# Patient Record
Sex: Male | Born: 1940
Health system: Southern US, Community
[De-identification: ages and names within clinical notes are randomized; demographics above are authoritative.]

## PROBLEM LIST (undated history)

## (undated) DIAGNOSIS — C801 Malignant (primary) neoplasm, unspecified: Secondary | ICD-10-CM

## (undated) DIAGNOSIS — R7303 Prediabetes: Secondary | ICD-10-CM

## (undated) DIAGNOSIS — F419 Anxiety disorder, unspecified: Secondary | ICD-10-CM

## (undated) DIAGNOSIS — I10 Essential (primary) hypertension: Secondary | ICD-10-CM

## (undated) DIAGNOSIS — G4730 Sleep apnea, unspecified: Secondary | ICD-10-CM

## (undated) DIAGNOSIS — F329 Major depressive disorder, single episode, unspecified: Secondary | ICD-10-CM

## (undated) DIAGNOSIS — F32A Depression, unspecified: Secondary | ICD-10-CM

## (undated) DIAGNOSIS — K279 Peptic ulcer, site unspecified, unspecified as acute or chronic, without hemorrhage or perforation: Secondary | ICD-10-CM

## (undated) DIAGNOSIS — J449 Chronic obstructive pulmonary disease, unspecified: Secondary | ICD-10-CM

## (undated) DIAGNOSIS — J309 Allergic rhinitis, unspecified: Secondary | ICD-10-CM

## (undated) DIAGNOSIS — K219 Gastro-esophageal reflux disease without esophagitis: Secondary | ICD-10-CM

## (undated) DIAGNOSIS — R0600 Dyspnea, unspecified: Secondary | ICD-10-CM

## (undated) HISTORY — PX: NASAL SEPTUM SURGERY: SHX37

## (undated) HISTORY — DX: Essential (primary) hypertension: I10

## (undated) HISTORY — DX: Allergic rhinitis, unspecified: J30.9

## (undated) HISTORY — PX: TONSILLECTOMY: SUR1361

## (undated) HISTORY — PX: CATARACT EXTRACTION, BILATERAL: SHX1313

## (undated) HISTORY — DX: Sleep apnea, unspecified: G47.30

## (undated) HISTORY — DX: Peptic ulcer, site unspecified, unspecified as acute or chronic, without hemorrhage or perforation: K27.9

## (undated) HISTORY — PX: COLONOSCOPY: SHX174

---
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
## 1999-04-12 ENCOUNTER — Ambulatory Visit (HOSPITAL_COMMUNITY): Admission: RE | Admit: 1999-04-12 | Discharge: 1999-04-12 | Payer: Self-pay | Admitting: Gastroenterology

## 1999-04-12 ENCOUNTER — Encounter (INDEPENDENT_AMBULATORY_CARE_PROVIDER_SITE_OTHER): Payer: Self-pay | Admitting: *Deleted

## 2000-08-13 ENCOUNTER — Encounter: Payer: Self-pay | Admitting: Allergy and Immunology

## 2000-08-13 ENCOUNTER — Encounter: Admission: RE | Admit: 2000-08-13 | Discharge: 2000-08-13 | Payer: Self-pay | Admitting: Allergy and Immunology

## 2001-02-06 ENCOUNTER — Encounter (INDEPENDENT_AMBULATORY_CARE_PROVIDER_SITE_OTHER): Payer: Self-pay | Admitting: *Deleted

## 2001-02-06 ENCOUNTER — Ambulatory Visit (HOSPITAL_COMMUNITY): Admission: RE | Admit: 2001-02-06 | Discharge: 2001-02-06 | Payer: Self-pay | Admitting: Gastroenterology

## 2001-03-19 ENCOUNTER — Encounter: Payer: Self-pay | Admitting: Allergy and Immunology

## 2001-03-19 ENCOUNTER — Encounter: Admission: RE | Admit: 2001-03-19 | Discharge: 2001-03-19 | Payer: Self-pay | Admitting: Allergy and Immunology

## 2003-11-30 ENCOUNTER — Emergency Department (HOSPITAL_COMMUNITY): Admission: EM | Admit: 2003-11-30 | Discharge: 2003-12-01 | Payer: Self-pay | Admitting: Emergency Medicine

## 2004-04-27 ENCOUNTER — Encounter (INDEPENDENT_AMBULATORY_CARE_PROVIDER_SITE_OTHER): Payer: Self-pay | Admitting: *Deleted

## 2004-04-27 ENCOUNTER — Ambulatory Visit (HOSPITAL_COMMUNITY): Admission: RE | Admit: 2004-04-27 | Discharge: 2004-04-27 | Payer: Self-pay | Admitting: Gastroenterology

## 2008-07-07 ENCOUNTER — Encounter: Admission: RE | Admit: 2008-07-07 | Discharge: 2008-07-07 | Payer: Self-pay | Admitting: Emergency Medicine

## 2008-07-07 IMAGING — CR DG CHEST 2V
3 series · 3 of 3 positions shown · non-contrast
Comparison: None

CLINICAL DATA: Cough, congestion, shortness of breath

CHEST - 2 VIEW

[view not recorded (1 of 3)]
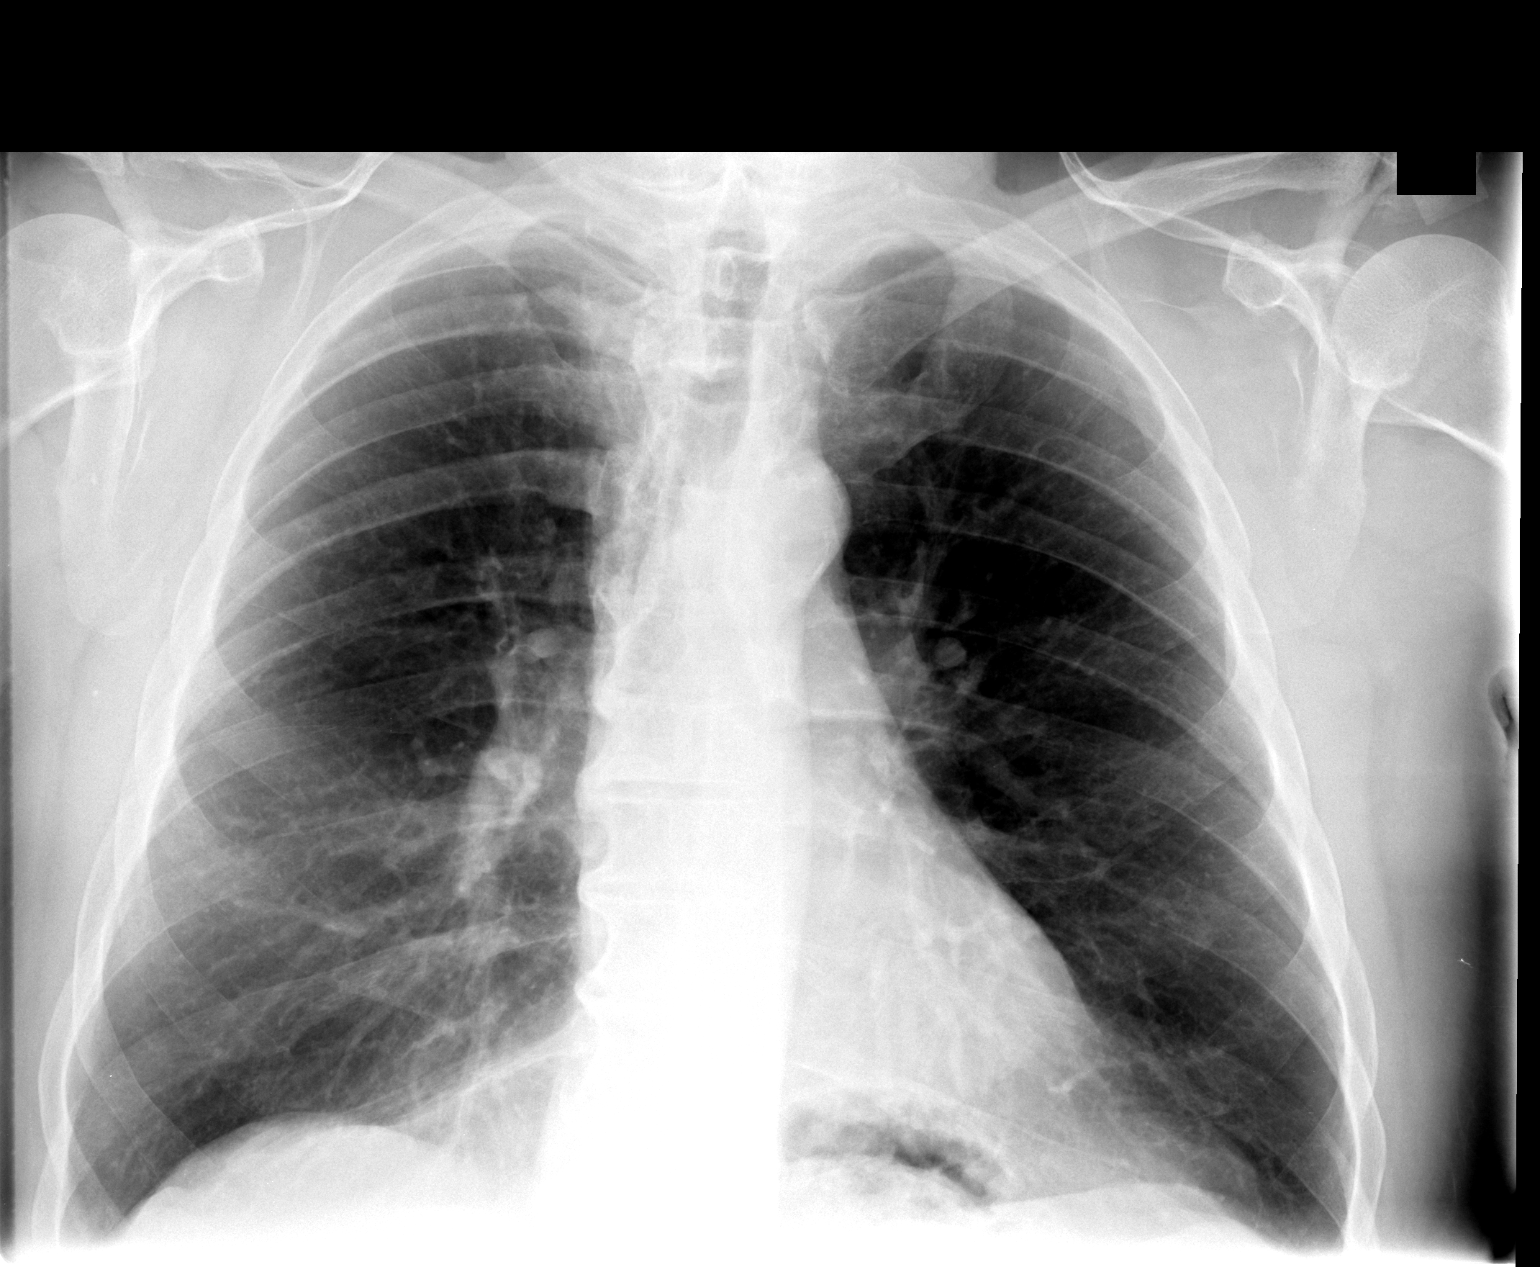

[view not recorded (2 of 3)]
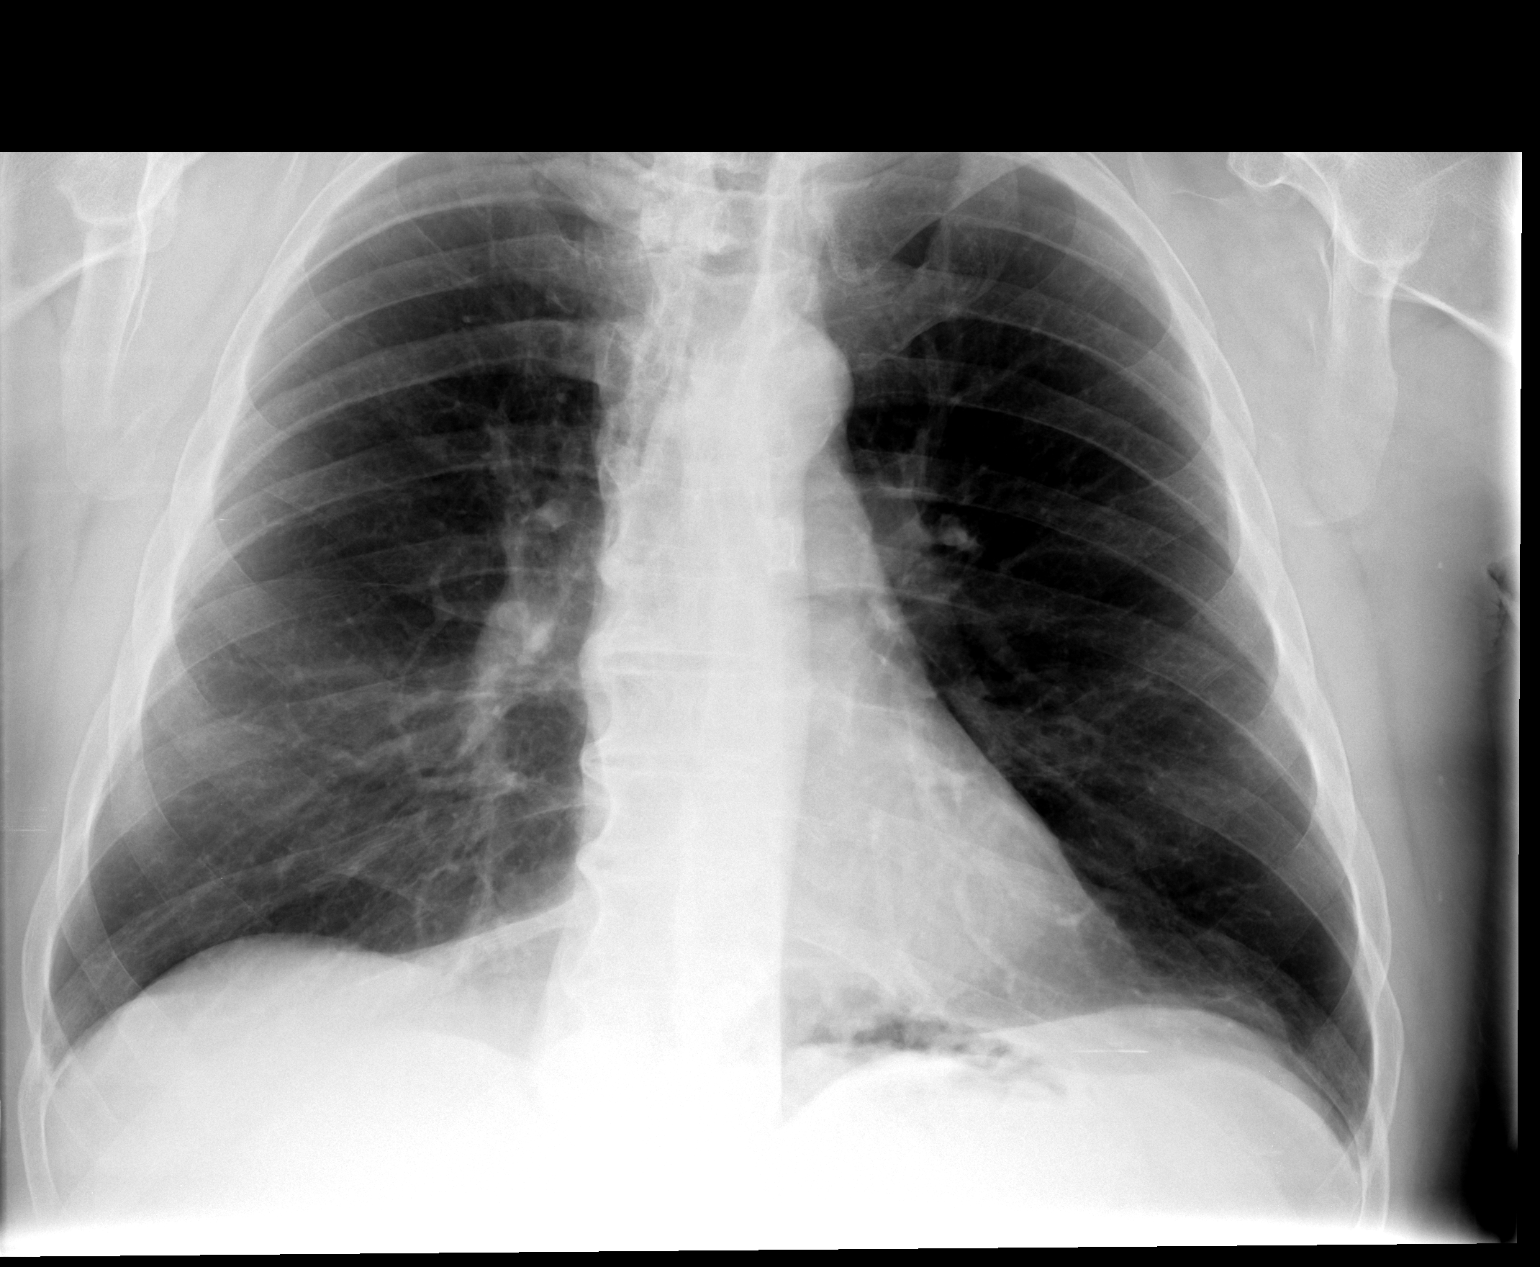

[view not recorded (3 of 3)]
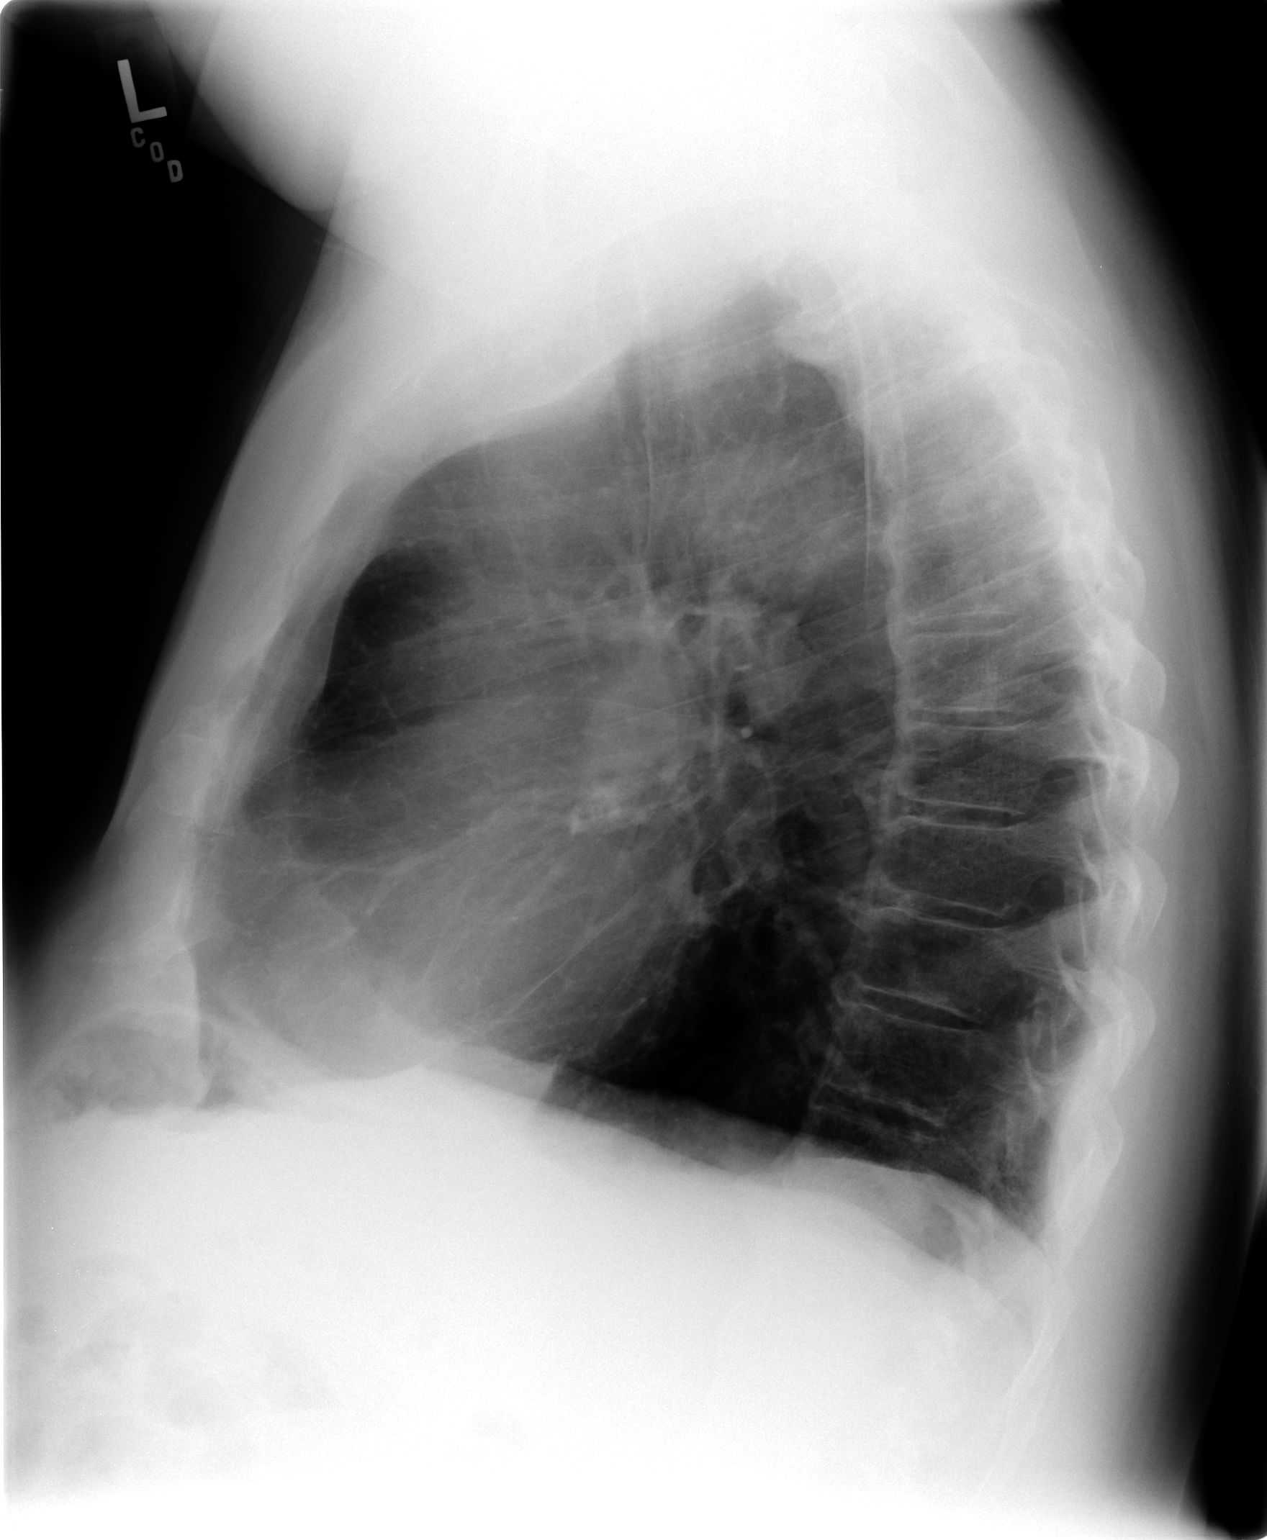

[3 of 3 positions shown; findings below may reference images not displayed]

FINDINGS: The lungs are clear but hyperaerated. The heart is within
normal limits in size. There are degenerative changes throughout
the thoracic spine.
IMPRESSION: No active lung disease. Hyperaeration.

## 2009-06-08 ENCOUNTER — Ambulatory Visit: Payer: Self-pay | Admitting: Internal Medicine

## 2009-06-08 DIAGNOSIS — J449 Chronic obstructive pulmonary disease, unspecified: Secondary | ICD-10-CM

## 2009-06-08 DIAGNOSIS — J309 Allergic rhinitis, unspecified: Secondary | ICD-10-CM | POA: Insufficient documentation

## 2009-06-08 DIAGNOSIS — I10 Essential (primary) hypertension: Secondary | ICD-10-CM | POA: Insufficient documentation

## 2009-06-08 IMAGING — CR DG CHEST 2V
2 series · 2 of 2 positions shown · non-contrast
Comparison: [DATE]

CLINICAL DATA: Asthma.  Cough, shortness of breath.

CHEST - 2 VIEW

[view not recorded (1 of 2)]
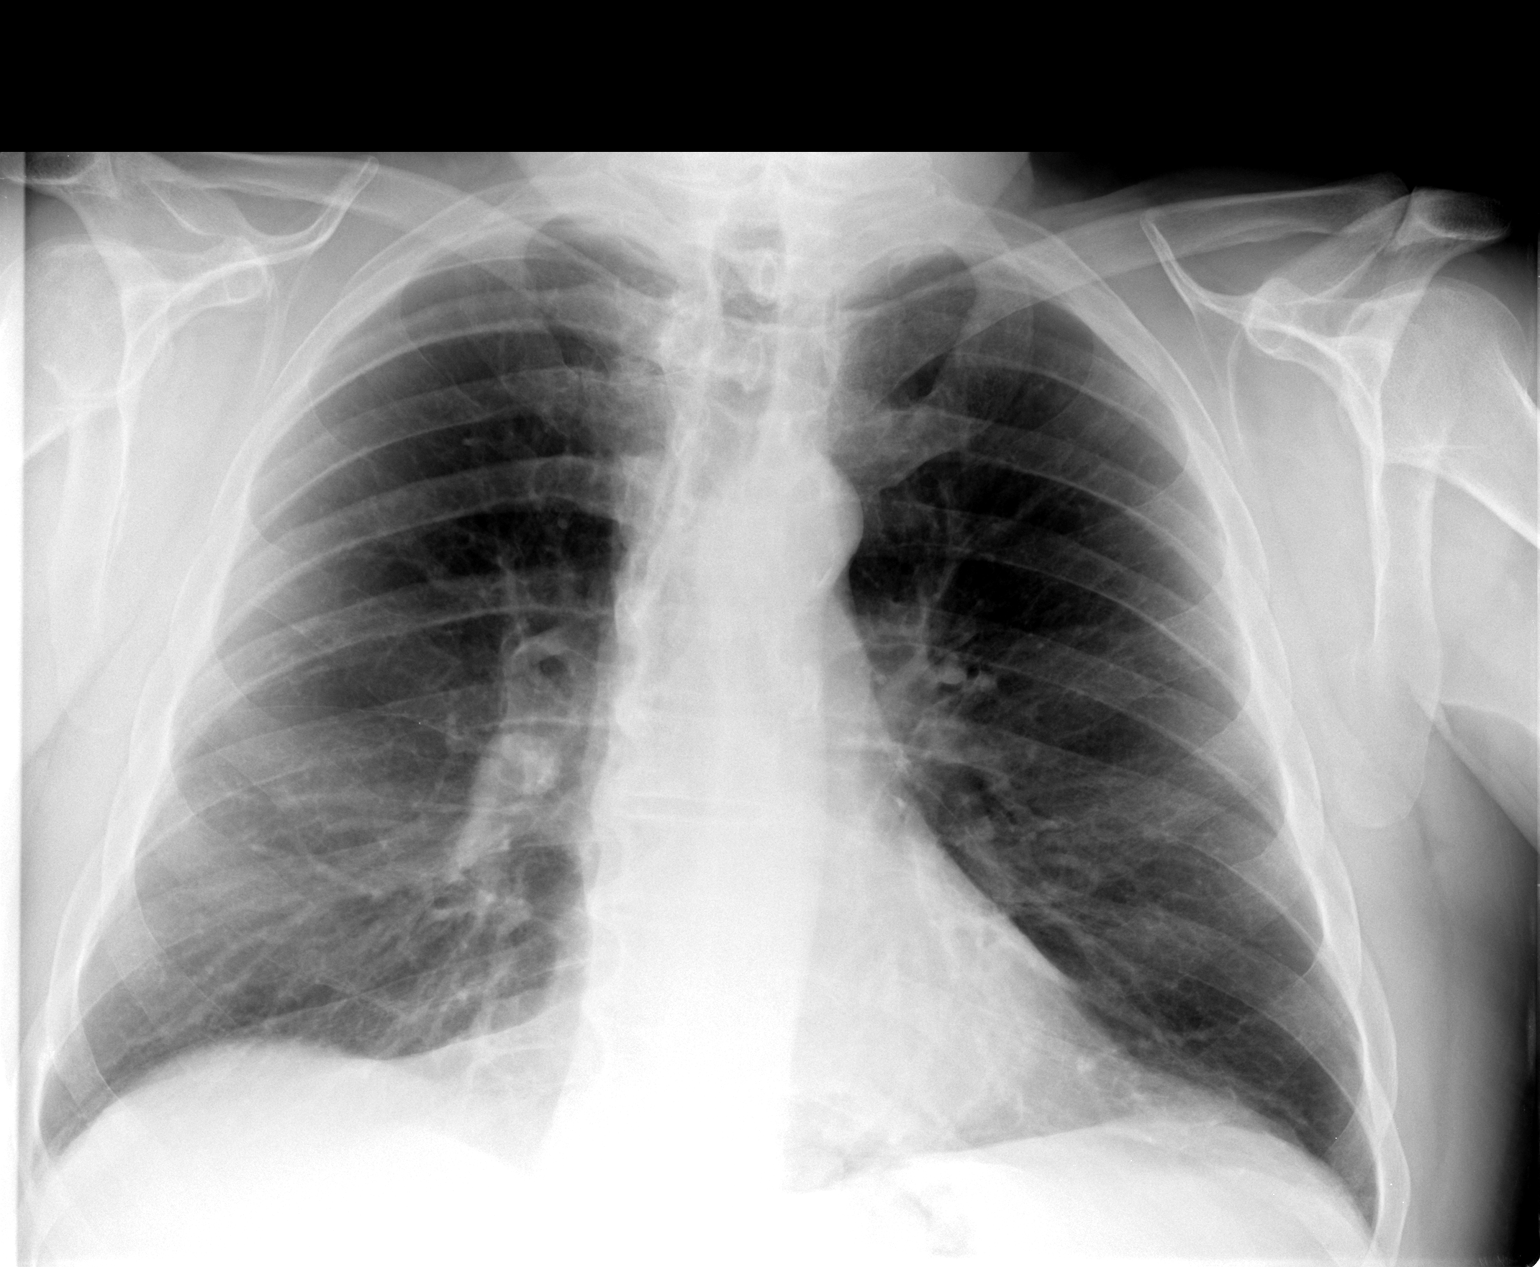

[view not recorded (2 of 2)]
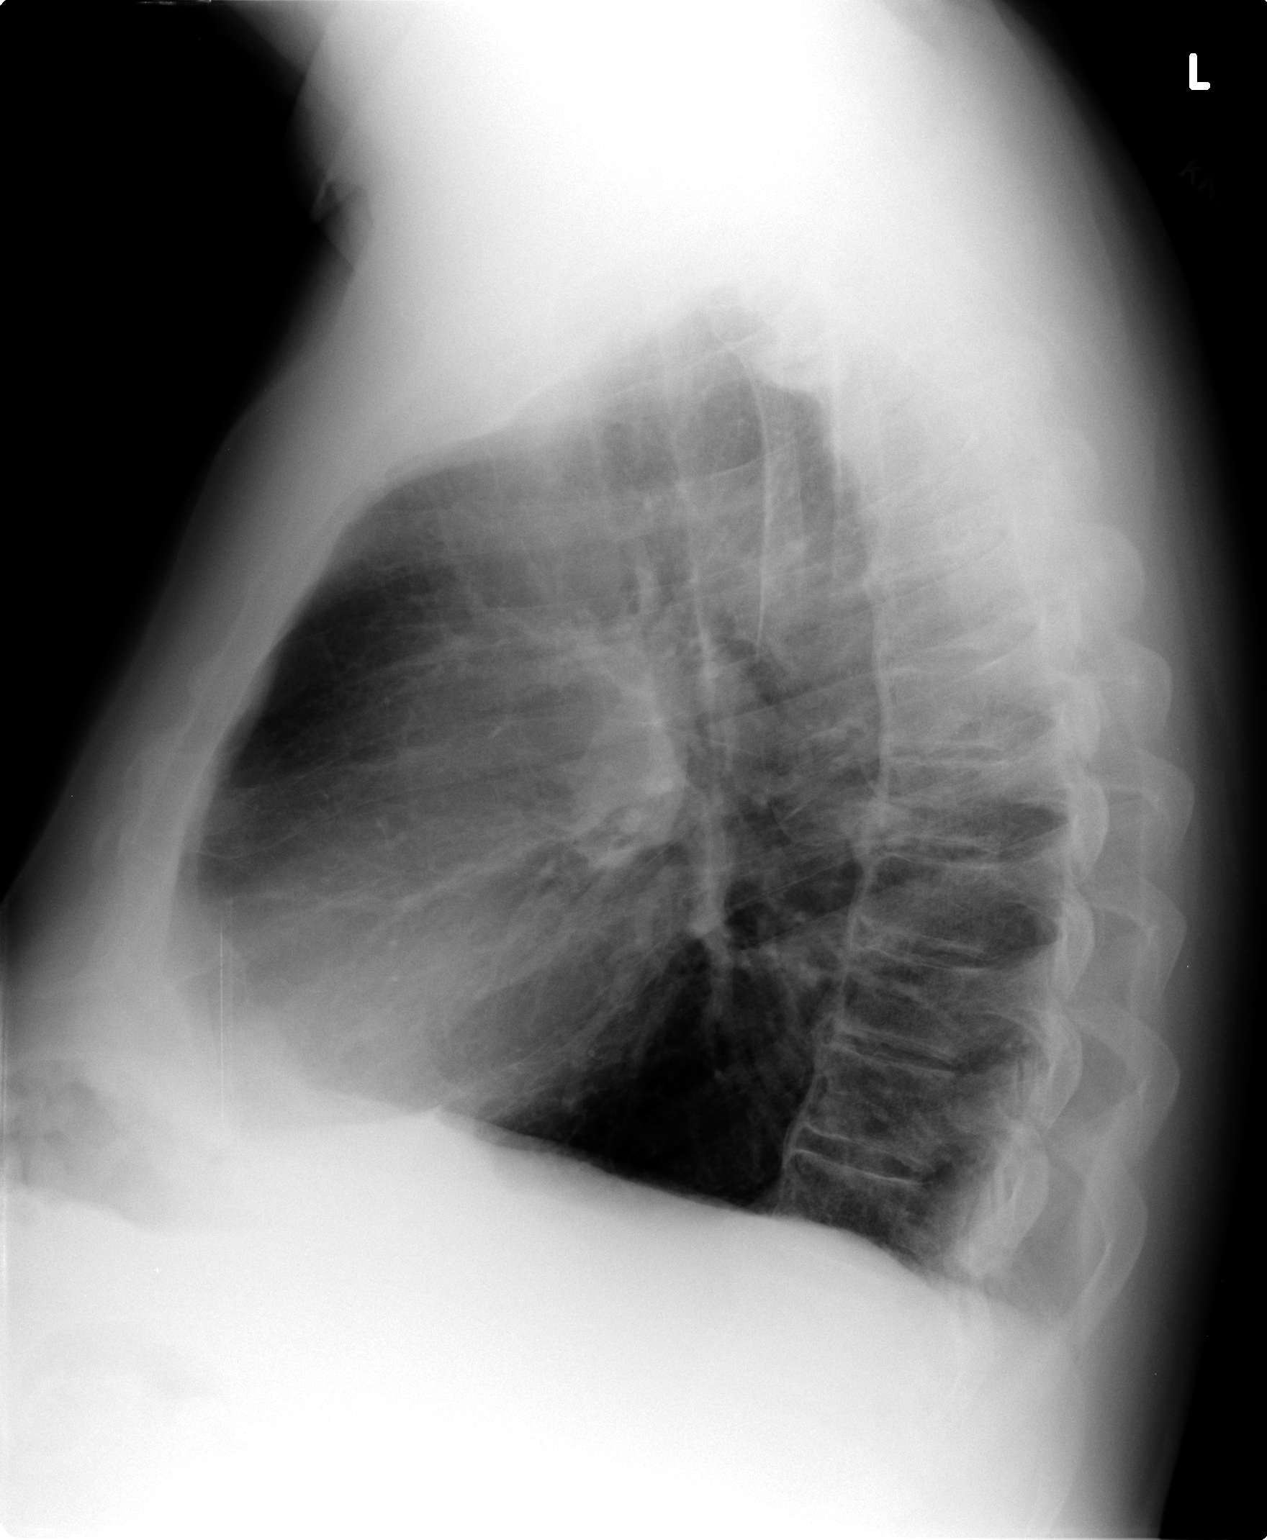

[2 of 2 positions shown; findings below may reference images not displayed]

FINDINGS: Heart and mediastinal contours are within normal limits.
No focal opacities or effusions.  No acute bony abnormality. Mild
hyperinflation, similar to prior study.
IMPRESSION: There is mild hyperinflation.  No acute findings.  No change.

## 2009-06-15 DIAGNOSIS — K279 Peptic ulcer, site unspecified, unspecified as acute or chronic, without hemorrhage or perforation: Secondary | ICD-10-CM | POA: Insufficient documentation

## 2009-07-13 ENCOUNTER — Ambulatory Visit: Payer: Self-pay | Admitting: Internal Medicine

## 2009-07-13 DIAGNOSIS — R0602 Shortness of breath: Secondary | ICD-10-CM | POA: Insufficient documentation

## 2009-07-20 DIAGNOSIS — J449 Chronic obstructive pulmonary disease, unspecified: Secondary | ICD-10-CM

## 2009-07-20 DIAGNOSIS — J4489 Other specified chronic obstructive pulmonary disease: Secondary | ICD-10-CM | POA: Insufficient documentation

## 2010-01-01 ENCOUNTER — Ambulatory Visit: Payer: Self-pay | Admitting: Internal Medicine

## 2010-01-01 DIAGNOSIS — F17201 Nicotine dependence, unspecified, in remission: Secondary | ICD-10-CM | POA: Insufficient documentation

## 2010-01-01 DIAGNOSIS — L509 Urticaria, unspecified: Secondary | ICD-10-CM | POA: Insufficient documentation

## 2010-03-15 ENCOUNTER — Encounter: Admission: RE | Admit: 2010-03-15 | Discharge: 2010-03-15 | Payer: Self-pay | Admitting: Family Medicine

## 2010-03-15 IMAGING — CR DG CHEST 2V
2 series · 2 of 2 positions shown · non-contrast
Comparison: Chest x-ray of [DATE]

CLINICAL DATA: Cough for several months, smoking history

CHEST - 2 VIEW

[view not recorded (1 of 2)]
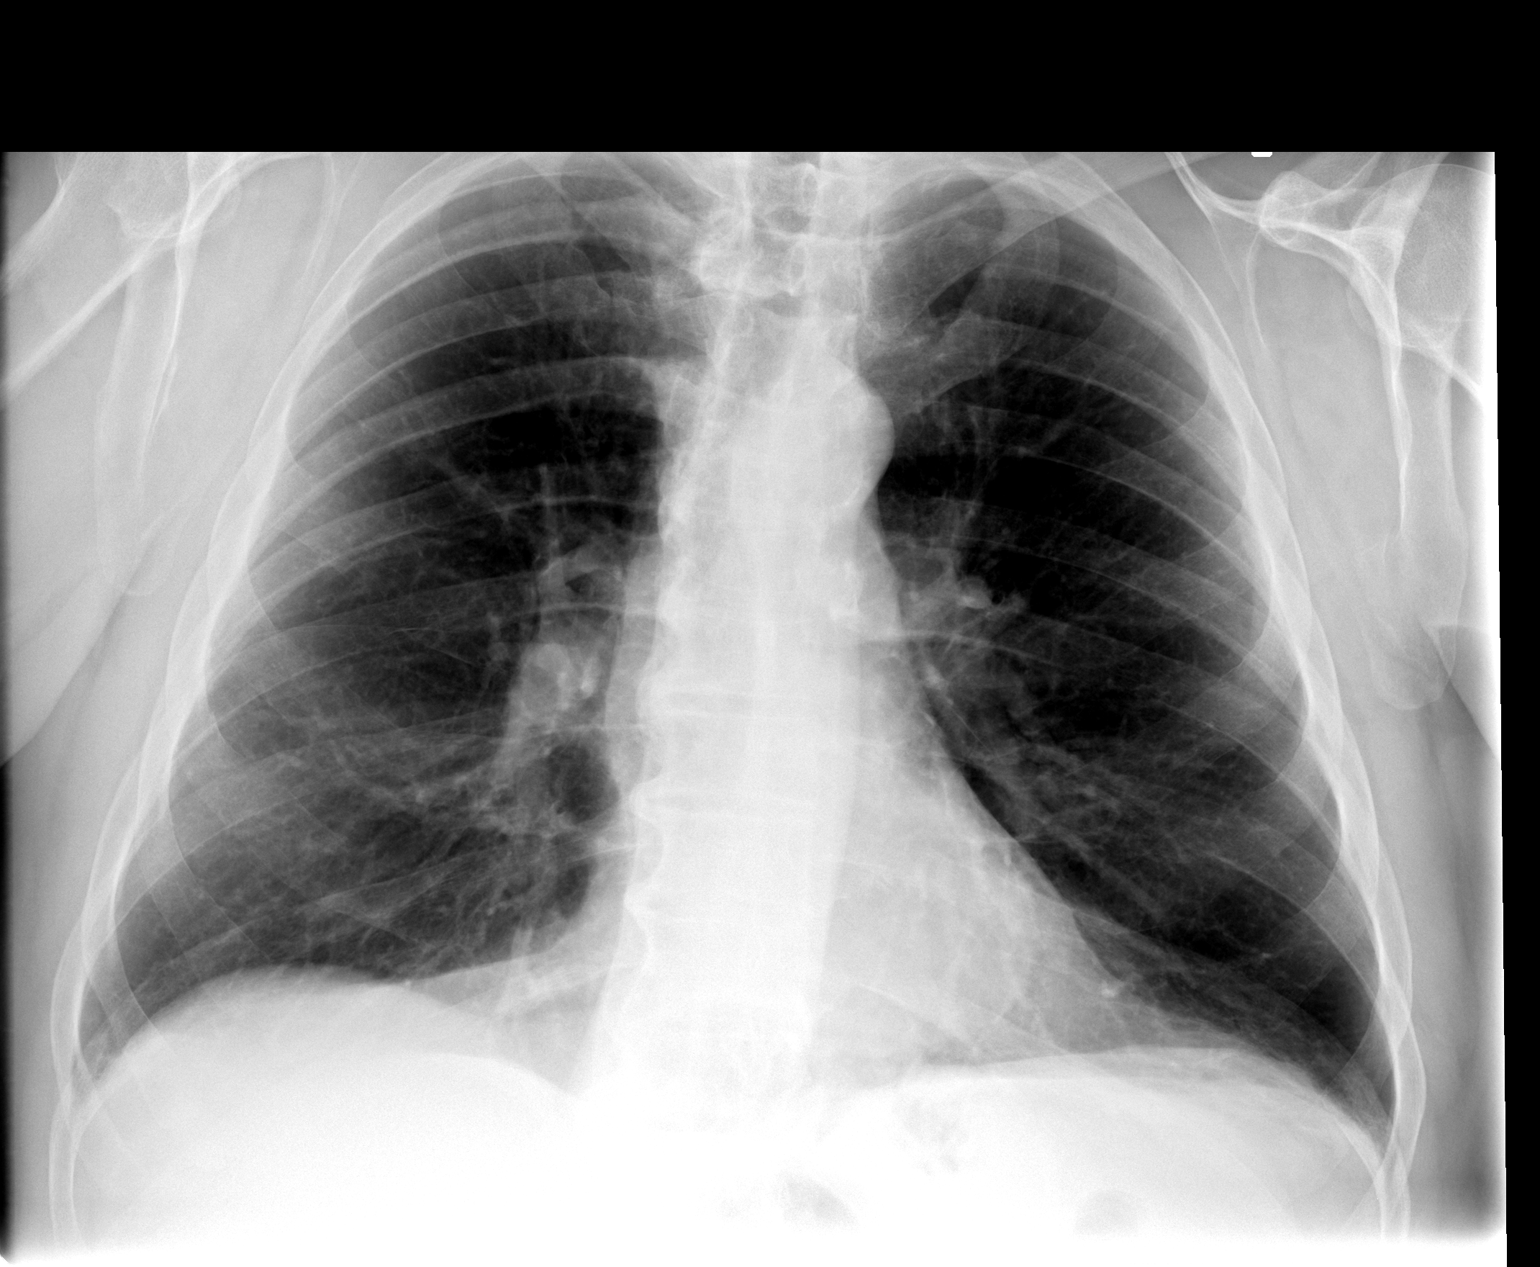

[view not recorded (2 of 2)]
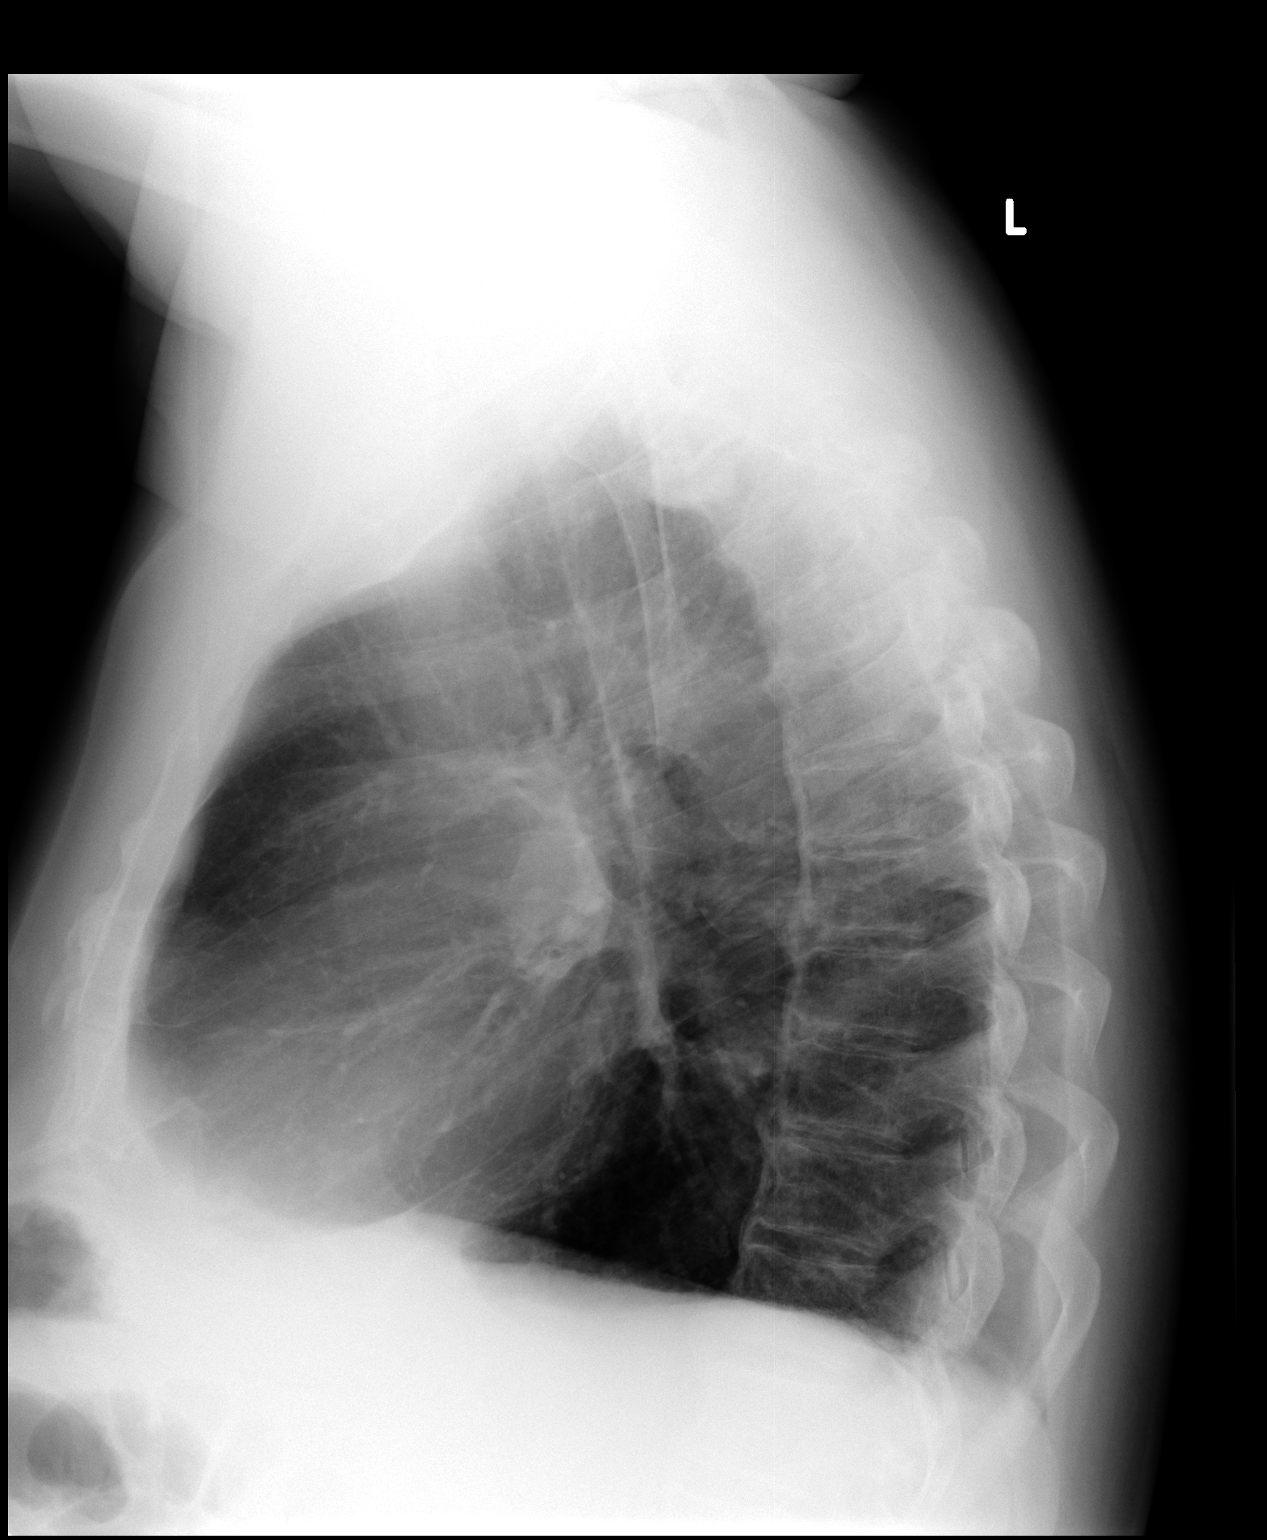

[2 of 2 positions shown; findings below may reference images not displayed]

FINDINGS: The lungs are clear but hyperaerated suggestive of COPD.
The heart is within upper limits of normal.  There are degenerative
changes diffusely throughout the thoracic spine.
IMPRESSION: Hyperaeration suggesting COPD.  No active lung disease.

## 2010-05-22 ENCOUNTER — Ambulatory Visit (HOSPITAL_COMMUNITY)
Admission: RE | Admit: 2010-05-22 | Discharge: 2010-05-22 | Payer: Self-pay | Source: Home / Self Care | Attending: General Surgery | Admitting: General Surgery

## 2010-05-28 LAB — CBC
HCT: 41.3 % (ref 39.0–52.0)
Hemoglobin: 14 g/dL (ref 13.0–17.0)
MCH: 33.1 pg (ref 26.0–34.0)
MCHC: 33.9 g/dL (ref 30.0–36.0)
MCV: 97.6 fL (ref 78.0–100.0)
Platelets: 213 10*3/uL (ref 150–400)
RBC: 4.23 MIL/uL (ref 4.22–5.81)
RDW: 12.6 % (ref 11.5–15.5)
WBC: 4.9 10*3/uL (ref 4.0–10.5)

## 2010-05-28 LAB — DIFFERENTIAL
Basophils Absolute: 0 10*3/uL (ref 0.0–0.1)
Basophils Relative: 1 % (ref 0–1)
Eosinophils Absolute: 0.2 10*3/uL (ref 0.0–0.7)
Eosinophils Relative: 4 % (ref 0–5)
Lymphocytes Relative: 27 % (ref 12–46)
Lymphs Abs: 1.3 10*3/uL (ref 0.7–4.0)
Monocytes Absolute: 0.4 10*3/uL (ref 0.1–1.0)
Monocytes Relative: 8 % (ref 3–12)
Neutro Abs: 3 10*3/uL (ref 1.7–7.7)
Neutrophils Relative %: 60 % (ref 43–77)

## 2010-05-28 LAB — URINALYSIS, ROUTINE W REFLEX MICROSCOPIC
Bilirubin Urine: NEGATIVE
Hgb urine dipstick: NEGATIVE
Ketones, ur: NEGATIVE mg/dL
Nitrite: NEGATIVE
Protein, ur: NEGATIVE mg/dL
Urobilinogen, UA: 0.2 mg/dL (ref 0.0–1.0)
pH: 6 (ref 5.0–8.0)

## 2010-05-28 LAB — SURGICAL PCR SCREEN
MRSA, PCR: NEGATIVE
Staphylococcus aureus: NEGATIVE

## 2010-05-28 LAB — COMPREHENSIVE METABOLIC PANEL
ALT: 21 U/L (ref 0–53)
AST: 24 U/L (ref 0–37)
Albumin: 4.1 g/dL (ref 3.5–5.2)
Alkaline Phosphatase: 62 U/L (ref 39–117)
BUN: 19 mg/dL (ref 6–23)
CO2: 28 mEq/L (ref 19–32)
Calcium: 9.3 mg/dL (ref 8.4–10.5)
Chloride: 103 mEq/L (ref 96–112)
GFR calc Af Amer: >60 mL/min (ref >60)
GFR calc non Af Amer: >60 mL/min (ref >60)
Glucose, Bld: 121 mg/dL — ABNORMAL HIGH (ref 70–99)
Potassium: 4.2 mEq/L (ref 3.5–5.1)
Sodium: 139 mEq/L (ref 135–145)
Total Protein: 7.1 g/dL (ref 6.0–8.3)

## 2010-06-11 ENCOUNTER — Encounter: Payer: Self-pay | Admitting: Internal Medicine

## 2010-06-14 NOTE — Miscellaneous (Signed)
Summary: Orders Update pft charges  Clinical Lists Changes  Orders: Added new Service order of Carbon Monoxide diffusing w/capacity (94720) - Signed Added new Service order of Lung Volumes (94240) - Signed Added new Service order of Spirometry (Pre & Post) (94060) - Signed 

## 2010-06-14 NOTE — Assessment & Plan Note (Signed)
Summary: SIX MIN WALK- PULM STRESS TEST  Nurse Visit   Vital Signs:  Patient profile:   70 year old male Pulse rate:   74 / minute BP sitting:   130 / 76  Medications Prior to Update: 1)  Diovan 160 Mg Tabs (Valsartan) .[name].. 1 Daily 2)  Omeprazole 20 Mg Cpdr (Omeprazole) .... Take 1 Tablet By Mouth Once A Day 3)  Citalopram Hydrobromide 40 Mg Tabs (Citalopram Hydrobromide) .... Take 1 Tablet By Mouth Once A Day 4)  Advair Diskus 500-50 Mcg/dose Aepb (Fluticasone-Salmeterol) .... One Puff Twice Daily 5)  Spiriva Handihaler 18 Mcg Caps (Tiotropium Bromide Monohydrate) .... Once Daily 6)  Proventil Hfa 108 (90 Base) Mcg/act Aers (Albuterol Sulfate) .... As Needed 7)  Fluticasone Propionate 50 Mcg/act Susp (Fluticasone Propionate) .... 2 Sprays in Each Nostril Daily 8)  Zyrtec Allergy 10 Mg Tabs (Cetirizine Hcl) .... Take 1 Tablet By Mouth Once A Day 9)  Zantac 150 Mg Tabs (Ranitidine Hcl) .... Take 1 Tablet By Mouth Once A Day 10)  Tylenol Arthritis Pain 650 Mg Cr-Tabs (Acetaminophen) .... 4 Tabs Daily 11)  Vitamin D .... 800 Units Daily  Allergies: 1)  ! Asa  Orders Added: 1)  Pulmonary Stress (6 min walk) [94620]   Six Minute Walk Test Medications taken before test(dose and time): NONE TAKEN TODAY PER PT. Supplemental oxygen during the test: No  Lap counter(place a tick mark inside a square for each lap completed) lap 1 complete  lap 2 complete   lap 3 complete   lap 4 complete  lap 5 complete  lap 6 complete  lap 7 complete   lap 8 complete    Baseline  BP sitting: 130/ 76 Heart rate: 74 Dyspnea ( Borg scale) 3 Fatigue (Borg scale) 0 SPO2 95  End Of Test  BP sitting: 140/ 80 Heart rate: 95 Dyspnea ( Borg scale) 3 Fatigue (Borg scale) 0 SPO2 96  2 Minutes post  BP sitting: 140/ 78 Heart rate: 79 SPO2 97  Stopped or paused before six minutes? No  Interpretation: Number of laps  8 X 48 meters =   384 meters+ final partial lap: 30 meters =    414 meters     Total distance walked in six minutes: 414 meters  Tech ID: Tivis Ringer, CNA (July 13, 2009 9:59 AM) Tech Comments PT COMPLETED TEST W/ 0 REST BREAKS AND 0 COMPLAINTS.

## 2010-06-14 NOTE — Assessment & Plan Note (Signed)
Summary: rov after smw/ pft ///kp   Copy to:  self referral Primary Provider/Referring Provider:  Dr. Lajean Manes, [facility]  CC:  Follow up visit after PFT and .[name]  History of Present Illness: 2009-06-29- 68 yoM self referred because of cough and dyspnea. I had seen twice at old office for allergy problems many years ago. He describes perennial nasal and chest congestion going back 20 years. An inhaler and Advair helped for a while. He had been working withDr Georgina Pillion who added Spiriva a year ago. Coughing clear white with no blood. He never feels clear for long and exertional dyspnea is bothersome. Has had pneumonia and pneumovax. Was skin tested for allergies, but no vaccine. Has had recurrent urticaria over years. Hx of bleeding ulcer but denies anemia. Smokes 1 pack per month.  July 13, 2009- asthma/ rhinitis. dyspnea No cigarettes in a week!- congratulated and encouraged. Feels well sith no acute changes. He is using Spiriva as needed to save cost, but still using the Advair 500. Change from Lisinopril to Diovan "was 95% of my coughing". Comes for PFT- severe obstruction with response to BD, FEV11.56, FEV1/FVC 0.49, airtrapping, Nl DLCO - 95. 96, 97%, 414 meters  Current Medications (verified): 1)  Diovan 160 Mg Tabs (Valsartan) .[name].. 1 Daily 2)  Omeprazole 20 Mg Cpdr (Omeprazole) .... Take 1 Tablet By Mouth Once A Day 3)  Citalopram Hydrobromide 40 Mg Tabs (Citalopram Hydrobromide) .... Take 1 Tablet By Mouth Once A Day 4)  Advair Diskus 500-50 Mcg/dose Aepb (Fluticasone-Salmeterol) .... One Puff Twice Daily 5)  Spiriva Handihaler 18 Mcg Caps (Tiotropium Bromide Monohydrate) .... Once Daily 6)  Proventil Hfa 108 (90 Base) Mcg/act Aers (Albuterol Sulfate) .... As Needed 7)  Fluticasone Propionate 50 Mcg/act Susp (Fluticasone Propionate) .... 2 Sprays in Each Nostril Daily 8)  Zyrtec Allergy 10 Mg Tabs (Cetirizine Hcl) .... Take 1 Tablet By Mouth Once A Day 9)   Zantac 150 Mg Tabs (Ranitidine Hcl) .... Take 1 Tablet By Mouth Once A Day 10)  Tylenol Arthritis Pain 650 Mg Cr-Tabs (Acetaminophen) .... 4 Tabs Daily 11)  Vitamin D .... 800 Units Daily  Allergies (verified): 1)  ! 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  The patient denies anorexia, fever, weight loss, weight gain, vision loss, decreased hearing, hoarseness, chest pain, syncope, peripheral edema, prolonged cough, headaches, hemoptysis, abdominal pain, and severe indigestion/heartburn.    Vital Signs:  Patient profile:   70 year old male Height:      66 inches Weight:      208 pounds BMI:     33.69 O2 Sat:      96 % on Room air Pulse rate:   66 / minute BP sitting:   160 / 76  (left arm) Cuff size:   regular  Vitals Entered By: Reynaldo Minium CMA (July 13, 2009 11:17 AM)  O2 Flow:  Room air  Physical Exam  Additional Exam:  General: A/Ox3; pleasant and cooperative, NAD, overweight SKIN: no rash, lesions NODES: no lymphadenopathy HEENT: /AT, EOM- WNL, Conjuctivae- clear, PERRLA, TM-WNL, Nose- mucus bridging, Throat- clear and wnl, Mellampatti  III NECK: Supple w/ fair ROM, JVD- none, normal carotid impulses w/o bruits Thyroid-  CHEST: Clear to P&A, diminished, mild expiratory wheeze right base HEART: RRR, no m/g/r heard ABDOMEN: Soft and nl;  ZOX:WRUE, nl pulses, no edema  NEURO: Grossly intact to  observation      Impression & Recommendations:  Problem # 1:  ASTHMA (ICD-493.90) Asthma/ COPD with fixed and reversible components. We would like to reduce Advair strength to 250 I will give diovan script for now since lisinopril caused cough, but I want his primary physician to manage BP. He is strongly encouraged to stay off the cigarettes. Hopefully this and some weight loss will substantially improve dyspnea with time.  Medications Added to Medication List This Visit: 1)  Advair Diskus 250-50 Mcg/dose Aepb (Fluticasone-salmeterol) .[name].. 1 puff and rinse mouth twice daily  Other Orders: Est. Patient Level III (45409)  Patient Instructions: 1)  Please schedule a follow-up appointment in 6 months. 2)  You are doing great with the smoking- good for you! 3)  Sample and script for Advair 250/50- 1 puff and rinse mouth twice daily 4)  There should be a script for  diovan at your drug store.  5)  You should go ahead with an appointment to establish with your new primary doc. You can stay at the same practice if you want since your records are there. We will want them to manage your BP long term. Prescriptions: ADVAIR DISKUS 250-50 MCG/DOSE AEPB (FLUTICASONE-SALMETEROL) 1 puff and rinse mouth twice daily  #1 x prn   Entered by:   Reynaldo Minium CMA   Authorized by:   Waymon Budge MD   Signed by:   Reynaldo Minium CMA on 07/13/2009   Method used:   Electronically to        [facility] [address]* (retail)       [address]       [place], [place]  [address]       Ph: [phone] or [phone]       Fax: [phone]   RxID:   [id number] ADVAIR DISKUS 250-50 MCG/DOSE AEPB (FLUTICASONE-SALMETEROL) 1 puff and rinse mouth twice daily  #1 x prn   Entered and Authorized by:   Waymon Budge MD   Signed by:   Waymon Budge MD on 07/13/2009   Method used:   Print then Give to Patient   RxID:   [id number]  Pt requested Rx be sent to pharmacy instead  of taking Rx.Reynaldo Minium CMA  July 13, 2009 12:10 PM

## 2010-06-14 NOTE — Assessment & Plan Note (Signed)
Summary: chest congestion/ cough/ mbw   Copy to:  self referral Primary Provider/Referring Provider:  Dr. Lajean Manes, [facility]  CC:  Pulmonary consult fpr persistent cough and nasal congestion. Cough is productive with phlegm that changes from clear to creamy in color x years. .  History of Present Illness: 2009/06/09- 68 yoM self referred because of cough and dyspnea. I had seen twice at old office for allergy problems many years ago. He describes perennial nasal and chest congestion going back 20 years. An inhaler and Advair helped for a while. He had been working withDr Georgina Pillion who added Spiriva a year ago. Coughing clear white with no blood. He never feels clear for long and exertional dyspnea is bothersome. Has had pneumonia and pneumovax. Was skin tested for allergies, but no vaccine. Has had recurrent urticaria over years. Hx of bleeding ulcer but denies anemia. Smokes 1 pack per month.  Current Medications (verified): 1)  Diovan 160 Mg Tabs (Valsartan) .[name].. 1 Daily 2)  Omeprazole 20 Mg Cpdr (Omeprazole) .... Take 1 Tablet By Mouth Once A Day 3)  Citalopram Hydrobromide 40 Mg Tabs (Citalopram Hydrobromide) .... Take 1 Tablet By Mouth Once A Day 4)  Advair Diskus 500-50 Mcg/dose Aepb (Fluticasone-Salmeterol) .... One Puff Twice Daily 5)  Spiriva Handihaler 18 Mcg Caps (Tiotropium Bromide Monohydrate) .... Once Daily 6)  Proventil Hfa 108 (90 Base) Mcg/act Aers (Albuterol Sulfate) .... As Needed 7)  Fluticasone Propionate 50 Mcg/act Susp (Fluticasone Propionate) .... 2 Sprays in Each Nostril Daily 8)  Zyrtec Allergy 10 Mg Tabs (Cetirizine Hcl) .... Take 1 Tablet By Mouth Once A Day 9)  Zantac 150 Mg Tabs (Ranitidine Hcl) .... Take 1 Tablet By Mouth Once A Day 10)  Tylenol Arthritis Pain 650 Mg Cr-Tabs (Acetaminophen) .... 4 Tabs Daily 11)  Vitamin D .... 800 Units Daily  Allergies (verified): 1)  ! 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 ETOH-2 drinks a day  Past Medical History: Allergic Rhinitis Asthma Hypertension P U D- avoids aspirin  Past Surgical History: Deviated Septum 1960's  Family History: Father- died suicide/ ETOH,heart disease, had bypass Mother- living age 71, hx breast ca several maternal uncles-prostate cancer  Social History: Married, 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         anxiety/ depression denies edema painful cramps relieved with vinegar  Vital Signs:  Patient profile:   70 year old male Height:      66 inches Weight:      205.50 pounds BMI:     33.29 O2 Sat:      92 % on Room air Pulse rate:   75 / minute BP sitting:   120 / 82  (right arm) Cuff size:   regular  Vitals Entered By: Carron Curie CMA (06/09/09 9:43 AM)  O2 Flow:  Room air CC: Pulmonary consult fpr persistent cough, nasal congestion. Cough is productive with phlegm that changes from clear to creamy in color x years.  Comments Medications reviewed with patient Carron Curie CMA  June 08, 2009 9:44 AM Daytime phone number verified with patient.    Physical Exam  Additional Exam:  General: A/Ox3; pleasant and cooperative, NAD, overweight SKIN: no rash, lesions NODES: no lymphadenopathy HEENT: [place]/AT, EOM- WNL, Conjuctivae- clear, PERRLA, TM-WNL, Nose- mucus bridging, Throat- clear and wnl, Mellampatti  III NECK: Supple w/ fair  ROM, JVD- none, normal carotid impulses w/o bruits Thyroid- normal to palpation CHEST: Clear to P&A, diminished HEART: RRR, no m/g/r heard ABDOMEN: Soft and nl; nml bowel sounds; no organomegaly or masses noted NWG:NFAO, nl pulses, no edema  NEURO: Grossly intact to observation      Impression & Recommendations:  Problem # 1:  ASTHMA (ICD-493.90) I suspect COPD. 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 I emphasized smoking cessation. We will get PFT and CXR. He asks permission to try off spiriva in hopes of reducing meds. I am concerned about Lisinopeil with hx of cough and hives. We will replace with Diovan samples.  Problem # 2:  ALLERGIC RHINITIS (ICD-477.9)  Perennial rhinitis. There has been an allergic component but how important that is now is unclear.   Orders: New Patient Level III (13086)  His updated medication list for this problem includes:    Fluticasone Propionate 50 Mcg/act Susp (Fluticasone propionate) .[name]... 2 sprays in each nostril daily    Zyrtec Allergy 10 Mg Tabs (Cetirizine hcl) .[name]... Take 1 tablet by mouth once a day  Medications Added to Medication List This Visit: 1)  Diovan 160 Mg Tabs (Valsartan) .[name].. 1 daily 2)  Omeprazole 20 Mg Cpdr (Omeprazole) .... Take 1 tablet by mouth once a day 3)  Citalopram Hydrobromide 40 Mg Tabs (Citalopram hydrobromide) .... Take 1 tablet by mouth once a day 4)  Advair Diskus 500-50 Mcg/dose Aepb (Fluticasone-salmeterol) .... One puff twice daily 5)  Spiriva Handihaler 18 Mcg Caps (Tiotropium bromide monohydrate) .... Once daily 6)  Proventil Hfa 108 (90 Base) Mcg/act Aers (Albuterol sulfate) .... As needed 7)  Fluticasone Propionate 50 Mcg/act Susp (Fluticasone propionate) .... 2 sprays in each nostril daily 8)  Zyrtec Allergy 10 Mg Tabs (Cetirizine hcl) .... Take 1 tablet by mouth once a day 9)  Zantac 150 Mg Tabs (Ranitidine hcl) .... Take 1 tablet by  mouth once a day 10)  Tylenol Arthritis Pain 650 Mg Cr-tabs (Acetaminophen) .... 4 tabs daily 11)  Vitamin D  .... 800 units daily  Other Orders: T-2 View CXR (71020TC)  Patient Instructions: 1)  Please schedule a follow-up appointment in 1 month. 2)  Replace Lisinopril with samples/ script for Diovan 160 mg, once daily. 3)  Ok to try off Spiriva. You can go back on it from time to time as needed, even if you don't find you need it all the time. 4)  Weight loss and smoking cessation will really help the shortness of breath. 5)  Schedule PFT with 6 [facility] 6)  A chest x-ray has been recommended.  Your imaging study may require preauthorization.  Prescriptions: DIOVAN 160 MG TABS (VALSARTAN) 1 daily  #30 x prn   Entered and Authorized by:   Waymon Budge MD   Signed by:   Waymon Budge MD on 06/08/2009   Method used:   Print then Give to Patient   RxID:   [id number]

## 2010-06-14 NOTE — Assessment & Plan Note (Signed)
Summary: 6 months/apc   Copy to:  self referral Primary Conard Alvira/Referring Carolyne Whitsel:  Dr. Lajean Manes, [facility]  CC:  6 month follow up visit-asthma-no attacks; COPD.[name]  History of Present Illness: June 08, 2009- 68 yoM self referred because of cough and dyspnea. I had seen twice at old office for allergy problems many years ago. He describes perennial nasal and chest congestion going back 20 years. An inhaler and Advair helped for a while. He had been working withDr Georgina Pillion who added Spiriva a year ago. Coughing clear white with no blood. He never feels clear for long and exertional dyspnea is bothersome. Has had pneumonia and pneumovax. Was skin tested for allergies, but no vaccine. Has had recurrent urticaria over years. Hx of bleeding ulcer but denies anemia. Smokes 1 pack per month.  July 13, 2009- asthma/ rhinitis. dyspnea No cigarettes in a week!- congratulated and encouraged. Feels well sith no acute changes. He is using Spiriva as needed to save cost, but still using the Advair 500. Change from Lisinopril to Diovan "was 95% of my coughing". Comes for PFT- severe obstruction with response to BD, FEV11.56, FEV1/FVC 0.49, airtrapping, Nl DLCO - 95. 96, 97%, 414 meters  January 01, 2010- Asthma rhinitis, dyspnea, Urticaria Had to workj harder to breathe during hottest days this summer. 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 No specific attacks and hasn't needed to use rescue inhaler in months. Not using fluticasone. Nasal saline every night does well. Uses Spiriva in spells as needed. Daily Advair.  Had urticaria over a year ago. 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 Tries to stay off cigs. Smokes a few ciggarettes in binges every few weeks.   Asthma History    Initial Asthma Severity Rating:    Age range: 12+ years    Symptoms: 0-2 days/week    Nighttime Awakenings:  0-2/month    Interferes w/ normal activity: minor limitations    SABA use (not for EIB): 0-2 days/week    Asthma Severity Assessment: Mild Persistent   Preventive Screening-Counseling & Management  Alcohol-Tobacco     Smoking Status: current-attempting to quit     Smoke Cessation Stage: ready     Packs/Day: <0.25     Tobacco Counseling: to quit use of tobacco products  Current Medications (verified): 1)  Diovan 160 Mg Tabs (Valsartan) .... Take 1 By Mouth Once Daily 2)  Omeprazole 20 Mg Cpdr (Omeprazole) .... Take 1 Tablet By Mouth Once A Day 3)  Citalopram Hydrobromide 40 Mg Tabs (Citalopram Hydrobromide) .... Take 1 Tablet By Mouth Once A Day 4)  Advair Diskus 250-50 Mcg/dose Aepb (Fluticasone-Salmeterol) .[name].. 1 Puff and Rinse Mouth Twice Daily 5)  Spiriva Handihaler 18 Mcg Caps (Tiotropium Bromide Monohydrate) .... Once Daily 6)  Proventil Hfa 108 (90 Base) Mcg/act Aers (Albuterol Sulfate) .... As Needed 7)  Fluticasone Propionate 50 Mcg/act Susp (Fluticasone Propionate) .... 2 Sprays in Each Nostril Daily 8)  Zyrtec Allergy 10 Mg Tabs (Cetirizine Hcl) .... Take 1 Tablet By Mouth Once A Day 9)  Zantac 150 Mg Tabs (Ranitidine Hcl) .... Take 1 Tablet By Mouth Once A Day 10)  Tylenol Arthritis Pain 650 Mg Cr-Tabs (Acetaminophen) .... 4 Tabs Daily 11)  Vitamin D .... 800 Units Daily  Allergies (verified): 1)  ! Asa  Social History: Smoking Status:  current-attempting to quit Packs/Day:  <0.25  Review of Systems  See HPI  The patient denies shortness of breath with activity, shortness of breath at rest, productive cough, non-productive cough, coughing up blood, chest pain, irregular heartbeats, acid heartburn, indigestion, loss of appetite, weight change, abdominal pain, difficulty swallowing, sore throat, tooth/dental problems, headaches, nasal congestion/difficulty breathing through nose, sneezing, itching, and ear ache.    Vital Signs:  Patient profile:   70 year old  male Height:      66 inches Weight:      206.13 pounds BMI:     33.39 O2 Sat:      94 % on Room air Pulse rate:   72 / minute BP sitting:   120 / 60  (left arm) Cuff size:   regular  Vitals Entered By: Reynaldo Minium CMA (January 01, 2010 2:04 PM)  O2 Flow:  Room air CC: 6 month follow up visit-asthma-no attacks; COPD.   Physical Exam  Additional Exam:  General: A/Ox3; pleasant and cooperative, NAD, overweight SKIN: no rash, lesions NODES: no lymphadenopathy HEENT: [place]/AT, EOM- WNL, Conjuctivae- clear, PERRLA, TM-WNL, Nose- mucus bridging, Throat- clear and wnl, Mallampati  III NECK: Supple w/ fair ROM, JVD- none, normal carotid impulses w/o bruits Thyroid-  CHEST: Clear to P&A, diminished, no wheeze, unlabored HEART: RRR, no m/g/r heard ABDOMEN: Soft and nl;  ZOX:WRUE, nl pulses, no edema  NEURO: Grossly intact to observation      Impression & Recommendations:  Problem # 1:  COPD (ICD-496) Good control. We are refilling rescue inhaler and discussed techinique, rescue vs maintenance contollers, exacerbating factors.  Problem # 2:  URTICARIA (ICD-708.9) Old problem, symptomatically controlled with chronic maintenance antihistamine  Problem # 3:  TOBACCO ABUSE (ICD-305.1)  I again worked with him on motivation and support measures.  Medications Added to Medication List This Visit: 1)  Diovan 160 Mg Tabs (Valsartan) .... Take 1 by mouth once daily  Other Orders: Est. Patient Level IV (45409)  Patient Instructions: 1)  Please schedule a follow-up appointment in 1 year. 2)  Refill script for rescue inhaler sent to [facility] 3)  Flyer for smoking cessation support program at [employer] Prescriptions: PROVENTIL HFA 108 (90 BASE) MCG/ACT AERS (ALBUTEROL SULFATE) as needed  #1 x prn   Entered and Authorized by:   Waymon Budge MD   Signed by:   Waymon Budge MD on 01/01/2010   Method used:   Electronically to        [facility] [address]* (retail)       [address]       [place], [place]  [address]       Ph: [phone] or [phone]       Fax: [phone]   RxID:   [id number]

## 2010-06-28 NOTE — Letter (Signed)
Summary: Claud Kelp MD  Claud Kelp MD   Imported By: Lester Polkton 06/21/2010 07:23:49  _____________________________________________________________________  External Attachment:    Type:   Image     Comment:   External Document

## 2010-09-01 ENCOUNTER — Other Ambulatory Visit: Payer: Self-pay | Admitting: Internal Medicine

## 2010-09-28 NOTE — Op Note (Signed)
Glen [name], Glen [name]             ACCOUNT NO.:  [id number]   MEDICAL RECORD NO.:  [id number]          PATIENT TYPE:  AMB   LOCATION:  ENDO                         FACILITY:  [facility]   PHYSICIAN:  James L. Malon Kindle., M.D.DATE OF BIRTH:  08/31/1940   DATE OF PROCEDURE:  04/27/2004  DATE OF DISCHARGE:  04/27/2004                                 OPERATIVE REPORT   PROCEDURE:  Colonoscopy and polypectomy.   MEDICATIONS:  1.  Fentanyl 85 mcg.  2.  Versed 6 mg IV.   SCOPE:  Olympus pediatric adjustable colonoscope.   DESCRIPTION OF PROCEDURE:  The procedure had been explained to the patient,  and consent was obtained.  The patient was placed in the left lateral  decubitus position.  The Olympus scope was inserted and advanced. The prep  was excellent.  We were able to reach the cecum without difficulty where the  cecal valve and appendiceal orifice were seen.  The scope was withdrawn, and  the cecum, ascending colon, transverse colon, descending colon were seen  well, and the mid descending colon approximately 70-80 cm of anal verge, a  0.5 cm polyp was removed with a snare.  Unfortunately, the cautery was not  hooked up, and it was transected without cautery.  I did recover it and  sucked it through the scope.  I did touch the base of the polyp with a  cautery, and it cauterized it.  There did not seem to be any bleeding.  The  scope was withdrawn.  Diverticulosis of the sigmoid colon but no other gross  abnormalities are seen.  The scope was withdrawn.  The remainder of the  sigmoid colon and hepatic flexure were seen well and were normal.  The  patient tolerated the procedure well.   ASSESSMENT:  1.  Descending colon polyp removed, 211.3.  2.  Diverticulosis.   PLAN:  The patient received post polypectomy instructions.  Recommended  repeating the procedure in three years.       JLE/MEDQ  D:  04/27/2004  T:  04/29/2004  Job:  [id number]   cc:   Oley Balm. Georgina Pillion, M.D.  [address]  [place]  [place] [address]  Fax: [phone]

## 2010-09-28 NOTE — Procedures (Signed)
[place]. [facility]  Patient:    LORAN, FLEET Visit Number: [id number] MRN: [id number]          Service Type: Attending:  Llana Aliment. Randa Evens, M.D. Dictated by:   Llana Aliment. Randa Evens, M.D. Proc. Date: 02/06/01   CC:         Oley Balm. Georgina Pillion, M.D.                           Procedure Report  PROCEDURE PERFORMED:  Colonoscopy and polypectomy.  ENDOSCOPIST:  Llana Aliment. Randa Evens, M.D.  MEDICATIONS USED:  The patient received a total of 90 mcg of fentanyl and 9 mg of Versed for both procedures.  INSTRUMENT:  INDICATIONS:  Heme positive stool in a gentleman with a previous history of colon polyps.  DESCRIPTION OF PROCEDURE:  The procedure had been explained to the patient and consent obtained.  The pediatric video colonoscope was used.  The patient was turned around following endoscopy.  Digital exam was performed.  The scope was inserted following digital exam and advanced under direct visualization.  The cecum was easily reached .  The ileocecal valve and appendiceal orifice were seen.  The scope was withdrawn.  The cecum, ascending colon, hepatic flexure, transverse colon, splenic flexure, descending and sigmoid colon were seen well upon withdrawal.  In the sigmoid colon was marked diverticular disease.  At 30 cm was a 0.75 cm pedunculated polyp that was removed and recovered.  No active bleeding.  The scope was withdrawn.  The rectum was free of polyps.  The patient tolerated the procedure well.  Maintained on low flow oxygen and pulse oximeter throughout the procedure with no obvious problem.  ASSESSMENT: 1. Sigmoid colon polyp removed. 2. Diverticulosis.  PLAN: 1. Will recommend repeating in three years.  Routine post polypectomy    instructions. 2. Will see back in our office in two to three months. Dictated by:   Llana Aliment. Randa Evens, M.D. Attending:  Llana Aliment. Randa Evens, M.D. DD:  02/06/01 TD:  02/06/01 Job: [id number]

## 2010-09-28 NOTE — Procedures (Signed)
[place]. [facility]  Patient:    Glen [name]                     MRN: [id number] Proc. Date: 04/12/99 Adm. Date:  [id number] Attending:  Orland Mustard CC:         Jethro Bastos, M.D.                           Procedure Report  PROCEDURE:  Colonoscopy with polypectomy.  PREMEDICATION:  Fentanyl 80 mcg and Versed 6 mg IV.  INSTRUMENTS:  Olympus video colonoscope.  INDICATION:  Mr. Formica is a gentleman who has had colon polyps removed back in 1997.  This is done as a three-year follow-up.  DESCRIPTION OF PROCEDURE:  The procedure has been explained to the patient and consent obtained.  The patient in the left lateral decubitus position.  The Olympus adult video colonoscope was inserted and advanced under direct visualization.  The prep was  excellent.  He had diverticulosis to the left colon.  We were able to pass this and advance rapidly to the cecum.  The ileocecal valve was identified as was the appendiceal orifice.  The scope was withdrawn.  The cecum, ascending colon, hepatic flexure, transverse colon, splenic flexure, descending, and sigmoid colon were seen well. Diverticular disease in the sigmoid colon and descending colon.  No other lesions seen.  The scope was withdrawn in the rectum.  On the retroflex view, there was a 1.5 m polyp found and was removed with a snare and sucked through the scope.  No other polyps were seen.  The patient did have internal hemorrhoids.  The scope was withdrawn.  The patient tolerated the procedure well.  He was maintained on low flow oxygen and pulse oximeter throughout the procedure with no obvious problems.  ASSESSMENT:  Rectal polyp removed.  PLAN:  We will recommend repeating the procedure in three years.  Routine postpolypectomy instructions. DD:  04/12/99 TD:  04/12/99 Job: [id number]

## 2010-09-28 NOTE — Procedures (Signed)
[place]. [facility]  Patient:    Glen [name], Glen [name] Visit Number: [id number] MRN: [id number]          Service Type: Attending:  Llana Aliment. Randa Evens, M.D. Dictated by:   Llana Aliment. Randa Evens, M.D. Proc. Date: 02/06/01   CC:         Oley Balm. Georgina Pillion, M.D.                           Procedure Report  PROCEDURE PERFORMED:  Esophagogastroduodenscopy.  ENDOSCOPIST:  Llana Aliment. Randa Evens, M.D.  MEDICATIONS:  Cetacaine spray, fentanyl 50 mcg, Versed 5 mg IV.  INDICATIONS:  Heme positive stool in a gentleman with a history of colon polyps.  DESCRIPTION OF PROCEDURE:  The procedure had been explained to the patient and consent obtained.  With the patient in the left lateral decubitus position, the Olympus video endoscope was inserted blindly in the esophagus and advanced under direct visualization.  The distal esophagus was reached and was found to have a widely patent gastroesophageal junction with ulcerative esophagitis with multiple linear ulcers extending up into the lower esophagus.  There is no active bleeding.  Probably a slight hiatal hernia.  The scope was entered. The pylorus identified and passed.  The duodenal bulb was normal other than some inflammation consistent with duodenitis.  The pyloric channel was normal. The antrum and body of the stomach were seen well and were normal.  Fundus and cardia were seen well on the retroflex view and were normal.  The scope was withdrawn and the ulcerative esophagitis with linear ulcerations was confirmed.  The patient tolerated the procedure well, was maintained on low-flow oxygen, pulse oximeter throughout the procedure.  ASSESSMENT:  Ulcerative esophagitis almost certainly contributing to his heme positive stool.  PLAN:  Will proceed with colonoscopy at this time as planned.  Will go ahead and start him empirically on Aciphex. Dictated by:   Llana Aliment. Randa Evens, M.D. Attending:  Llana Aliment. Randa Evens, M.D. DD:  02/06/01 TD:   02/06/01 Job: [id number]

## 2010-11-16 ENCOUNTER — Encounter: Payer: Self-pay | Admitting: Internal Medicine

## 2010-11-16 ENCOUNTER — Ambulatory Visit (INDEPENDENT_AMBULATORY_CARE_PROVIDER_SITE_OTHER): Payer: Medicare Other | Admitting: Internal Medicine

## 2010-11-16 VITALS — BP 142/80 | HR 83 | Ht 66.0 in | Wt 212.2 lb

## 2010-11-16 DIAGNOSIS — J309 Allergic rhinitis, unspecified: Secondary | ICD-10-CM

## 2010-11-16 DIAGNOSIS — F17200 Nicotine dependence, unspecified, uncomplicated: Secondary | ICD-10-CM

## 2010-11-16 DIAGNOSIS — J449 Chronic obstructive pulmonary disease, unspecified: Secondary | ICD-10-CM

## 2010-11-16 MED ORDER — MOMETASONE FUROATE 50 MCG/ACT NA SUSP: 2.0000 | Freq: Every day | NASAL | Status: DC

## 2010-11-16 MED ORDER — METHYLPREDNISOLONE ACETATE 80 MG/ML IJ SUSP
80.0000 mg | Freq: Once | INTRAMUSCULAR | Status: AC
  Administered 2010-11-16: 80 mg via INTRAMUSCULAR

## 2010-11-16 MED ORDER — ALBUTEROL SULFATE (2.5 MG/3ML) 0.083% IN NEBU
2.5000 mg | INHALATION_SOLUTION | Freq: Once | RESPIRATORY_TRACT | Status: AC
  Administered 2010-11-16: 2.5 mg via RESPIRATORY_TRACT

## 2010-11-16 NOTE — Progress Notes (Signed)
  Subjective:    Patient ID: Glen [name], male    DOB: 10/01/1940, 70 y.o.   MRN: [id number]  HPI 11/15/10- 70 yo M smoker followed for asthma/ COPD, rhinitis, hx urticaria, complicated by HBP,  Last here January 01, 2010 - note reviewed Since here had colonoscopy, rectal polyp and cataract surgeries- with no respiratory problems.  Acute visit. He put out a lot of dusty pine needles 1 week ago. Was coughing a little at routine PhYsEx but has gotten steadily tighter. Cough productive light yellow. Denies chest pain, blood, fever, purulent.  Admits bothersome nasal pressure and postnasal drip.  Admits only rare cigarette now.   Review of Systems Constitutional:   No weight loss, night sweats,  Fevers, chills, fatigue, lassitude. HEENT:   No headaches,  Difficulty swallowing,  Tooth/dental problems,  Sore throat,                No sneezing, itching, ear ache,   CV:  No chest pain,  Orthopnea, PND, swelling in lower extremities, anasarca, dizziness, palpitations  GI  No heartburn, indigestion, abdominal pain, nausea, vomiting, diarrhea, change in bowel habits, loss of appetite  Resp:   No excess mucus,   No coughing up of blood.  No change in color of mucus.  No wheezing.   Skin: no rash or lesions.  GU: no dysuria, change in color of urine, no urgency or frequency.  No flank pain.  MS:  No joint pain or swelling.  No decreased range of motion.  No back pain.  Psych:  No change in mood or affect. No depression or anxiety.  No memory loss.        Objective:   Physical Exam General- Alert, Oriented, Affect-appropriate, Distress- none acute  Skin- rash-none, lesions- none, excoriation- none  Lymphadenopathy- none  Head- atraumatic  Eyes- Gross vision intact, PERRLA, conjunctivae clear secretions  Ears- Hearing, canals, Tm - normal  Nose- Clear, No- Septal dev, mucus, polyps, erosion, perforation   Throat- Mallampati II , mucosa clear , drainage- none, tonsils-  atrophic  Neck- flexible , trachea midline, no stridor , thyroid nl, carotid no bruit  Chest - symmetrical excursion , unlabored     Heart/CV- RRR , no murmur , no gallop  , no rub, nl s1 s2                     - JVD- none , edema- none, stasis changes- none, varices- none     Lung- clear to P&A, but distant, wheeze- none, cough- none , dullness-none, rub- none   unlabored     Chest wall-   Abd- tender-no, distended-no, bowel sounds-present, HSM- no  Br/ Gen/ Rectal- Not done, not indicated  Extrem- cyanosis- none, clubbing, none, atrophy- none, strength- nl  Neuro- grossly intact to observation         Assessment & Plan:

## 2010-11-16 NOTE — Assessment & Plan Note (Signed)
Diminishing now to level of rare social cigarette. Recognize risk of relapse and encouraged to stop.

## 2010-11-16 NOTE — Patient Instructions (Signed)
Neb albuterol  Depo 80  Sample/ script Nasonex nasal spray    -   2 sprays each nostril once every day at bedtime  Neti pot

## 2010-11-16 NOTE — Assessment & Plan Note (Signed)
There are no polyps. He had septoplasty in 1977. We will try Nasonex.

## 2010-11-16 NOTE — Assessment & Plan Note (Signed)
Acute bronchitis exacerbation caused by pine needle dust and air quality. Doubt infection now.

## 2011-01-16 ENCOUNTER — Other Ambulatory Visit: Payer: Self-pay | Admitting: Internal Medicine

## 2011-05-23 ENCOUNTER — Other Ambulatory Visit: Payer: Self-pay | Admitting: Family Medicine

## 2011-05-23 DIAGNOSIS — Z136 Encounter for screening for cardiovascular disorders: Secondary | ICD-10-CM

## 2011-05-29 ENCOUNTER — Ambulatory Visit
Admission: RE | Admit: 2011-05-29 | Discharge: 2011-05-29 | Disposition: A | Discharge status: Home / Self Care | Payer: Medicare Other | Source: Ambulatory Visit | Attending: Family Medicine | Admitting: Family Medicine

## 2011-05-29 DIAGNOSIS — Z136 Encounter for screening for cardiovascular disorders: Secondary | ICD-10-CM

## 2011-05-29 IMAGING — US US AORTA SCREENING (MEDICARE)
1 series · 9 of 9 positions shown · non-contrast
Comparison: None relevant.

CLINICAL DATA: Abdominal aortic aneurysm screening.  Ex-smoker.

ABDOMINAL AORTA SCREENING ULTRASOUND
TECHNIQUE: Ultrasound examination of the abdominal aorta was
performed as a screening evaluation for abdominal aortic aneurysm.

[Series 1: us aorta screening (medicare) · 0.36mm/px · 9 of 9 slices shown]
[im 1/9]
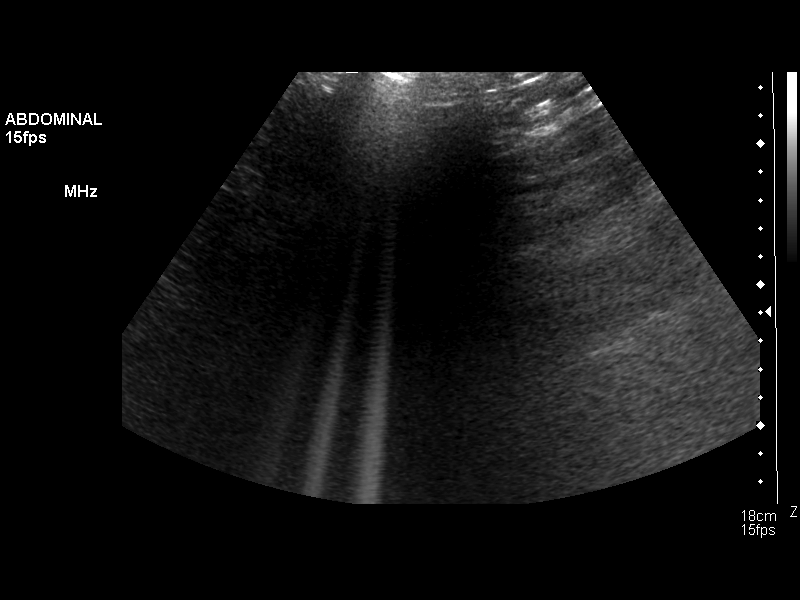
[im 2/9]
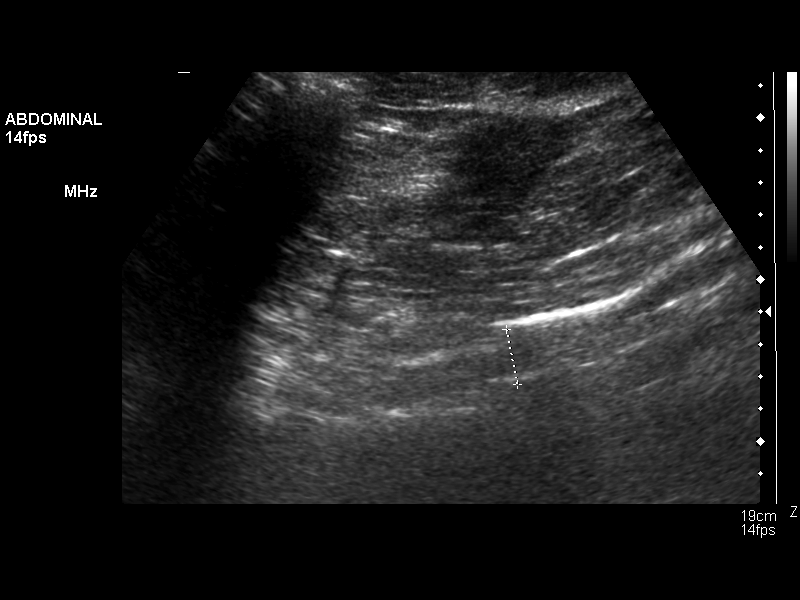
[im 3/9]
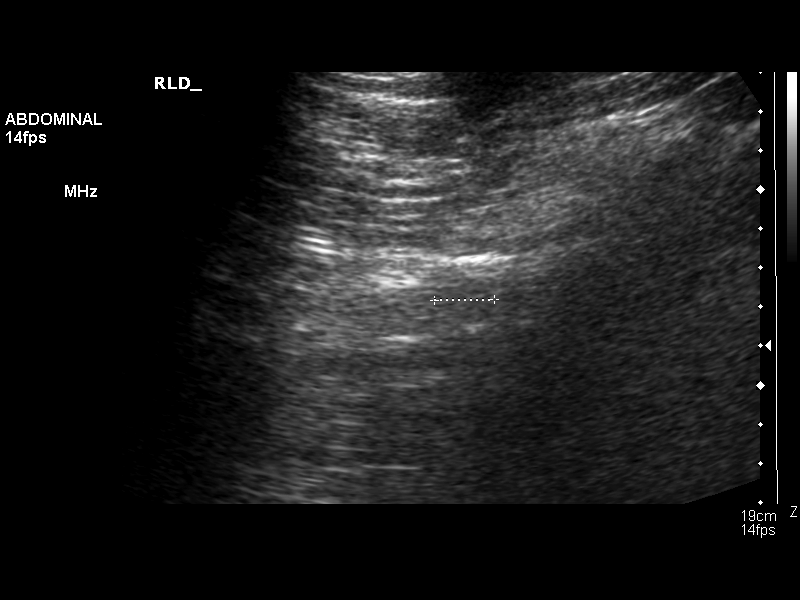
[im 4/9]
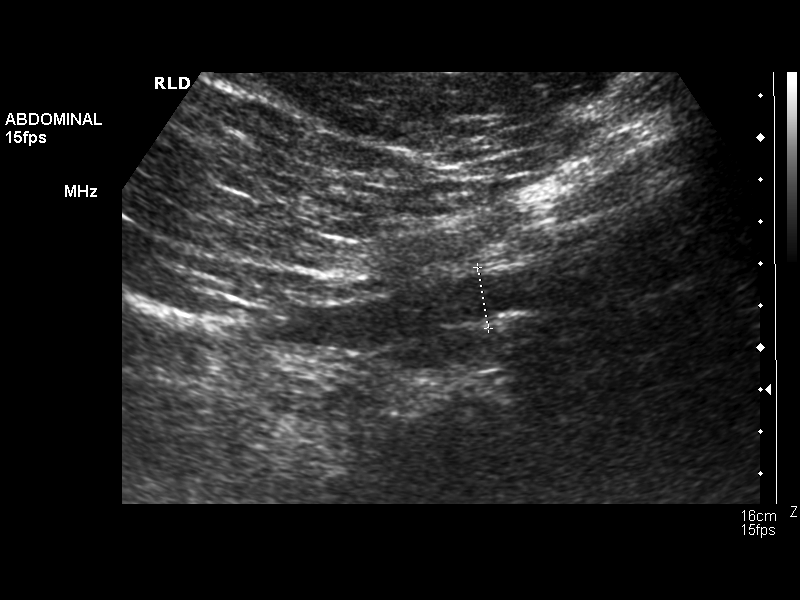
[im 5/9]
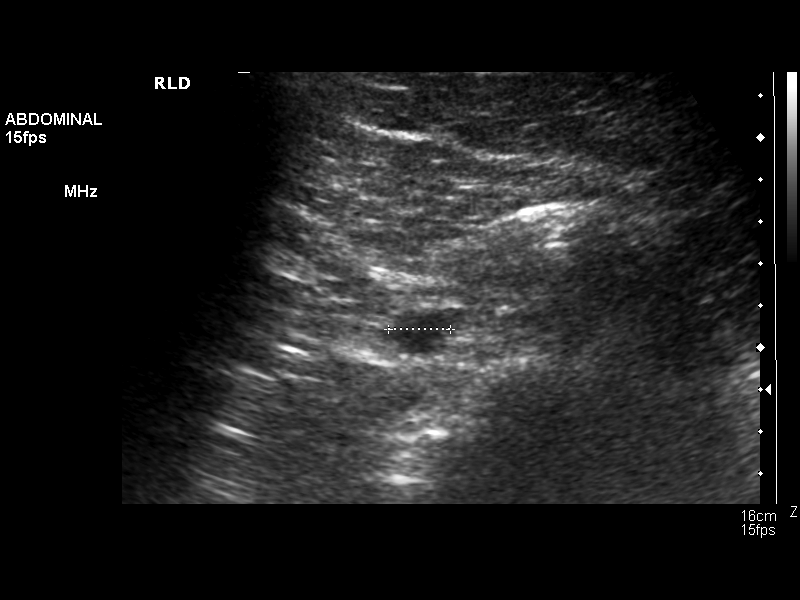
[im 6/9]
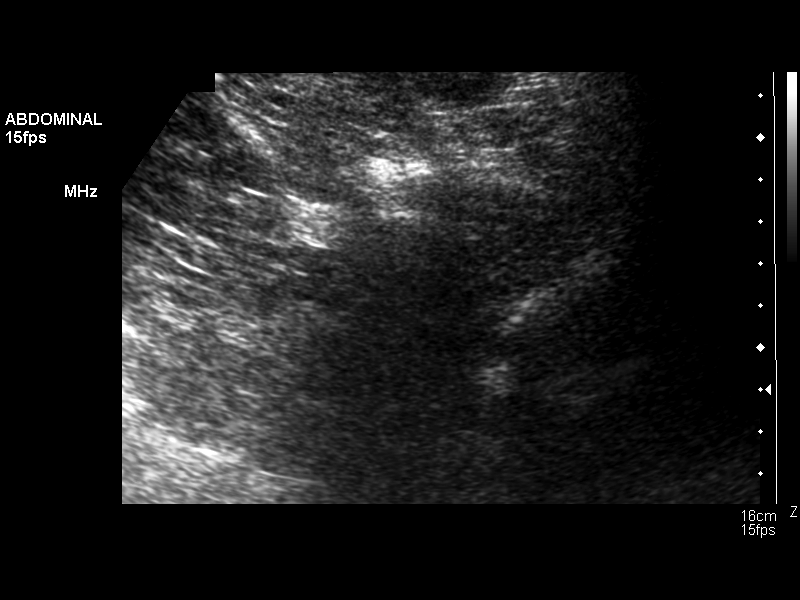
[im 7/9]
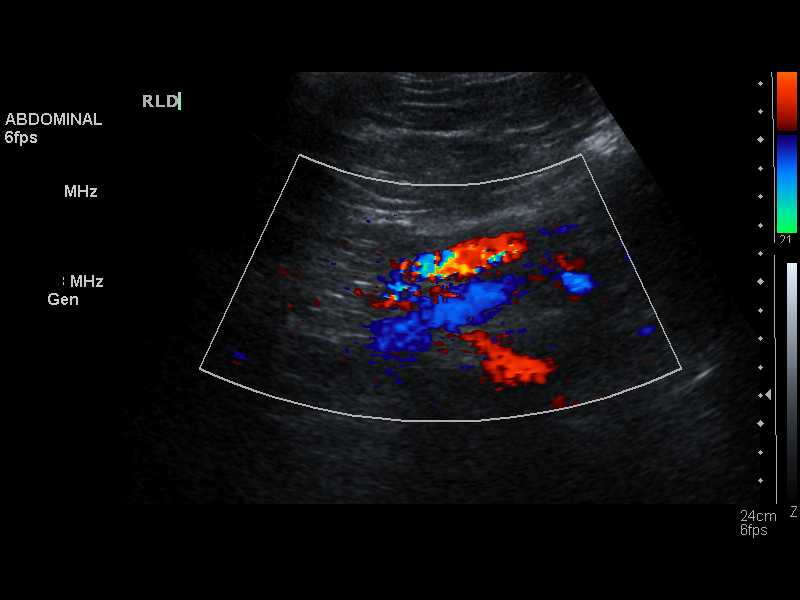
[im 8/9]
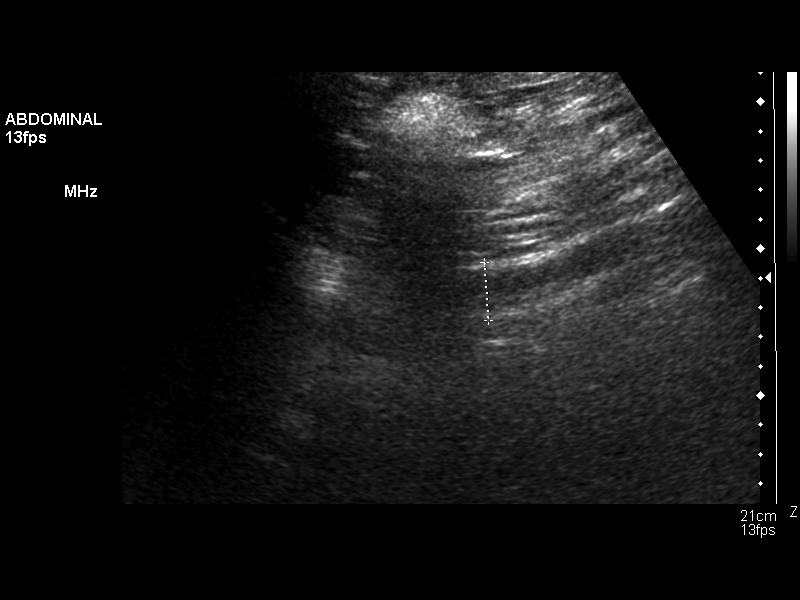
[im 9/9]
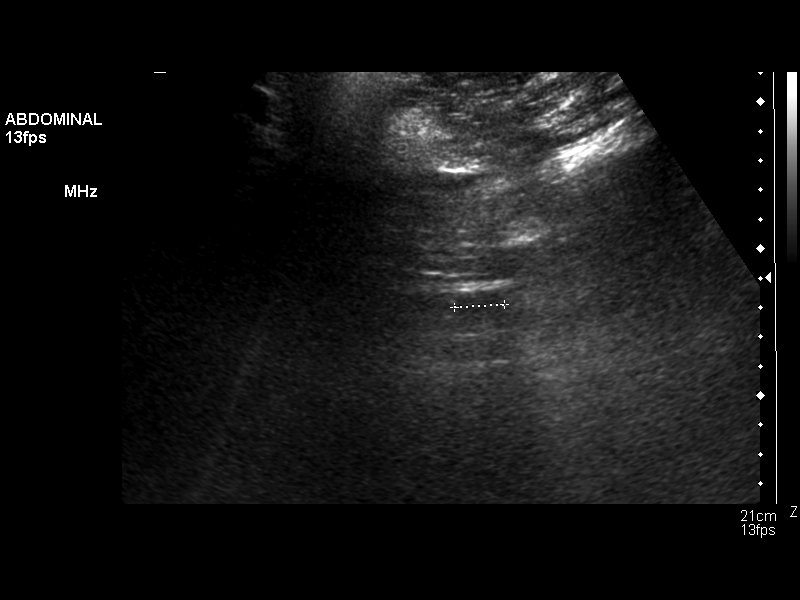

[9 of 9 positions shown; findings below may reference images not displayed]

Abdominal Aorta: No aneurysm identified.

      Maximum AP diameter:  2.0 cm.
      Maximum TRV diameter:  1.7 cm.

The common iliac arteries are not visualized due to bowel gas.
IMPRESSION: No abdominal aortic aneurysm identified.  Study is mildly limited
by bowel gas.

## 2011-07-27 ENCOUNTER — Other Ambulatory Visit: Payer: Self-pay | Admitting: Internal Medicine

## 2011-12-10 ENCOUNTER — Ambulatory Visit (INDEPENDENT_AMBULATORY_CARE_PROVIDER_SITE_OTHER): Payer: Medicare Other | Admitting: Internal Medicine

## 2011-12-10 ENCOUNTER — Ambulatory Visit (INDEPENDENT_AMBULATORY_CARE_PROVIDER_SITE_OTHER)
Admission: RE | Admit: 2011-12-10 | Discharge: 2011-12-10 | Disposition: A | Discharge status: Home / Self Care | Payer: Medicare Other | Source: Ambulatory Visit | Attending: Internal Medicine | Admitting: Internal Medicine

## 2011-12-10 ENCOUNTER — Encounter: Payer: Self-pay | Admitting: Internal Medicine

## 2011-12-10 VITALS — BP 140/70 | HR 82 | Ht 66.0 in | Wt 200.6 lb

## 2011-12-10 DIAGNOSIS — J4489 Other specified chronic obstructive pulmonary disease: Secondary | ICD-10-CM

## 2011-12-10 DIAGNOSIS — F17200 Nicotine dependence, unspecified, uncomplicated: Secondary | ICD-10-CM

## 2011-12-10 DIAGNOSIS — J449 Chronic obstructive pulmonary disease, unspecified: Secondary | ICD-10-CM

## 2011-12-10 IMAGING — CR DG CHEST 2V
2 series · 2 of 2 positions shown · non-contrast
Comparison: [DATE].

CLINICAL DATA: Physical.

CHEST - 2 VIEW

[view not recorded (1 of 2)]
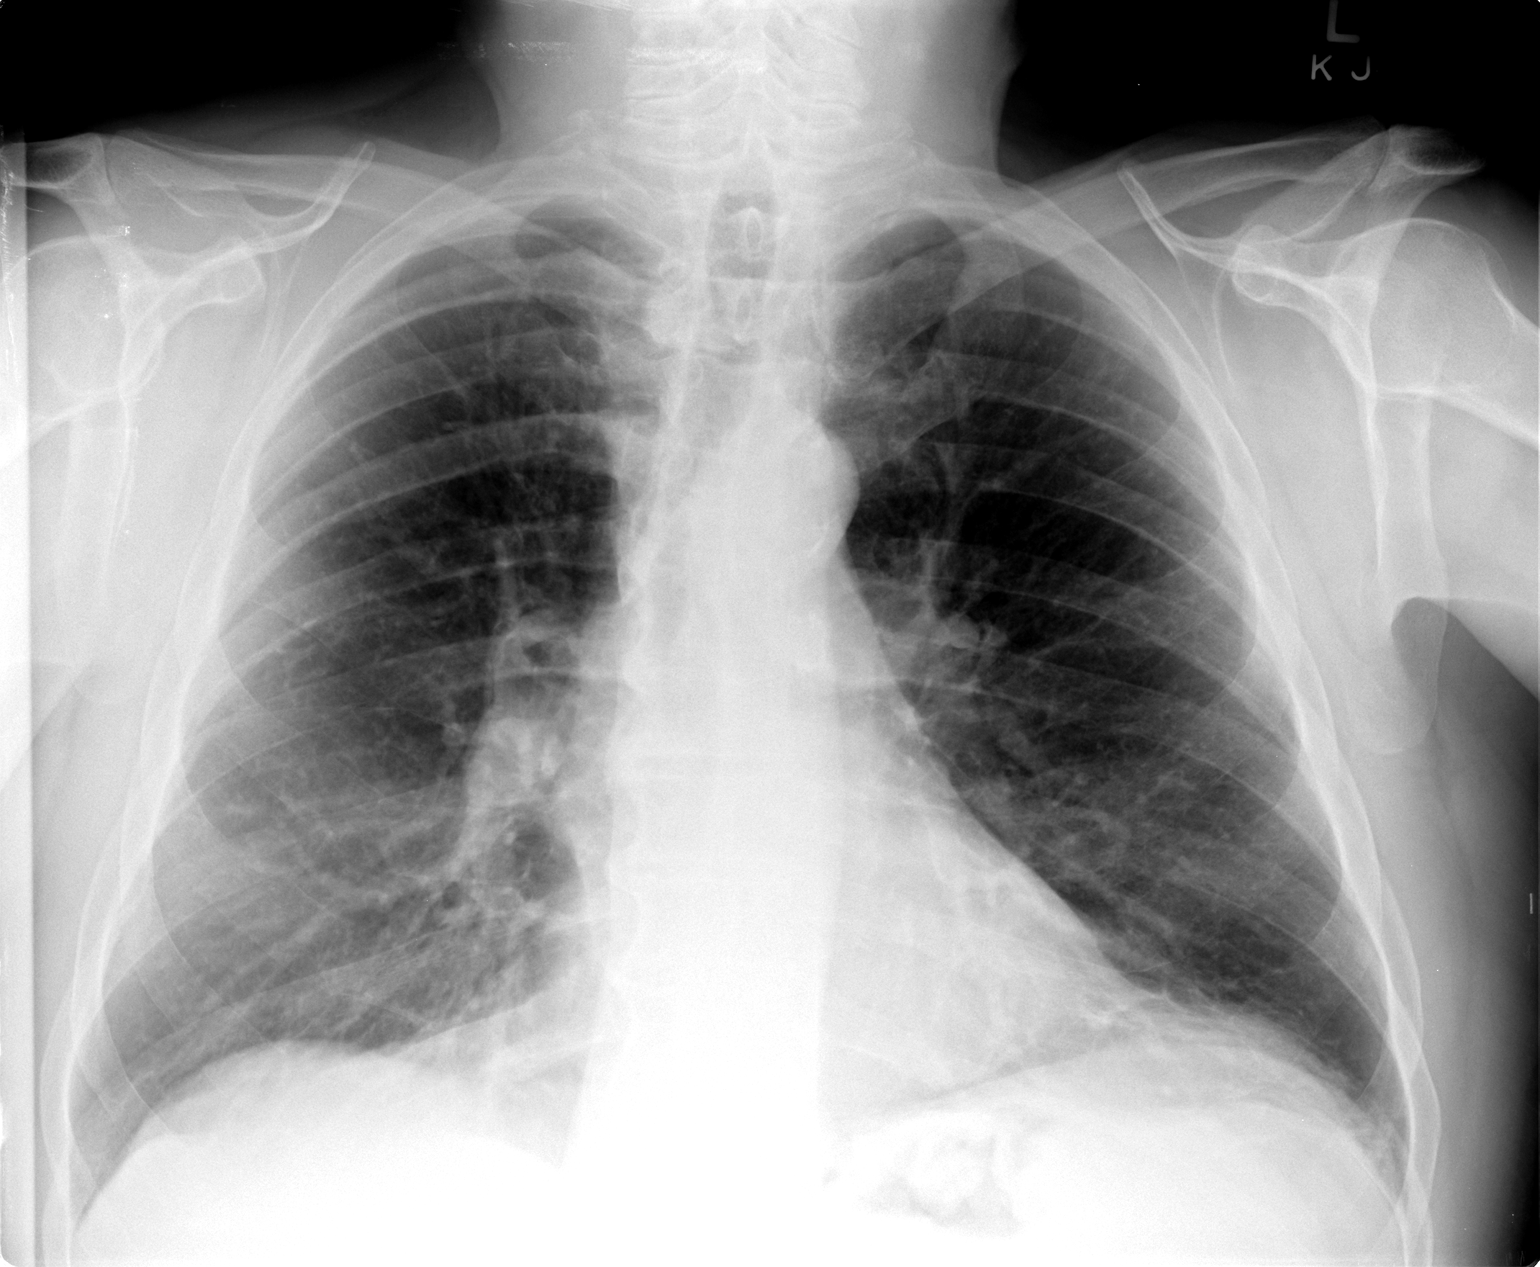

[view not recorded (2 of 2)]
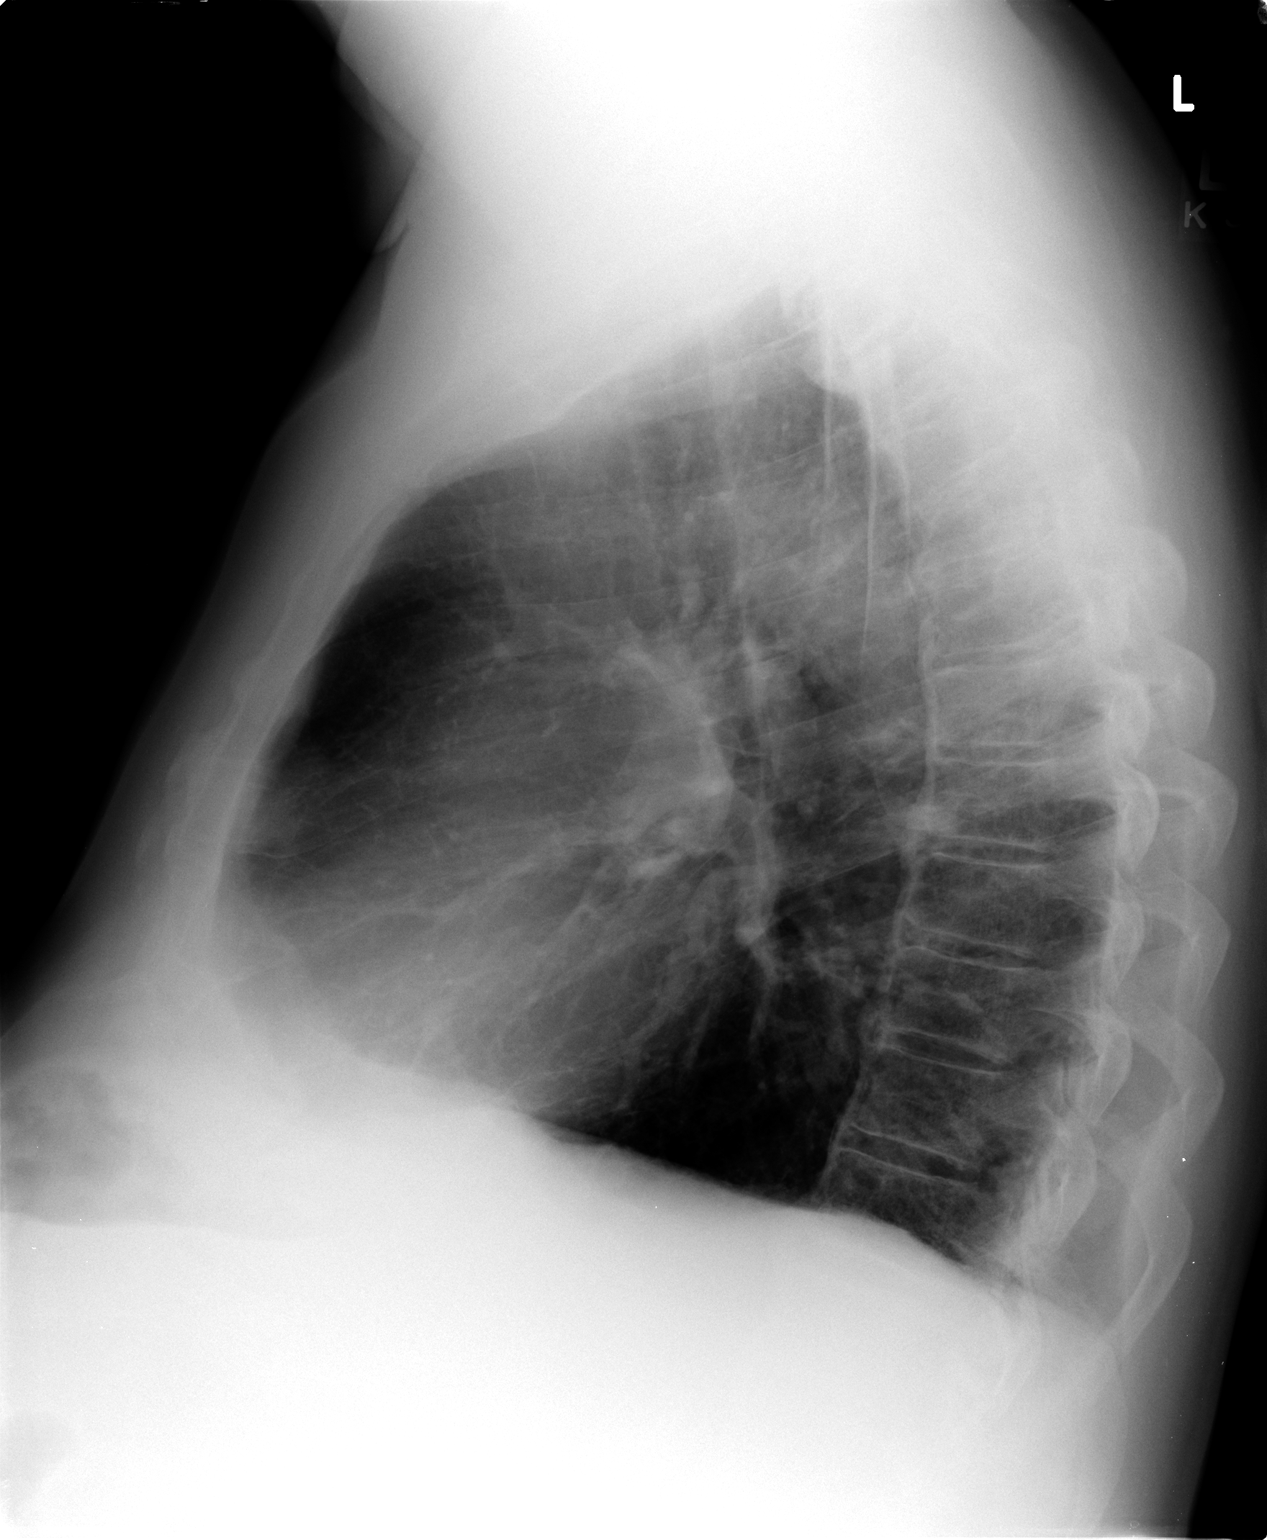

[2 of 2 positions shown; findings below may reference images not displayed]

FINDINGS: Trachea is midline.  Heart size stable.  Hilar regions
appear mildly prominent, unchanged.  Bibasilar scarring and/or
atelectasis.  No pleural fluid.  Flowing anterior osteophytosis in
the thoracic spine.
IMPRESSION: Minimal bibasilar atelectasis and/or scarring.

## 2011-12-10 MED ORDER — FLUTICASONE-SALMETEROL 250-50 MCG/DOSE IN AEPB: INHALATION_SPRAY | RESPIRATORY_TRACT | Status: DC

## 2011-12-10 MED ORDER — ALBUTEROL SULFATE HFA 108 (90 BASE) MCG/ACT IN AERS: 2.0000 | INHALATION_SPRAY | RESPIRATORY_TRACT | Status: DC | PRN

## 2011-12-10 MED ORDER — TIOTROPIUM BROMIDE MONOHYDRATE 18 MCG IN CAPS: 18.0000 ug | ORAL_CAPSULE | Freq: Every day | RESPIRATORY_TRACT | Status: DC

## 2011-12-10 NOTE — Progress Notes (Signed)
Subjective:    Patient ID: Glen [name], male    DOB: 10-Oct-1940, 71 y.o.   MRN: [id number]  HPI 11/15/10- 52 yo M smoker followed for asthma/ COPD, rhinitis, hx urticaria, complicated by HBP,  Last here January 01, 2010 - note reviewed Since here had colonoscopy, rectal polyp and cataract surgeries- with no respiratory problems.  Acute visit. He put out a lot of dusty pine needles 1 week ago. Was coughing a little at routine PhYsEx but has gotten steadily tighter. Cough productive light yellow. Denies chest pain, blood, fever, purulent.  Admits bothersome nasal pressure and postnasal drip.  Admits only rare cigarette now.   12/10/11- 70 yo M smoker followed for asthma/ COPD, rhinitis, hx urticaria, complicated by HBP, 1 year follow-up. Patient states better since last visit. c/o sob occassionally, little wheezing and coughing.  Denies chest pain and chest tightness.  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 Had pneumonia vaccine at his primary physician office. COPD assessment test (CAT) score 17/40  ROS-see HPI Constitutional:   No-   weight loss, night sweats, fevers, chills, fatigue, lassitude. HEENT:   No-  headaches, difficulty swallowing, tooth/dental problems, sore throat,       No-  sneezing, itching, ear ache, nasal congestion, post nasal drip,  CV:  No-   chest pain, orthopnea, PND, swelling in lower extremities, anasarca, dizziness, palpitations Resp: No-   shortness of breath with exertion or at rest.              No-   productive cough,  No non-productive cough,  No- coughing up of blood.              No-   change in color of mucus.  No- wheezing.   Skin: No-   rash or lesions. GI:  No-   heartburn, indigestion, abdominal pain, nausea, vomiting,  GU: . MS:  No-   joint pain or swelling.  . Neuro-     nothing  unusual Psych:  No- change in mood or affect. No depression or anxiety.  No memory loss.  OBJ- Physical Exam General- Alert, Oriented, Affect-appropriate, Distress- none acute, overweight Skin- rash-none, lesions- none, excoriation- none Lymphadenopathy- none Head- atraumatic            Eyes- Gross vision intact, PERRLA, conjunctivae and secretions clear            Ears- Hearing, canals-normal            Nose- Clear, no-Septal dev, mucus, polyps, erosion, perforation             Throat- Mallampati II , mucosa clear , drainage- none, tonsils- atrophic Neck- flexible , trachea midline, no stridor , thyroid nl, carotid no bruit Chest - symmetrical excursion , unlabored           Heart/CV- RRR , no murmur , no gallop  , no rub, nl s1 s2                           - JVD- none , edema- none, stasis changes- none, varices- none           Lung- + light rattle, wheeze- none, cough- none , dullness-none, rub- none           Chest wall-  Abd-  Br/ Gen/ Rectal- Not done, not indicated Extrem- cyanosis- none, clubbing, none, atrophy- none, strength- nl Neuro- grossly intact to observation Assessment & Plan:

## 2011-12-10 NOTE — Patient Instructions (Addendum)
Order- CXR   Dx COPD  Script refills for Spiriva, Advair, albuterol HFA   You can ask your pharmacist to use the albuterol inhaler your insurance prefers.

## 2011-12-11 NOTE — Progress Notes (Signed)
Quick Note:  Pt aware of results. ______ 

## 2011-12-16 NOTE — Assessment & Plan Note (Signed)
We discussed his occasional social cigarette, emphasizing that it is too easy to backslide unless he gets up completely

## 2011-12-16 NOTE — Assessment & Plan Note (Signed)
Good maintenance control with rare need for rescue inhaler. We discussed medications and refills. Plan-chest x-ray

## 2012-12-16 ENCOUNTER — Encounter: Payer: Self-pay | Admitting: Internal Medicine

## 2012-12-16 ENCOUNTER — Ambulatory Visit (INDEPENDENT_AMBULATORY_CARE_PROVIDER_SITE_OTHER): Payer: Medicare Other | Admitting: Internal Medicine

## 2012-12-16 VITALS — BP 118/60 | HR 88 | Ht 66.0 in | Wt 201.8 lb

## 2012-12-16 DIAGNOSIS — J4489 Other specified chronic obstructive pulmonary disease: Secondary | ICD-10-CM

## 2012-12-16 DIAGNOSIS — J449 Chronic obstructive pulmonary disease, unspecified: Secondary | ICD-10-CM

## 2012-12-16 NOTE — Patient Instructions (Addendum)
We can continue present meds  Order- schedule PFT and 6 MWT       Dx COPD  Walk as much as you can for stamina and weight loss. This will help your breathing as much as any medicine.  Please call as needed

## 2012-12-16 NOTE — Progress Notes (Signed)
Subjective:    Patient ID: Glen [name], male    DOB: February 17, 1941, 72 y.o.   MRN: [id number]  HPI 11/15/10- 89 yo M smoker followed for asthma/ COPD, rhinitis, hx urticaria, complicated by HBP,  Last here January 01, 2010 - note reviewed Since here had colonoscopy, rectal polyp and cataract surgeries- with no respiratory problems.  Acute visit. He put out a lot of dusty pine needles 1 week ago. Was coughing a little at routine PhYsEx but has gotten steadily tighter. Cough productive light yellow. Denies chest pain, blood, fever, purulent.  Admits bothersome nasal pressure and postnasal drip.  Admits only rare cigarette now.   12/10/11- 64 yo M smoker followed for asthma/ COPD, rhinitis, hx urticaria, complicated by HBP, 1 year follow-up. Patient states better since last visit. c/o sob occassionally, little wheezing and coughing.  Denies chest pain and chest tightness.  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 Had pneumonia vaccine at his primary physician office. COPD assessment test (CAT) score 17/40  12/16/12- 9 yo M  Former smoker followed for asthma/ COPD, rhinitis, hx urticaria, complicated by HBP FOLLOWS FOR: SOB at times still-worse with activity.  "not bad". No change since last visit. Short of breath especially with lifting or using his arms, mowing the lawn etc. More cough in the last few weeks with "cloudy" sputum but no fever, blood, chest pain. CXR 12/10/12 IMPRESSION:  Minimal bibasilar atelectasis and/or scarring.  Original Report Authenticated By: Reyes Ivan, M.D.  ROS-see HPI Constitutional:   No-   weight loss, night sweats, fevers, chills, +fatigue, lassitude. HEENT:   No-  headaches, difficulty swallowing, tooth/dental problems, sore throat,       No-  sneezing, itching, ear ache, nasal congestion, post  nasal drip,  CV:  No-   chest pain, orthopnea, PND, swelling in lower extremities, anasarca, dizziness, palpitations Resp: +  shortness of breath with exertion or at rest.           + productive cough,  No non-productive cough,  No- coughing up of blood.              No-   change in color of mucus.  No- wheezing.   Skin: No-   rash or lesions. GI:  No-   heartburn, indigestion, abdominal pain, nausea, vomiting,  GU: . MS:  No-   joint pain or swelling.  . Neuro-     nothing unusual Psych:  No- change in mood or affect. + depression or anxiety.  No memory loss.  OBJ- Physical Exam General- Alert, Oriented, Affect-appropriate, Distress- none acute, overweight Skin- rash-none, lesions- none, excoriation- none Lymphadenopathy- none Head- atraumatic            Eyes- Gross vision intact, PERRLA, conjunctivae and secretions clear            Ears- Hearing, canals-normal            Nose- Clear, no-Septal dev, mucus, polyps, erosion, perforation             Throat- Mallampati II , mucosa clear , drainage- none, tonsils- atrophic Neck- flexible , trachea midline, no stridor , thyroid nl, carotid no bruit Chest - symmetrical excursion , unlabored           Heart/CV- RRR , no murmur , no gallop  , no rub, nl  s1 s2                           - JVD- none , edema- none, stasis changes- none, varices- none           Lung- + crackles R>L chest, wheeze- none, cough- none , dullness-none, rub- none           Chest wall-  Abd-  Br/ Gen/ Rectal- Not done, not indicated Extrem- cyanosis- none, clubbing, none, atrophy- none, strength- nl Neuro- grossly intact to observation Assessment & Plan:

## 2012-12-28 NOTE — Assessment & Plan Note (Addendum)
Cough indicates bronchitis. Probably some recent exacerbation. Plan-schedule PFT with 6 minute walk test for update comparison with 2011

## 2012-12-29 ENCOUNTER — Other Ambulatory Visit: Payer: Self-pay | Admitting: Internal Medicine

## 2013-01-06 ENCOUNTER — Other Ambulatory Visit: Payer: Self-pay | Admitting: Internal Medicine

## 2013-02-01 ENCOUNTER — Other Ambulatory Visit: Payer: Self-pay | Admitting: Internal Medicine

## 2013-02-04 ENCOUNTER — Ambulatory Visit: Payer: Medicare Other

## 2013-02-26 ENCOUNTER — Ambulatory Visit (INDEPENDENT_AMBULATORY_CARE_PROVIDER_SITE_OTHER): Payer: Medicare Other | Admitting: Internal Medicine

## 2013-02-26 ENCOUNTER — Ambulatory Visit (INDEPENDENT_AMBULATORY_CARE_PROVIDER_SITE_OTHER): Payer: Medicare Other

## 2013-02-26 DIAGNOSIS — J449 Chronic obstructive pulmonary disease, unspecified: Secondary | ICD-10-CM

## 2013-02-26 DIAGNOSIS — Z23 Encounter for immunization: Secondary | ICD-10-CM

## 2013-02-26 DIAGNOSIS — R0602 Shortness of breath: Secondary | ICD-10-CM

## 2013-02-26 LAB — PULMONARY FUNCTION TEST

## 2013-02-26 NOTE — Progress Notes (Signed)
PFT done today. 

## 2013-03-03 NOTE — Progress Notes (Signed)
Documentation for 6 minute walk test 

## 2013-03-12 ENCOUNTER — Other Ambulatory Visit: Payer: Self-pay | Admitting: Internal Medicine

## 2013-04-01 ENCOUNTER — Other Ambulatory Visit: Payer: Self-pay | Admitting: Internal Medicine

## 2013-04-29 ENCOUNTER — Other Ambulatory Visit: Payer: Self-pay | Admitting: Internal Medicine

## 2013-05-24 ENCOUNTER — Other Ambulatory Visit: Payer: Self-pay | Admitting: Gastroenterology

## 2013-07-01 ENCOUNTER — Other Ambulatory Visit: Payer: Self-pay | Admitting: Internal Medicine

## 2013-07-16 ENCOUNTER — Other Ambulatory Visit: Payer: Self-pay | Admitting: Internal Medicine

## 2013-07-23 ENCOUNTER — Emergency Department (HOSPITAL_COMMUNITY): Payer: Medicare Other

## 2013-07-23 ENCOUNTER — Encounter (HOSPITAL_COMMUNITY): Payer: Self-pay | Admitting: Emergency Medicine

## 2013-07-23 ENCOUNTER — Emergency Department (HOSPITAL_COMMUNITY)
Admission: EM | Admit: 2013-07-23 | Discharge: 2013-07-23 | Disposition: A | Discharge status: Home / Self Care | Payer: Medicare Other | Attending: Emergency Medicine | Admitting: Emergency Medicine

## 2013-07-23 DIAGNOSIS — F411 Generalized anxiety disorder: Secondary | ICD-10-CM | POA: Insufficient documentation

## 2013-07-23 DIAGNOSIS — IMO0002 Reserved for concepts with insufficient information to code with codable children: Secondary | ICD-10-CM | POA: Insufficient documentation

## 2013-07-23 DIAGNOSIS — R072 Precordial pain: Secondary | ICD-10-CM | POA: Insufficient documentation

## 2013-07-23 DIAGNOSIS — Z79899 Other long term (current) drug therapy: Secondary | ICD-10-CM | POA: Insufficient documentation

## 2013-07-23 DIAGNOSIS — J45909 Unspecified asthma, uncomplicated: Secondary | ICD-10-CM | POA: Insufficient documentation

## 2013-07-23 DIAGNOSIS — R0602 Shortness of breath: Secondary | ICD-10-CM | POA: Insufficient documentation

## 2013-07-23 DIAGNOSIS — Z87891 Personal history of nicotine dependence: Secondary | ICD-10-CM | POA: Insufficient documentation

## 2013-07-23 DIAGNOSIS — F329 Major depressive disorder, single episode, unspecified: Secondary | ICD-10-CM | POA: Insufficient documentation

## 2013-07-23 DIAGNOSIS — I10 Essential (primary) hypertension: Secondary | ICD-10-CM | POA: Insufficient documentation

## 2013-07-23 DIAGNOSIS — R079 Chest pain, unspecified: Secondary | ICD-10-CM

## 2013-07-23 DIAGNOSIS — Z8709 Personal history of other diseases of the respiratory system: Secondary | ICD-10-CM | POA: Insufficient documentation

## 2013-07-23 DIAGNOSIS — F3289 Other specified depressive episodes: Secondary | ICD-10-CM | POA: Insufficient documentation

## 2013-07-23 DIAGNOSIS — K279 Peptic ulcer, site unspecified, unspecified as acute or chronic, without hemorrhage or perforation: Secondary | ICD-10-CM | POA: Insufficient documentation

## 2013-07-23 HISTORY — DX: Anxiety disorder, unspecified: F41.9

## 2013-07-23 HISTORY — DX: Depression, unspecified: F32.A

## 2013-07-23 HISTORY — DX: Major depressive disorder, single episode, unspecified: F32.9

## 2013-07-23 LAB — COMPREHENSIVE METABOLIC PANEL
ALT: 19 U/L (ref 0–53)
AST: 19 U/L (ref 0–37)
Albumin: 3.8 g/dL (ref 3.5–5.2)
Alkaline Phosphatase: 64 U/L (ref 39–117)
BUN: 21 mg/dL (ref 6–23)
CALCIUM: 9 mg/dL (ref 8.4–10.5)
CO2: 23 mEq/L (ref 19–32)
Chloride: 100 mEq/L (ref 96–112)
Creatinine, Ser: 0.86 mg/dL (ref 0.50–1.35)
GFR calc Af Amer: >90 mL/min (ref >90)
GFR calc non Af Amer: 84 mL/min — ABNORMAL LOW (ref >90)
Glucose, Bld: 169 mg/dL — ABNORMAL HIGH (ref 70–99)
Potassium: 4.1 mEq/L (ref 3.7–5.3)
Sodium: 139 mEq/L (ref 137–147)
Total Bilirubin: 0.2 mg/dL — ABNORMAL LOW (ref 0.3–1.2)
Total Protein: 6.9 g/dL (ref 6.0–8.3)

## 2013-07-23 LAB — CBC
HCT: 39 % (ref 39.0–52.0)
Hemoglobin: 13.7 g/dL (ref 13.0–17.0)
MCH: 33 pg (ref 26.0–34.0)
MCHC: 35.1 g/dL (ref 30.0–36.0)
MCV: 94 fL (ref 78.0–100.0)
Platelets: 212 10*3/uL (ref 150–400)
RBC: 4.15 MIL/uL — ABNORMAL LOW (ref 4.22–5.81)
RDW: 13.5 % (ref 11.5–15.5)
WBC: 5.4 10*3/uL (ref 4.0–10.5)

## 2013-07-23 LAB — I-STAT TROPONIN, ED
Troponin i, poc: 0 ng/mL (ref 0.00–0.08)
Troponin i, poc: 0.02 ng/mL (ref 0.00–0.08)

## 2013-07-23 IMAGING — CR DG CHEST 2V
2 series · 2 of 2 positions shown · non-contrast
Comparison: DG CHEST 2 VIEW dated [DATE]

CLINICAL DATA: Chest pains, possible inhalational injury, history
of hypertension and tobacco use

EXAM:
CHEST  2 VIEW

[w chest pa]
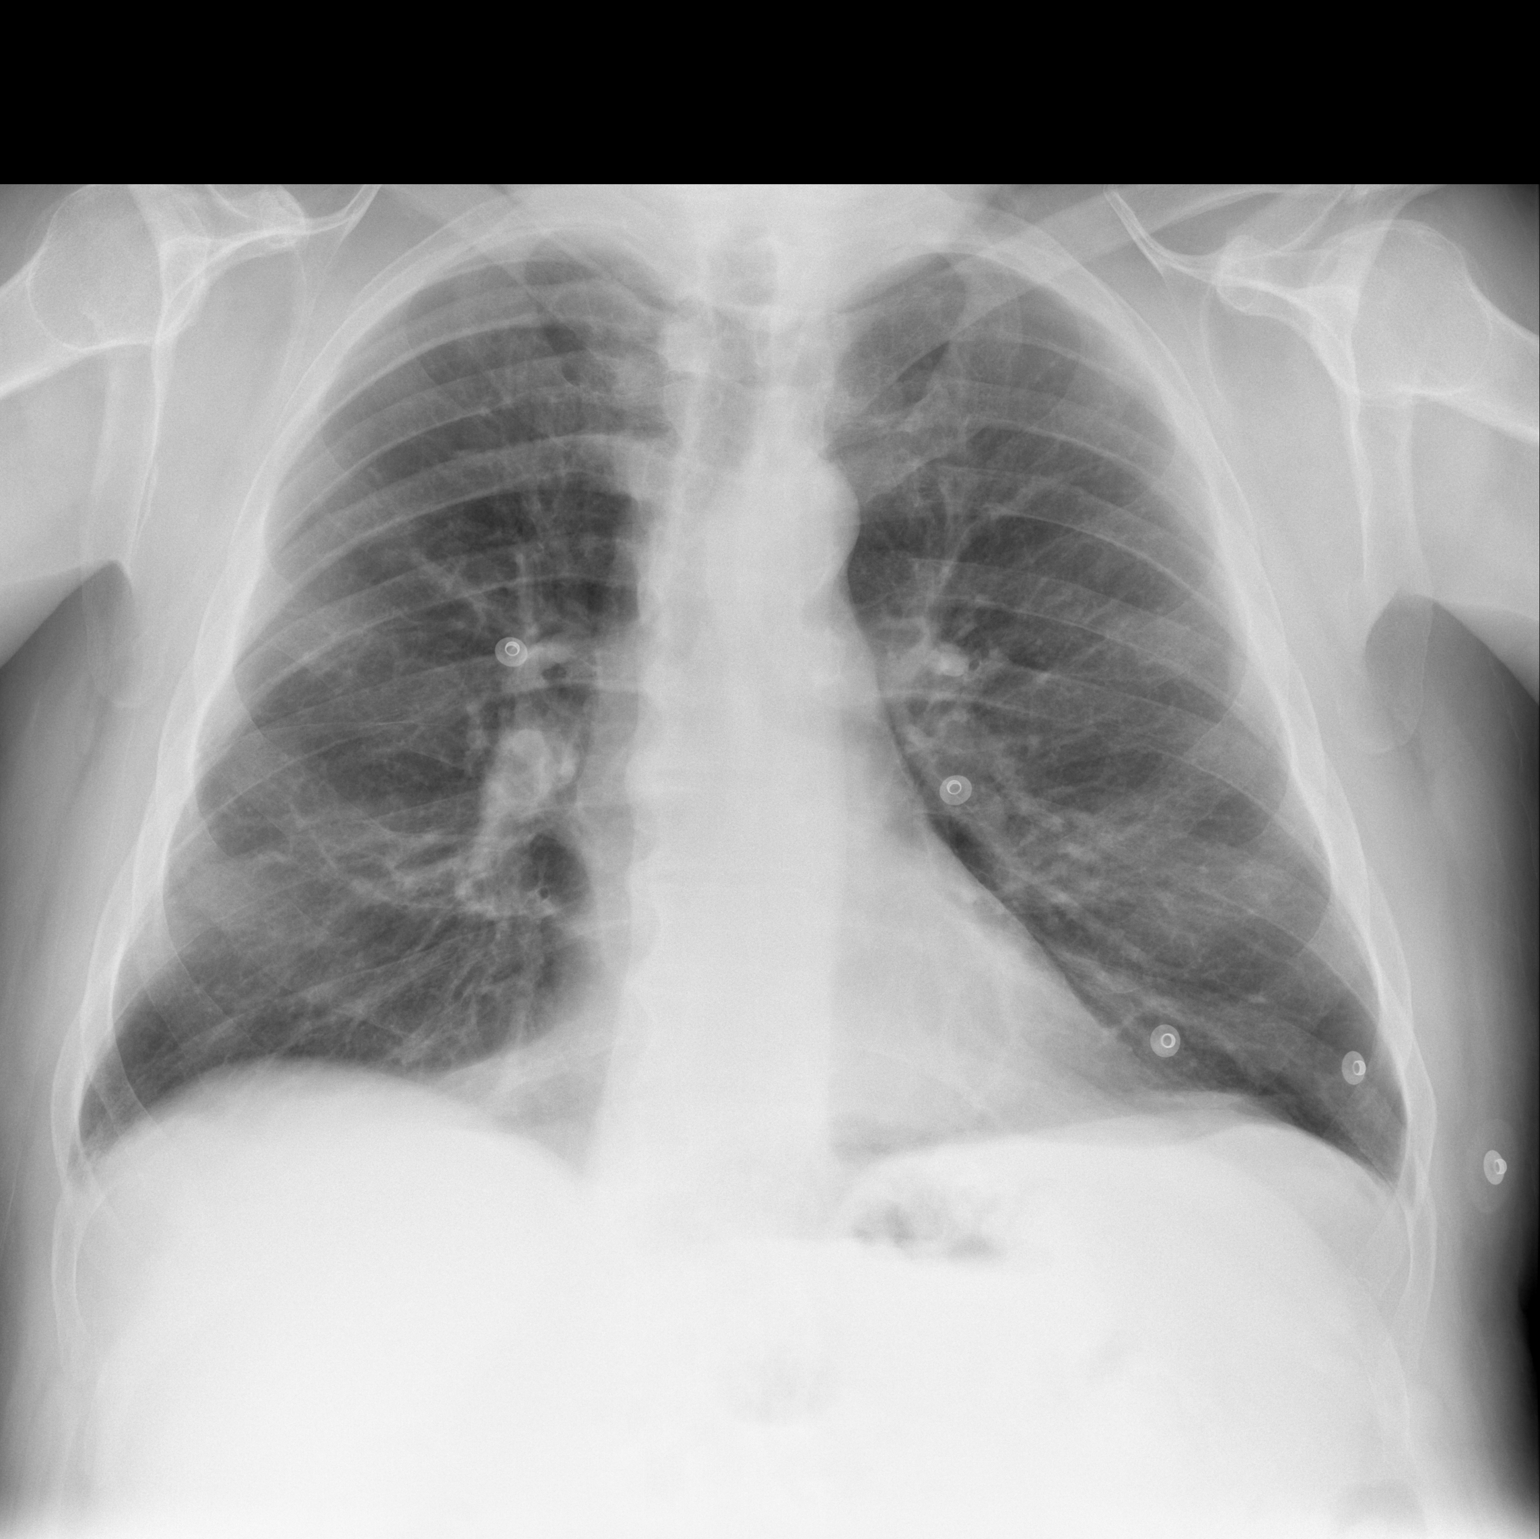

[w chest lat]
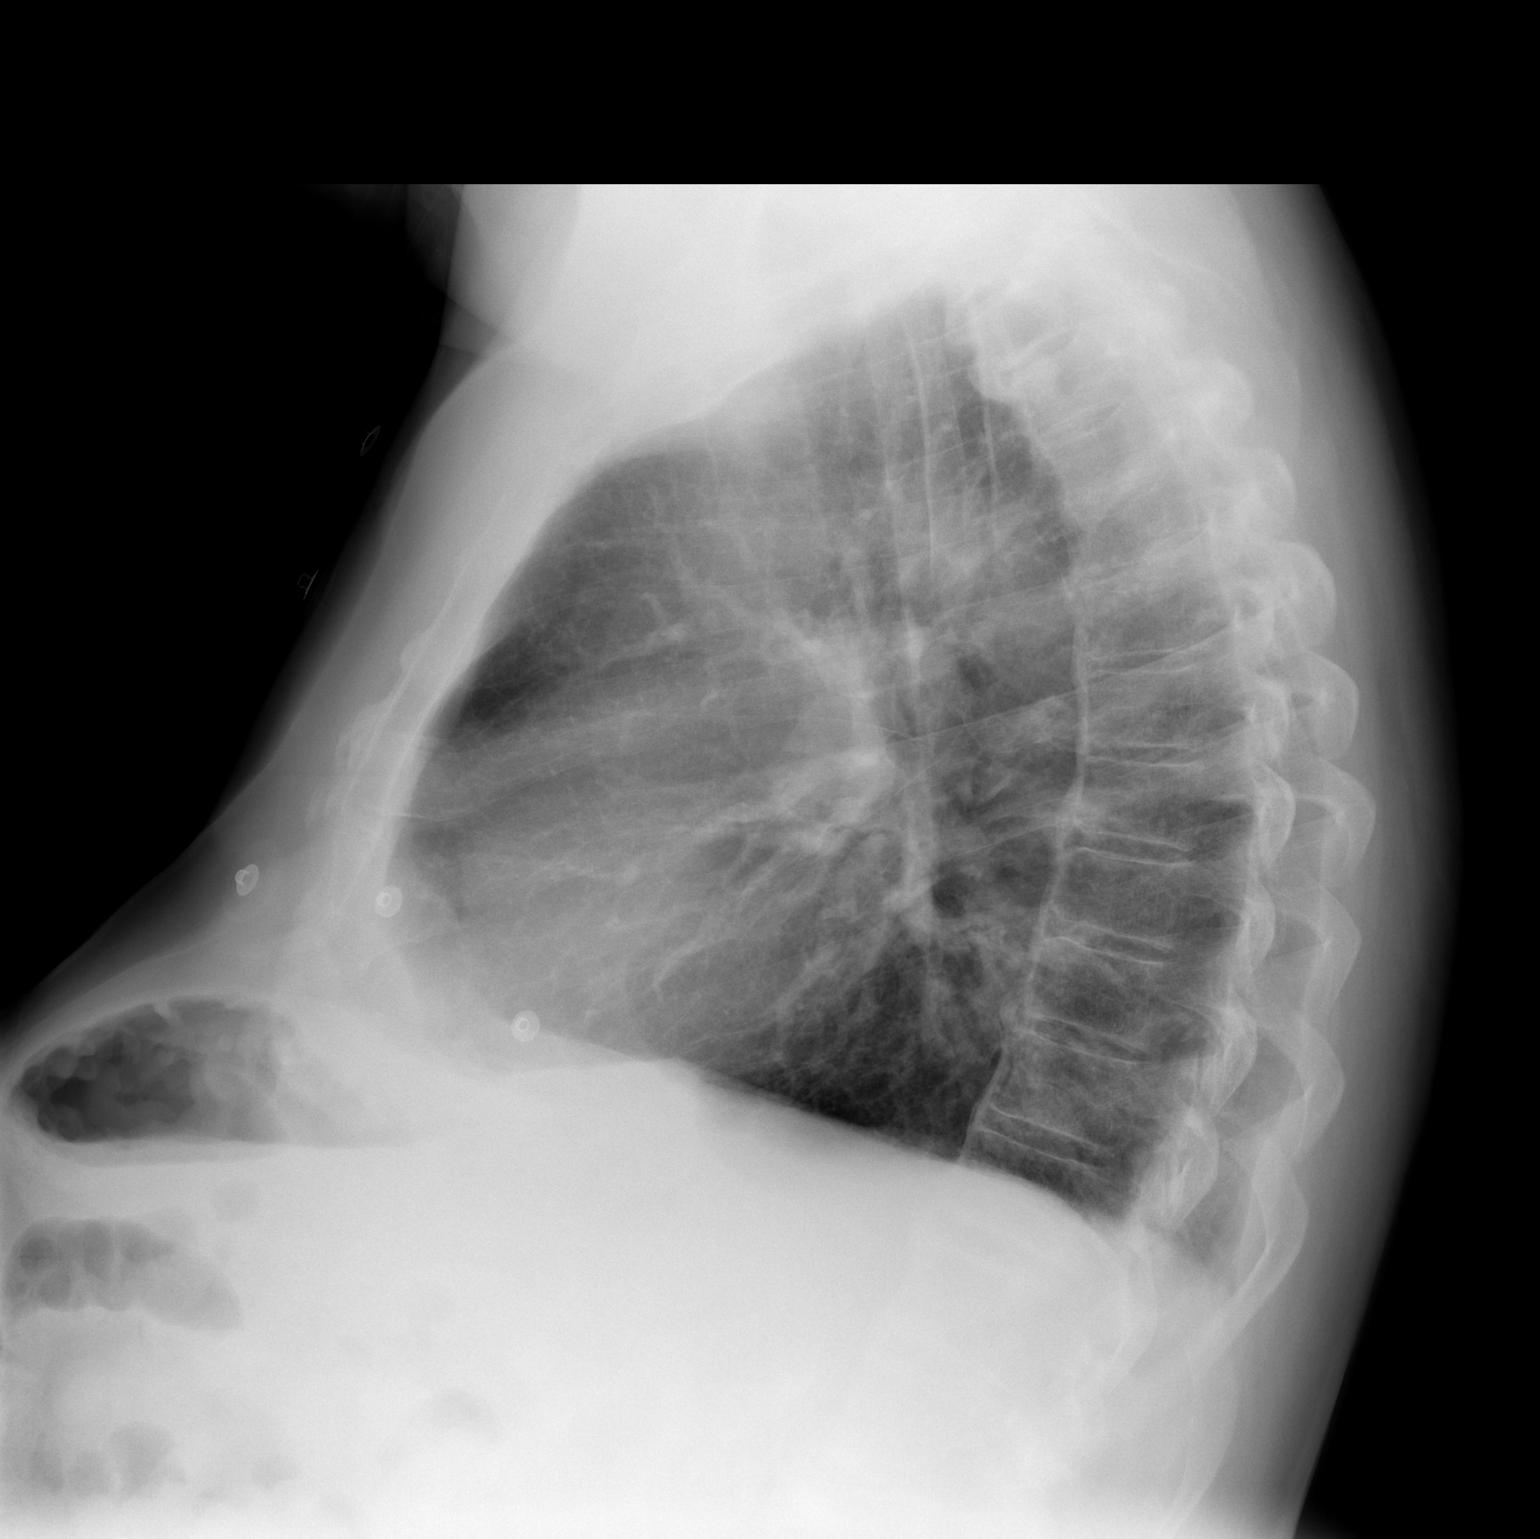

[2 of 2 positions shown; findings below may reference images not displayed]

FINDINGS: The lungs are mildly hyperinflated. There are coarse interstitial
markings bilaterally which are stable. There is no interstitial or
alveolar infiltrate. There is no pleural effusion. The cardiac
silhouette is normal in size. The pulmonary vascularity is not
engorged. The mediastinum is normal in width. There is no pleural
effusion. There is calcification of the anterior longitudinal
ligament of the thoracic spine.
IMPRESSION: There is mild hyperinflation of the lungs consistent with COPD.
Minimal fibrotic changes are present and stable. There is no
evidence of pneumonia nor other acute cardiopulmonary disease.

## 2013-07-23 NOTE — ED Notes (Signed)
Family at bedside. 

## 2013-07-23 NOTE — ED Notes (Signed)
Gave pt ginger ale.  

## 2013-07-23 NOTE — ED Provider Notes (Signed)
6:14 PM care transferred to me. Patient's second troponin is negative. He has no current chest pain. 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 At this point he has cardiology followup scheduled and feels comfortable going home. He understands return precautions.  Ephraim Hamburger, MD 07/23/13 [phone]

## 2013-07-23 NOTE — ED Provider Notes (Signed)
CSN: [id number]     Arrival date & time 07/23/13  1216 History   First MD Initiated Contact with Patient 07/23/13 1302     Chief Complaint  Patient presents with  . Chest Pain     (Consider location/radiation/quality/duration/timing/severity/associated sxs/prior Treatment) Patient is a 73 y.o. male presenting with chest pain. The history is provided by the patient. No language interpreter was used.  Chest Pain Pain location:  Substernal area Pain quality: pressure and sharp (3 seconds of sharp pain)   Pain radiates to:  Does not radiate Pain radiates to the back: no   Pain severity:  Mild Onset quality:  Sudden Duration:  30 minutes Timing:  Constant Progression:  Resolved Chronicity:  New Context comment:  While pumping gas Relieved by:  Aspirin (inhaler) Worsened by:  Nothing tried Ineffective treatments:  None tried Associated symptoms: shortness of breath   Associated symptoms: no abdominal pain, no anorexia, no back pain, no cough, no diaphoresis, no dizziness, no dysphagia, no fatigue, no fever, no headache, no nausea, no numbness, not vomiting and no weakness   Associated symptoms comment:  Numbness BL hands Shortness of breath:    Severity:  Mild   Onset quality:  Sudden   Duration: 30 mins.   Timing:  Constant   Progression:  Resolved Risk factors: hypertension, male sex and smoking   Risk factors: no coronary artery disease, no diabetes mellitus and no high cholesterol     Past Medical History  Diagnosis Date  . Allergic rhinitis, cause unspecified   . Asthma   . Hypertension   . PUD (peptic ulcer disease)     avoids aspirin  . Depression   . Anxiety    Past Surgical History  Procedure Laterality Date  . Nasal septum surgery  1960's   Family History  Problem Relation Age of Onset  . Alcohol abuse Father     commited suicide  . Heart disease Father     had bypass  . Breast cancer Mother   . Prostate cancer Maternal Uncle     several    History   Substance Use Topics  . Smoking status: Former Smoker -- 0.50 packs/day for 16 years    Types: Cigarettes    Quit date: 04/12/2011  . Smokeless tobacco: Never Used  . Alcohol Use: Yes    Review of Systems  Constitutional: Negative for fever, diaphoresis, activity change, appetite change and fatigue.  HENT: Negative for congestion, facial swelling, rhinorrhea and trouble swallowing.   Eyes: Negative for photophobia and pain.  Respiratory: Positive for shortness of breath. Negative for cough and chest tightness.   Cardiovascular: Positive for chest pain. Negative for leg swelling.  Gastrointestinal: Negative for nausea, vomiting, abdominal pain, diarrhea, constipation and anorexia.  Endocrine: Negative for polydipsia and polyuria.  Genitourinary: Negative for dysuria, urgency, decreased urine volume and difficulty urinating.  Musculoskeletal: Negative for back pain and gait problem.  Skin: Negative for color change, rash and wound.  Allergic/Immunologic: Negative for immunocompromised state.  Neurological: Negative for dizziness, facial asymmetry, speech difficulty, weakness, numbness and headaches.  Psychiatric/Behavioral: Negative for confusion, decreased concentration and agitation.      Allergies  Aspirin  Home Medications   Current Outpatient Rx  Name  Route  Sig  Dispense  Refill  . acetaminophen (TYLENOL) 325 MG tablet   Oral   Take 650 mg by mouth every 6 (six) hours as needed for mild pain.         [name] ADVAIR DISKUS  250-50 MCG/DOSE AEPB      INHALE 1 PUFF BY MOUTH TWICE DAILY. THEN RINSE MOUTH   1 each   5   . ALPRAZolam (XANAX) 0.25 MG tablet   Oral   Take 0.25 mg by mouth daily as needed for anxiety.          [name] amLODipine (NORVASC) 10 MG tablet   Oral   Take 1 tablet by mouth Daily.         . hydrochlorothiazide (MICROZIDE) 12.5 MG capsule   Oral   Take 12.5 mg by mouth daily.          [name] omeprazole (PRILOSEC) 20 MG capsule   Oral   Take 20 mg  by mouth daily.           . sertraline (ZOLOFT) 50 MG tablet   Oral   Take 50 mg by mouth daily.          [name] SPIRIVA HANDIHALER 18 MCG inhalation capsule      INHALE CONTENTS OF ONE CAPSULE BY MOUTH ONCE DAILY   30 capsule   6   . valsartan (DIOVAN) 320 MG tablet   Oral   Take 320 mg by mouth daily.         . VENTOLIN HFA 108 (90 BASE) MCG/ACT inhaler      INHALE TWO PUFFS BY MOUTH EVERY FOUR HOURS AS NEEDED FOR WHEEZING OR SHORTNESS OF BREATH   18 g   0    BP 135/71  Pulse 72  Temp(Src) 98 F (36.7 C) (Oral)  Resp 15  Ht 5\' 6"  (1.676 m)  Wt 204 lb (92.534 kg)  BMI 32.94 kg/m2  SpO2 100% Physical Exam  Constitutional: He is oriented to person, place, and time. He appears well-developed and well-nourished. No distress.  HENT:  Head: Normocephalic and atraumatic.  Mouth/Throat: No oropharyngeal exudate.  Eyes: Pupils are equal, round, and reactive to light.  Neck: Normal range of motion. Neck supple.  Cardiovascular: Normal rate, regular rhythm and normal heart sounds.  Exam reveals no gallop and no friction rub.   No murmur heard. Pulmonary/Chest: Effort normal and breath sounds normal. No respiratory distress. He has no wheezes. He has no rales.  Abdominal: Soft. Bowel sounds are normal. He exhibits no distension and no mass. There is no tenderness. There is no rebound and no guarding.  Musculoskeletal: Normal range of motion. He exhibits no edema and no tenderness.  Neurological: He is alert and oriented to person, place, and time.  Skin: Skin is warm and dry.  Psychiatric: He has a normal mood and affect.    ED Course  Procedures (including critical care time) Labs Review Labs Reviewed  CBC - Abnormal; Notable for the following:    RBC 4.15 (*)    All other components within normal limits  COMPREHENSIVE METABOLIC PANEL - Abnormal; Notable for the following:    Glucose, Bld 169 (*)    Total Bilirubin 0.2 (*)    GFR calc non Af Amer 84 (*)    All other  components within normal limits  I-STAT TROPOININ, ED  Randolm Idol, ED   Imaging Review Dg Chest 2 View  07/23/2013   CLINICAL DATA:  Chest pains, possible inhalational injury, history of hypertension and tobacco use  EXAM: CHEST  2 VIEW  COMPARISON:  DG CHEST 2 VIEW dated 12/10/2011  FINDINGS: The lungs are mildly hyperinflated. There are coarse interstitial markings bilaterally which are stable. There is no interstitial or alveolar  infiltrate. There is no pleural effusion. The cardiac silhouette is normal in size. The pulmonary vascularity is not engorged. The mediastinum is normal in width. There is no pleural effusion. There is calcification of the anterior longitudinal ligament of the thoracic spine.  IMPRESSION: There is mild hyperinflation of the lungs consistent with COPD. Minimal fibrotic changes are present and stable. There is no evidence of pneumonia nor other acute cardiopulmonary disease.   Electronically Signed   By: David  Martinique   On: 07/23/2013 13:21     EKG Interpretation   Date/Time:  Friday July 23 2013 12:20:37 EDT Ventricular Rate:  78 PR Interval:  161 QRS Duration: 94 QT Interval:  383 QTC Calculation: 436 R Axis:   58 Text Interpretation:  Age not entered, assumed to be  73 years old for  purpose of ECG interpretation Sinus rhythm Low voltage, extremity leads  Unchanged from prior Confirmed by DOCHERTY  MD, Rincon [phone]) on 07/23/2013  11:02:47 PM      MDM   Final diagnoses:  Chest pain    Pt is a 73 y.o. male with Pmhx as above who presents with about 30 mins of CP while pumping gas around 11Am this afternoon. Pain initially sharp for about 3 seconds, then was dull for about 30 mins. He also had some tingling in BL hands, mild nausea, mild SOB.  Pain resolved after wife gave his ASA dn and used his inhaler.  On PT, VSS, pt in NAD, and pt is asymptomatic.  Cardiopulm and abdominal exam benign.  EKG w/o acute ischemic changes.  CXR w/o acute findings.   First trop negative.Doubt ACS, though pt does have risk factors for CAD and I believe would benefit from outpt stress testing.   I have spoken to pt and family about admission for observation vs delta trop and close outpt cardiology follow up.  Pt does not want to stay and agrees for the later plan. Spoke with cardiology dept who will schedule him close outpt f/u for stress testing.  Care transferred to Dr. Regenia Skeeter, repeat trop to be done at 5pm.          Neta Ehlers, MD 07/24/13 [phone]

## 2013-07-23 NOTE — ED Notes (Signed)
Pt was pumping fuel and started to have chest pressure in center of chest that radiated to L arm. No N/V. Pt did have SOB at the time. Sats 96% on room air. EMS gave 324 ASA. Chest pressure relieved. EKG showed NSR HR 87. BP 125/68

## 2013-07-23 NOTE — Discharge Instructions (Signed)
Chest Pain (Nonspecific) °It is often hard to give a specific diagnosis for the cause of chest pain. There is always a chance that your pain could be related to something serious, such as a heart attack or a blood clot in the lungs. You need to follow up with your caregiver for further evaluation. °CAUSES  °· Heartburn. °· Pneumonia or bronchitis. °· Anxiety or stress. °· Inflammation around your heart (pericarditis) or lung (pleuritis or pleurisy). °· A blood clot in the lung. °· A collapsed lung (pneumothorax). It can develop suddenly on its own (spontaneous pneumothorax) or from injury (trauma) to the chest. °· Shingles infection (herpes zoster virus). °The chest wall is composed of bones, muscles, and cartilage. Any of these can be the source of the pain. °· The bones can be bruised by injury. °· The muscles or cartilage can be strained by coughing or overwork. °· The cartilage can be affected by inflammation and become sore (costochondritis). °DIAGNOSIS  °Lab tests or other studies, such as X-rays, electrocardiography, stress testing, or cardiac imaging, may be needed to find the cause of your pain.  °TREATMENT  °· Treatment depends on what may be causing your chest pain. Treatment may include: °· Acid blockers for heartburn. °· Anti-inflammatory medicine. °· Pain medicine for inflammatory conditions. °· Antibiotics if an infection is present. °· You may be advised to change lifestyle habits. This includes stopping smoking and avoiding alcohol, caffeine, and chocolate. °· You may be advised to keep your head raised (elevated) when sleeping. This reduces the chance of acid going backward from your stomach into your esophagus. °· Most of the time, nonspecific chest pain will improve within 2 to 3 days with rest and mild pain medicine. °HOME CARE INSTRUCTIONS  °· If antibiotics were prescribed, take your antibiotics as directed. Finish them even if you start to feel better. °· For the next few days, avoid physical  activities that bring on chest pain. Continue physical activities as directed. °· Do not smoke. °· Avoid drinking alcohol. °· Only take over-the-counter or prescription medicine for pain, discomfort, or fever as directed by your caregiver. °· Follow your caregiver's suggestions for further testing if your chest pain does not go away. °· Keep any follow-up appointments you made. If you do not go to an appointment, you could develop lasting (chronic) problems with pain. If there is any problem keeping an appointment, you must call to reschedule. °SEEK MEDICAL CARE IF:  °· You think you are having problems from the medicine you are taking. Read your medicine instructions carefully. °· Your chest pain does not go away, even after treatment. °· You develop a rash with blisters on your chest. °SEEK IMMEDIATE MEDICAL CARE IF:  °· You have increased chest pain or pain that spreads to your arm, neck, jaw, back, or abdomen. °· You develop shortness of breath, an increasing cough, or you are coughing up blood. °· You have severe back or abdominal pain, feel nauseous, or vomit. °· You develop severe weakness, fainting, or chills. °· You have a fever. °THIS IS AN EMERGENCY. Do not wait to see if the pain will go away. Get medical help at once. Call your local emergency services (911 in [place] Do not drive yourself to the hospital. °MAKE SURE YOU:  °· Understand these instructions. °· Will watch your condition. °· Will get help right away if you are not doing well or get worse. °Document Released: 02/06/2005 Document Revised: 07/22/2011 Document Reviewed: 12/03/2007 °[employer]® Patient Information ©2014 [employer]   [employer]. ° °

## 2013-08-16 ENCOUNTER — Ambulatory Visit: Payer: Medicare Other | Admitting: Cardiovascular Disease

## 2013-09-21 ENCOUNTER — Other Ambulatory Visit: Payer: Self-pay | Admitting: Internal Medicine

## 2013-11-29 ENCOUNTER — Other Ambulatory Visit: Payer: Self-pay | Admitting: Internal Medicine

## 2013-12-29 ENCOUNTER — Other Ambulatory Visit: Payer: Self-pay | Admitting: Internal Medicine

## 2014-01-12 ENCOUNTER — Other Ambulatory Visit: Payer: Self-pay | Admitting: Internal Medicine

## 2014-02-04 ENCOUNTER — Ambulatory Visit (INDEPENDENT_AMBULATORY_CARE_PROVIDER_SITE_OTHER): Payer: Medicare Other | Admitting: Internal Medicine

## 2014-02-04 ENCOUNTER — Encounter: Payer: Self-pay | Admitting: Internal Medicine

## 2014-02-04 ENCOUNTER — Other Ambulatory Visit: Payer: Self-pay | Admitting: Internal Medicine

## 2014-02-04 VITALS — BP 134/64 | HR 71 | Ht 66.0 in | Wt 205.8 lb

## 2014-02-04 DIAGNOSIS — Z23 Encounter for immunization: Secondary | ICD-10-CM

## 2014-02-04 DIAGNOSIS — J449 Chronic obstructive pulmonary disease, unspecified: Secondary | ICD-10-CM

## 2014-02-04 DIAGNOSIS — F17200 Nicotine dependence, unspecified, uncomplicated: Secondary | ICD-10-CM

## 2014-02-04 DIAGNOSIS — J4489 Other specified chronic obstructive pulmonary disease: Secondary | ICD-10-CM

## 2014-02-04 NOTE — Patient Instructions (Signed)
Flu vax  Sample Dulera 100    2 puffs then rinse mouth, twice daily     Try this instead of Advair for comparison. Check for comparison cost with your insurance formulary or pharmacist. When you run out of the sample, go back to Advair as before.

## 2014-02-04 NOTE — Assessment & Plan Note (Signed)
Severe obstructive airways disease with some reactive component. We want to see if Glen [name] can be cheaper and can kick in more quickly than Advair Plan-samples Dulera 100, flu vaccine

## 2014-02-04 NOTE — Progress Notes (Signed)
Subjective:    Patient ID: Glen [name], male    DOB: 1941/03/27, 73 y.o.   MRN: [id number]  HPI 11/15/10- 47 yo M smoker followed for asthma/ COPD, rhinitis, hx urticaria, complicated by HBP,  Last here January 01, 2010 - note reviewed Since here had colonoscopy, rectal polyp and cataract surgeries- with no respiratory problems.  Acute visit. He put out a lot of dusty pine needles 1 week ago. Was coughing a little at routine PhYsEx but has gotten steadily tighter. Cough productive light yellow. Denies chest pain, blood, fever, purulent.  Admits bothersome nasal pressure and postnasal drip.  Admits only rare cigarette now.   12/10/11- 69 yo M smoker followed for asthma/ COPD, rhinitis, hx urticaria, complicated by HBP, 1 year follow-up. Patient states better since last visit. c/o sob occassionally, little wheezing and coughing.  Denies chest pain and chest tightness.  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 Had pneumonia vaccine at his primary physician office. COPD assessment test (CAT) score 17/40  12/16/12- 64 yo M  Former smoker followed for asthma/ COPD, rhinitis, hx urticaria, complicated by HBP FOLLOWS FOR: SOB at times still-worse with activity.  "not bad". No change since last visit. Short of breath especially with lifting or using his arms, mowing the lawn etc. More cough in the last few weeks with "cloudy" sputum but no fever, blood, chest pain. CXR 12/10/12 IMPRESSION:  Minimal bibasilar atelectasis and/or scarring.  Original Report Authenticated By: Luretha Rued, M.D.  02/04/14- 73 yo M  Former smoker followed for asthma/ COPD, rhinitis, hx urticaria, complicated by HBP FOLLOWS FOR: recently sick with virus that was going around-was treated by PCP; otherwise only having SOB at times. Resolved an acute bronchitis  after treatment by his primary physician with prednisone, nebulizer treatment, Z-Pak. Now feels at baseline.discussed prednisone Advair and the relatively long time before effect of a dose is noticed. Had negative workup for a chest pain episode. PFT-02/26/2013-severe obstructive airways disease with response to bronchodilator, air trapping, normal diffusion. FEV1/FVC 0.52, TLC 93%, DLCO 87%. CXR 07/23/13  IMPRESSION:  There is mild hyperinflation of the lungs consistent with COPD.  Minimal fibrotic changes are present and stable. There is no  evidence of pneumonia nor other acute cardiopulmonary disease.  Electronically Signed  By: David Martinique  On: 07/23/2013 13:21   ROS-see HPI Constitutional:   No-   weight loss, night sweats, fevers, chills, +fatigue, lassitude. HEENT:   No-  headaches, difficulty swallowing, tooth/dental problems, sore throat,       No-  sneezing, itching, ear ache, nasal congestion, post nasal drip,  CV:  No-   chest pain, orthopnea, PND, swelling in lower extremities, anasarca, dizziness, palpitations Resp: +  shortness of breath with exertion or at rest.          No- productive cough,  + non-productive cough,  No- coughing up of blood.              No-   change in color of mucus.  No- wheezing.   Skin: No-   rash or lesions. GI:  No-   heartburn, indigestion, abdominal pain, nausea, vomiting,  GU: . MS:  No-   joint pain or swelling.  . Neuro-     nothing unusual Psych:  No- change in mood or affect. + depression or anxiety.  No  memory loss.  OBJ- Physical Exam General- Alert, Oriented, Affect-appropriate, Distress- none acute, overweight Skin- rash-none, lesions- none, excoriation- none Lymphadenopathy- none Head- atraumatic            Eyes- Gross vision intact, PERRLA, conjunctivae and secretions clear            Ears- Hearing, canals-normal            Nose- Clear, no-Septal dev, mucus, polyps, erosion, perforation             Throat- Mallampati II ,  mucosa clear , drainage- none, tonsils- atrophic Neck- flexible , trachea midline, no stridor , thyroid nl, carotid no bruit Chest - symmetrical excursion , unlabored           Heart/CV- RRR , no murmur , no gallop  , no rub, nl s1 s2                           - JVD- none , edema- none, stasis changes- none, varices- none           Lung- clear/distant, wheeze- none, cough- none , dullness-none, rub- none           Chest wall-  Abd-  Br/ Gen/ Rectal- Not done, not indicated Extrem- cyanosis- none, clubbing, none, atrophy- none, strength- nl Neuro- grossly intact to observation Assessment & Plan:

## 2014-02-04 NOTE — Assessment & Plan Note (Signed)
Successfully remaining off of smoking

## 2014-03-14 ENCOUNTER — Other Ambulatory Visit: Payer: Self-pay | Admitting: Internal Medicine

## 2014-04-17 ENCOUNTER — Other Ambulatory Visit: Payer: Self-pay | Admitting: Internal Medicine

## 2014-05-18 ENCOUNTER — Other Ambulatory Visit: Payer: Self-pay | Admitting: Internal Medicine

## 2014-05-30 ENCOUNTER — Other Ambulatory Visit: Payer: Self-pay | Admitting: Internal Medicine

## 2014-09-28 ENCOUNTER — Other Ambulatory Visit: Payer: Self-pay | Admitting: Internal Medicine

## 2014-12-02 ENCOUNTER — Other Ambulatory Visit: Payer: Self-pay | Admitting: Internal Medicine

## 2014-12-08 ENCOUNTER — Other Ambulatory Visit: Payer: Self-pay | Admitting: Internal Medicine

## 2014-12-09 ENCOUNTER — Telehealth: Payer: Self-pay | Admitting: Emergency Medicine

## 2014-12-09 NOTE — Telephone Encounter (Signed)
Error-Disregard message

## 2015-01-04 ENCOUNTER — Other Ambulatory Visit: Payer: Self-pay | Admitting: Family Medicine

## 2015-01-04 DIAGNOSIS — F17209 Nicotine dependence, unspecified, with unspecified nicotine-induced disorders: Secondary | ICD-10-CM

## 2015-01-08 ENCOUNTER — Other Ambulatory Visit: Payer: Self-pay | Admitting: Internal Medicine

## 2015-01-09 ENCOUNTER — Other Ambulatory Visit: Payer: Self-pay | Admitting: Internal Medicine

## 2015-01-18 ENCOUNTER — Ambulatory Visit
Admission: RE | Admit: 2015-01-18 | Discharge: 2015-01-18 | Disposition: A | Discharge status: Home / Self Care | Payer: Medicare Other | Source: Ambulatory Visit | Attending: Family Medicine | Admitting: Family Medicine

## 2015-01-18 ENCOUNTER — Other Ambulatory Visit: Payer: Self-pay | Admitting: Family Medicine

## 2015-01-18 DIAGNOSIS — R059 Cough, unspecified: Secondary | ICD-10-CM

## 2015-01-18 DIAGNOSIS — R05 Cough: Secondary | ICD-10-CM

## 2015-01-18 DIAGNOSIS — F17209 Nicotine dependence, unspecified, with unspecified nicotine-induced disorders: Secondary | ICD-10-CM

## 2015-01-18 IMAGING — CT CT CHEST W/O CM
3 of 4 series · 16 of 30 positions shown, 17 images · non-contrast
Comparison: None.

CLINICAL DATA: Cough.  COPD.

EXAM:
CT CHEST WITHOUT CONTRAST
TECHNIQUE: Multidetector CT imaging of the chest was performed following the
standard protocol without IV contrast.

[Series 3: chest w/o · axial · non-contrast · 0.78mm/px · z∈[-249,-59]mm · 4 of 64 slices shown, 5 images]
[im 13/64  mediastinal]
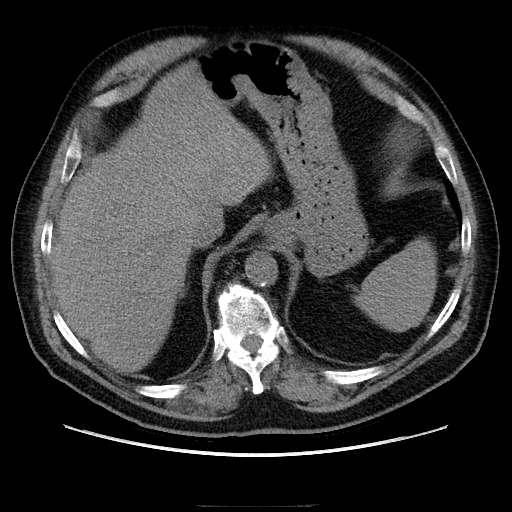
[im 13/64  lung]
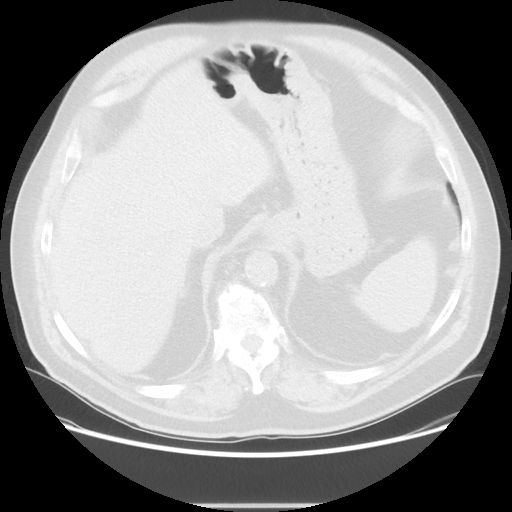
[im 26/64  lung]
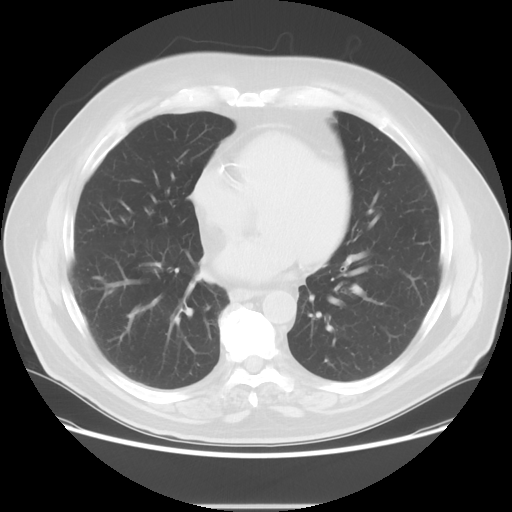
[im 38/64  lung]
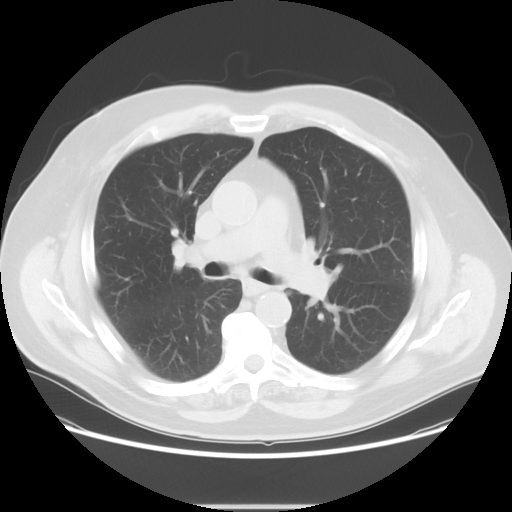
[im 51/64  lung]
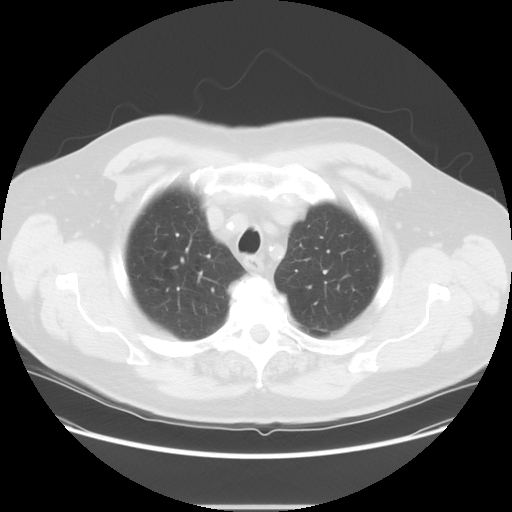

[Series 4: lung windows · axial · 0.78mm/px · z∈[-249,-59]mm · 4 of 64 slices shown]
[im 13/64  lung]
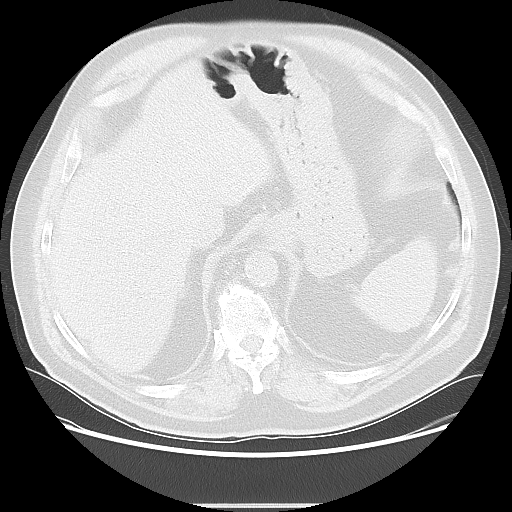
[im 26/64  lung]
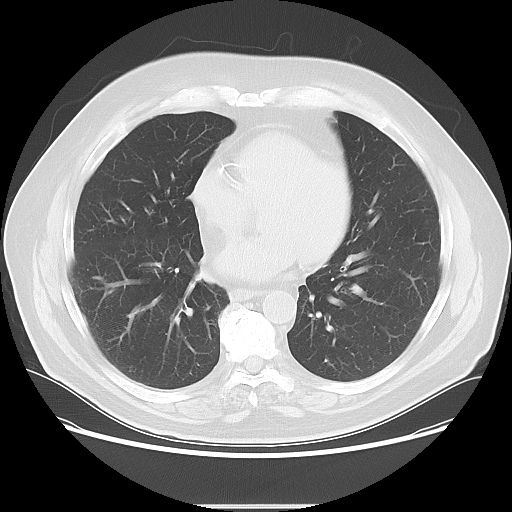
[im 38/64  lung]
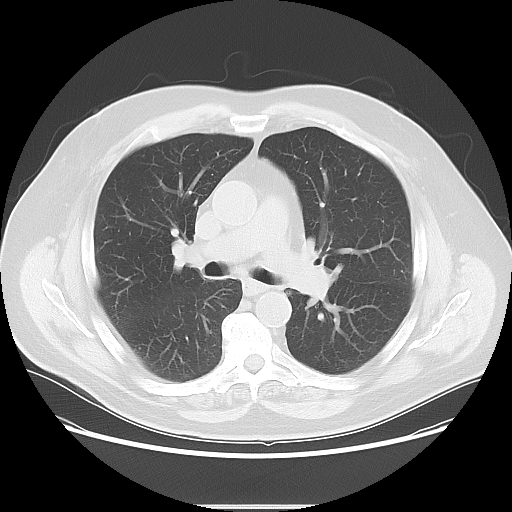
[im 51/64  lung]
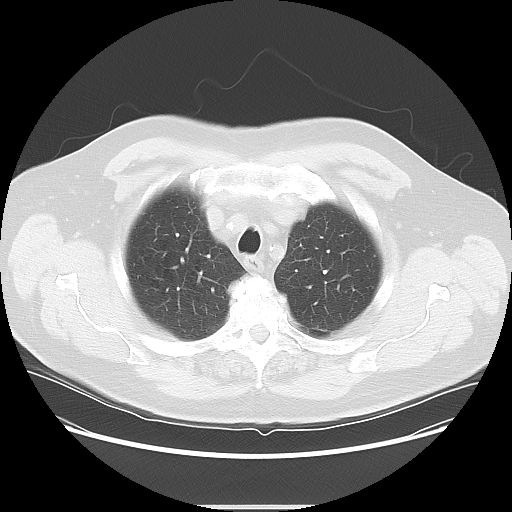

[Series 602: sagittal body · sagittal · 0.78mm/px · 8 of 161 slices shown]
[im 11/161  mediastinal]
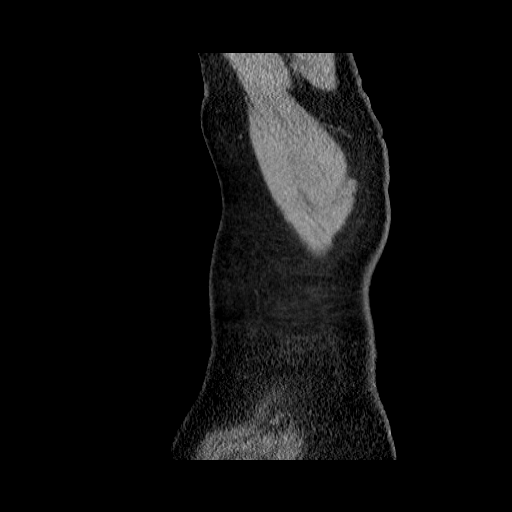
[im 33/161  mediastinal]
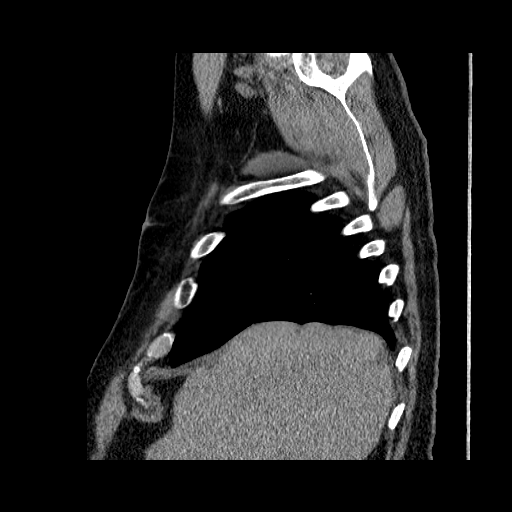
[im 54/161  mediastinal]
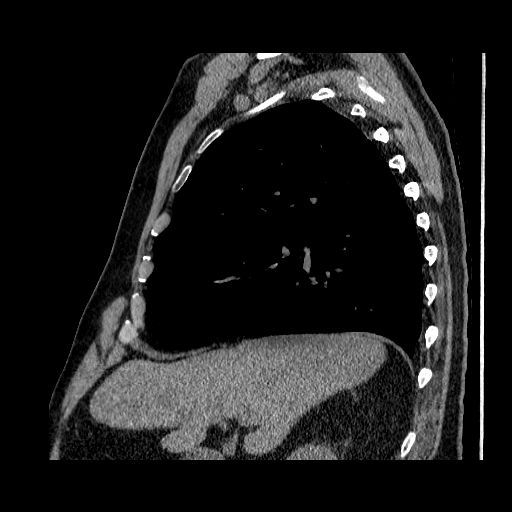
[im 75/161  mediastinal]
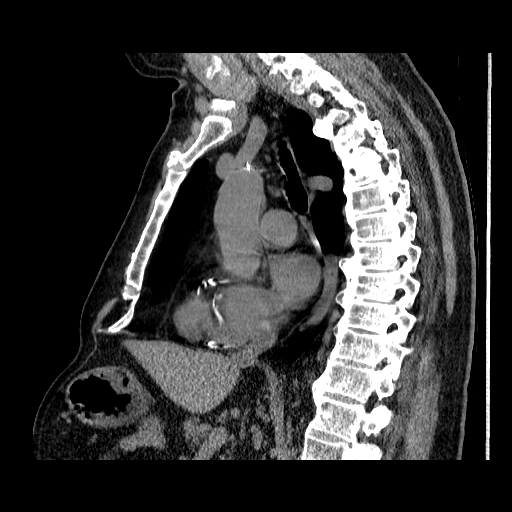
[im 86/161  mediastinal]
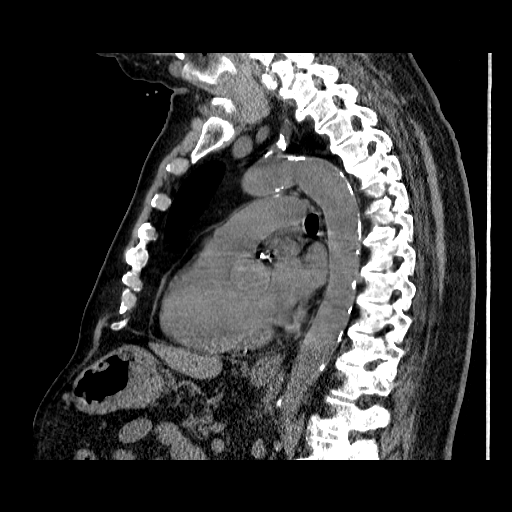
[im 107/161  mediastinal]
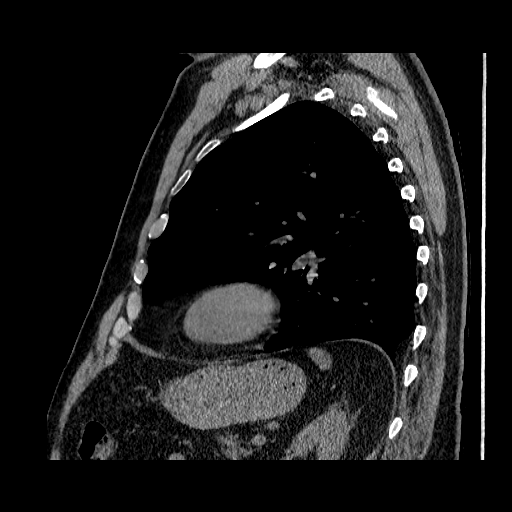
[im 129/161  mediastinal]
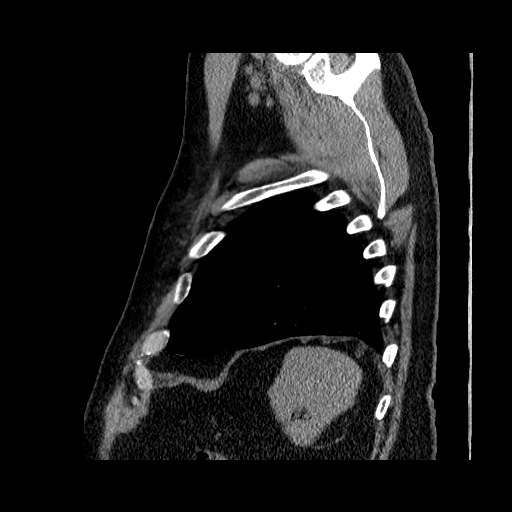
[im 150/161  mediastinal]
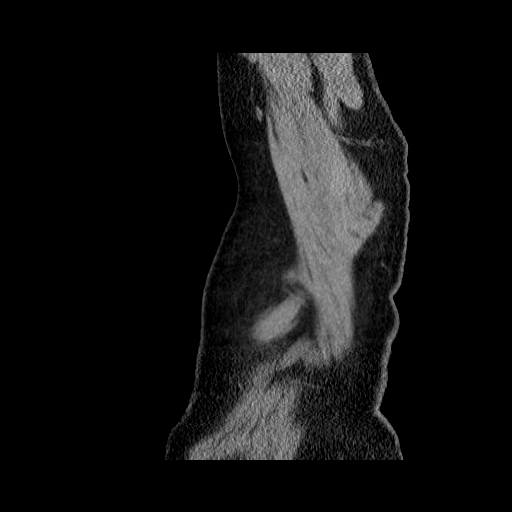

[16 of 30 positions shown; findings below may reference images not displayed]

FINDINGS: THORACIC INLET/BODY WALL:

No acute abnormality.

MEDIASTINUM:

Normal heart size. No pericardial effusion. Extensive
atherosclerosis with calcification throughout the right and left
coronary circulation. No acute vascular abnormality. No adenopathy.

LUNG WINDOWS:

There is no edema, consolidation, effusion, or pneumothorax.

Hyperinflation with mild apical centrilobular emphysema. Mild
diffuse bronchial wall thickening.

Tiny bilateral pulmonary nodules, 4 mm in the right upper lobe on
image 11 and 4 mm in left lower lobe on image 36 (better seen
coronally).

UPPER ABDOMEN:

No acute findings.

OSSEOUS:

Extensive spondylosis. No acute fracture. No suspicious lytic or
blastic lesions.
IMPRESSION: 1. Negative for pneumonia or other acute finding.
2. Mild centrilobular emphysema.
3. Tiny pulmonary nodules, up to 4 mm in the right upper lobe. Given
risk factors for bronchogenic carcinoma, follow-up chest CT at 1
year is recommended. This recommendation follows the consensus
statement: Guidelines for Management of Small Pulmonary Nodules
Detected on CT Scans: A Statement from the [HOSPITAL] as

## 2015-01-24 ENCOUNTER — Other Ambulatory Visit: Payer: Self-pay | Admitting: Internal Medicine

## 2015-02-06 ENCOUNTER — Ambulatory Visit (INDEPENDENT_AMBULATORY_CARE_PROVIDER_SITE_OTHER): Payer: Medicare Other | Admitting: Internal Medicine

## 2015-02-06 ENCOUNTER — Encounter: Payer: Self-pay | Admitting: Internal Medicine

## 2015-02-06 VITALS — BP 132/64 | HR 89 | Ht 66.0 in | Wt 207.2 lb

## 2015-02-06 DIAGNOSIS — J449 Chronic obstructive pulmonary disease, unspecified: Secondary | ICD-10-CM | POA: Diagnosis not present

## 2015-02-06 DIAGNOSIS — Z23 Encounter for immunization: Secondary | ICD-10-CM | POA: Diagnosis not present

## 2015-02-06 NOTE — Patient Instructions (Signed)
Flu vax  Try using your ventolin rescue inhaler when you get the upper airway discomfort. See if it helps.

## 2015-02-06 NOTE — Progress Notes (Signed)
Subjective:    Patient ID: Glen [name], male    DOB: 1940/08/21, 74 y.o.   MRN: [id number]  HPI 11/15/10- 27 yo M smoker followed for asthma/ COPD, rhinitis, hx urticaria, complicated by HBP,  Last here January 01, 2010 - note reviewed Since here had colonoscopy, rectal polyp and cataract surgeries- with no respiratory problems.  Acute visit. He put out a lot of dusty pine needles 1 week ago. Was coughing a little at routine PhYsEx but has gotten steadily tighter. Cough productive light yellow. Denies chest pain, blood, fever, purulent.  Admits bothersome nasal pressure and postnasal drip.  Admits only rare cigarette now.   12/10/11- 31 yo M smoker followed for asthma/ COPD, rhinitis, hx urticaria, complicated by HBP, 1 year follow-up. Patient states better since last visit. c/o sob occassionally, little wheezing and coughing.  Denies chest pain and chest tightness.  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 Had pneumonia vaccine at his primary physician office. COPD assessment test (CAT) score 17/40  12/16/12- 3 yo M  Former smoker followed for asthma/ COPD, rhinitis, hx urticaria, complicated by HBP FOLLOWS FOR: SOB at times still-worse with activity.  "not bad". No change since last visit. Short of breath especially with lifting or using his arms, mowing the lawn etc. More cough in the last few weeks with "cloudy" sputum but no fever, blood, chest pain. CXR 12/10/12 IMPRESSION:  Minimal bibasilar atelectasis and/or scarring.  Original Report Authenticated By: Luretha Rued, M.D.  02/04/14- 79 yo M  Former smoker followed for asthma/ COPD, rhinitis, hx urticaria, complicated by HBP FOLLOWS FOR: recently sick with virus that was going around-was treated by PCP; otherwise only having SOB at times. Resolved an acute bronchitis  after treatment by his primary physician with prednisone, nebulizer treatment, Z-Pak. Now feels at baseline.discussed prednisone Advair and the relatively long time before effect of a dose is noticed. Had negative workup for a chest pain episode. PFT-02/26/2013-severe obstructive airways disease with response to bronchodilator, air trapping, normal diffusion. FEV1/FVC 0.52, TLC 93%, DLCO 87%. CXR 07/23/13  IMPRESSION:  There is mild hyperinflation of the lungs consistent with COPD.  Minimal fibrotic changes are present and stable. There is no  evidence of pneumonia nor other acute cardiopulmonary disease.  Electronically Signed  By: David Martinique  On: 07/23/2013 13:21  02/06/15- 26 yo M  Former smoker followed for asthma/ COPD, rhinitis, hx urticaria, complicated by HBP Pt c/o worsening SOB, productive cough, wheezing, . Pt states having CT recently of chest at [facility] cough with clear mucus over the last month. Foreign body sensation intermittently upper trachea. We reviewed CT images done by his primary physician. Cardiac workup was negative. Admits occasional heartburn. Rarely uses rescue inhaler. Continues Advair and Spiriva.  CT chest 01/20/15- IMPRESSION: 1. Negative for pneumonia or other acute finding. 2. Mild centrilobular emphysema. 3. Tiny pulmonary nodules, up to 4 mm in the right upper lobe. Given risk factors for bronchogenic carcinoma, follow-up chest CT at 1 year is recommended. This recommendation follows the consensus statement: Guidelines for Management of Small Pulmonary Nodules Detected on CT Scans: A Statement from the [place] as published in Radiology 2005; 237:395-400. Electronically Signed  By: Monte Fantasia M.D.  On: 01/20/2015 08:26  ROS-see HPI Constitutional:   No-   weight loss, night sweats, fevers, chills, +fatigue, lassitude.  HEENT:   No-  headaches, difficulty swallowing, tooth/dental problems, sore throat,       No-  sneezing,  itching, ear ache, nasal congestion, post nasal drip,  CV:  No-   chest pain, orthopnea, PND, swelling in lower extremities, anasarca, dizziness, palpitations Resp: +  shortness of breath with exertion or at rest.          No- productive cough,  + non-productive cough,  No- coughing up of blood.              No-   change in color of mucus.  No- wheezing.   Skin: No-   rash or lesions. GI:  No-   heartburn, indigestion, abdominal pain, nausea, vomiting,  GU: . MS:  No-   joint pain or swelling.  . Neuro-     nothing unusual Psych:  No- change in mood or affect. + depression or anxiety.  No memory loss.  OBJ- Physical Exam General- Alert, Oriented, Affect-appropriate, Distress- none acute, overweight Skin- rash-none, lesions- none, excoriation- none Lymphadenopathy- none Head- atraumatic            Eyes- Gross vision intact, PERRLA, conjunctivae and secretions clear            Ears- Hearing, canals-normal            Nose- Clear, no-Septal dev, mucus, polyps, erosion, perforation             Throat- Mallampati II , mucosa clear , drainage- none, tonsils- atrophic Neck- flexible , trachea midline, no stridor , thyroid nl, carotid no bruit Chest - symmetrical excursion , unlabored           Heart/CV- RRR , no murmur , no gallop  , no rub, nl s1 s2                           - JVD- none , edema- none, stasis changes- none, varices- none           Lung- clear/distant, wheeze- none, cough- none , dullness-none, rub- none           Chest wall-  Abd-  Br/ Gen/ Rectal- Not done, not indicated Extrem- cyanosis- none, clubbing, none, atrophy- none, strength- nl Neuro- grossly intact to observation Assessment & Plan:

## 2015-02-09 ENCOUNTER — Other Ambulatory Visit: Payer: Self-pay | Admitting: Family Medicine

## 2015-02-09 DIAGNOSIS — F17211 Nicotine dependence, cigarettes, in remission: Secondary | ICD-10-CM

## 2015-02-11 NOTE — Assessment & Plan Note (Signed)
Question tracheitis or exacerbation of chronic bronchitis/COPD Plan-suggested he try using his rescue inhaler for the throat tickle cough while continuing reflux precautions. Flu vaccine.

## 2015-02-13 ENCOUNTER — Other Ambulatory Visit: Payer: Self-pay | Admitting: Internal Medicine

## 2015-06-08 ENCOUNTER — Encounter: Payer: Self-pay | Admitting: Internal Medicine

## 2015-06-08 ENCOUNTER — Ambulatory Visit (INDEPENDENT_AMBULATORY_CARE_PROVIDER_SITE_OTHER): Payer: Medicare Other | Admitting: Internal Medicine

## 2015-06-08 VITALS — BP 122/74 | HR 77 | Ht 66.0 in | Wt 212.0 lb

## 2015-06-08 DIAGNOSIS — F17201 Nicotine dependence, unspecified, in remission: Secondary | ICD-10-CM

## 2015-06-08 DIAGNOSIS — J449 Chronic obstructive pulmonary disease, unspecified: Secondary | ICD-10-CM | POA: Diagnosis not present

## 2015-06-08 DIAGNOSIS — R918 Other nonspecific abnormal finding of lung field: Secondary | ICD-10-CM | POA: Diagnosis not present

## 2015-06-08 NOTE — Progress Notes (Signed)
Subjective:    Patient ID: OLE LAFON, male    DOB: [birth year], 75 y.o.   MRN: [id number]  HPI 11/15/10- 53 yo M smoker followed for asthma/ COPD, rhinitis, hx urticaria, complicated by HBP,  Last here January 01, 2010 - note reviewed Since here had colonoscopy, rectal polyp and cataract surgeries- with no respiratory problems.  Acute visit. He put out a lot of dusty pine needles 1 week ago. Was coughing a little at routine PhYsEx but has gotten steadily tighter. Cough productive light yellow. Denies chest pain, blood, fever, purulent.  Admits bothersome nasal pressure and postnasal drip.  Admits only rare cigarette now.   12/10/11- 21 yo M smoker followed for asthma/ COPD, rhinitis, hx urticaria, complicated by HBP, 1 year follow-up. Patient states better since last visit. c/o sob occassionally, little wheezing and coughing.  Denies chest pain and chest tightness.  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 Had pneumonia vaccine at his primary physician office. COPD assessment test (CAT) score 17/40  12/16/12- 21 yo M  Former smoker followed for asthma/ COPD, rhinitis, hx urticaria, complicated by HBP FOLLOWS FOR: SOB at times still-worse with activity.  "not bad". No change since last visit. Short of breath especially with lifting or using his arms, mowing the lawn etc. More cough in the last few weeks with "cloudy" sputum but no fever, blood, chest pain. CXR 12/10/12 IMPRESSION:  Minimal bibasilar atelectasis and/or scarring.  Original Report Authenticated By: Luretha Rued, M.D.  02/04/14- 53 yo M  Former smoker followed for asthma/ COPD, rhinitis, hx urticaria, complicated by HBP FOLLOWS FOR: recently sick with virus that was going around-was treated by PCP; otherwise only having SOB at times. Resolved an acute bronchitis  after treatment by his primary physician with prednisone, nebulizer treatment, Z-Pak. Now feels at baseline.discussed prednisone Advair and the relatively long time before effect of a dose is noticed. Had negative workup for a chest pain episode. PFT-02/26/2013-severe obstructive airways disease with response to bronchodilator, air trapping, normal diffusion. FEV1/FVC 0.52, TLC 93%, DLCO 87%. CXR 07/23/13  IMPRESSION:  There is mild hyperinflation of the lungs consistent with COPD.  Minimal fibrotic changes are present and stable. There is no  evidence of pneumonia nor other acute cardiopulmonary disease.  Electronically Signed  By: David Martinique  On: 07/23/2013 13:21FOLLOWS FOR: Pt states his breathing remains at baseline-continues to havve anxiety attacks.  02/06/15- 1 yo M  Former smoker followed for asthma/ COPD, rhinitis, hx urticaria, complicated by HBP Pt c/o worsening SOB, productive cough, wheezing, . Pt states having CT recently of chest at [facility] cough with clear mucus over the last month. Foreign body sensation intermittently upper trachea. We reviewed CT images done by his primary physician. Cardiac workup was negative. Admits occasional heartburn. Rarely uses rescue inhaler. Continues Advair and Spiriva.  CT chest 01/20/15- IMPRESSION: 1. Negative for pneumonia or other acute finding. 2. Mild centrilobular emphysema. 3. Tiny pulmonary nodules, up to 4 mm in the right upper lobe. Given risk factors for bronchogenic carcinoma, follow-up chest CT at 1 year is recommended. This recommendation follows the consensus statement: Guidelines for Management of Small Pulmonary Nodules Detected on CT Scans: A Statement from the [place] as published in Radiology 2005; 237:395-400. Electronically Signed  By: Monte Fantasia M.D.  On: 01/20/2015 08:26  06/08/2015-75 year old male former smoker  followed for asthma/COPD, rhinitis, history urticaria, complicated by  HBP FOLLOWS FOR: Pt states his breathing remains at baseline-continues to havve anxiety attacks. No recent respiratory infection. Exertional dyspnea is stable. He credits an herbal product "Omega XL" for helping his joints and his breathing.  ROS-see HPI Constitutional:   No-   weight loss, night sweats, fevers, chills, +fatigue, lassitude. HEENT:   No-  headaches, difficulty swallowing, tooth/dental problems, sore throat,       No-  sneezing, itching, ear ache, nasal congestion, post nasal drip,  CV:  No-   chest pain, orthopnea, PND, swelling in lower extremities, anasarca, dizziness, palpitations Resp: +  shortness of breath with exertion or at rest.          No- productive cough,  + non-productive cough,  No- coughing up of blood.              No-   change in color of mucus.  No- wheezing.   Skin: No-   rash or lesions. GI:  No-   heartburn, indigestion, abdominal pain, nausea, vomiting,  GU: . MS:  No-   joint pain or swelling.  . Neuro-     nothing unusual Psych:  No- change in mood or affect. + depression or anxiety.  No memory loss.  OBJ- Physical Exam General- Alert, Oriented, Affect-appropriate, Distress- none acute, overweight Skin- rash-none, lesions- none, excoriation- none Lymphadenopathy- none Head- atraumatic            Eyes- Gross vision intact, PERRLA, conjunctivae and secretions clear            Ears- Hearing, canals-normal            Nose- Clear, no-Septal dev, mucus, polyps, erosion, perforation             Throat- Mallampati II , mucosa clear , drainage- none, tonsils- atrophic Neck- flexible , trachea midline, no stridor , thyroid nl, carotid no bruit Chest - symmetrical excursion , unlabored           Heart/CV- RRR , no murmur , no gallop  , no rub, nl s1 s2                           - JVD- none , edema- none, stasis changes- none, varices- none           Lung- clear/distant, wheeze- none, cough- none , dullness-none, rub- none           Chest wall-  Abd-   Br/ Gen/ Rectal- Not done, not indicated Extrem- cyanosis- none, clubbing, none, atrophy- none, strength- nl Neuro- grossly intact to observation Assessment & Plan:

## 2015-06-08 NOTE — Patient Instructions (Signed)
Ok to continue present meds  Please call us if we can help

## 2015-06-11 DIAGNOSIS — R918 Other nonspecific abnormal finding of lung field: Secondary | ICD-10-CM | POA: Insufficient documentation

## 2015-06-11 NOTE — Assessment & Plan Note (Signed)
Remains abstinent 

## 2015-06-11 NOTE — Assessment & Plan Note (Signed)
Anticipate follow-up chest CT 01/2016 for lung nodules

## 2015-06-11 NOTE — Assessment & Plan Note (Signed)
Reasonably good status without recent exacerbation.

## 2015-07-31 ENCOUNTER — Other Ambulatory Visit: Payer: Self-pay | Admitting: Internal Medicine

## 2015-08-04 ENCOUNTER — Other Ambulatory Visit: Payer: Self-pay | Admitting: Internal Medicine

## 2015-09-06 ENCOUNTER — Other Ambulatory Visit: Payer: Self-pay | Admitting: Internal Medicine

## 2015-11-16 ENCOUNTER — Telehealth: Payer: Self-pay | Admitting: Neurology

## 2015-11-16 ENCOUNTER — Ambulatory Visit (INDEPENDENT_AMBULATORY_CARE_PROVIDER_SITE_OTHER): Payer: Medicare Other | Admitting: Neurology

## 2015-11-16 ENCOUNTER — Encounter: Payer: Self-pay | Admitting: Neurology

## 2015-11-16 VITALS — BP 132/62 | HR 69 | Ht 66.5 in | Wt 202.0 lb

## 2015-11-16 DIAGNOSIS — G250 Essential tremor: Secondary | ICD-10-CM | POA: Diagnosis not present

## 2015-11-16 DIAGNOSIS — R251 Tremor, unspecified: Secondary | ICD-10-CM

## 2015-11-16 NOTE — Telephone Encounter (Signed)
-----   Message from [place], DO sent at 11/16/2015  3:11 PM EDT ----- Luvenia Starch, let pt know that we did get PCP labs and I do recommend he have TSH.  Can have done here if he would like

## 2015-11-16 NOTE — Progress Notes (Signed)
Subjective:   Glen [name] was seen in consultation in the movement disorder clinic at the request of [name], MD.  The evaluation is for tremor.  The records that were made available to me were reviewed.  Pt reports that he has had tremor since he was a child.  States that he remembers it with stress.  It has gotten a little worse with time.  Both hands are the same.  He is L hand dominant.  Mom and tremor and so did dad but "his was alcohol induced."   Affected by caffeine:  Doesn't drink enough to know Affected by alcohol:  No. (drinks wine and crown with dinner) Affected by stress:  Yes.   (states that he had had a "melt down" on memorial day weekend and started seeing psychology - been on zoloft and xanax for years - never takes more than 1 xanax a day) Affected by fatigue:  Yes.   Spills soup if on spoon:  Yes.   Spills glass of liquid if full:  No. Affects ADL's (tying shoes, brushing teeth, etc):  No.  Current/Previously tried tremor medications: xanax  Current medications that may exacerbate tremor:  Spiriva/ventolin/advair  Outside reports reviewed: lab reports and office notes.  Allergies  Allergen Reactions  . Aspirin     REACTION: pt has bleeding ulcers    Outpatient Encounter Prescriptions as of 11/16/2015  Medication Sig  . acetaminophen (TYLENOL) 325 MG tablet Take 650 mg by mouth every 6 (six) hours as needed for mild pain.  [name] ADVAIR DISKUS 250-50 MCG/DOSE AEPB INHALE 1 PUFF INTO THE LUNGS TWICE DAILY. RINSE MOUTH AFTER USE  . ALPRAZolam (XANAX) 0.25 MG tablet Take 0.25 mg by mouth daily as needed for anxiety.   [name] amLODipine (NORVASC) 10 MG tablet Take 1 tablet by mouth Daily.  [name] atorvastatin (LIPITOR) 40 MG tablet Take 40 mg by mouth daily.  . hydrochlorothiazide (MICROZIDE) 12.5 MG capsule Take 12.5 mg by mouth daily.   [name] HYDROcodone-acetaminophen (NORCO/VICODIN) 5-325 MG tablet TK 1 T PO TID PRN  . Omega-3 Fatty Acids (OMEGA 3 PO) Take by mouth. Omega  XL  . omeprazole (PRILOSEC) 20 MG capsule Take 20 mg by mouth daily.    . sertraline (ZOLOFT) 50 MG tablet Take 100 mg by mouth daily.   [name] SPIRIVA HANDIHALER 18 MCG inhalation capsule INHALE THE CONTENTS OF 1 CAPSULE VIA INHALATION DEVICE EVERY DAY  . valsartan (DIOVAN) 320 MG tablet Take 320 mg by mouth daily.  . VENTOLIN HFA 108 (90 Base) MCG/ACT inhaler INHALE 2 PUFFS BY MOUTH EVERY 4 HOURS AS NEEDED FOR WHEEZING OR SHORTNESS OF BREATH  . nitroGLYCERIN (NITROSTAT) 0.4 MG SL tablet Place 0.4 mg under the tongue every 5 (five) minutes as needed for chest pain. Reported on 11/16/2015   No facility-administered encounter medications on file as of 11/16/2015.    Past Medical History  Diagnosis Date  . Allergic rhinitis, cause unspecified   . Asthma   . Hypertension   . PUD (peptic ulcer disease)     avoids aspirin  . Depression   . Anxiety     Past Surgical History  Procedure Laterality Date  . Nasal septum surgery  1960's  . Cataract extraction, bilateral    . Colonoscopy      Social History   Social History  . Marital Status: Married    Spouse Name: N/A  . Number of Children: 0  . Years of Education: N/A   Occupational History  .  retired Psychiatric nurse    Social History Main Topics  . Smoking status: Former Smoker -- 0.50 packs/day for 16 years    Types: Cigarettes    Quit date: 04/12/2011  . Smokeless tobacco: Never Used  . Alcohol Use: 0.0 oz/week    0 Standard drinks or equivalent per week     Comment: 1 liquor drink, 1 glass wine daily  . Drug Use: No  . Sexual Activity: Not on file   Other Topics Concern  . Not on file   Social History Narrative    Family Status  Relation Status Death Age  . Father Deceased     heart disease, suicide, EtOHic  . Mother Deceased     breast cancer  . Sister Alive     brain tumor, anxiety, depression    Review of Systems Chronic SOB with COPD.  Otherwise, Complete 10 system ROS was obtained and was negative  apart from what is mentioned.   Objective:   VITALS:   Filed Vitals:   11/16/15 1403  BP: 132/62  Pulse: 69  Height: 5' 6.5" (1.689 m)  Weight: 202 lb (91.627 kg)   Gen:  Appears stated age and in NAD. HEENT:  Normocephalic, atraumatic. The mucous membranes are moist. The superficial temporal arteries are without ropiness or tenderness. Cardiovascular: Regular rate and rhythm. Lungs: Clear to auscultation bilaterally. Neck: There are no carotid bruits noted bilaterally.  NEUROLOGICAL:  Orientation:  The patient is alert and oriented x 3.  Recent and remote memory are intact.  Attention span and concentration are normal.  Able to name objects and repeat without trouble.  Fund of knowledge is appropriate Cranial nerves: There is good facial symmetry. The pupils are equal round and reactive to light bilaterally. Fundoscopic exam reveals clear disc margins bilaterally. Extraocular muscles are intact and visual fields are full to confrontational testing. Speech is fluent and clear. Soft palate rises symmetrically and there is no tongue deviation. Hearing is intact to conversational tone. Tone: Tone is good throughout. Sensation: Sensation is intact to light touch and pinprick throughout (facial, trunk, extremities). Vibration is intact at the bilateral big toe. There is no extinction with double simultaneous stimulation. There is no sensory dermatomal level identified. Coordination:  The patient has no dysdiadichokinesia or dysmetria. Motor: Strength is 5/5 in the bilateral upper and lower extremities.  Shoulder shrug is equal bilaterally.  There is no pronator drift.  There are no fasciculations noted. DTR's: Deep tendon reflexes are 2+-3/4 at the bilateral biceps, triceps, brachioradialis, patella and 1/4 at the bilateral achilles.  Plantar responses are downgoing bilaterally. Gait and Station: The patient is able to ambulate without difficulty.   MOVEMENT EXAM: Tremor:  There is tremor in  the UE, noted most significantly with action and particularly when the arm is held distal and not proximal.  There is intention tremor.  The patient is not able to draw Archimedes spirals without significant difficulty.  There is tremor at rest bilaterally but it is minor.  The patient is able to pour water from one glass to another without spilling it.  Labs:    Chemistry      Component Value Date/Time   NA 139 07/23/2013 1237   K 4.1 07/23/2013 1237   CL 100 07/23/2013 1237   CO2 23 07/23/2013 1237   BUN 21 07/23/2013 1237   CREATININE 0.86 07/23/2013 1237      Component Value Date/Time   CALCIUM 9.0 07/23/2013 1237   ALKPHOS 64  07/23/2013 1237   AST 19 07/23/2013 1237   ALT 19 07/23/2013 1237   BILITOT 0.2* 07/23/2013 1237     No results found for: TSH   I was unable to obtain lab work from his primary care physician.  His hemoglobin A1c on 02/26/2016 was 5.7.   Assessment/Plan:   1.  Essential Tremor.  -This is evidenced by the symmetrical nature and longstanding hx of gradually getting worse.  We discussed nature and pathophysiology.  We discussed that this can continue to gradually get worse with time.  We discussed that some medications can worsen this, as can caffeine use. His inhalers can worsen this and we discussed this.  In addition, he suffers from anxiety and this can worsen tremor as well.  We discussed medication therapy as well as surgical therapy.  Ultimately, the patient decided to hold off on any medication.  I did call his primary care physician during the office visit, but was not able to obtain any lab work until after he left.  It does not appear that his TSH has been checked, and I will call him back until him that I would recommend this be done.  Otherwise, I will follow-up with him on an as-needed basis.  Greater than 50% of the 45 minute visit was in counseling.

## 2015-11-17 NOTE — Telephone Encounter (Signed)
Patient made aware needs TSH level drawn. He will come to our office to check into lab before going to [address] to have drawn. Order entered.

## 2015-11-17 NOTE — Addendum Note (Signed)
Addended byMargarette Asal L on: 11/17/2015 10:20 AM   Modules accepted: Orders

## 2015-11-17 NOTE — Telephone Encounter (Signed)
LMOM for patient to call me.

## 2015-11-20 ENCOUNTER — Other Ambulatory Visit (INDEPENDENT_AMBULATORY_CARE_PROVIDER_SITE_OTHER): Payer: Medicare Other

## 2015-11-20 DIAGNOSIS — R251 Tremor, unspecified: Secondary | ICD-10-CM | POA: Diagnosis not present

## 2015-11-20 LAB — TSH: TSH: 1.48 u[IU]/mL (ref 0.35–4.50)

## 2015-11-28 ENCOUNTER — Telehealth: Payer: Self-pay | Admitting: Internal Medicine

## 2015-11-28 MED ORDER — ALBUTEROL SULFATE HFA 108 (90 BASE) MCG/ACT IN AERS: 2.0000 | INHALATION_SPRAY | RESPIRATORY_TRACT | Status: DC | PRN

## 2015-11-28 NOTE — Telephone Encounter (Signed)
Spoke with pt. States that his insurance will no longer cover Ventolin. Covered alternative is [employer]. This prescription has been sent in. Nothing further was needed.

## 2015-12-10 ENCOUNTER — Other Ambulatory Visit: Payer: Self-pay | Admitting: Internal Medicine

## 2015-12-14 NOTE — Telephone Encounter (Signed)
Patient called back about labs. Made aware TSH was normal.

## 2015-12-22 ENCOUNTER — Ambulatory Visit
Admission: RE | Admit: 2015-12-22 | Discharge: 2015-12-22 | Disposition: A | Discharge status: Home / Self Care | Payer: Medicare Other | Source: Ambulatory Visit | Attending: Family Medicine | Admitting: Family Medicine

## 2015-12-22 DIAGNOSIS — F17211 Nicotine dependence, cigarettes, in remission: Secondary | ICD-10-CM

## 2015-12-22 IMAGING — CT CT CHEST W/O CM
3 of 4 series · 16 of 30 positions shown, 18 images · non-contrast
Comparison: [DATE] chest CT.

CLINICAL DATA: 75-year-old male presents for follow-up of pulmonary
nodules. Former smoker.

EXAM:
CT CHEST WITHOUT CONTRAST
TECHNIQUE: Multidetector CT imaging of the chest was performed following the
standard protocol without IV contrast.

[Series 3: chest w/o · axial · non-contrast · 0.77mm/px · z∈[-239,-9]mm · 6 of 130 slices shown]
[im 19/130  lung]
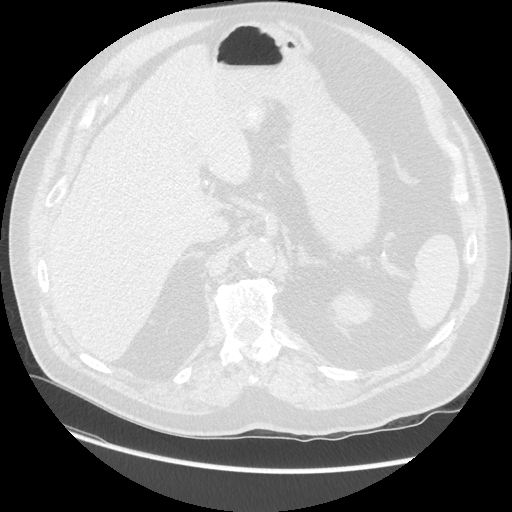
[im 37/130  lung]
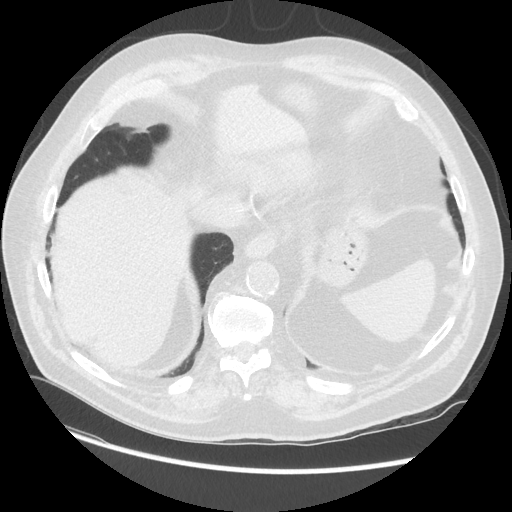
[im 56/130  lung]
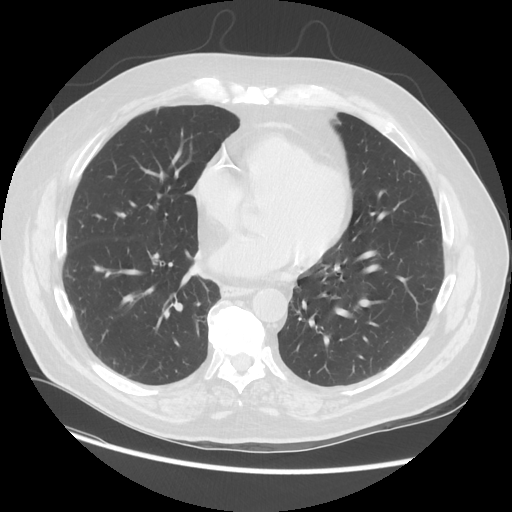
[im 74/130  lung]
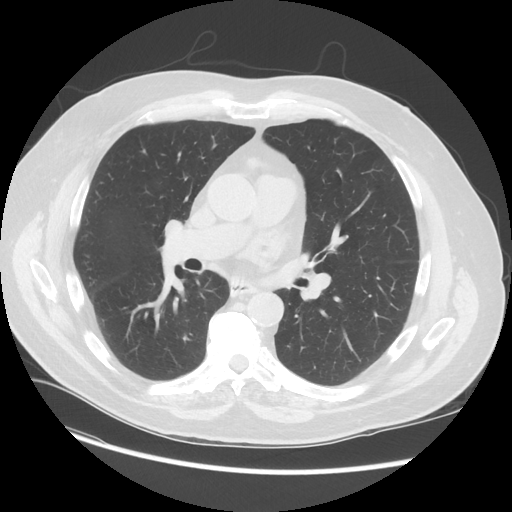
[im 93/130  lung]
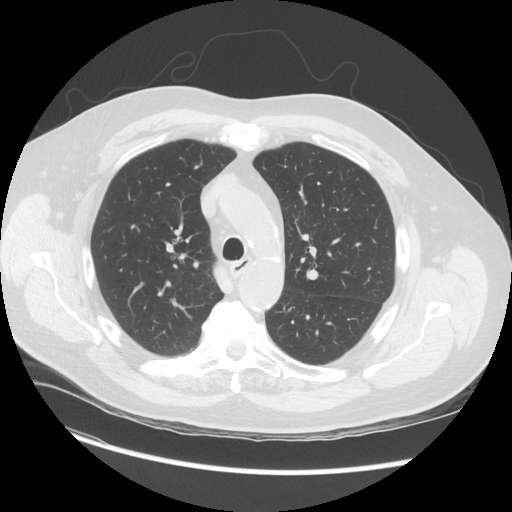
[im 111/130  lung]
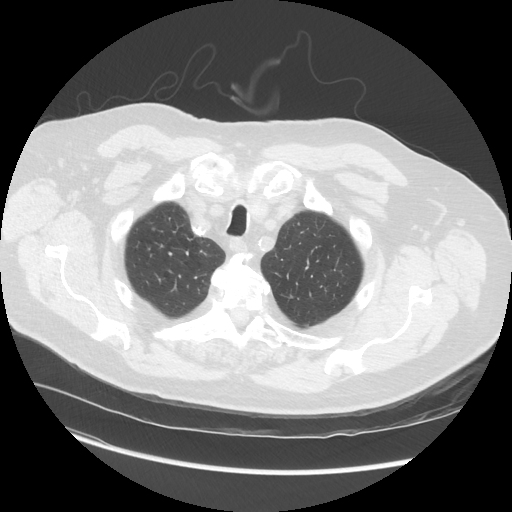

[Series 4: lung windows · axial · 0.77mm/px · z∈[-244,-4]mm · 7 of 130 slices shown, 9 images]
[im 17/130  mediastinal]
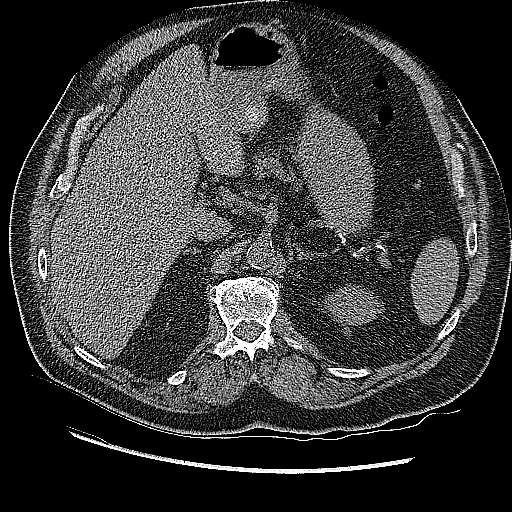
[im 17/130  lung]
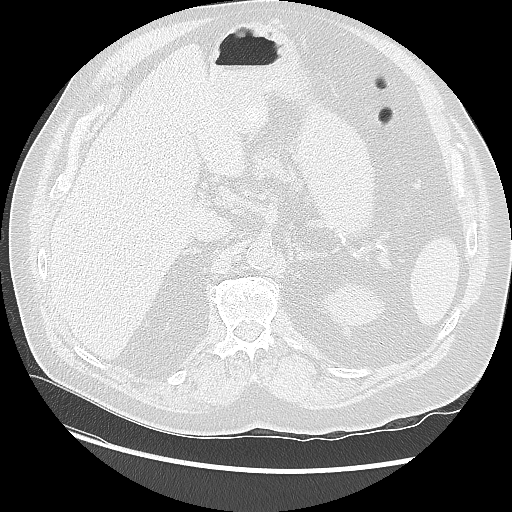
[im 33/130  lung]
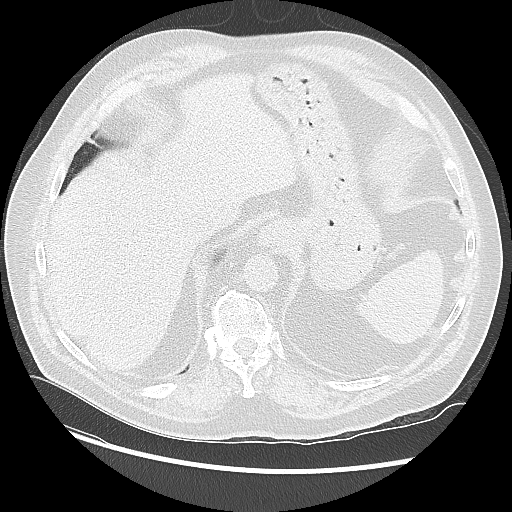
[im 49/130  lung]
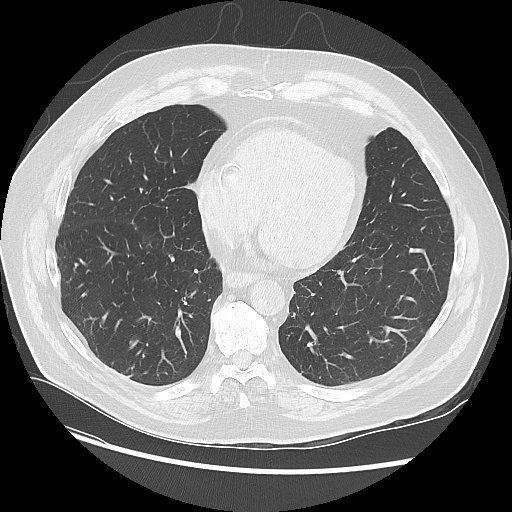
[im 65/130  lung]
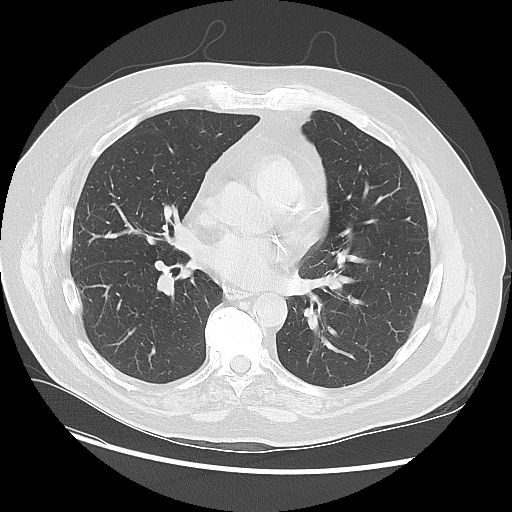
[im 81/130  mediastinal]
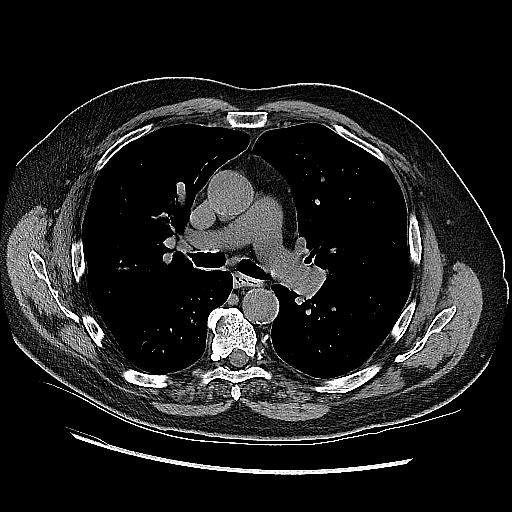
[im 81/130  lung]
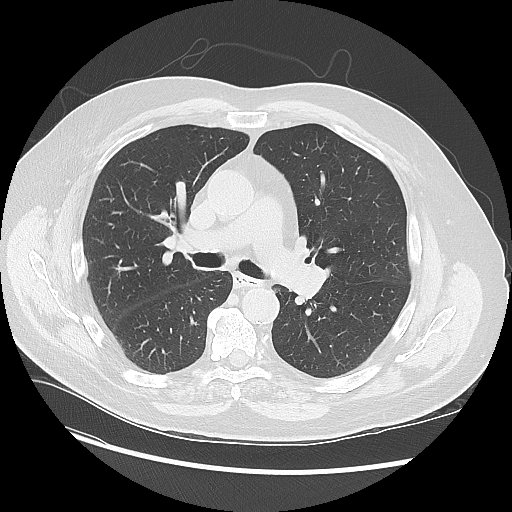
[im 97/130  lung]
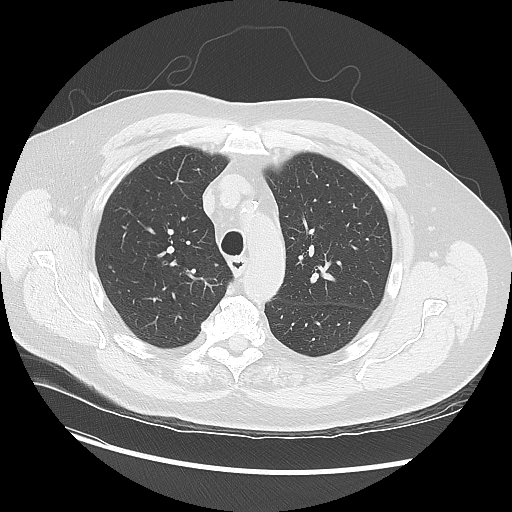
[im 113/130  lung]
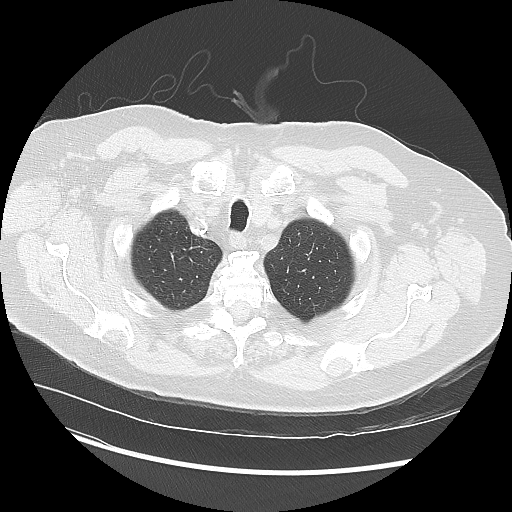

[Series 602: sagittal body · sagittal · 0.77mm/px · 3 of 159 slices shown]
[im 16/159  mediastinal]
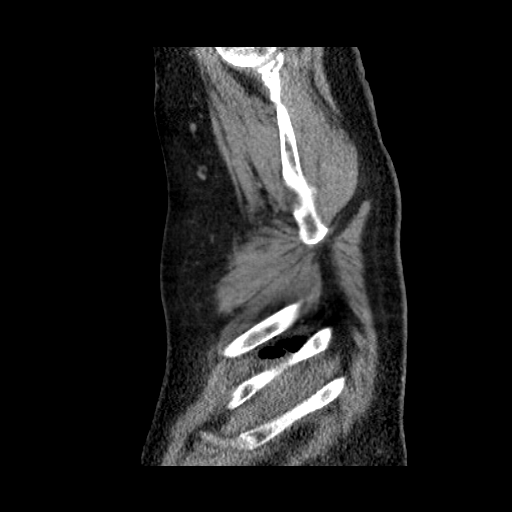
[im 32/159  mediastinal]
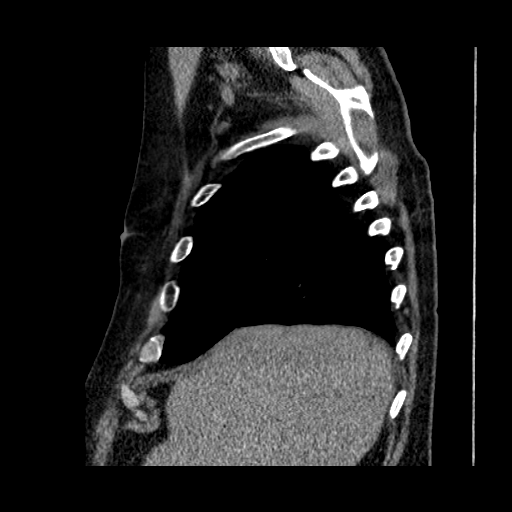
[im 48/159  mediastinal]
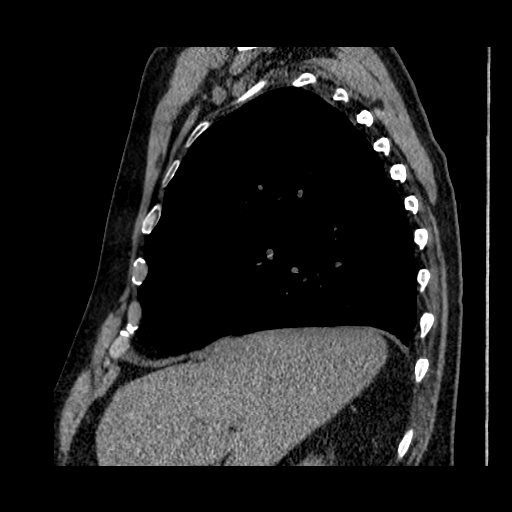

[16 of 30 positions shown; findings below may reference images not displayed]

FINDINGS: Mediastinum/Nodes: Normal heart size. No significant pericardial
fluid/thickening. Left main, left anterior descending, left
circumflex and right coronary atherosclerosis. Atherosclerotic
nonaneurysmal thoracic aorta. Normal caliber pulmonary arteries. No
discrete thyroid nodules. Unremarkable esophagus. No pathologically
enlarged axillary, mediastinal or gross hilar lymph nodes, noting
limited sensitivity for the detection of hilar adenopathy on this
noncontrast study.

Lungs/Pleura: No pneumothorax. No pleural effusion. Mild
centrilobular emphysema. There is a new 1.2 x 1.1 cm ground-glass
posterior right upper lobe pulmonary nodule (series 4/ image 43).
Additional tiny scattered pulmonary nodules in both lungs measuring
up to 4 mm in the apical right upper lobe (series 4/ image 21),
unchanged. No acute consolidative airspace disease.

Upper abdomen: Diffuse hepatic steatosis.

Musculoskeletal: No aggressive appearing focal osseous lesions.
Moderate thoracic spondylosis.
IMPRESSION: 1. New 1.2 cm right upper lobe ground-glass pulmonary nodule.
Initial follow-up with CT at 6-12 months is recommended to confirm
persistence. If persistent, repeat CT is recommended every 2 years
until 5 years of stability has been established. This recommendation
follows the consensus statement: Guidelines for Management of
Incidental Pulmonary Nodules Detected on CT Images:From the
[HOSPITAL] [[place]]; published online before print
(10.1148/radiol.[PHONE_NUMBER]).
2. Previously described tiny pulmonary nodules measuring up to 4 mm
are stable and probably benign.
3. Mild centrilobular emphysema.
4. Aortic atherosclerosis. Left main and 3 vessel coronary
atherosclerosis.
5. Diffuse hepatic steatosis.

## 2016-02-10 ENCOUNTER — Other Ambulatory Visit: Payer: Self-pay | Admitting: Internal Medicine

## 2016-02-12 ENCOUNTER — Telehealth: Payer: Self-pay | Admitting: Internal Medicine

## 2016-02-12 MED ORDER — FLUTICASONE-SALMETEROL 250-50 MCG/DOSE IN AEPB: INHALATION_SPRAY | RESPIRATORY_TRACT | 1 refills | Status: DC

## 2016-02-12 NOTE — Telephone Encounter (Signed)
Refill has been sent to the pharmacy.  

## 2016-03-18 ENCOUNTER — Other Ambulatory Visit: Payer: Self-pay | Admitting: Internal Medicine

## 2016-03-20 NOTE — Telephone Encounter (Signed)
[facility] pharmacy [phone]) request refill on Spiriva for pt.Glen [name]

## 2016-04-12 ENCOUNTER — Other Ambulatory Visit: Payer: Self-pay | Admitting: Internal Medicine

## 2016-04-15 ENCOUNTER — Other Ambulatory Visit: Payer: Self-pay | Admitting: Internal Medicine

## 2016-04-16 MED ORDER — FLUTICASONE-SALMETEROL 250-50 MCG/DOSE IN AEPB: INHALATION_SPRAY | RESPIRATORY_TRACT | 3 refills | Status: DC

## 2016-05-14 ENCOUNTER — Other Ambulatory Visit: Payer: Self-pay | Admitting: Internal Medicine

## 2016-05-21 ENCOUNTER — Other Ambulatory Visit: Payer: Self-pay | Admitting: Family Medicine

## 2016-05-21 DIAGNOSIS — R911 Solitary pulmonary nodule: Secondary | ICD-10-CM

## 2016-06-07 ENCOUNTER — Ambulatory Visit (INDEPENDENT_AMBULATORY_CARE_PROVIDER_SITE_OTHER): Payer: Medicare Other | Admitting: Internal Medicine

## 2016-06-07 ENCOUNTER — Encounter: Payer: Self-pay | Admitting: Internal Medicine

## 2016-06-07 VITALS — BP 116/74 | HR 95 | Ht 66.0 in | Wt 209.2 lb

## 2016-06-07 DIAGNOSIS — J441 Chronic obstructive pulmonary disease with (acute) exacerbation: Secondary | ICD-10-CM

## 2016-06-07 DIAGNOSIS — R918 Other nonspecific abnormal finding of lung field: Secondary | ICD-10-CM

## 2016-06-07 DIAGNOSIS — J449 Chronic obstructive pulmonary disease, unspecified: Secondary | ICD-10-CM

## 2016-06-07 MED ORDER — METHYLPREDNISOLONE ACETATE 80 MG/ML IJ SUSP
80.0000 mg | Freq: Once | INTRAMUSCULAR | Status: AC
  Administered 2016-06-07: 80 mg via INTRAMUSCULAR

## 2016-06-07 MED ORDER — FLUTICASONE-UMECLIDIN-VILANT 100-62.5-25 MCG/INH IN AEPB: 1.0000 | INHALATION_SPRAY | Freq: Every day | RESPIRATORY_TRACT | 0 refills | Status: DC

## 2016-06-07 MED ORDER — LEVALBUTEROL HCL 0.63 MG/3ML IN NEBU
0.6300 mg | INHALATION_SOLUTION | Freq: Once | RESPIRATORY_TRACT | Status: AC
Start: 2016-06-07 — End: 2016-06-07
  Administered 2016-06-07: 0.63 mg via RESPIRATORY_TRACT

## 2016-06-07 NOTE — Progress Notes (Signed)
Subjective:    Patient ID: Glen [name], male    DOB: Oct 20, 1940, 76 y.o.   MRN: [id number]  HPI male former smoker followed for COPD, rhinitis, history urticaria, complicated by HBP [id number] obstructive airways disease with response to bronchodilator, air trapping, normal diffusion. FEV1/FVC 0.52, TLC 93%, DLCO 87 Walk Test 06/07/2016-Baseline 94% desaturated only to 92% on room air CT chest 12/22/2015-1.2 cm right upper lobe groundglass pulmonary nodule ------------------------------------------------------------------  06/07/2016-76 year old male former smoker followed for COPD, lung nodule,  rhinitis, history urticaria, complicated by HBP FOLLOWS FOR: Pt states he continues to have increased chest congestion with cough-was given Zpak by PCP 2 weeks ago. Dr Dorthy Cooler is following a RUL nodule With next CT scheduled in February. Onset of acute bronchitic exacerbation 3 or 4 weeks ago. Had had flu shot. Still some persistent chest congestion. He complains of dyspnea on exertion especially with steps and stairs, noted probably over the last year. Denies chest pain or palpitations. Daily cough with scant clear phlegm or occasional slight green but no blood or fever. Patient seen in the office today and instructed on use of Trelegy.  Patient expressed understanding and demonstrated technique. [name]  Walk Test 06/07/2016-Baseline 94% desaturated only to 92% on room air CT chest 12/22/2015- f/u sched 06/25/16 IMPRESSION: 1. New 1.2 cm right upper lobe ground-glass pulmonary nodule. Initial follow-up with CT at 6-12 months is recommended to confirm persistence. If persistent, repeat CT is recommended every 2 years until 5 years of stability has been established. This recommendation follows the consensus statement: Guidelines for Management of Incidental Pulmonary Nodules Detected on CT Images:From the Fleischner Society 2017; published online before  print (10.1148/[id number]). 2. Previously described tiny pulmonary nodules measuring up to 4 mm are stable and probably benign. 3. Mild centrilobular emphysema. 4. Aortic atherosclerosis. Left main and 3 vessel coronary atherosclerosis. 5. Diffuse hepatic steatosis.  ROS-see HPI Constitutional:   No-   weight loss, night sweats, fevers, chills, +fatigue, lassitude. HEENT:   No-  headaches, difficulty swallowing, tooth/dental problems, sore throat,       No-  sneezing, itching, ear ache, nasal congestion, post nasal drip,  CV:  No-   chest pain, orthopnea, PND, swelling in lower extremities, anasarca, dizziness, palpitations Resp: +  shortness of breath with exertion or at rest.         + productive cough,  + non-productive cough,  No- coughing up of blood.              No-   change in color of mucus.  No- wheezing.   Skin: No-   rash or lesions. GI:  No-   heartburn, indigestion, abdominal pain, nausea, vomiting,  GU: . MS:  No-   joint pain or swelling.  . Neuro-     nothing unusual Psych:  No- change in mood or affect. + depression or anxiety.  No memory loss.  OBJ- Physical Exam General- Alert, Oriented, Affect-appropriate, Distress- none acute, + obese Skin- rash-none, lesions- none, excoriation- none Lymphadenopathy- none Head- atraumatic            Eyes- Gross vision intact, PERRLA, conjunctivae and secretions clear            Ears- Hearing, canals-normal            Nose- Clear, no-Septal dev, mucus, polyps, erosion, perforation             Throat- Mallampati II , mucosa clear , drainage- none, tonsils- atrophic Neck- flexible , trachea  midline, no stridor , thyroid nl, carotid no bruit Chest - symmetrical excursion , unlabored           Heart/CV- RRR , no murmur , no gallop  , no rub, nl s1 s2                           - JVD- none , edema- none, stasis changes- none, varices- none           Lung- clear/distant/unlabored at rest, wheeze- none, cough- none ,  dullness-none, rub- none           Chest wall-  Abd-  Br/ Gen/ Rectal- Not done, not indicated Extrem- cyanosis- none, clubbing, none, atrophy- none, strength- nl Neuro- grossly intact to observation Assessment & Plan:

## 2016-06-07 NOTE — Assessment & Plan Note (Signed)
Recent exacerbation consistent with slowly resolving viral bronchitis. Despite complaint of dyspnea on exertion he did not desaturate on walk test today. Cardiac component is not excluded but deconditioning and significant abdominal obesity, together with his severe COPD, would be sufficient to explain his symptoms. Plan-nebulizer treatments Xopenex today, Depo-Medrol, try sample Trelegy Ellipta inhaler for comparison. Schedule overnight oximetry on room air.

## 2016-06-07 NOTE — Assessment & Plan Note (Signed)
Previously followed nodules in the right upper lobe have been stable and are likely benign. Nonspecific new right upper lobe nodule does need to be watched. Next CT scan has already been scheduled for next month.Dr Dorthy Cooler is managing this.

## 2016-06-07 NOTE — Patient Instructions (Signed)
Neb xop 0.63   Dx exacerbation COPD  Depo medrol  Sample Trelegy Ellipta inhaler     Inhale 1 puff, one time daily       Try this instead of Advair and Spiriva. Go back to those for comparison when the sample runs out.  Walk test on room air    Dx  COPD mixed type  ONOX on room air       If you can lose even a few pounds, I believe it will help your shortness of breath.

## 2016-06-25 ENCOUNTER — Ambulatory Visit
Admission: RE | Admit: 2016-06-25 | Discharge: 2016-06-25 | Disposition: A | Discharge status: Home / Self Care | Payer: Medicare Other | Source: Ambulatory Visit | Attending: Family Medicine | Admitting: Family Medicine

## 2016-06-25 DIAGNOSIS — R911 Solitary pulmonary nodule: Secondary | ICD-10-CM

## 2016-06-25 IMAGING — CT CT CHEST W/O CM
3 of 4 series · 17 of 30 positions shown, 19 images · non-contrast
Comparison: [DATE]

CLINICAL DATA: Follow-up lung nodule

EXAM:
CT CHEST WITHOUT CONTRAST
TECHNIQUE: Multidetector CT imaging of the chest was performed following the
standard protocol without IV contrast.

[Series 3: chest w/o · axial · non-contrast · 0.87mm/px · z∈[-270,-28]mm · 7 of 131 slices shown, 9 images]
[im 17/131  mediastinal]
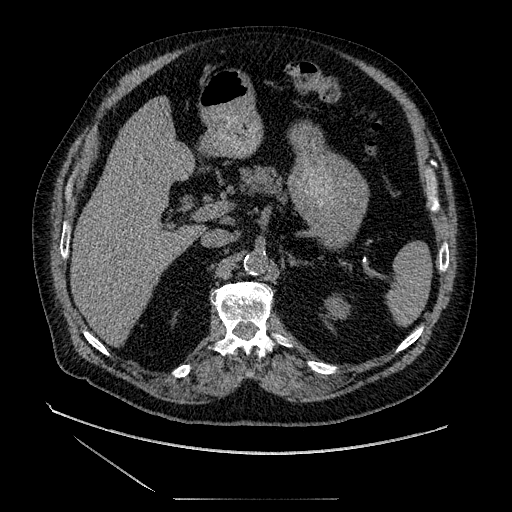
[im 17/131  lung]
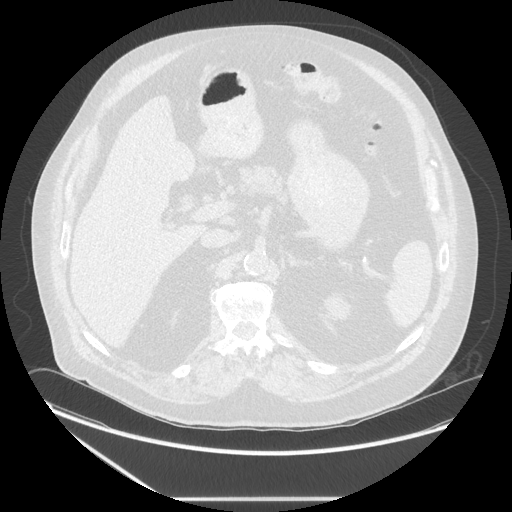
[im 33/131  lung]
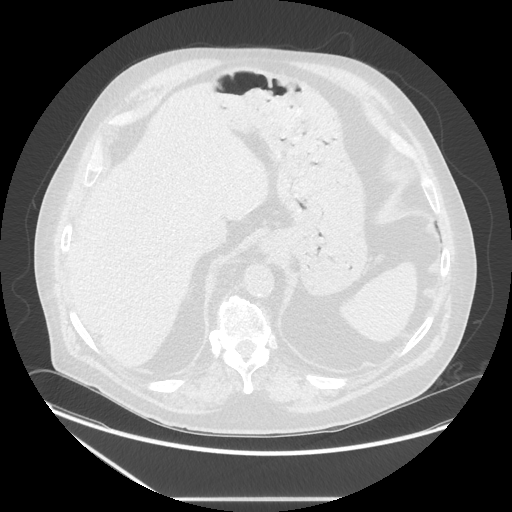
[im 49/131  lung]
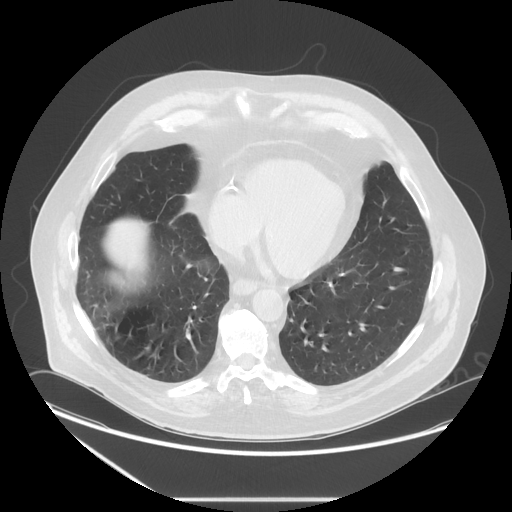
[im 66/131  lung]
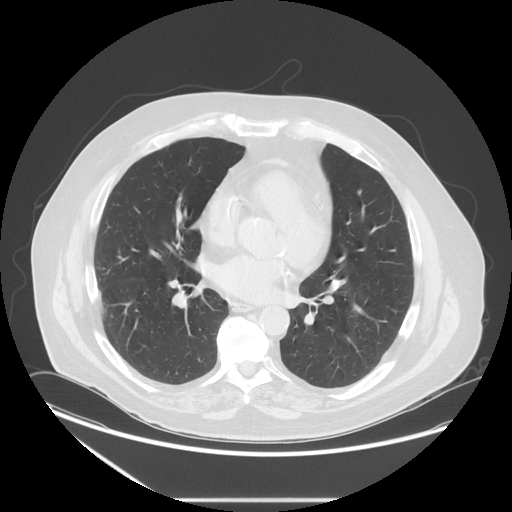
[im 82/131  mediastinal]
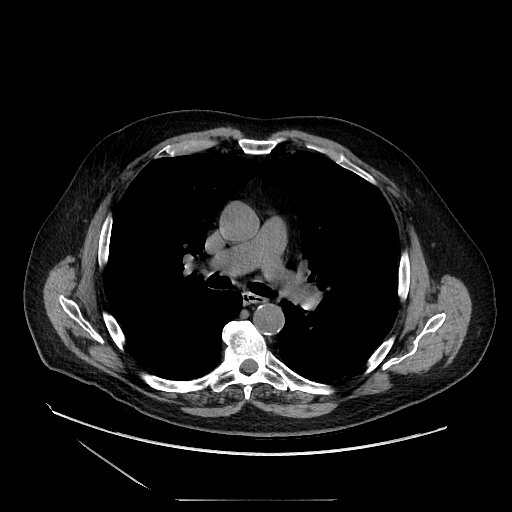
[im 82/131  lung]
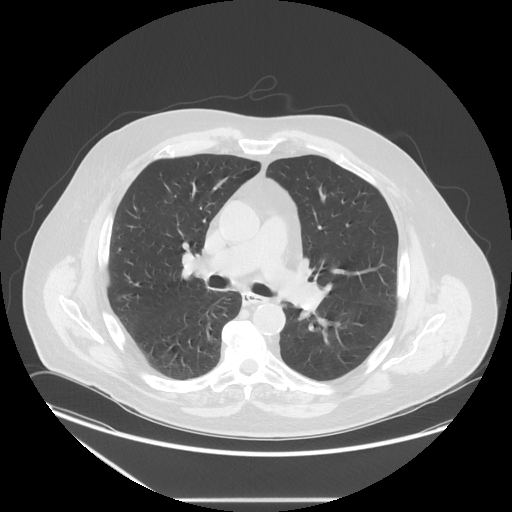
[im 98/131  lung]
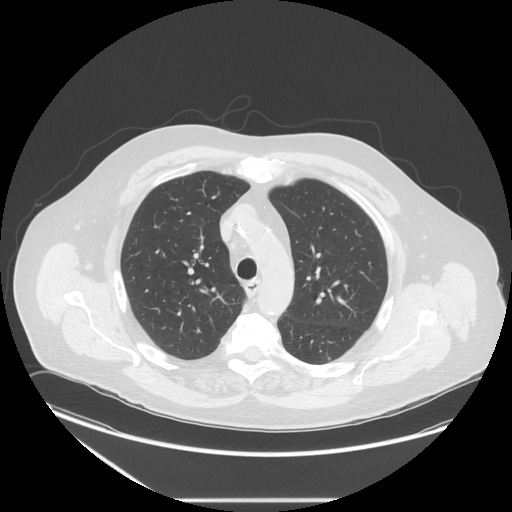
[im 114/131  lung]
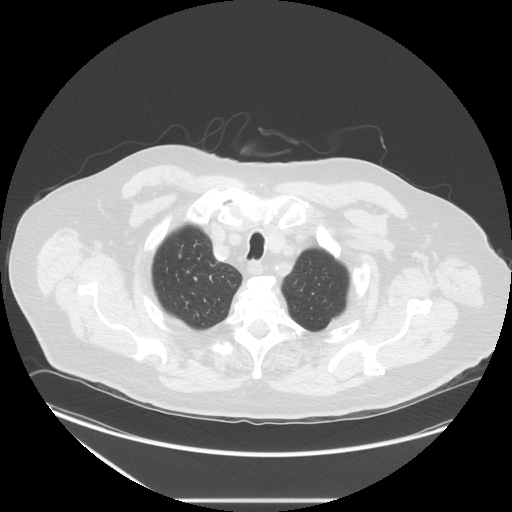

[Series 4: lung windows · axial · 0.87mm/px · z∈[-266,-33]mm · 6 of 131 slices shown]
[im 19/131  lung]
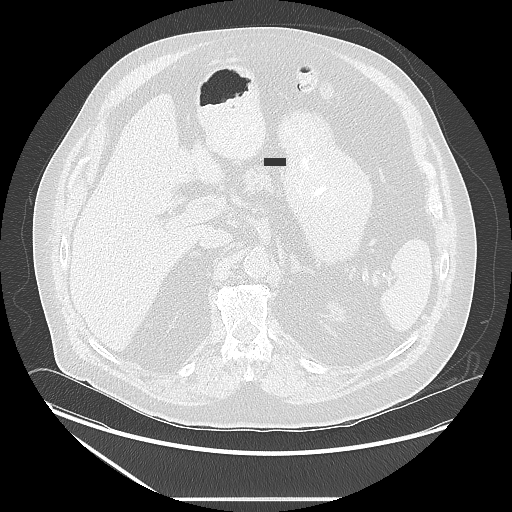
[im 38/131  lung]
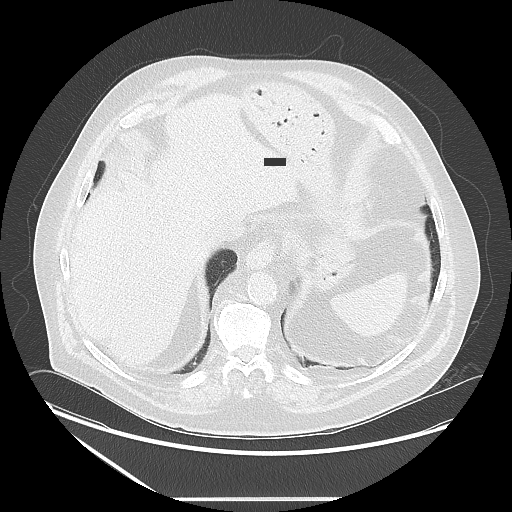
[im 56/131  lung]
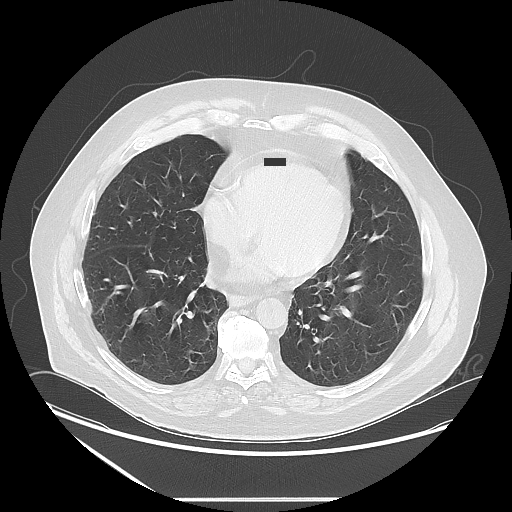
[im 75/131  lung]
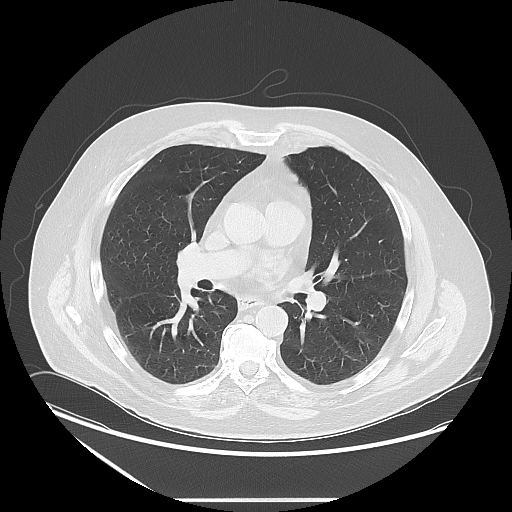
[im 93/131  lung]
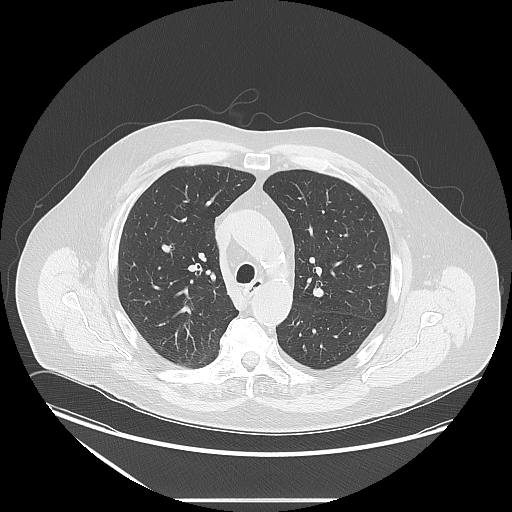
[im 112/131  lung]
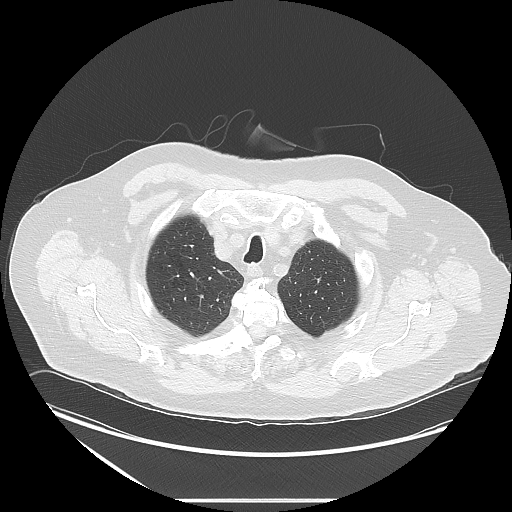

[Series 602: sagittal body · sagittal · 0.87mm/px · 4 of 178 slices shown]
[im 18/178  mediastinal]
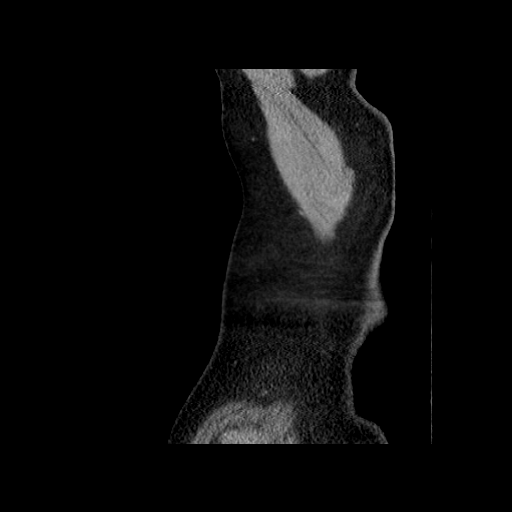
[im 36/178  mediastinal]
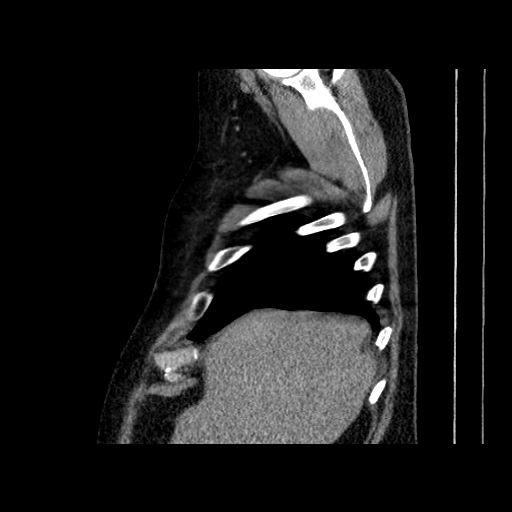
[im 54/178  mediastinal]
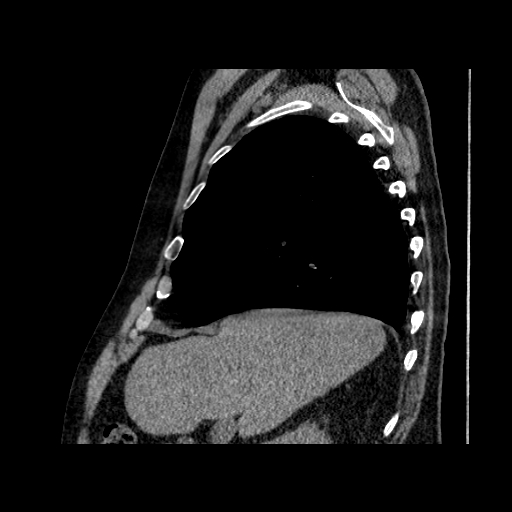
[im 71/178  mediastinal]
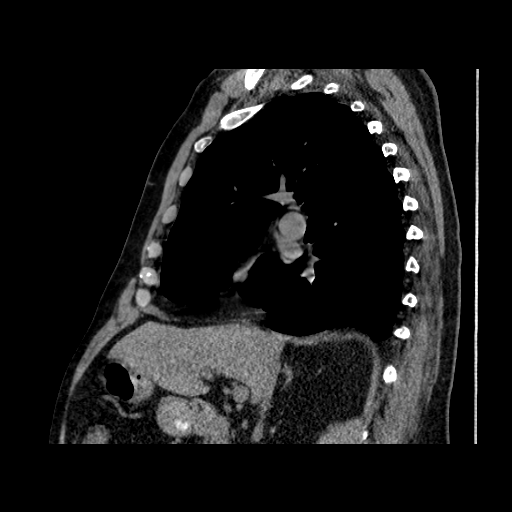

[17 of 30 positions shown; findings below may reference images not displayed]

FINDINGS: Cardiovascular: Somewhat limited due the lack of IV contrast.
Diffuse calcifications of the thoracic aorta are noted. No
aneurysmal dilatation is seen. No significant cardiac enlargement is
noted. Mild coronary calcifications are noted.

Mediastinum/Nodes: The thoracic inlet is within normal limits. No
significant hilar or mediastinal adenopathy is noted. The esophagus
as visualized is within normal limits.

Lungs/Pleura: The lungs are well aerated bilaterally without focal
confluent infiltrate or sizable effusion. The tiny nodule seen in
the right upper lobe is again identified on image number 18 of
series 4 and stable. The previously noted ground-glass nodule in the
right upper lobe posteriorly has resolved consistent with
postinflammatory change. Minimal changes are noted in the left lung
base posteriorly consistent with atelectatic changes. No focal
nodule is noted. Mild emphysematous changes are noted.

Upper Abdomen: No acute abnormality.

Musculoskeletal: Degenerative changes of the thoracic spine are
noted. No acute abnormality is seen.
IMPRESSION: Resolution of previously seen right upper lobe ground-glass nodule.

Stable tiny right upper lobe nodule. No further follow-up is
recommended.

COPD

Mild atelectasis in the left lung base

## 2016-07-03 ENCOUNTER — Encounter: Payer: Self-pay | Admitting: Internal Medicine

## 2016-09-26 ENCOUNTER — Telehealth: Payer: Self-pay | Admitting: Internal Medicine

## 2016-09-26 MED ORDER — AMOXICILLIN-POT CLAVULANATE 875-125 MG PO TABS: 1.0000 | ORAL_TABLET | Freq: Two times a day (BID) | ORAL | 0 refills | Status: DC

## 2016-09-26 MED ORDER — BENZONATATE 200 MG PO CAPS: 200.0000 mg | ORAL_CAPSULE | Freq: Three times a day (TID) | ORAL | 0 refills | Status: DC | PRN

## 2016-09-26 MED ORDER — PREDNISONE 20 MG PO TABS: 20.0000 mg | ORAL_TABLET | Freq: Every day | ORAL | 0 refills | Status: DC

## 2016-09-26 NOTE — Telephone Encounter (Signed)
Offer augmentin 875 mg, # 14, 1 twice daily            Tessalon perles 200 mg, # 30, 1 every 8 hours if needed for cough            Prednisone 20 mg, # 5, 1 daily x 5 days

## 2016-09-26 NOTE — Telephone Encounter (Signed)
Called and spoke to pt. Pt c/o prod cough with green mucus, increase in SOB, chest soreness from coughing, fatigue - pt states these s/s have steadily getting worse for the last 6 weeks. Pt denies f/c/s. Pt states he is using the proair hfa 2-3 times per day with little relief. Pt was requesting an appt but there are no openings, pt now requesting recs by phone.  Dr. Annamaria Boots please advise. Thanks.   Allergies  Allergen Reactions  . Aspirin     REACTION: pt has bleeding ulcers  . Lisinopril     Other reaction(s): cough    Current Outpatient Prescriptions on File Prior to Visit  Medication Sig Dispense Refill  . acetaminophen (TYLENOL) 325 MG tablet Take 650 mg by mouth every 6 (six) hours as needed for mild pain.    [name] albuterol (PROAIR HFA) 108 (90 Base) MCG/ACT inhaler Inhale 2 puffs into the lungs every 4 (four) hours as needed for wheezing or shortness of breath. 1 Inhaler 5  . ALPRAZolam (XANAX) 0.25 MG tablet Take 0.25 mg by mouth daily as needed for anxiety.     [name] amLODipine (NORVASC) 10 MG tablet Take 1 tablet by mouth Daily.    [name] atorvastatin (LIPITOR) 40 MG tablet Take 40 mg by mouth daily.    . Fluticasone-Salmeterol (ADVAIR DISKUS) 250-50 MCG/DOSE AEPB INHALE 1 PUFF INTO THE LUNGS TWICE DAILY. RINSE MOUTH AFTER USE 3 each 3  . hydrochlorothiazide (MICROZIDE) 12.5 MG capsule Take 12.5 mg by mouth daily.     [name] HYDROcodone-acetaminophen (NORCO/VICODIN) 5-325 MG tablet TK 1 T PO TID PRN  0  . Omega-3 Fatty Acids (OMEGA 3 PO) Take by mouth. Omega XL    . omeprazole (PRILOSEC) 20 MG capsule Take 20 mg by mouth daily.      . sertraline (ZOLOFT) 50 MG tablet Take 100 mg by mouth daily.     [name] SPIRIVA HANDIHALER 18 MCG inhalation capsule INHALE THE CONTENTS OF 1 CAPSULE VIA INHALATION DEVICE EVERY DAY 30 capsule 11  . valsartan (DIOVAN) 320 MG tablet Take 320 mg by mouth daily.     No current facility-administered medications on file prior to visit.

## 2016-09-26 NOTE — Telephone Encounter (Signed)
Called and spoke to pt. Informed him of the recs per CY. Rx sent to preferred pharmacy. Pt verbalized understanding and denied any further questions or concerns at this time.

## 2016-12-06 ENCOUNTER — Other Ambulatory Visit: Payer: Medicare Other

## 2016-12-06 ENCOUNTER — Encounter: Payer: Self-pay | Admitting: Internal Medicine

## 2016-12-06 ENCOUNTER — Ambulatory Visit (INDEPENDENT_AMBULATORY_CARE_PROVIDER_SITE_OTHER): Payer: Medicare Other | Admitting: Internal Medicine

## 2016-12-06 VITALS — BP 128/72 | HR 76 | Ht 66.0 in | Wt 210.8 lb

## 2016-12-06 DIAGNOSIS — G4733 Obstructive sleep apnea (adult) (pediatric): Secondary | ICD-10-CM | POA: Diagnosis not present

## 2016-12-06 DIAGNOSIS — R918 Other nonspecific abnormal finding of lung field: Secondary | ICD-10-CM | POA: Diagnosis not present

## 2016-12-06 DIAGNOSIS — J449 Chronic obstructive pulmonary disease, unspecified: Secondary | ICD-10-CM | POA: Diagnosis not present

## 2016-12-06 MED ORDER — FLUTICASONE-UMECLIDIN-VILANT 100-62.5-25 MCG/INH IN AEPB: 1.0000 | INHALATION_SPRAY | Freq: Every day | RESPIRATORY_TRACT | 0 refills | Status: DC

## 2016-12-06 MED ORDER — FLUTICASONE-UMECLIDIN-VILANT 100-62.5-25 MCG/INH IN AEPB: 1.0000 | INHALATION_SPRAY | Freq: Every day | RESPIRATORY_TRACT | 3 refills | Status: DC

## 2016-12-06 NOTE — Assessment & Plan Note (Addendum)
Persistent bronchitis exacerbation. [place] room air 07/02/16- recorded 3 minutes with saturation less than or equal to 88%. This does not qualify him for oxygen during sleep at this point. Plan-he will switch to Trelegy inhaler when he finishes his current supply of Advair/Spiriva. Doxycycline. Prescription sent for [facility]. Lab for alpha-1 antitrypsin level.

## 2016-12-06 NOTE — Patient Instructions (Addendum)
Order lab- a1AT assay       Dx COPD mixed type  Script sent for doxycycline antibiotic for bronchitis  Sample and printed script for Trelegy Ellipta inhaler.  We suggested you use up the Advair and Spiriva you have on hand, then switch to Trelegy.  We will call the results of overnight oximetry   Order- schedule unattended home sleep test      Dx OSA

## 2016-12-06 NOTE — Progress Notes (Signed)
Subjective:    Patient ID: Glen [name], male    DOB: [birth year], 76 y.o.   MRN: [id number]  HPI male former smoker followed for COPD, rhinitis, history urticaria, complicated by HBP [id number]-[id number] obstructive airways disease with response to bronchodilator, air trapping, normal diffusion. FEV1/FVC 0.52, TLC 93%, DLCO 87 Walk Test 06/07/2016-Baseline 94% desaturated only to 92% on room air CT chest 12/22/2015-1.2 cm right upper lobe groundglass pulmonary nodule-----Resolved CT 06/15/16 [place] room air 07/02/16- recorded 3 minutes with saturation less than or equal to 88%. ------------------------------------------------------------------  06/07/2016-76 year old male former smoker followed for COPD, lung nodule,  rhinitis, history urticaria, complicated by HBP FOLLOWS FOR: Pt states he continues to have increased chest congestion with cough-was given Zpak by PCP 2 weeks ago. Dr Dorthy Cooler is following a RUL nodule With next CT scheduled in February. Onset of acute bronchitic exacerbation 3 or 4 weeks ago. Had had flu shot. Still some persistent chest congestion. He complains of dyspnea on exertion especially with steps and stairs, noted probably over the last year. Denies chest pain or palpitations. Daily cough with scant clear phlegm or occasional slight green but no blood or fever. Patient seen in the office today and instructed on use of Trelegy.  Patient expressed understanding and demonstrated technique. [name]  Walk Test 06/07/2016-Baseline 94% desaturated only to 92% on room air CT chest 12/22/2015- f/u sched 06/25/16 IMPRESSION: 1. New 1.2 cm right upper lobe ground-glass pulmonary nodule. Initial follow-up with CT at 6-12 months is recommended to confirm persistence. If persistent, repeat CT is recommended every 2 years until 5 years of stability has been established. This recommendation follows the consensus statement: Guidelines for Management of Incidental  Pulmonary Nodules Detected on CT Images:From the Fleischner Society 2017; published online before print (10.1148/[id number]). 2. Previously described tiny pulmonary nodules measuring up to 4 mm are stable and probably benign. 3. Mild centrilobular emphysema. 4. Aortic atherosclerosis. Left main and 3 vessel coronary atherosclerosis. 5. Diffuse hepatic steatosis.  12/06/16- 76 year old male former smoker followed for COPD, lung nodule,  rhinitis, history urticaria, complicated by HBP FOLLOWS FOR: Pt continues to have cough with congestion-productive at times-clear to light green in color with wheezing. Likes Trelegy much better than Advair. He will use up her existing Advair and Spiriva then switch. Always feels irritated upper airway with cough productive white/yellow/green sputum. He snores loudly enough that his wife sleeps in separate room. He always feels tired but isn't sure how much of that is his known depression. We discussed sleep study, sleep apnea and CPAP. [name] room air 07/02/16- recorded 3 minutes with saturation less than or equal to 88%. CT chest 06/25/16- RUL groundglass nodule noted last year has resolved consistent with inflammatory lesion. IMPRESSION: Resolution of previously seen right upper lobe ground-glass nodule. Stable tiny right upper lobe nodule. No further follow-up is recommended. COPD Mild atelectasis in the left lung base Loves Trelegy - much better than Advair/Spiriva   ROS-see HPI  + = pos Constitutional:   No-   weight loss, night sweats, fevers, chills, +fatigue, lassitude. HEENT:   No-  headaches, difficulty swallowing, tooth/dental problems, sore throat,       No-  sneezing, itching, ear ache, nasal congestion, post nasal drip,  CV:  No-   chest pain, orthopnea, PND, swelling in lower extremities, anasarca, dizziness, palpitations Resp: +  shortness of breath with exertion or at rest.         + productive cough,  + non-productive cough,  No-  coughing up  of blood.              No-   change in color of mucus.  No- wheezing.   Skin: No-   rash or lesions. GI:  No-   heartburn, indigestion, abdominal pain, nausea, vomiting,  GU: . MS:  No-   joint pain or swelling.  . Neuro-     nothing unusual Psych:  No- change in mood or affect. + depression or anxiety.  No memory loss.  OBJ- Physical Exam General- Alert, Oriented, Affect-appropriate, Distress- none acute, + obese Skin- rash-none, lesions- none, excoriation- none Lymphadenopathy- none Head- atraumatic            Eyes- Gross vision intact, PERRLA, conjunctivae and secretions clear            Ears- Hearing, canals-normal            Nose- Clear, no-Septal dev, mucus, polyps, erosion, perforation             Throat- Mallampati II , mucosa clear , drainage- none, tonsils- atrophic Neck- flexible , trachea midline, no stridor , thyroid nl, carotid no bruit Chest - symmetrical excursion , unlabored           Heart/CV- RRR , no murmur , no gallop  , no rub, nl s1 s2                           - JVD- none , edema- none, stasis changes- none, varices- none           Lung- clear/distant/unlabored at rest, wheeze- none, cough- none , dullness-none, rub- none           Chest wall-  Abd-  Br/ Gen/ Rectal- Not done, not indicated Extrem- cyanosis- none, clubbing, none, atrophy- none, strength- nl Neuro- grossly intact to observation Assessment & Plan:

## 2016-12-06 NOTE — Assessment & Plan Note (Signed)
CT 06/25/16 showed resolution of the concerning right upper lobe groundglass nodule with no further follow-up recommended. He should still get a chest x-ray at least every other year.

## 2016-12-06 NOTE — Progress Notes (Signed)
COPD

## 2016-12-06 NOTE — Assessment & Plan Note (Signed)
History and examination suggest possibly significant obstructive sleep apnea. We discussed the disease process assessment and treatment. Plan-schedule sleep study. We will contact him with results and further plans if needed.

## 2016-12-09 ENCOUNTER — Telehealth: Payer: Self-pay | Admitting: Pulmonary Disease

## 2016-12-09 MED ORDER — DOXYCYCLINE HYCLATE 100 MG PO TABS: 100.0000 mg | ORAL_TABLET | Freq: Two times a day (BID) | ORAL | 0 refills | Status: DC

## 2016-12-09 NOTE — Telephone Encounter (Signed)
Doxycycline 100 mg, # 14, 1 twice daily        Sorry it wasn't sent

## 2016-12-09 NOTE — Telephone Encounter (Signed)
Called spoke with patient and apologized Rx was not sent Verified pharmacy Rx sent  Nothing further needed; will sign off

## 2016-12-09 NOTE — Telephone Encounter (Signed)
Spoke with pt. States that he was supposed to have a Doxycycline prescription sent in. This is listed on the pt's AVS but the prescription was not sent in.  CY - please advise on Doxy prescription. Thanks.

## 2016-12-10 LAB — ALPHA-1 ANTITRYPSIN PHENOTYPE: A-1 Antitrypsin: 154 mg/dL (ref 83–199)

## 2016-12-17 DIAGNOSIS — G4733 Obstructive sleep apnea (adult) (pediatric): Secondary | ICD-10-CM | POA: Diagnosis not present

## 2016-12-18 DIAGNOSIS — G4733 Obstructive sleep apnea (adult) (pediatric): Secondary | ICD-10-CM | POA: Diagnosis not present

## 2016-12-19 ENCOUNTER — Other Ambulatory Visit: Payer: Self-pay | Admitting: *Deleted

## 2016-12-19 DIAGNOSIS — G4733 Obstructive sleep apnea (adult) (pediatric): Secondary | ICD-10-CM

## 2016-12-27 ENCOUNTER — Telehealth: Payer: Self-pay | Admitting: Internal Medicine

## 2016-12-27 DIAGNOSIS — G4733 Obstructive sleep apnea (adult) (pediatric): Secondary | ICD-10-CM

## 2016-12-27 NOTE — Telephone Encounter (Signed)
CY please advise on the results of the sleep test.  Pt is calling today for these results.  Thanks  pts next ov is 12/08/2017

## 2016-12-27 NOTE — Telephone Encounter (Signed)
The home sleep study showed mild obstructive sleep apnea, with 8 apnea per hour of sleep. With this, the oxygen saturation was falling into the 70% range, and averaged only 88%. Because of the low oxygen scores, I recommend that we order       Sleep Center: CPAP titration overnight study for dx OSA  This will let us make sure that we can correct both the sleep apnea and the low oxygen levels.

## 2016-12-27 NOTE — Telephone Encounter (Signed)
Results have been explained to patient, pt expressed understanding.  Order placed for CPAP Titration Study  Nothing further needed.

## 2017-02-09 ENCOUNTER — Other Ambulatory Visit: Payer: Self-pay | Admitting: Internal Medicine

## 2017-02-20 ENCOUNTER — Ambulatory Visit (HOSPITAL_BASED_OUTPATIENT_CLINIC_OR_DEPARTMENT_OTHER): Payer: Medicare Other

## 2017-03-03 ENCOUNTER — Ambulatory Visit (HOSPITAL_BASED_OUTPATIENT_CLINIC_OR_DEPARTMENT_OTHER): Payer: Medicare Other | Attending: Internal Medicine | Admitting: Internal Medicine

## 2017-03-03 VITALS — Ht 66.0 in | Wt 203.0 lb

## 2017-03-03 DIAGNOSIS — G4733 Obstructive sleep apnea (adult) (pediatric): Secondary | ICD-10-CM | POA: Insufficient documentation

## 2017-03-03 DIAGNOSIS — G4761 Periodic limb movement disorder: Secondary | ICD-10-CM | POA: Diagnosis not present

## 2017-03-09 DIAGNOSIS — G4733 Obstructive sleep apnea (adult) (pediatric): Secondary | ICD-10-CM | POA: Diagnosis not present

## 2017-03-09 NOTE — Procedures (Signed)
  Patient Name: Glen [name], Glen [name] Date: 03/03/2017 Gender: Male D.O.B: 1940-10-21 Age (years): 76 Referring Provider: Baird Lyons MD, ABSM Height (inches): 66 Interpreting Physician: Baird Lyons MD, ABSM Weight (lbs): 203 RPSGT: Jorge Ny BMI: 33 MRN: [id number] Neck Size: 17.50 CLINICAL INFORMATION The patient is referred for a CPAP titration to treat sleep apnea.  Date of NPSG, Split Night or HST:  12/17/16  AHI 7.9/ hr, desaturation to 81%, body weight 210 lbs  SLEEP STUDY TECHNIQUE As per the AASM Manual for the Scoring of Sleep and Associated Events v2.3 (April 2016) with a hypopnea requiring 4% desaturations.  The channels recorded and monitored were frontal, central and occipital EEG, electrooculogram (EOG), submentalis EMG (chin), nasal and oral airflow, thoracic and abdominal wall motion, anterior tibialis EMG, snore microphone, electrocardiogram, and pulse oximetry. Continuous positive airway pressure (CPAP) was initiated at the beginning of the study and titrated to treat sleep-disordered breathing.  MEDICATIONS Medications self-administered by patient taken the night of the study : none reported  TECHNICIAN COMMENTS Comments added by technician: NONE  Comments added by scorer: N/A  RESPIRATORY PARAMETERS Optimal PAP Pressure (cm): 11 AHI at Optimal Pressure (/hr): 0.0 Overall Minimal O2 (%): 89.00 Supine % at Optimal Pressure (%): 0 Minimal O2 at Optimal Pressure (%): 90.0    SLEEP ARCHITECTURE The study was initiated at 11:07:43 PM and ended at 5:46:24 AM.  Sleep onset time was 11.5 minutes and the sleep efficiency was 79.0%. The total sleep time was 315.0 minutes.  The patient spent 7.30% of the night in stage N1 sleep, 71.59% in stage N2 sleep, 0.00% in stage N3 and 21.11% in REM.Stage REM latency was 247.0 minutes  Wake after sleep onset was 72.1. Alpha intrusion was absent. Supine sleep was 0.16%.  CARDIAC DATA The 2 lead EKG demonstrated  sinus rhythm. The mean heart rate was 63.42 beats per minute. Other EKG findings include: None.  LEG MOVEMENT DATA The total Periodic Limb Movements of Sleep (PLMS) were 147. The PLMS index was 28.00. A PLMS index of <15 is considered normal in adults.  IMPRESSIONS - The optimal PAP pressure was 11 cm of water. - Central sleep apnea was not noted during this titration (CAI = 0.0/h). - Mild oxygen desaturations were observed during this titration (min O2 = 89.00%). Minimum saturation on CPAP 11 was 90% on room air. - The patient snored with moderate snoring volume during this titration study. - No cardiac abnormalities were observed during this study. - Moderate periodic limb movements were observed during this study. Arousals associated with PLMs were significant.  DIAGNOSIS - Obstructive Sleep Apnea (327.23 [G47.33 ICD-10]) - Periodic Limb Movement Syndrome (327.51 [G47.61 ICD-10])  RECOMMENDATIONS - Trial of CPAP therapy on 11 cm H2O with a Medium size Resmed Full Face Mask Quattro FX mask and heated humidification. - Supplemental Oxygen was not needed at final CPAP. - Consider treatment for limb-movement sleep disorder. - Sleep hygiene should be reviewed to assess factors that may improve sleep quality. - Weight management and regular exercise should be initiated or continued.  [Electronically signed] 03/09/2017 05:21 PM  Baird Lyons MD, [place], American Board of Sleep Medicine   NPI: [id number]  [place], [place] [facility]  ELECTRONICALLY SIGNED ON:  03/09/2017, 5:18 PM [employer] PH: [phone]   FX: [phone] [place]

## 2017-06-23 ENCOUNTER — Other Ambulatory Visit: Payer: Self-pay | Admitting: Internal Medicine

## 2017-09-03 ENCOUNTER — Other Ambulatory Visit: Payer: Self-pay | Admitting: Internal Medicine

## 2017-09-03 ENCOUNTER — Other Ambulatory Visit: Payer: Self-pay | Admitting: *Deleted

## 2017-09-03 DIAGNOSIS — G4733 Obstructive sleep apnea (adult) (pediatric): Secondary | ICD-10-CM

## 2017-09-03 NOTE — Progress Notes (Signed)
Was able to talk to patient regarding patient's results.  They verbalized an understanding of what was discussed. No further questions at this time.  Order was placed for "I recommend we order new DME, new CPAP, auto 5-20, mask of choice, humidifier, supplies, airView   For dx OSA"  Patient was rescheduled for follow up due to the 90day window

## 2017-09-03 NOTE — Addendum Note (Signed)
Addended by: Amado Coe on: 09/03/2017 11:57 AM   Modules accepted: Orders

## 2017-09-04 ENCOUNTER — Telehealth: Payer: Self-pay | Admitting: Internal Medicine

## 2017-09-04 NOTE — Telephone Encounter (Signed)
Spoke with Bethanne Ginger at [facility], states that pt needs to be seen before a new cpap can be provided. Called pt to make aware- scheduled to see CY on Monday at 3:00.  Nothing further needed.

## 2017-09-08 ENCOUNTER — Encounter: Payer: Self-pay | Admitting: Internal Medicine

## 2017-09-08 ENCOUNTER — Ambulatory Visit: Payer: Medicare Other | Admitting: Internal Medicine

## 2017-09-08 VITALS — BP 122/80 | HR 84 | Ht 66.0 in | Wt 202.4 lb

## 2017-09-08 DIAGNOSIS — G4733 Obstructive sleep apnea (adult) (pediatric): Secondary | ICD-10-CM

## 2017-09-08 DIAGNOSIS — J449 Chronic obstructive pulmonary disease, unspecified: Secondary | ICD-10-CM | POA: Diagnosis not present

## 2017-09-08 MED ORDER — COMPRESSOR NEBULIZER MISC: 1.0000 [IU] | Freq: Once | 0 refills | Status: AC

## 2017-09-08 MED ORDER — IPRATROPIUM-ALBUTEROL 0.5-2.5 (3) MG/3ML IN SOLN: 3.0000 mL | Freq: Four times a day (QID) | RESPIRATORY_TRACT | 12 refills | Status: DC | PRN

## 2017-09-08 NOTE — Progress Notes (Signed)
Subjective:    Patient ID: Glen [name], male    DOB: [birth year], 77 y.o.   MRN: [id number]  HPI male former smoker followed for COPD, rhinitis, history urticaria, complicated by HBP [id number] obstructive airways disease with response to bronchodilator, air trapping, normal diffusion. FEV1/FVC 0.52, TLC 93%, DLCO 87 Walk Test 06/07/2016-Baseline 94% desaturated only to 92% on room air CT chest 12/22/2015-1.2 cm right upper lobe groundglass pulmonary nodule-----Resolved CT 06/15/16 ONOX room air 07/02/16- recorded 3 minutes with saturation less than or equal to 88%. HST 12/17/2016-AHI 7.9/hour, desaturation to 81%, body weight 210 pounds Walk Test 09/08/2017-3 laps/ 555 feet, lowest saturation 90%. ------------------------------------------------------------------  12/06/16- 77 year old male former smoker followed for COPD, lung nodule,  rhinitis, history urticaria, complicated by HBP FOLLOWS FOR: Pt continues to have cough with congestion-productive at times-clear to light green in color with wheezing. Likes Trelegy much better than Advair. He will use up her existing Advair and Spiriva then switch. Always feels irritated upper airway with cough productive white/yellow/green sputum. He snores loudly enough that his wife sleeps in separate room. He always feels tired but isn't sure how much of that is his known depression. We discussed sleep study, sleep apnea and CPAP. [place] room air 07/02/16- recorded 3 minutes with saturation less than or equal to 88%. CT chest 06/25/16- RUL groundglass nodule noted last year has resolved consistent with inflammatory lesion. IMPRESSION: Resolution of previously seen right upper lobe ground-glass nodule. Stable tiny right upper lobe nodule. No further follow-up is recommended. COPD Mild atelectasis in the left lung base Loves Trelegy - much better than Advair/Spiriva  09/08/2017- 77 year old male former smoker followed for COPD, lung nodule,   rhinitis, history urticaria, complicated by HBP ----Per patient, this OV is for his CPAP machine. Patient was advised that the machine was ordered back in October 2018, it was never ordered.  HST 12/17/2016-AHI 7.9/hour, desaturation to 81%, body weight 210 pounds. CPAP titration on 03/03/17-11 CWP, AHI 0/hour Pro-air, Trelegy, More aware of dyspnea on exertion gradually over time especially hills and stairs.  Little cough, scant clear mucus.  Denies blood, chest pain, adenopathy.  Trelegy continues to help. Educated about options for mild OSA.  Snoring is an issue and he wants to improve daytime sleepiness.  He is interested in trying CPAP.  We are starting CPAP auto 5-20. Walk Test 09/08/2017-3 laps, lowest saturation 90%.  ROS-see HPI  + = pos Constitutional:   No-   weight loss, night sweats, fevers, chills, +fatigue, lassitude. HEENT:   No-  headaches, difficulty swallowing, tooth/dental problems, sore throat,       No-  sneezing, itching, ear ache, nasal congestion, post nasal drip,  CV:  No-   chest pain, orthopnea, PND, swelling in lower extremities, anasarca, dizziness, palpitations Resp: +  shortness of breath with exertion or at rest.         + productive cough,  + non-productive cough,  No- coughing up of blood.              No-   change in color of mucus.  No- wheezing.   Skin: No-   rash or lesions. GI:  No-   heartburn, indigestion, abdominal pain, nausea, vomiting,  GU: . MS:  No-   joint pain or swelling.  . Neuro-     nothing unusual Psych:  No- change in mood or affect. + depression or anxiety.  No memory loss.  OBJ- Physical Exam General- Alert, Oriented, Affect-appropriate, Distress- none acute, + obese  Skin- rash-none, lesions- none, excoriation- none Lymphadenopathy- none Head- atraumatic            Eyes- Gross vision intact, PERRLA, conjunctivae and secretions clear            Ears- Hearing, canals-normal            Nose- Clear, no-Septal dev, mucus, polyps,  erosion, perforation             Throat- Mallampati II , mucosa clear , drainage- none, tonsils- atrophic Neck- flexible , trachea midline, no stridor , thyroid nl, carotid no bruit Chest - symmetrical excursion , unlabored           Heart/CV- RRR , no murmur , no gallop  , no rub, nl s1 s2                           - JVD- none , edema- none, stasis changes- none, varices- none           Lung- clear/distant/unlabored at rest, wheeze- none, cough- none , dullness-none, rub- none           Chest wall-  Abd-  Br/ Gen/ Rectal- Not done, not indicated Extrem- cyanosis- none, clubbing, none, atrophy- none, strength- nl Neuro- grossly intact to observation Assessment & Plan:

## 2017-09-08 NOTE — Patient Instructions (Signed)
Order- Walk test on room air- O2 qualifying     Dx COPD mixed type  Order- New DME, new CPAP auto 5-20, mask of choice, humidifier, supplies, AirView  Order-         DME-  Compressor nebulizer with  Duoneb neb solution   Dx COPD mixed type. Scripts are printed.

## 2017-09-09 NOTE — Assessment & Plan Note (Signed)
Appropriate discussion about treatment choices, good sleep habits, importance of weight and responsibility to drive safely. Plan-start CPAP auto 5-20

## 2017-09-09 NOTE — Assessment & Plan Note (Signed)
Trelegy has helped.  We are going to let him try home nebulizer machine with DuoNeb if needed-education done.  Regular walking for stamina and weight loss would also help.

## 2017-10-07 ENCOUNTER — Other Ambulatory Visit: Payer: Self-pay | Admitting: Internal Medicine

## 2017-10-20 ENCOUNTER — Other Ambulatory Visit: Payer: Self-pay | Admitting: Internal Medicine

## 2017-10-21 ENCOUNTER — Encounter: Payer: Self-pay | Admitting: Internal Medicine

## 2017-10-21 NOTE — Telephone Encounter (Signed)
Opened in error

## 2017-10-23 ENCOUNTER — Encounter: Payer: Self-pay | Admitting: Internal Medicine

## 2017-10-23 ENCOUNTER — Ambulatory Visit: Payer: Medicare Other | Admitting: Internal Medicine

## 2017-10-23 DIAGNOSIS — J449 Chronic obstructive pulmonary disease, unspecified: Secondary | ICD-10-CM | POA: Diagnosis not present

## 2017-10-23 DIAGNOSIS — G4733 Obstructive sleep apnea (adult) (pediatric): Secondary | ICD-10-CM | POA: Diagnosis not present

## 2017-10-23 NOTE — Patient Instructions (Signed)
We can continue CPAP auto 5-20, mask of choice humidifier, supplies, AirView  Ok to continue current meds   Please call if we can help

## 2017-10-23 NOTE — Progress Notes (Signed)
Subjective:    Patient ID: Glen [name], male    DOB: 1941-01-19, 77 y.o.   MRN: [id number]  HPI male former smoker followed for COPD,OSA,  rhinitis, history urticaria, complicated by HBP [id number] obstructive airways disease with response to bronchodilator, air trapping, normal diffusion. FEV1/FVC 0.52, TLC 93%, DLCO 87 Walk Test 06/07/2016-Baseline 94% desaturated only to 92% on room air CT chest 12/22/2015-1.2 cm right upper lobe groundglass pulmonary nodule-----Resolved CT 06/15/16 ONOX room air 07/02/16- recorded 3 minutes with saturation less than or equal to 88%. HST 12/17/2016-AHI 7.9/hour, desaturation to 81%, body weight 210 pounds Walk Test 09/08/2017-3 laps/ 555 feet, lowest saturation 90%. ------------------------------------------------------------------ 09/08/2017- 77 year old male former smoker followed for COPD, lung nodule,OSA,  rhinitis, history urticaria, complicated by HBP ----Per patient, this OV is for his CPAP machine. Patient was advised that the machine was ordered back in October 2018, it was never ordered.  HST 12/17/2016-AHI 7.9/hour, desaturation to 81%, body weight 210 pounds. CPAP titration on 03/03/17-11 CWP, AHI 0/hour Pro-air, Trelegy, More aware of dyspnea on exertion gradually over time especially hills and stairs.  Little cough, scant clear mucus.  Denies blood, chest pain, adenopathy.  Trelegy continues to help. Educated about options for mild OSA.  Snoring is an issue and he wants to improve daytime sleepiness.  He is interested in trying CPAP.  We are starting CPAP auto 5-20. Walk Test 09/08/2017-3 laps, lowest saturation 90%.  10/23/17- 77 year old male former smoker followed for COPD, lung nodule,OSA,  rhinitis, history urticaria, complicated by HBP CPAP auto 5-20/ [facility] ------follow up for CPAP. Uses [facility] for CPAP. States he has been doing well with CPAP. Asks about changing DME- unhappy with billing/ accounting process. Proair, Trelegy,  Duoneb,  Baseline mild DOE with routine activity, but Trelegy helps. Cough productive after nebs.   ROS-see HPI  + = positive Constitutional:   No-   weight loss, night sweats, fevers, chills, +fatigue, lassitude. HEENT:   No-  headaches, difficulty swallowing, tooth/dental problems, sore throat,       No-  sneezing, itching, ear ache, nasal congestion, post nasal drip,  CV:  No-   chest pain, orthopnea, PND, swelling in lower extremities, anasarca, dizziness, palpitations Resp: +  shortness of breath with exertion or at rest.         + productive cough,  + non-productive cough,  No- coughing up of blood.              No-   change in color of mucus.  No- wheezing.   Skin: No-   rash or lesions. GI:  No-   heartburn, indigestion, abdominal pain, nausea, vomiting,  GU: . MS:  No-   joint pain or swelling.  . Neuro-     nothing unusual Psych:  No- change in mood or affect. + depression or anxiety.  No memory loss.  OBJ- Physical Exam General- Alert, Oriented, Affect-appropriate, Distress- none acute, + obese Skin- rash-none, lesions- none, excoriation- none Lymphadenopathy- none Head- atraumatic            Eyes- Gross vision intact, PERRLA, conjunctivae and secretions clear            Ears- Hearing, canals-normal            Nose- Clear, no-Septal dev, mucus, polyps, erosion, perforation             Throat- Mallampati II , mucosa clear , drainage- none, tonsils- atrophic Neck- flexible , trachea midline, no stridor , thyroid nl, carotid no  bruit Chest - symmetrical excursion , unlabored           Heart/CV- RRR , no murmur , no gallop  , no rub, nl s1 s2                           - JVD- none , edema- none, stasis changes- none, varices- none           Lung- +coarse/distant/unlabored at rest, wheeze- none, cough- none ,                      dullness-none, rub- none           Chest wall-  Abd-  Br/ Gen/ Rectal- Not done, not indicated Extrem- cyanosis- none, clubbing, none, atrophy- none,  strength- nl Neuro- grossly intact to observation Assessment & Plan:

## 2017-11-04 NOTE — Assessment & Plan Note (Signed)
Better symptom control with Trelegy.  No change needed.

## 2017-11-04 NOTE — Assessment & Plan Note (Signed)
Continues to benefit from CPAP with better sleep. No break in therapy and good compliance and control. Plan- continue CPAP auto 5-20

## 2017-11-27 ENCOUNTER — Ambulatory Visit: Payer: Medicare Other | Admitting: Internal Medicine

## 2017-12-08 ENCOUNTER — Ambulatory Visit: Payer: Medicare Other | Admitting: Internal Medicine

## 2017-12-10 ENCOUNTER — Ambulatory Visit: Payer: Medicare Other | Admitting: Internal Medicine

## 2018-01-08 ENCOUNTER — Other Ambulatory Visit: Payer: Self-pay | Admitting: Internal Medicine

## 2018-04-02 ENCOUNTER — Telehealth: Payer: Self-pay | Admitting: Neurology

## 2018-04-02 NOTE — Progress Notes (Signed)
Subjective:   Glen [name] was seen in consultation in the movement disorder clinic at the request of Koirala, Dibas, MD.  The evaluation is for tremor.  The records that were made available to me were reviewed.  Pt reports that he has had tremor since he was a child.  States that he remembers it with stress.  It has gotten a little worse with time.  Both hands are the same.  He is L hand dominant.  Mom and tremor and so did dad but "his was alcohol induced."   Affected by caffeine:  Doesn't drink enough to know Affected by alcohol:  No. (drinks wine and crown with dinner) Affected by stress:  Yes.   (states that he had had a "melt down" on memorial day weekend and started seeing psychology - been on zoloft and xanax for years - never takes more than 1 xanax a day) Affected by fatigue:  Yes.   Spills soup if on spoon:  Yes.   Spills glass of liquid if full:  No. Affects ADL's (tying shoes, brushing teeth, etc):  No.  Current/Previously tried tremor medications: xanax  Current medications that may exacerbate tremor:  Spiriva/ventolin/advair  Outside reports reviewed: lab reports and office notes.  04/03/18 update: Patient is seen today in follow-up, but this time requests to be seen for neuropathy.  I have not seen the patient in 2-1/2 years.  When I saw him then it was for essential tremor and he opted to hold off on any medication at that point in time.  Tremor waxes and wanes.  Sometimes his wife has to feed him.   I do have some records from his primary care physician, the most recent of which was dated December 26, 2017.  The note did mention stable tremor.  It also noted that the patient had some degree of peripheral neuropathy.  However, at that point in time, it was reported to be stable and they opted to hold on doing a further evaluation, with the exception of doing a hemoglobin A1c which was 6.4 and a B12 which was 143.   He is taking 1024mcg every other day since about July.   Pt  states "my toes are numb and cold."  States that he has stabbing pain in calves and thighs.  He is seeing Dr. Nelva Bush for shots in "back and knees."  Allergies  Allergen Reactions  . Aspirin     REACTION: pt has bleeding ulcers  . Lisinopril     Other reaction(s): cough    Outpatient Encounter Medications as of 04/03/2018  Medication Sig  . acetaminophen (TYLENOL) 325 MG tablet Take 650 mg by mouth every 6 (six) hours as needed for mild pain.  [name] ALPRAZolam (XANAX) 0.25 MG tablet Take 0.25 mg by mouth daily as needed for anxiety.   [name] amLODipine (NORVASC) 10 MG tablet Take 1 tablet by mouth Daily.  [name] aspirin EC 81 MG tablet Take 81 mg by mouth daily.  [name] atorvastatin (LIPITOR) 40 MG tablet Take 40 mg by mouth daily.  . diphenhydrAMINE (BENADRYL) 25 MG tablet Take 25 mg by mouth every 6 (six) hours as needed.  . hydrochlorothiazide (MICROZIDE) 12.5 MG capsule Take 12.5 mg by mouth daily.   [name] ipratropium-albuterol (DUONEB) 0.5-2.5 (3) MG/3ML SOLN Take 3 mLs by nebulization every 6 (six) hours as needed.  [name] omeprazole (PRILOSEC) 20 MG capsule Take 20 mg by mouth daily.    [name] PROAIR HFA 108 (90 Base) MCG/ACT inhaler  INHALE 2 PUFFS INTO THE LUNGS EVERY 4 HOURS AS NEEDED FOR WHEEZING OR SHORTNESS OF BREATH.  . sertraline (ZOLOFT) 100 MG tablet Take 1 tablet by mouth daily.  [name] telmisartan (MICARDIS) 40 MG tablet Take 40 mg by mouth daily.  . TRELEGY ELLIPTA 100-62.5-25 MCG/INH AEPB INHALE 1 PUFF INTO THE LUNGS DAILY   No facility-administered encounter medications on file as of 04/03/2018.     Past Medical History:  Diagnosis Date  . Allergic rhinitis, cause unspecified   . Anxiety   . Asthma   . Depression   . Hypertension   . PUD (peptic ulcer disease)    avoids aspirin    Past Surgical History:  Procedure Laterality Date  . CATARACT EXTRACTION, BILATERAL    . COLONOSCOPY    . NASAL SEPTUM SURGERY  1960's    Social History   Socioeconomic History  . Marital status: Married     Spouse name: Not on file  . Number of children: 0  . Years of education: Not on file  . Highest education level: Not on file  Occupational History  . Occupation: retired Psychiatric nurse  Social Needs  . Financial resource strain: Not on file  . Food insecurity:    Worry: Not on file    Inability: Not on file  . Transportation needs:    Medical: Not on file    Non-medical: Not on file  Tobacco Use  . Smoking status: Former Smoker    Packs/day: 0.50    Types: Cigarettes    Last attempt to quit: 04/12/2011    Years since quitting: 6.9  . Smokeless tobacco: Never Used  Substance and Sexual Activity  . Alcohol use: Yes    Alcohol/week: 0.0 standard drinks    Comment: 1 liquor drink, 1 glass wine daily  . Drug use: No  . Sexual activity: Not on file  Lifestyle  . Physical activity:    Days per week: Not on file    Minutes per session: Not on file  . Stress: Not on file  Relationships  . Social connections:    Talks on phone: Not on file    Gets together: Not on file    Attends religious service: Not on file    Active member of club or organization: Not on file    Attends meetings of clubs or organizations: Not on file    Relationship status: Not on file  . Intimate partner violence:    Fear of current or ex partner: Not on file    Emotionally abused: Not on file    Physically abused: Not on file    Forced sexual activity: Not on file  Other Topics Concern  . Not on file  Social History Narrative  . Not on file    Family Status  Relation Name Status  . Father  Deceased       heart disease, suicide, EtOHic  . Mother  Deceased       breast cancer  . Sister  Alive       brain tumor, anxiety, depression  . Mat Uncle  (Not Specified)    Review of Systems Chronic SOB with COPD.  Otherwise, Complete 10 system ROS was obtained and was negative apart from what is mentioned.   Objective:   VITALS:   Vitals:   04/03/18 0950  BP: 132/60  Pulse: 90  SpO2:  96%  Weight: 203 lb (92.1 kg)  Height: 5' 6.5" (1.689 m)   Gen:  Appears stated age and in NAD. HEENT:  Normocephalic, atraumatic. The mucous membranes are moist. The superficial temporal arteries are without ropiness or tenderness. Cardiovascular: Regular rate rhythm Lungs: Clear to auscultation bilaterally Neck: There is a left carotid bruit.  NEUROLOGICAL:  Orientation:  The patient is alert and oriented x3 Cranial nerves: There is good facial symmetry.  Extraocular muscles are intact.  Visual fields are full.  Speech is fluent and clear.  Soft palate rises symmetrically.  Hearing is intact to conversational tone. Tone: Tone is good throughout. Sensation: Sensation is intact to light touch and pinprick throughout (facial, trunk, extremities).  Pinprick was not particularly decreased in a stocking distribution.  Vibration is absent at the bilateral big toe and decreased at the ankle bilaterally.  There is no extinction with double simultaneous stimulation. There is no sensory dermatomal level identified. Coordination:  The patient has no dysdiadichokinesia or dysmetria. Motor: Strength is 5/5 in the bilateral upper and lower extremities.  Shoulder shrug is equal bilaterally.  There is no pronator drift.  There are no fasciculations noted. DTR's: Deep tendon reflexes are 2+-3/4 at the bilateral biceps, triceps, brachioradialis, patella and 0/4 at the bilateral achilles.  Plantar responses are downgoing bilaterally. Gait and Station: The patient is able to ambulate without difficulty.  He did have difficulty ambulating in a tandem fashion and was unable to do this.  MOVEMENT EXAM: Tremor:  There is tremor in the UE, noted most significantly with action and particularly when the arm is held distal and not proximal.  There is intention tremor.  The patient has some trouble with Archimedes spirals.  Labs:  Lab work was dated December 26, 2017.  Hemoglobin A1c was 6.4.  TSH was 1.51.  B12 was low  at 143.  White blood cells were 5.0, hemoglobin 13.0, hematocrit 37.5 and platelets 285.  Sodium was 139, potassium 3.6, chloride 102, CO2 29, BUN 18, creatinine 0.95, glucose 116, AST 17, ALT 13, alkaline phosphatase 63   Assessment/Plan:   1.  Peripheral neuropathy  -The patient has clinical examination evidence of a diffuse peripheral neuropathy, which certainly can affect gait and balance.  We discussed safety associated with peripheral neuropathy.  We discussed balance therapy and the importance of ambulatory assistive device for balance assistance.  His A1C was elevated in August.  I will repeat this.  There is data to suggest that neuropathies can develop even prior to a formal diagnosis of diabetes mellitus.  In addition, the patient did have a B12 deficiency, which also can contribute to neuropathy.  We will recheck labs today.  Will include iron and ferritin as he describes some restlessness to the legs at night as well.  -Symptoms are most bothersome at night so we will add most of the gabapentin at night.  He will work his way up so that he is taking 100 mg in the daytime and 300 mg at night.  He will let me know if he has any issues.  Discussed risk, benefits, and side effects.  2.  L carotid bruit  -He will have a carotid ultrasound.  3.  Hyperreflexia  -He does have some hyperreflexia, but did just not describe any significant neck pain, so we decided to hold off on further scanning.  This really had not changed since I last saw him a few years ago.  4.  Essential Tremor.  -This is evidenced by the symmetrical nature and longstanding hx of gradually getting worse.  We discussed nature and pathophysiology.  We discussed that this can continue to gradually get worse with time.  We discussed that some medications can worsen this, as can caffeine use.  We discussed medication therapy as well as surgical therapy.  Ultimately, the patient decided to hold off on further medications.    5.   Follow-up in the next few months, sooner should new neurologic issues arise.

## 2018-04-02 NOTE — Progress Notes (Deleted)
Subjective:   Glen [name] was seen in consultation in the movement disorder clinic at the request of Koirala, Dibas, MD.  The evaluation is for tremor.  The records that were made available to me were reviewed.  Pt reports that he has had tremor since he was a child.  States that he remembers it with stress.  It has gotten a little worse with time.  Both hands are the same.  He is L hand dominant.  Mom and tremor and so did dad but "his was alcohol induced."   Affected by caffeine:  Doesn't drink enough to know Affected by alcohol:  No. (drinks wine and crown with dinner) Affected by stress:  Yes.   (states that he had had a "melt down" on memorial day weekend and started seeing psychology - been on zoloft and xanax for years - never takes more than 1 xanax a day) Affected by fatigue:  Yes.   Spills soup if on spoon:  Yes.   Spills glass of liquid if full:  No. Affects ADL's (tying shoes, brushing teeth, etc):  No.  Current/Previously tried tremor medications: xanax  Current medications that may exacerbate tremor:  Spiriva/ventolin/advair  Outside reports reviewed: lab reports and office notes.  04/03/18 update: Patient is seen today for tremor.  I have not seen him in 2-1/2 years.  When I saw him 2-1/2 years ago, the patient did not want any medication for his tremor.  Allergies  Allergen Reactions  . Aspirin     REACTION: pt has bleeding ulcers  . Lisinopril     Other reaction(s): cough    Outpatient Encounter Medications as of 04/03/2018  Medication Sig  . acetaminophen (TYLENOL) 325 MG tablet Take 650 mg by mouth every 6 (six) hours as needed for mild pain.  [name] ALPRAZolam (XANAX) 0.25 MG tablet Take 0.25 mg by mouth daily as needed for anxiety.   [name] amLODipine (NORVASC) 10 MG tablet Take 1 tablet by mouth Daily.  [name] aspirin EC 81 MG tablet Take 81 mg by mouth daily.  [name] atorvastatin (LIPITOR) 40 MG tablet Take 40 mg by mouth daily.  . hydrochlorothiazide (MICROZIDE) 12.5  MG capsule Take 12.5 mg by mouth daily.   [name] ipratropium-albuterol (DUONEB) 0.5-2.5 (3) MG/3ML SOLN Take 3 mLs by nebulization every 6 (six) hours as needed.  [name] omeprazole (PRILOSEC) 20 MG capsule Take 20 mg by mouth daily.    [name] PROAIR HFA 108 (90 Base) MCG/ACT inhaler INHALE 2 PUFFS INTO THE LUNGS EVERY 4 HOURS AS NEEDED FOR WHEEZING OR SHORTNESS OF BREATH.  . sertraline (ZOLOFT) 100 MG tablet Take 1 tablet by mouth daily.  [name] telmisartan (MICARDIS) 40 MG tablet Take 40 mg by mouth daily.  . TRELEGY ELLIPTA 100-62.5-25 MCG/INH AEPB INHALE 1 PUFF INTO THE LUNGS DAILY   No facility-administered encounter medications on file as of 04/03/2018.     Past Medical History:  Diagnosis Date  . Allergic rhinitis, cause unspecified   . Anxiety   . Asthma   . Depression   . Hypertension   . PUD (peptic ulcer disease)    avoids aspirin    Past Surgical History:  Procedure Laterality Date  . CATARACT EXTRACTION, BILATERAL    . COLONOSCOPY    . NASAL SEPTUM SURGERY  1960's    Social History   Socioeconomic History  . Marital status: Married    Spouse name: Not on file  . Number of children: 0  . Years of education: Not  on file  . Highest education level: Not on file  Occupational History  . Occupation: retired Psychiatric nurse  Social Needs  . Financial resource strain: Not on file  . Food insecurity:    Worry: Not on file    Inability: Not on file  . Transportation needs:    Medical: Not on file    Non-medical: Not on file  Tobacco Use  . Smoking status: Former Smoker    Packs/day: 0.50    Types: Cigarettes    Last attempt to quit: 04/12/2011    Years since quitting: 6.9  . Smokeless tobacco: Never Used  Substance and Sexual Activity  . Alcohol use: Yes    Alcohol/week: 0.0 standard drinks    Comment: 1 liquor drink, 1 glass wine daily  . Drug use: No  . Sexual activity: Not on file  Lifestyle  . Physical activity:    Days per week: Not on file    Minutes per  session: Not on file  . Stress: Not on file  Relationships  . Social connections:    Talks on phone: Not on file    Gets together: Not on file    Attends religious service: Not on file    Active member of club or organization: Not on file    Attends meetings of clubs or organizations: Not on file    Relationship status: Not on file  . Intimate partner violence:    Fear of current or ex partner: Not on file    Emotionally abused: Not on file    Physically abused: Not on file    Forced sexual activity: Not on file  Other Topics Concern  . Not on file  Social History Narrative  . Not on file    Family Status  Relation Name Status  . Father  Deceased       heart disease, suicide, EtOHic  . Mother  Deceased       breast cancer  . Sister  Alive       brain tumor, anxiety, depression    Review of Systems ROS    Objective:   VITALS:   There were no vitals filed for this visit. GEN:  The patient appears stated age and is in NAD. HEENT:  Normocephalic, atraumatic.  The mucous membranes are moist. The superficial temporal arteries are without ropiness or tenderness. CV:  RRR Lungs:  CTAB Neck/HEME:  There are no carotid bruits bilaterally.  Neurological examination:  Orientation: The patient is alert and oriented x3. Cranial nerves: There is good facial symmetry. The speech is fluent and clear. Soft palate rises symmetrically and there is no tongue deviation. Hearing is intact to conversational tone. Sensation: Sensation is intact to light touch throughout Motor: Strength is 5/5 in the bilateral upper and lower extremities.   Shoulder shrug is equal and symmetric.  There is no pronator drift.  Movement examination: Tone: There is ***tone in the RUE.  There is ***tone in the LUE.  There is ***tone in the RLE.  There is ***tone in the LLE.  Abnormal movements: *** Coordination:  There is *** decremation with RAM's, *** Gait and Station: The patient has *** difficulty  arising out of a deep-seated chair without the use of the hands. The patient's stride length is ***.  The patient has a *** pull test.      Labs:    Chemistry      Component Value Date/Time   NA 139 07/23/2013 1237  K 4.1 07/23/2013 1237   CL 100 07/23/2013 1237   CO2 23 07/23/2013 1237   BUN 21 07/23/2013 1237   CREATININE 0.86 07/23/2013 1237      Component Value Date/Time   CALCIUM 9.0 07/23/2013 1237   ALKPHOS 64 07/23/2013 1237   AST 19 07/23/2013 1237   ALT 19 07/23/2013 1237   BILITOT 0.2 (L) 07/23/2013 1237     Lab Results  Component Value Date   TSH 1.48 11/20/2015    ***   Assessment/Plan:   1.  ***

## 2018-04-02 NOTE — Telephone Encounter (Signed)
Patient made aware. Appt moved. Records requested from PCP.

## 2018-04-02 NOTE — Telephone Encounter (Signed)
Glen [name] - can you call patient please.  He is on schedule for tomorrow and it says f/u neuropathy.  I haven't ever seen him for that.  I saw him 2.5 years ago for tremor.  I generally don't seen neuropathy patients so I will need some information on that if that is what it is for.  We may need to move him to NP 9:45 slot as well

## 2018-04-03 ENCOUNTER — Encounter: Payer: Self-pay | Admitting: Neurology

## 2018-04-03 ENCOUNTER — Other Ambulatory Visit (INDEPENDENT_AMBULATORY_CARE_PROVIDER_SITE_OTHER): Payer: Medicare Other

## 2018-04-03 ENCOUNTER — Encounter

## 2018-04-03 ENCOUNTER — Ambulatory Visit: Payer: Medicare Other | Admitting: Neurology

## 2018-04-03 VITALS — BP 132/60 | HR 90 | Ht 66.5 in | Wt 203.0 lb

## 2018-04-03 DIAGNOSIS — R0989 Other specified symptoms and signs involving the circulatory and respiratory systems: Secondary | ICD-10-CM

## 2018-04-03 DIAGNOSIS — E538 Deficiency of other specified B group vitamins: Secondary | ICD-10-CM | POA: Diagnosis not present

## 2018-04-03 DIAGNOSIS — E1142 Type 2 diabetes mellitus with diabetic polyneuropathy: Secondary | ICD-10-CM | POA: Diagnosis not present

## 2018-04-03 DIAGNOSIS — G250 Essential tremor: Secondary | ICD-10-CM

## 2018-04-03 DIAGNOSIS — R251 Tremor, unspecified: Secondary | ICD-10-CM | POA: Diagnosis not present

## 2018-04-03 DIAGNOSIS — G2581 Restless legs syndrome: Secondary | ICD-10-CM

## 2018-04-03 MED ORDER — GABAPENTIN 100 MG PO CAPS: ORAL_CAPSULE | ORAL | 5 refills | Status: DC

## 2018-04-03 NOTE — Patient Instructions (Signed)
1. Your provider has requested that you have labwork completed today. Please go to [facility] ([id number]) on the second floor of this building before leaving the office today. You do not need to check in. If you are not called within 15 minutes please check with the front desk.   2. We have you scheduled for your Carotid Doppler on 04/06/18 at 11:00 am. Please arrive 15 minutes prior and go to first floor radiology at [facility]. If this is not a good date/time please call [phone] to reschedule.   3. Start Gabapentin as follows: 100 mg in the morning, 100 mg in the evening for one week,  THEN 100 mg in the morning, 200 mg in the evening for one week,  THEN 100 mg in the morning, 300 mg in the evening  Stay on this dosage. Prescription sent to the pharmacy.

## 2018-04-06 ENCOUNTER — Ambulatory Visit (HOSPITAL_COMMUNITY)
Admission: RE | Admit: 2018-04-06 | Discharge: 2018-04-06 | Disposition: A | Discharge status: Home / Self Care | Payer: Medicare Other | Source: Ambulatory Visit | Attending: Neurology | Admitting: Neurology

## 2018-04-06 ENCOUNTER — Telehealth: Payer: Self-pay | Admitting: Neurology

## 2018-04-06 DIAGNOSIS — R0989 Other specified symptoms and signs involving the circulatory and respiratory systems: Secondary | ICD-10-CM

## 2018-04-06 DIAGNOSIS — I6523 Occlusion and stenosis of bilateral carotid arteries: Secondary | ICD-10-CM

## 2018-04-06 NOTE — Telephone Encounter (Signed)
Patient made aware. He has already started taking B12 daily.

## 2018-04-06 NOTE — Telephone Encounter (Signed)
Patient left a VM returning your call

## 2018-04-06 NOTE — Telephone Encounter (Signed)
Left message on machine for patient to call back.

## 2018-04-06 NOTE — Telephone Encounter (Signed)
-----   Message from [place], DO sent at 04/06/2018  1:30 PM EST ----- Luvenia Starch, tell patient that need to do CTA carotid to better evaluate how much blockage is there.

## 2018-04-06 NOTE — Telephone Encounter (Signed)
-----   Message from [place], DO sent at 04/06/2018  7:26 AM EST ----- Let pt know that B12 is still too low.  He was taking 1052mcg qod.  Have him take 1056mcg daily

## 2018-04-06 NOTE — Progress Notes (Signed)
Bilateral carotid duplex completed. Preliminary results Right 60% to 79% ICA stenosis lower in of range. Left - 1% to 39% ICA stenosis. Bilateral >50% ECA stenosis. Vertebral artey flow is antegrade bilaterally. [facility], [place]  04/06/2018 12:15 PM

## 2018-04-07 LAB — PROTEIN ELECTROPHORESIS,RANDOM URN
Creatinine, Urine: 62 mg/dL (ref 20–320)
PROTEIN/CREAT RATIO: 97 mg/g{creat} (ref 22–128)
Protein/Creatinine Ratio: 0.097 mg/mg creat (ref 0.022–0.12)
Total Protein, Urine: 6 mg/dL (ref 5–25)

## 2018-04-07 LAB — PROTEIN ELECTROPHORESIS, SERUM
Albumin ELP: 3.4 g/dL — ABNORMAL LOW (ref 3.8–4.8)
Alpha 1: 0.4 g/dL — ABNORMAL HIGH (ref 0.2–0.3)
Alpha 2: 0.7 g/dL (ref 0.5–0.9)
Beta 2: 0.3 g/dL (ref 0.2–0.5)
Beta Globulin: 0.4 g/dL (ref 0.4–0.6)
Gamma Globulin: 0.6 g/dL — ABNORMAL LOW (ref 0.8–1.7)
TOTAL PROTEIN: 5.9 g/dL — AB (ref 6.1–8.1)

## 2018-04-07 LAB — IRON,TIBC AND FERRITIN PANEL
%SAT: 21 % (ref 20–48)
Ferritin: 65 ng/mL (ref 24–380)
Iron: 55 ug/dL (ref 50–180)
TIBC: 263 mcg/dL (calc) (ref 250–425)

## 2018-04-07 LAB — VITAMIN B12: Vitamin B-12: 244 pg/mL (ref 200–1100)

## 2018-04-07 LAB — CBC WITH DIFFERENTIAL/PLATELET
BASOS ABS: 32 {cells}/uL (ref 0–200)
Basophils Relative: 0.6 %
EOS ABS: 162 {cells}/uL (ref 15–500)
Eosinophils Relative: 3 %
HEMATOCRIT: 37.1 % — AB (ref 38.5–50.0)
HEMOGLOBIN: 12.4 g/dL — AB (ref 13.2–17.1)
LYMPHS ABS: 891 {cells}/uL (ref 850–3900)
MCH: 30.5 pg (ref 27.0–33.0)
MCHC: 33.4 g/dL (ref 32.0–36.0)
MCV: 91.2 fL (ref 80.0–100.0)
MPV: 9.3 fL (ref 7.5–12.5)
Monocytes Relative: 7.6 %
NEUTROS ABS: 3904 {cells}/uL (ref 1500–7800)
Neutrophils Relative %: 72.3 %
Platelets: 242 10*3/uL (ref 140–400)
RBC: 4.07 10*6/uL — ABNORMAL LOW (ref 4.20–5.80)
RDW: 14.5 % (ref 11.0–15.0)
Total Lymphocyte: 16.5 %
WBC: 5.4 10*3/uL (ref 3.8–10.8)
WBCMIX: 410 {cells}/uL (ref 200–950)

## 2018-04-07 LAB — HEMOGLOBIN A1C
EAG (MMOL/L): 7.7 (calc)
HEMOGLOBIN A1C: 6.5 %{Hb} — AB (ref <5.7)
Mean Plasma Glucose: 140 (calc)

## 2018-04-07 LAB — EXTRA URINE SPECIMEN

## 2018-04-07 LAB — IMMUNOFIXATION INTE

## 2018-04-07 LAB — IMMUNOFIXATION ELECTROPHORESIS
IMMUNOGLOBULIN A: 175 mg/dL (ref 20–320)
IgG (Immunoglobin G), Serum: 631 mg/dL (ref 600–1540)
IgM, Serum: 69 mg/dL (ref 50–300)

## 2018-04-07 NOTE — Telephone Encounter (Signed)
Patient made aware of results and is okay to proceed with testing.

## 2018-04-07 NOTE — Telephone Encounter (Signed)
Order entered. [facility] should call patient directly to schedule testing.

## 2018-04-20 ENCOUNTER — Telehealth: Payer: Self-pay | Admitting: Neurology

## 2018-04-20 NOTE — Telephone Encounter (Signed)
Left message on machine for patient to call back.   To let him know I called [facility] and scheduled him for his CT angio at [place] for Thursday 04/23/18 at 10:30 am to arrive at 10:10 am. No solid food 4 hours prior. Awaiting call back from patient to make him aware of appointment information.

## 2018-04-20 NOTE — Telephone Encounter (Signed)
I relayed info below but he said he couldn't make the CT on Thursday as his wife is having knee surgery. I forwarded him back to your voicemail. But please reschedule this appt for him. Thanks!

## 2018-04-20 NOTE — Telephone Encounter (Signed)
Patient called and lmom regarding not having heard from anyone to schedule his MRI. Thanks

## 2018-04-20 NOTE — Telephone Encounter (Signed)
Patient given number to [facility] to call them directly to reschedule [phone].

## 2018-04-23 ENCOUNTER — Other Ambulatory Visit: Payer: Medicare Other

## 2018-04-26 ENCOUNTER — Encounter: Payer: Self-pay | Admitting: Internal Medicine

## 2018-04-27 ENCOUNTER — Ambulatory Visit: Payer: Medicare Other | Admitting: Internal Medicine

## 2018-04-27 ENCOUNTER — Encounter: Payer: Self-pay | Admitting: Internal Medicine

## 2018-04-27 ENCOUNTER — Other Ambulatory Visit: Payer: Self-pay | Admitting: Internal Medicine

## 2018-04-27 ENCOUNTER — Telehealth: Payer: Self-pay | Admitting: Neurology

## 2018-04-27 ENCOUNTER — Telehealth: Payer: Self-pay | Admitting: Internal Medicine

## 2018-04-27 ENCOUNTER — Ambulatory Visit
Admission: RE | Admit: 2018-04-27 | Discharge: 2018-04-27 | Disposition: A | Discharge status: Home / Self Care | Payer: Medicare Other | Source: Ambulatory Visit | Attending: Neurology | Admitting: Neurology

## 2018-04-27 VITALS — BP 126/72 | HR 80 | Ht 66.0 in | Wt 198.0 lb

## 2018-04-27 DIAGNOSIS — J449 Chronic obstructive pulmonary disease, unspecified: Secondary | ICD-10-CM

## 2018-04-27 DIAGNOSIS — G4733 Obstructive sleep apnea (adult) (pediatric): Secondary | ICD-10-CM | POA: Diagnosis not present

## 2018-04-27 DIAGNOSIS — I6523 Occlusion and stenosis of bilateral carotid arteries: Secondary | ICD-10-CM

## 2018-04-27 IMAGING — CT CT ANGIO NECK
2 of 7 series · 5 of 33 positions shown · IV contrast (iopamidol)
Comparison: None.

CLINICAL DATA: Bilateral carotid artery stenosis.

Creatinine was obtained on site at [HOSPITAL] at [REDACTED].
Results: Creatinine 1.1 mg/dL.
EXAM:
CT ANGIOGRAPHY NECK
TECHNIQUE: Multidetector CT imaging of the neck was performed using the
standard protocol during bolus administration of intravenous
contrast. Multiplanar CT image reconstructions and MIPs were
obtained to evaluate the vascular anatomy. Carotid stenosis
measurements (when applicable) are obtained utilizing NASCET
criteria, using the distal internal carotid diameter as the
denominator.
CONTRAST:  75mL [UK] IOPAMIDOL ([UK]) INJECTION 76%

[Series 7: cta neck 1.00 bv48 s3 ax ax thin mips · axial · 0.61mm/px · z∈[-871,-715]mm · 4 of 262 slices shown]
[im 53/262  soft-tissue]
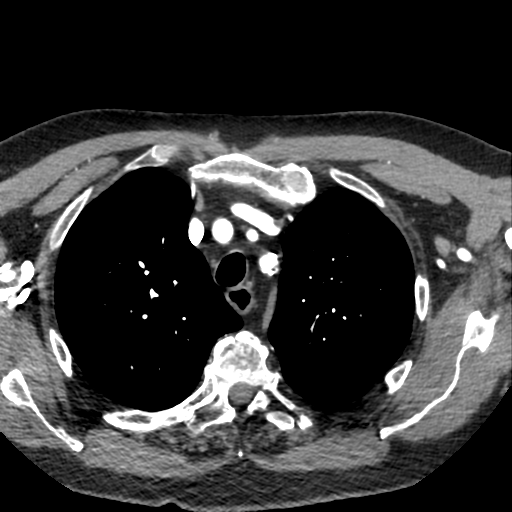
[im 105/262  bone]
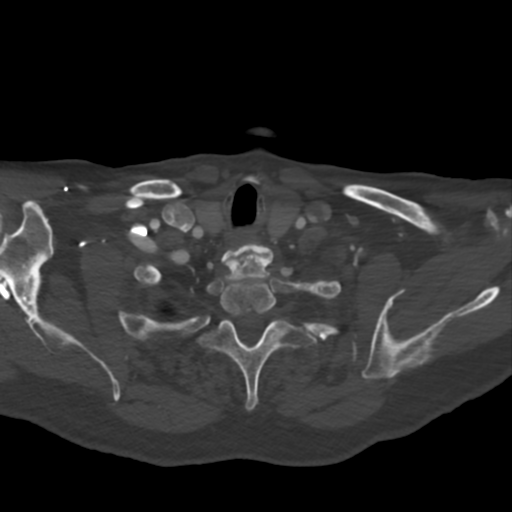
[im 157/262  soft-tissue]
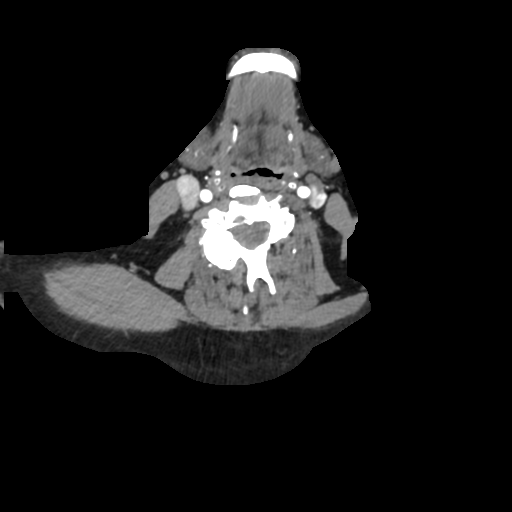
[im 209/262  bone]
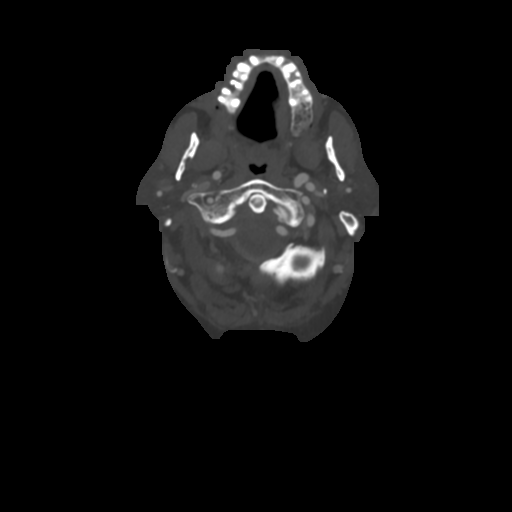

[Series 11: cta neck 1.00 bv48 s3 sag sag thin mips · sagittal · 0.51mm/px · 1 of 310 slices shown]
[im 155/310  soft-tissue]
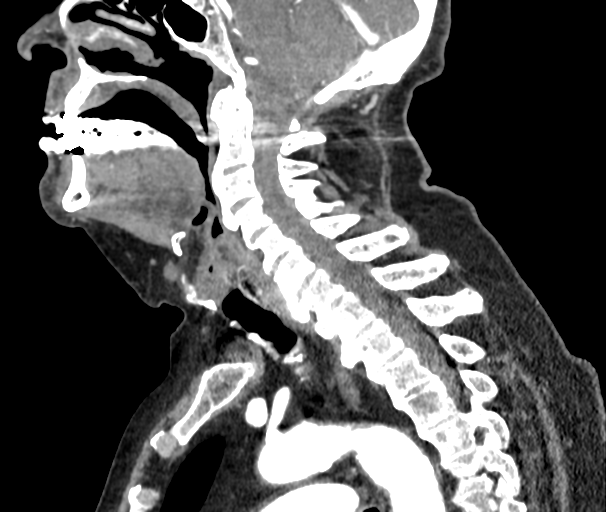

[5 of 33 positions shown; findings below may reference images not displayed]

FINDINGS: Skeleton: There is no bony spinal canal stenosis. No lytic or
blastic lesion.

Other neck: Normal pharynx, larynx and major salivary glands. No
cervical lymphadenopathy. Unremarkable thyroid gland.

Upper chest: No pneumothorax or pleural effusion. No nodules or
masses.

Aortic arch: There is mild calcific atherosclerosis of the aortic
arch. There is no aneurysm, dissection or hemodynamically
significant stenosis of the visualized ascending aorta and aortic
arch. Conventional 3 vessel aortic branching pattern. The visualized
proximal subclavian arteries are widely patent.

Right carotid system:

--Common carotid artery: Widely patent origin without common carotid
artery dissection or aneurysm.

--Internal carotid artery: No dissection, occlusion or aneurysm.
Predominantly calcified atherosclerotic disease at the bifurcation,
extending into the internal carotid artery, resulting in 65%
stenosis.

--External carotid artery: No acute abnormality.

Left carotid system:

--Common carotid artery: Widely patent origin without common carotid
artery dissection or aneurysm.

--Internal carotid artery:No dissection, occlusion or aneurysm.
Mixed calcified and non-calcified atherosclerotic disease at the
bifurcation, extending into the internal carotid artery, resulting
in less than 50% stenosis.

--External carotid artery: Severe proximal stenosis.

Vertebral arteries: Codominant configuration. Both origins are
normal. No dissection, occlusion or flow-limiting stenosis to the
vertebrobasilar confluence.

Review of the MIP images confirms the above findings
IMPRESSION: 1. 65% stenosis of the proximal right internal carotid artery due to
predominantly calcified atherosclerotic disease.
2. No hemodynamically significant stenosis of the left internal
carotid artery.
3. Severe proximal stenosis of the left external carotid artery.
4.  Aortic atherosclerosis ([UK]-[UK]).

## 2018-04-27 MED ORDER — LEVALBUTEROL HCL 0.63 MG/3ML IN NEBU: 0.6300 mg | INHALATION_SOLUTION | RESPIRATORY_TRACT | 12 refills | Status: DC | PRN

## 2018-04-27 MED ORDER — IOPAMIDOL (ISOVUE-370) INJECTION 76%
75.0000 mL | Freq: Once | INTRAVENOUS | Status: AC | PRN
  Administered 2018-04-27: 75 mL via INTRAVENOUS

## 2018-04-27 NOTE — Patient Instructions (Signed)
Order- DME Reatha Armour)   Please change neb solution from Duoneb to levalbuterol- script printed                                   2) We can continue CPAP auto 5-20, mask of choice, humidifier, supplies, AirView  Ok to continue other meds.  Please call if we can help

## 2018-04-27 NOTE — Telephone Encounter (Signed)
-----   Message from [place], DO sent at 04/27/2018  3:50 PM EST ----- Let pt know that RICA with 65% stenosis.  Doesn't need surgery but refer to vascular so that they can follow him since not following with me regularly now

## 2018-04-27 NOTE — Progress Notes (Signed)
Subjective:    Patient ID: Glen [name], male    DOB: 18-Sep-1940, 77 y.o.   MRN: [id number]  HPI male former smoker followed for COPD,OSA,  rhinitis, history urticaria, complicated by HBP [id number] obstructive airways disease with response to bronchodilator, air trapping, normal diffusion. FEV1/FVC 0.52, TLC 93%, DLCO 87 Walk Test 06/07/2016-Baseline 94% desaturated only to 92% on room air CT chest 12/22/2015-1.2 cm right upper lobe groundglass pulmonary nodule-----Resolved CT 06/15/16 ONOX room air 07/02/16- recorded 3 minutes with saturation less than or equal to 88%. HST 12/17/2016-AHI 7.9/hour, desaturation to 81%, body weight 210 pounds Walk Test 09/08/2017-3 laps/ 555 feet, lowest saturation 90%. ------------------------------------------------------------------ 10/23/17- 77 year old male former smoker followed for COPD, lung nodule,OSA,  rhinitis, history urticaria, complicated by HBP CPAP auto 5-20/ [facility] ------follow up for CPAP. Uses [facility] for CPAP. States he has been doing well with CPAP. Asks about changing DME- unhappy with billing/ accounting process. Proair, Trelegy, Duoneb,  Baseline mild DOE with routine activity, but Trelegy helps. Cough productive after nebs.   04/27/2018 - 77 year old male former smoker followed for COPD, lung nodule,OSA,  rhinitis, history urticaria, complicated by HBP CPAP auto 5-20/ [facility] -----OSA: DME [facility]. Pt wears CPAP nightly.  Neb DuoNeb, pro-air HFA, Trelegy, Download 100% compliance AHI 1.4/hour.  He put CPAP on even for naps.  He and wife use separate bedrooms but he is not aware of snoring through or movement disturbance. No recent respiratory exacerbation.  Dyspnea does interfere with his banjo playing trips.  Nebulizer treatments with albuterol make his tremor worse so we discussed trying Xopenex.  ROS-see HPI  + = positive Constitutional:   No-   weight loss, night sweats, fevers, chills, +fatigue, lassitude. HEENT:    No-  headaches, difficulty swallowing, tooth/dental problems, sore throat,       No-  sneezing, itching, ear ache, nasal congestion, post nasal drip,  CV:  No-   chest pain, orthopnea, PND, swelling in lower extremities, anasarca, dizziness, palpitations Resp: +  shortness of breath with exertion or at rest.         + productive cough,  + non-productive cough,  No- coughing up of blood.              No-   change in color of mucus.  No- wheezing.   Skin: No-   rash or lesions. GI:  No-   heartburn, indigestion, abdominal pain, nausea, vomiting,  GU: . MS:  No-   joint pain or swelling.  . Neuro-     nothing unusual Psych:  No- change in mood or affect. + depression or anxiety.  No memory loss.  OBJ- Physical Exam General- Alert, Oriented, Affect-appropriate, Distress- none acute, + obese Skin- rash-none, lesions- none, excoriation- none Lymphadenopathy- none Head- atraumatic            Eyes- Gross vision intact, PERRLA, conjunctivae and secretions clear            Ears- Hearing, canals-normal            Nose- Clear, no-Septal dev, mucus, polyps, erosion, perforation             Throat- Mallampati II , mucosa clear , drainage- none, tonsils- atrophic Neck- flexible , trachea midline, no stridor , thyroid nl, carotid no bruit Chest - symmetrical excursion , unlabored           Heart/CV- RRR , no murmur , no gallop  , no rub, nl s1 s2                           -  JVD- none , edema- none, stasis changes- none, varices- none           Lung- +quiet, distant, wheeze- none, cough- none , dullness-none, rub- none           Chest wall-  Abd-  Br/ Gen/ Rectal- Not done, not indicated Extrem- cyanosis- none, clubbing, none, atrophy- none, strength- nl Neuro- grossly intact to observation Assessment & Plan:

## 2018-04-27 NOTE — Telephone Encounter (Signed)
Called patient, unable to reach left message to give us a call back. 

## 2018-04-28 NOTE — Telephone Encounter (Signed)
Patient made aware and is okay with referral to vascular surgery. Referral entered to [facility]. They should call patient to schedule.

## 2018-04-28 NOTE — Telephone Encounter (Signed)
Called and spoke with pt letting him know the call we received from [place] in regards to his neb sol. Pt stated he received a call from [facility] yesterday, 04/27/18 stating she had faxed the Rx to pt's main pharmacy and pt stated to me he received a call from pharmacy stating his neb sol Rx is ready for pick up.  Nothing further needed.

## 2018-04-29 ENCOUNTER — Ambulatory Visit (INDEPENDENT_AMBULATORY_CARE_PROVIDER_SITE_OTHER): Payer: Medicare Other | Admitting: Vascular Surgery

## 2018-04-29 ENCOUNTER — Encounter: Payer: Self-pay | Admitting: Vascular Surgery

## 2018-04-29 VITALS — BP 103/61 | HR 73 | Temp 97.6°F | Resp 18 | Ht 66.5 in | Wt 202.0 lb

## 2018-04-29 DIAGNOSIS — I6521 Occlusion and stenosis of right carotid artery: Secondary | ICD-10-CM

## 2018-04-29 NOTE — Progress Notes (Signed)
REASON FOR CONSULT:    Carotid stenosis.  The consult is requested by Dr. Carles Collet.  HPI:   Glen [name] is a pleasant 77 y.o. adult, who was found to have a carotid bruit.  This prompted a duplex scan which showed a 60 to 79% right carotid stenosis.  Subsequent CT scan confirmed a moderate right carotid stenosis.  The patient was sent for vascular consultation.  The patient is left-handed.  He denies any history of stroke, TIAs, expressive or receptive aphasia, or amaurosis fugax.  He has no real problems with dizziness.  He is on a statin.  He does not like to take aspirin because he says that he bruises easily.  I reviewed the note from Dr. Doristine Devoid office dated 04/03/2018.  The patient was seen in consultation in the movement disorder clinic.  This was for a tremor.  On exam the patient had evidence of peripheral neuropathy.  The patient was also found to have a left carotid bruit which prompted the work-up.  He also has an essential tremor and hyperreflexia.  The patient's risk factors for peripheral vascular disease include hypertension and a remote history of tobacco use.  He quit smoking 3 to 4 years ago.  He denies any history of diabetes, hypercholesterolemia, or family history of premature cardiovascular disease.  Past Medical History:  Diagnosis Date  . Allergic rhinitis, cause unspecified   . Anxiety   . Asthma   . Depression   . Hypertension   . PUD (peptic ulcer disease)    avoids aspirin    Family History  Problem Relation Age of Onset  . Alcohol abuse Father        commited suicide  . Heart disease Father        had bypass  . Breast cancer Mother   . Prostate cancer Maternal Uncle        several     SOCIAL HISTORY: Social History   Socioeconomic History  . Marital status: Married    Spouse name: Not on file  . Number of children: 0  . Years of education: Not on file  . Highest education level: Not on file  Occupational History  . Occupation: retired  Psychiatric nurse  Social Needs  . Financial resource strain: Not on file  . Food insecurity:    Worry: Not on file    Inability: Not on file  . Transportation needs:    Medical: Not on file    Non-medical: Not on file  Tobacco Use  . Smoking status: Former Smoker    Packs/day: 0.50    Types: Cigarettes    Last attempt to quit: 04/12/2011    Years since quitting: 7.0  . Smokeless tobacco: Never Used  Substance and Sexual Activity  . Alcohol use: Yes    Alcohol/week: 0.0 standard drinks    Comment: 1 liquor drink, 1 glass wine daily  . Drug use: No  . Sexual activity: Not on file  Lifestyle  . Physical activity:    Days per week: Not on file    Minutes per session: Not on file  . Stress: Not on file  Relationships  . Social connections:    Talks on phone: Not on file    Gets together: Not on file    Attends religious service: Not on file    Active member of club or organization: Not on file    Attends meetings of clubs or organizations: Not on file  Relationship status: Not on file  . Intimate partner violence:    Fear of current or ex partner: Not on file    Emotionally abused: Not on file    Physically abused: Not on file    Forced sexual activity: Not on file  Other Topics Concern  . Not on file  Social History Narrative  . Not on file    Allergies  Allergen Reactions  . Aspirin     REACTION: pt has bleeding ulcers  . Lisinopril     Other reaction(s): cough    Current Outpatient Medications  Medication Sig Dispense Refill  . acetaminophen (TYLENOL) 325 MG tablet Take 650 mg by mouth every 6 (six) hours as needed for mild pain.    [name] ALPRAZolam (XANAX) 0.25 MG tablet Take 0.25 mg by mouth daily as needed for anxiety.     [name] amLODipine (NORVASC) 10 MG tablet Take 1 tablet by mouth Daily.    [name] aspirin EC 81 MG tablet Take 81 mg by mouth daily.    [name] atorvastatin (LIPITOR) 40 MG tablet Take 40 mg by mouth daily.    . diphenhydrAMINE (BENADRYL) 25 MG  tablet Take 25 mg by mouth every 6 (six) hours as needed.    . gabapentin (NEURONTIN) 100 MG capsule 1 in the morning, 3 in the evening 120 capsule 5  . hydrochlorothiazide (MICROZIDE) 12.5 MG capsule Take 12.5 mg by mouth daily.     [name] levalbuterol (XOPENEX) 0.63 MG/3ML nebulizer solution USE 1 VIAL VIA NEBULIZER EVERY 4 HOURS AS NEEDED FOR WHEEZING OR SHORTNESS OF BREATH 1650 mL 1  . omeprazole (PRILOSEC) 20 MG capsule Take 20 mg by mouth daily.      [name] PROAIR HFA 108 (90 Base) MCG/ACT inhaler INHALE 2 PUFFS INTO THE LUNGS EVERY 4 HOURS AS NEEDED FOR WHEEZING OR SHORTNESS OF BREATH. 8.5 g 0  . sertraline (ZOLOFT) 100 MG tablet Take 1 tablet by mouth daily.  4  . telmisartan (MICARDIS) 40 MG tablet Take 40 mg by mouth daily.    . TRELEGY ELLIPTA 100-62.5-25 MCG/INH AEPB INHALE 1 PUFF INTO THE LUNGS DAILY 180 each 1   No current facility-administered medications for this visit.     REVIEW OF SYSTEMS:  [X]  denotes positive finding, [ ]  denotes negative finding Cardiac  Comments:  Chest pain or chest pressure:    Shortness of breath upon exertion: x   Short of breath when lying flat:    Irregular heart rhythm:        Vascular    Pain in calf, thigh, or hip brought on by ambulation:   Right hip pain.  Pain in feet at night that wakes you up from your sleep:     Blood clot in your veins:    Leg swelling:         Pulmonary    Oxygen at home:    Productive cough:     Wheezing:  x  history of COPD      Neurologic    Sudden weakness in arms or legs:     Sudden numbness in arms or legs:     Sudden onset of difficulty speaking or slurred speech:    Temporary loss of vision in one eye:     Problems with dizziness:         Gastrointestinal    Blood in stool:     Vomited blood:         Genitourinary    Burning when urinating:  Blood in urine:        Psychiatric    Major depression:         Hematologic    Bleeding problems:    Problems with blood clotting too easily:          Skin    Rashes or ulcers:        Constitutional    Fever or chills:     PHYSICAL EXAM:   Vitals:   04/29/18 0858 04/29/18 0900  BP: (!) 111/56 103/61  Pulse: 73   Resp: 18   Temp: 97.6 F (36.4 C)   SpO2: 95%   Weight: 202 lb (91.6 kg)   Height: 5' 6.5" (1.689 m)     GENERAL: The patient is a well-nourished adult, in no acute distress. The vital signs are documented above. CARDIAC: There is a regular rate and rhythm.  VASCULAR: He has bilateral carotid bruits. He has palpable femoral, dorsalis pedis, posterior tibial pulses bilaterally. He has no significant lower extremity swelling. PULMONARY: There is good air exchange bilaterally without wheezing or rales. ABDOMEN: Soft and non-tender with normal pitched bowel sounds.  I do not palpate an abdominal aortic aneurysm. MUSCULOSKELETAL: There are no major deformities or cyanosis. NEUROLOGIC: No focal weakness or paresthesias are detected. SKIN: There are no ulcers or rashes noted. PSYCHIATRIC: The patient has a normal affect.  DATA:    CAROTID DUPLEX: I have reviewed his carotid duplex scan that was done on 04/06/2018.  On the right side he has a 60 to 79% carotid stenosis in the lower end of that range.  The right vertebral artery is patent with antegrade flow.  On the left side he has a less than 39% stenosis.  The left vertebral artery is patent with antegrade flow.  CT ANGIOGRAM NECK: I reviewed his CT angiogram of the neck that was done on 04/27/2018.  This showed evidence of a 65% proximal right internal carotid artery stenosis from a calcific plaque.  There was also a external carotid artery stenosis on the left.  There was no significant stenosis of the left internal carotid artery.  Of note on my review of the images the carotid bifurcation does seem slightly high.   ASSESSMENT & PLAN:   ASYMPTOMATIC 60 TO 79% RIGHT CAROTID STENOSIS: This patient has an asymptomatic 60 to 79% right carotid stenosis.  I explained  that we would not consider carotid endarterectomy unless the stenosis progressed to greater than 80% or he develop new right hemispheric symptoms.  I encouraged him to continue his statin and also his aspirin.  We discussed the importance of exercise and nutrition also.  I ordered a follow-up carotid duplex scan in 6 months and I will see him back at that time.  He knows to call sooner if he has problems.  We have reviewed the symptoms of cerebrovascular disease.   Deitra Mayo [facility] [phone]

## 2018-06-22 ENCOUNTER — Other Ambulatory Visit: Payer: Self-pay | Admitting: Gastroenterology

## 2018-06-22 DIAGNOSIS — K6389 Other specified diseases of intestine: Secondary | ICD-10-CM

## 2018-06-30 ENCOUNTER — Ambulatory Visit
Admission: RE | Admit: 2018-06-30 | Discharge: 2018-06-30 | Disposition: A | Discharge status: Home / Self Care | Payer: Medicare Other | Source: Ambulatory Visit | Attending: Gastroenterology | Admitting: Gastroenterology

## 2018-06-30 ENCOUNTER — Encounter: Payer: Self-pay | Admitting: Radiology

## 2018-06-30 DIAGNOSIS — K6389 Other specified diseases of intestine: Secondary | ICD-10-CM

## 2018-06-30 IMAGING — CT CT ABD-PELV W/ CM
2 of 5 series · 13 of 46 positions shown, 15 images · IV contrast (iopamidol)
Comparison: None.

CLINICAL DATA: Colon mass, severe constipation and abdominal
discomfort

EXAM:
CT ABDOMEN AND PELVIS WITH CONTRAST
TECHNIQUE: Multidetector CT imaging of the abdomen and pelvis was performed
using the standard protocol following bolus administration of
intravenous contrast.
CONTRAST:  100mL [NL] IOPAMIDOL ([NL]) INJECTION 61%

[Series 2: abd pelvis 5.00 br40 s3 ax · axial · 0.69mm/px · z∈[+1258,+1638]mm · 10 of 86 slices shown, 12 images]
[im 5/86  soft-tissue]
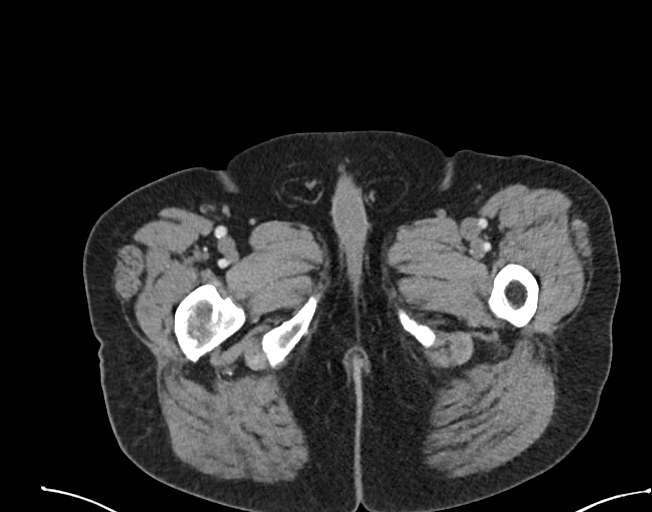
[im 5/86  bone]
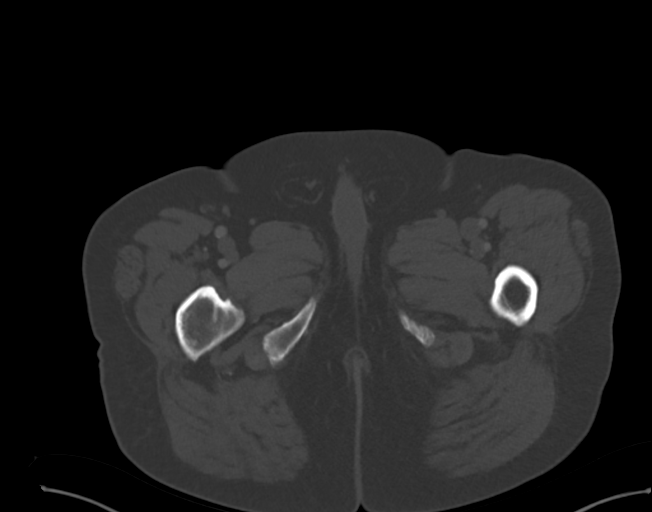
[im 14/86  soft-tissue]
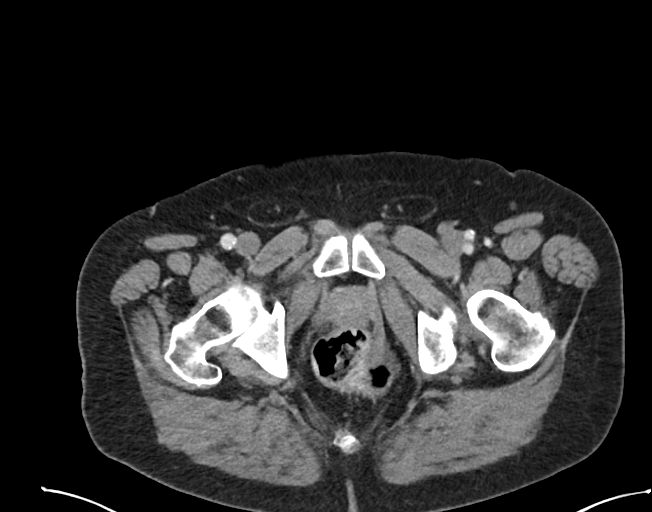
[im 23/86  soft-tissue]
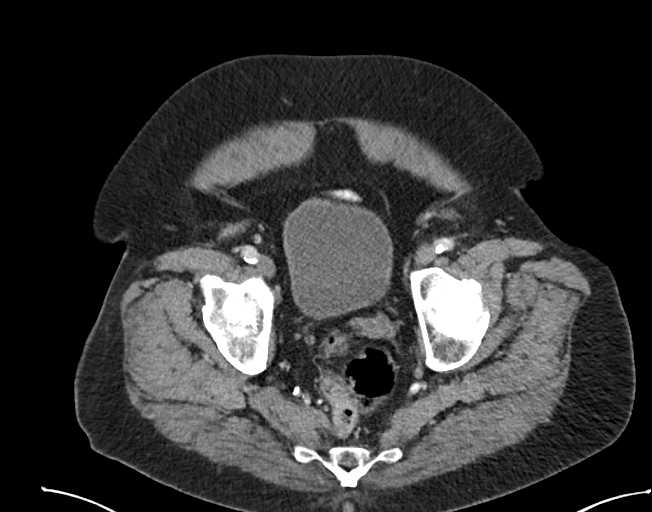
[im 32/86  soft-tissue]
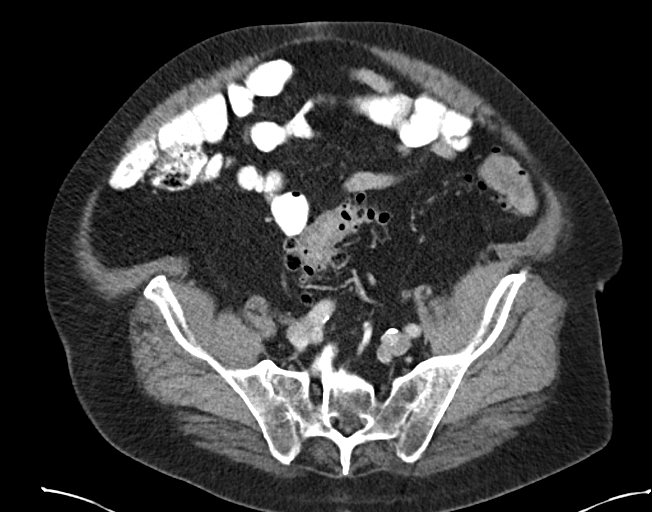
[im 41/86  soft-tissue]
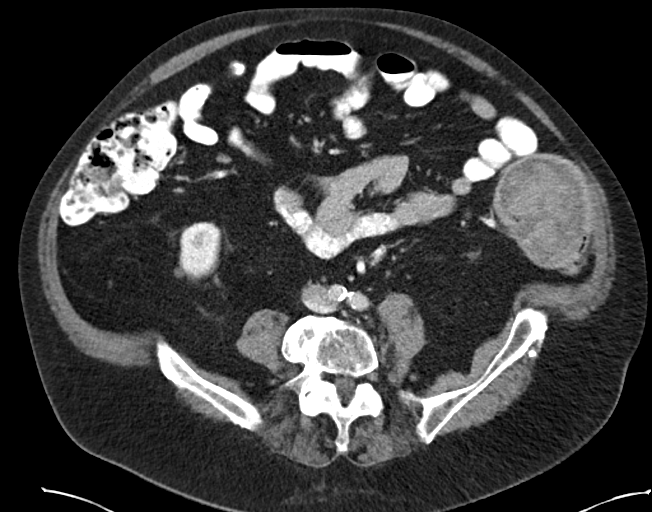
[im 45/86  soft-tissue]
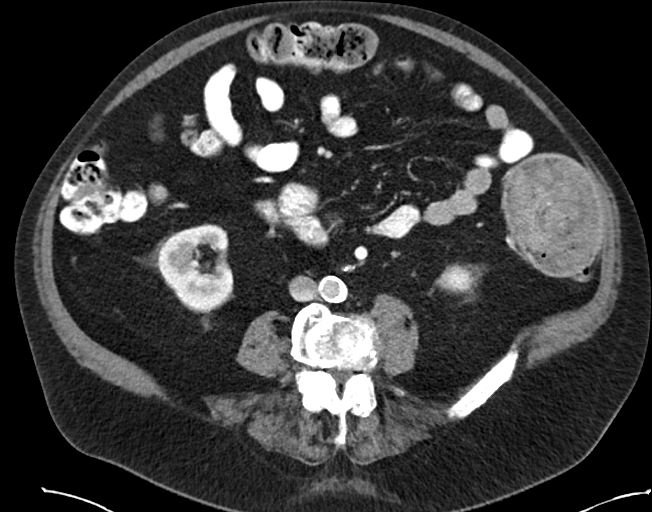
[im 54/86  soft-tissue]
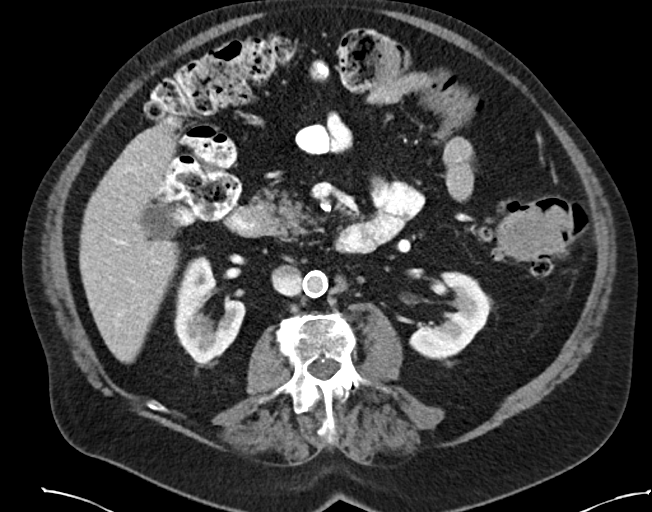
[im 63/86  soft-tissue]
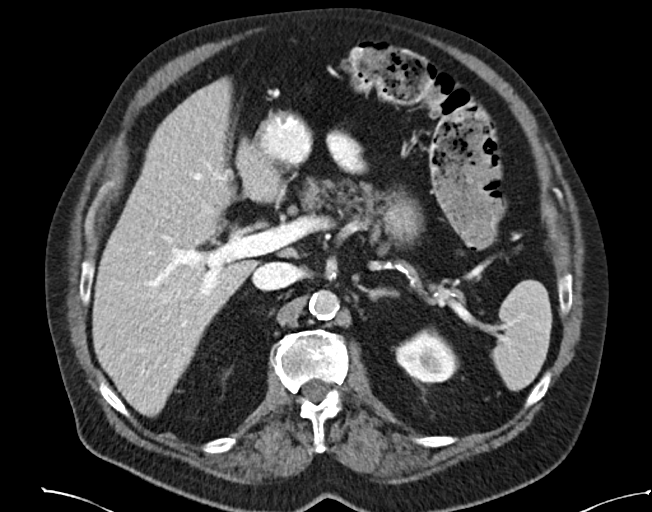
[im 72/86  soft-tissue]
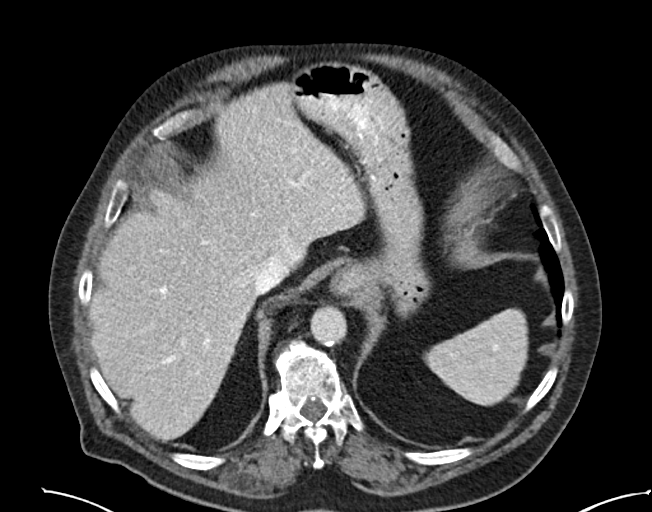
[im 72/86  bone]
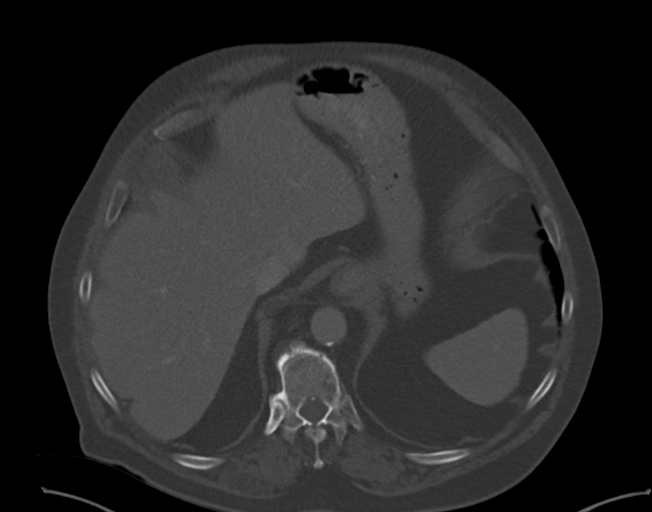
[im 81/86  soft-tissue]
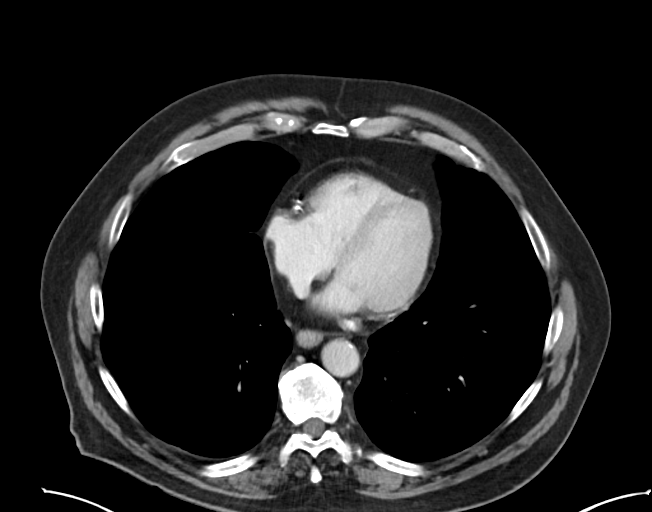

[Series 9: abd pelvis 2.00 br40 s3 cor · coronal · 0.84mm/px · 3 of 175 slices shown]
[im 59/175  soft-tissue]
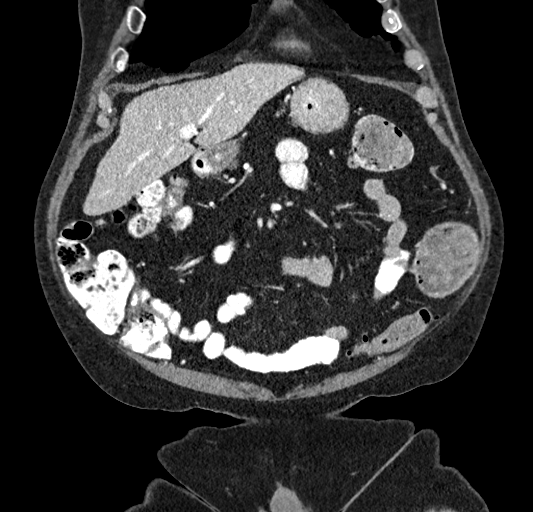
[im 78/175  soft-tissue]
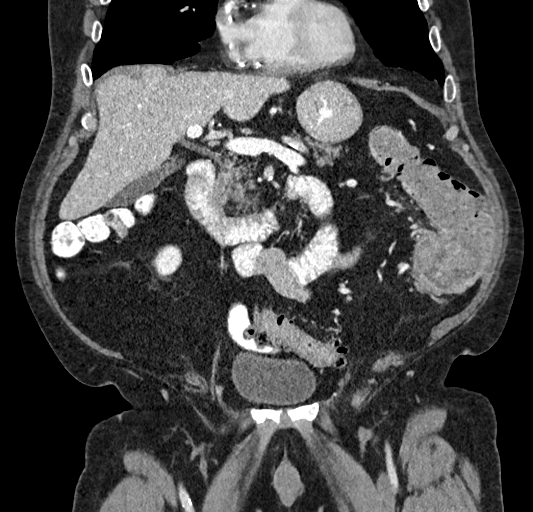
[im 97/175  soft-tissue]
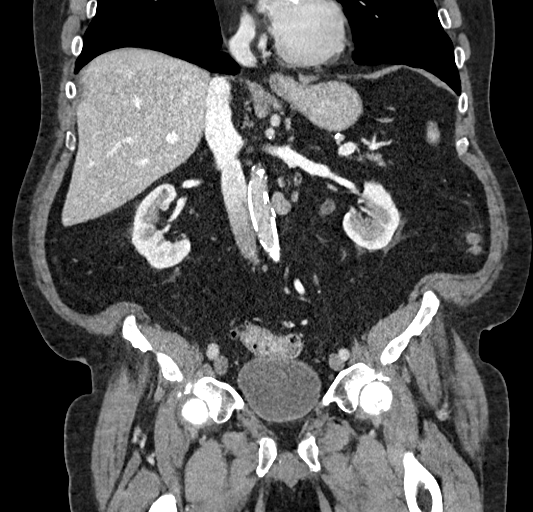

[13 of 46 positions shown; findings below may reference images not displayed]

FINDINGS: Lower chest: Coronary artery calcifications.

Hepatobiliary: No focal liver abnormality is seen. No gallstones,
gallbladder wall thickening, or biliary dilatation.

Pancreas: Unremarkable. No pancreatic ductal dilatation or
surrounding inflammatory changes.

Spleen: Normal in size without focal abnormality.

Adrenals/Urinary Tract: Adrenal glands are unremarkable. Kidneys are
normal, without renal calculi, focal lesion, or hydronephrosis.
Bladder is unremarkable.

Stomach/Bowel: Stomach is within normal limits. Severe descending
and sigmoid diverticulosis. There is a focal dilatation of the mid
descending colon to approximately 8.5 x 6.2 cm. This appears to
contain stool material although there may be a small focal
intussusception and endoluminal mass (series 9, image 72). There is
no overt exophytic mass or mesenteric lymphadenopathy. There is a
large burden of stool in the colon proximal to this lesion.

Vascular/Lymphatic: Extensive calcific atherosclerosis. No enlarged
abdominal or pelvic lymph nodes.

Reproductive: No mass or other abnormality.

Other: No abdominal wall hernia or abnormality. No abdominopelvic
ascites.

Musculoskeletal: No acute or significant osseous findings.
IMPRESSION: 1. Severe descending and sigmoid diverticulosis. There is a focal
dilatation of the mid descending colon to approximately 8.5 x
cm. This appears to contain stool material although there may be a
small focal intussusception and endoluminal mass (series 9, image
72). There is no overt exophytic mass or mesenteric lymphadenopathy.
There is a large burden of stool in the colon proximal to this
lesion.

2.  No evidence of abdominal metastatic disease.

3.  Chronic and incidental findings as detailed above.

## 2018-06-30 MED ORDER — IOPAMIDOL (ISOVUE-300) INJECTION 61%
100.0000 mL | Freq: Once | INTRAVENOUS | Status: AC | PRN
  Administered 2018-06-30: 100 mL via INTRAVENOUS

## 2018-07-02 ENCOUNTER — Other Ambulatory Visit: Payer: Self-pay | Admitting: Internal Medicine

## 2018-07-03 NOTE — Assessment & Plan Note (Signed)
He continues to benefit from CPAP with improved sleep.  Download confirms excellent compliance and control  plan-continue CPAP auto

## 2018-07-03 NOTE — Assessment & Plan Note (Signed)
Try changing from DuoNeb nebulizer solution to lev albuterol in hopes of reducing tremor when neb treatments are needed.

## 2018-07-10 ENCOUNTER — Encounter: Payer: Self-pay | Admitting: General Surgery

## 2018-07-10 ENCOUNTER — Other Ambulatory Visit: Payer: Self-pay | Admitting: General Surgery

## 2018-07-16 NOTE — Pre-Procedure Instructions (Signed)
Meliton Samad [id number]  07/16/2018      [facility] [place], [place] - [address] [place] [address] Phone: [phone] Fax: [phone]  [facility] - [place], [place] - [address] AT [place] [address] [place] [address] Phone: [phone] Fax: [phone]  [facility] [place], [place] AT [facility] [address] [place] [address] Phone: [phone] Fax: [phone]    Your procedure is scheduled on March 10th.  Report to [facility] Entrance "A" Admitting at 7:30 A.M.  Call this number if you have problems the morning of surgery:  [phone]   Remember:  Do not eat or drink after midnight.  You may drink clear liquids until 6:30 AM .  Clear liquids allowed are:   Water, Juice (non-citric and without pulp), Carbonated beverages, Clear Tea, Black Coffee only, Plain Jell-O only and Gatorade    Take these medicines the morning of surgery with A SIP OF WATER   Tylenol - if needed  Xanax  Amlodipine (Norvasc)  Gabapentin (Neurontin)  Levalbuterol Nebulizer Solution - if needed  Prilosec  Proair Inhaler - if needed (bring with you)  Nasal Spray - if needed  Trelegy Ellipta Inhaler - if needed   Follow your surgeon's instructions on when to stop Asprin.  If no instructions were given by your surgeon then you will need to call the office to get those instructions.    7 days prior to surgery STOP taking any Aleve, Naproxen, Ibuprofen, Motrin, Advil, Goody's, BC's, all herbal medications, fish oil, and all vitamins.    Do not wear jewelry.  Do not wear lotions, powders, colognes, or deodorant.  Men may shave face and neck.  Do not bring valuables to the hospital.  [facility] is not responsible for any belongings or valuables.   [facility]- Preparing For Surgery  Before surgery, you can play an important role. Because skin  is not sterile, your skin needs to be as free of germs as possible. You can reduce the number of germs on your skin by washing with CHG (chlorahexidine gluconate) Soap before surgery.  CHG is an antiseptic cleaner which kills germs and bonds with the skin to continue killing germs even after washing.    Oral Hygiene is also important to reduce your risk of infection.  Remember - BRUSH YOUR TEETH THE MORNING OF SURGERY WITH YOUR REGULAR TOOTHPASTE  Please do not use if you have an allergy to CHG or antibacterial soaps. If your skin becomes reddened/irritated stop using the CHG.  Do not shave (including legs and underarms) for at least 48 hours prior to first CHG shower. It is OK to shave your face.  Please follow these instructions carefully.   1. Shower the NIGHT BEFORE SURGERY and the MORNING OF SURGERY with CHG.   2. If you chose to wash your hair, wash your hair first as usual with your normal shampoo.  3. After you shampoo, rinse your hair and body thoroughly to remove the shampoo.  4. Use CHG as you would any other liquid soap. You can apply CHG directly to the skin and wash gently with a scrungie or a clean washcloth.   5. Apply the CHG Soap to your body ONLY FROM THE NECK DOWN.  Do not use on open wounds or open sores. Avoid contact with your eyes, ears, mouth and genitals (private  parts). Wash Face and genitals (private parts)  with your normal soap.  6. Wash thoroughly, paying special attention to the area where your surgery will be performed.  7. Thoroughly rinse your body with warm water from the neck down.  8. DO NOT shower/wash with your normal soap after using and rinsing off the CHG Soap.  9. Pat yourself dry with a CLEAN TOWEL.  10. Wear CLEAN PAJAMAS to bed the night before surgery, wear comfortable clothes the morning of surgery  11. Place CLEAN SHEETS on your bed the night of your first shower and DO NOT SLEEP WITH PETS.   Day of Surgery:  Do not apply any  deodorants/lotions.  Please wear clean clothes to the hospital/surgery center.   Remember to brush your teeth WITH YOUR REGULAR TOOTHPASTE.   Contacts, dentures or bridgework may not be worn into surgery.  Leave your suitcase in the car.  After surgery it may be brought to your room.  For patients admitted to the hospital, discharge time will be determined by your treatment team.  Patients discharged the day of surgery will not be allowed to drive home.   Please read over the following fact sheets that you were given. Coughing and Deep Breathing and Surgical Site Infection Prevention

## 2018-07-17 ENCOUNTER — Other Ambulatory Visit: Payer: Self-pay

## 2018-07-17 ENCOUNTER — Encounter (HOSPITAL_COMMUNITY): Payer: Self-pay

## 2018-07-17 ENCOUNTER — Encounter (HOSPITAL_COMMUNITY)
Admission: RE | Admit: 2018-07-17 | Discharge: 2018-07-17 | Disposition: A | Discharge status: Home / Self Care | Payer: Medicare Other | Source: Ambulatory Visit | Attending: General Surgery | Admitting: General Surgery

## 2018-07-17 DIAGNOSIS — Z01818 Encounter for other preprocedural examination: Secondary | ICD-10-CM | POA: Diagnosis not present

## 2018-07-17 HISTORY — DX: Chronic obstructive pulmonary disease, unspecified: J44.9

## 2018-07-17 HISTORY — DX: Dyspnea, unspecified: R06.00

## 2018-07-17 HISTORY — DX: Gastro-esophageal reflux disease without esophagitis: K21.9

## 2018-07-17 LAB — COMPREHENSIVE METABOLIC PANEL
ALT: 14 U/L (ref 0–44)
AST: 19 U/L (ref 15–41)
Albumin: 3.4 g/dL — ABNORMAL LOW (ref 3.5–5.0)
Alkaline Phosphatase: 85 U/L (ref 38–126)
Anion gap: 7 (ref 5–15)
BUN: 8 mg/dL (ref 8–23)
CHLORIDE: 105 mmol/L (ref 98–111)
CO2: 25 mmol/L (ref 22–32)
Calcium: 9 mg/dL (ref 8.9–10.3)
Creatinine, Ser: 1.22 mg/dL (ref 0.61–1.24)
GFR calc Af Amer: >60 mL/min (ref >60)
GFR, EST NON AFRICAN AMERICAN: 56 mL/min — AB (ref >60)
Glucose, Bld: 112 mg/dL — ABNORMAL HIGH (ref 70–99)
Potassium: 4.3 mmol/L (ref 3.5–5.1)
Sodium: 137 mmol/L (ref 135–145)
Total Bilirubin: 0.6 mg/dL (ref 0.3–1.2)
Total Protein: 6 g/dL — ABNORMAL LOW (ref 6.5–8.1)

## 2018-07-17 LAB — CBC WITH DIFFERENTIAL/PLATELET
Abs Immature Granulocytes: 0.03 10*3/uL (ref 0.00–0.07)
Basophils Absolute: 0.1 10*3/uL (ref 0.0–0.1)
Basophils Relative: 1 %
Eosinophils Absolute: 0.3 10*3/uL (ref 0.0–0.5)
Eosinophils Relative: 4 %
HCT: 41.2 % (ref 39.0–52.0)
Hemoglobin: 12.7 g/dL — ABNORMAL LOW (ref 13.0–17.0)
Immature Granulocytes: 0 %
Lymphocytes Relative: 17 %
Lymphs Abs: 1.2 10*3/uL (ref 0.7–4.0)
MCH: 29.2 pg (ref 26.0–34.0)
MCHC: 30.8 g/dL (ref 30.0–36.0)
MCV: 94.7 fL (ref 80.0–100.0)
Monocytes Absolute: 0.6 10*3/uL (ref 0.1–1.0)
Monocytes Relative: 8 %
Neutro Abs: 5.3 10*3/uL (ref 1.7–7.7)
Neutrophils Relative %: 70 %
PLATELETS: 286 10*3/uL (ref 150–400)
RBC: 4.35 MIL/uL (ref 4.22–5.81)
RDW: 14.3 % (ref 11.5–15.5)
WBC: 7.5 10*3/uL (ref 4.0–10.5)
nRBC: 0 % (ref 0.0–0.2)

## 2018-07-17 LAB — URINALYSIS, ROUTINE W REFLEX MICROSCOPIC
Bilirubin Urine: NEGATIVE
Glucose, UA: NEGATIVE mg/dL
Hgb urine dipstick: NEGATIVE
Ketones, ur: NEGATIVE mg/dL
Leukocytes,Ua: NEGATIVE
Nitrite: NEGATIVE
Protein, ur: NEGATIVE mg/dL
Specific Gravity, Urine: 1.012 (ref 1.005–1.030)
pH: 5 (ref 5.0–8.0)

## 2018-07-17 LAB — HEMOGLOBIN A1C
Hgb A1c MFr Bld: 6.2 % — ABNORMAL HIGH (ref 4.8–5.6)
Mean Plasma Glucose: 131.24 mg/dL

## 2018-07-17 NOTE — Progress Notes (Signed)
PCP - [facility] Cardiologist - pt can't remember name [facility] Surgeon - Scot Dock  Chest x-ray - N/A EKG - 07-17-18  DM - prediabetic SA - yes, wears CPAP  Aspirin Instructions: Follow surgeon's instructions on when to stop taking ASA 81mg   Anesthesia review: yes   Patient denies fever, cough and chest pain at PAT appointment Pt states he has COPD and has SOB upon exertion.  Not a new symptom.  Has had symptoms for years.   Patient verbalized understanding of instructions that were given to them at the PAT appointment. Patient was also instructed that they will need to review over the PAT instructions again at home before surgery.

## 2018-07-18 LAB — CEA: CEA: 1.8 ng/mL (ref 0.0–4.7)

## 2018-07-18 NOTE — H&P (Signed)
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 His wife is with him throughout the encounter. His PCP is Dr.Koirala. He was referred by Dr. Laurence Spates because of a partially obstructing mass of his distal descending colon.      For the past 4 months his bowel movements have changed. They were normal caliber and now they are very small caliber. He had a little bit of blood from his hemorrhoids wants but really bleeding has not been a problem. He feels a little bloated. Denies pain nausea or vomiting. 7 pound weight loss in 4 months. Dr. Oletta Lamas put him on a low fiber diet and MiraLAX every other day and he feels better. He is having 3-4 stools a day now.      Upper endoscopy showed Barrett's change without dysplasia. H. pylori negative. Gastric polyps.       Colonoscopy showed a mass in the proximal sigmoid colon. Softer than a tumor. Could not pass this area. Biopsies of this area showed necrotic tissue and ulceration.       CT scan shows severe descending and sigmoid diverticulosis. There is 8.5 cm area of focal dilatation of the mid descending colon appears to contain stool although there may be a focal intussusception and maybe an endoluminal mass. No overt exophytic mass. No adenopathy. No liver mass. No ascites. Proximal colon is not really dilated but has lots of stool in it. Lab work on April 03, 2018 showed hemoglobin 12.4. A1c 6.5. Iron studies normal.        Past history significant for bilateral carotid stenosis, noncritical, 65%. Followed by Dr.Tat of neurology and Dr. Shanon Rosser of vascular surgery. Asymptomatic. OSA with CPAP. Anal polyps and hemorrhoids surgery by me 2012. Hypertension. Hyperlipidemia. Neuropathy. Prediabetes. Family  history reveals father died of suicide. Had heart disease and alcohol abuse. Mother died of breast cancer Physical history reveals he is married. His wife has a daughter by a prior marriage. Retired Music therapist at [employer]. Has a alcoholic beverage every other day. Quit smoking more than 10 years ago.         We had a long discussion. I told this might be malignant and it might not be. I told him there was a partial obstruction, obviously. We went over the differential diagnosis. I drew pictures of the location of the mass. I described his severe diverticulosis. Told him that what we need to do is bring down the splenic flexure, resect a segment of colon down to the mid to distal sigmoid and perform an anastomosis. If the obstruction is severe might have to have a colostomy. If is mild to moderate we might do a primary anastomosis with a temporary diverting loop ileostomy. I told him the technical factors would have to be perfect to do a primary anastomosis. He understands all of this He'll be scheduled for laparoscopic versus open left colectomy, possible ostomy. I discussed the indications, details, techniques, numerous risk of the surgery with him and his wife. He is aware of the risk of bleeding, infection, wound healing problems such as infection hernia or dehiscence, injury to adjacent organs such as the left ureter or major vascular structures requiring major repair, superficial or deep space infection, anastomotic leak requiring reoperation,  cardiac, neurologic , pulmonary and thromboembolic problems. He and his wife understand all these issues. All other questions were answered. They both agree with this plan.  Plan: Schedule laparoscopic versus open left colectomy with possible ostomy urgently Increased MiraLAX to once daily take MiraLAX twice daily for 1 week prior to the surgery Mechanical and antibiotic bowel prep according to protocol Take a 1 mile walk  daily to be build his strength   Colon education handouts given Colorectal surgery patient information booklet given Mechanical and antibiotic bowel prep instructions given and orders called in to pharmacy Preoperative ERAS orders entered Posting sheet completed    Past Surgical History  Anal Fissure Repair  Colon Polyp Removal - Colonoscopy  Tonsillectomy   Diagnostic Studies History Colonoscopy  within last year  Allergies  Aspirin *ANALGESICS - NonNarcotic*  NSAIDs  Allergies Reconciled   Medication History  amLODIPine Besylate (10MG  Tablet, Oral) Active. Atorvastatin Calcium (40MG  Tablet, Oral) Active. Trelegy Ellipta (100-62.5-25MCG/INH Aero Pow Br Act, Inhalation) Active. Sertraline HCl (100MG  Tablet, Oral) Active. Omeprazole (20MG  Capsule DR, Oral) Active. hydroCHLOROthiazide (12.5MG  Capsule, Oral) Active. Gabapentin (100MG  Capsule, Oral) Active. ALPRAZolam (0.25MG  Tablet, Oral) Active. Medications Reconciled  Social History  Alcohol use  Moderate alcohol use. Caffeine use  Carbonated beverages, Tea. No drug use  Tobacco use  Former smoker.  Family History  Alcohol Abuse  Father. Breast Cancer  Mother. Depression  Sister. Heart Disease  Father. Hypertension  Mother.  Other Problems  Anxiety Disorder  Arthritis  Back Pain  Chronic Obstructive Lung Disease  Depression  Diverticulosis  Gastric Ulcer  Gastroesophageal Reflux Disease  Hemorrhoids  High blood pressure  Hypercholesterolemia  Kidney Stone  Sleep Apnea  Umbilical Hernia Repair  Vascular Disease     Review of Systems  General Present- Appetite Loss. Not Present- Chills, Fatigue, Fever, Night Sweats, Weight Gain and Weight Loss. Skin Present- Hives. Not Present- Change in Wart/Mole, Dryness, Jaundice, New Lesions, Non-Healing Wounds, Rash and Ulcer. HEENT Present- Ringing in the Ears, Seasonal Allergies and Wears glasses/contact lenses. Not  Present- Earache, Hearing Loss, Hoarseness, Nose Bleed, Oral Ulcers, Sinus Pain, Sore Throat, Visual Disturbances and Yellow Eyes. Respiratory Present- Difficulty Breathing, Snoring and Wheezing. Not Present- Bloody sputum and Chronic Cough. Breast Not Present- Breast Mass, Breast Pain, Nipple Discharge and Skin Changes. Cardiovascular Present- Leg Cramps, Shortness of Breath and Swelling of Extremities. Not Present- Chest Pain, Difficulty Breathing Lying Down, Palpitations and Rapid Heart Rate. Gastrointestinal Present- Change in Bowel Habits, Excessive gas and Hemorrhoids. Not Present- Abdominal Pain, Bloating, Bloody Stool, Chronic diarrhea, Constipation, Difficulty Swallowing, Gets full quickly at meals, Indigestion, Nausea, Rectal Pain and Vomiting. Male Genitourinary Present- Impotence. Not Present- Blood in Urine, Change in Urinary Stream, Frequency, Nocturia, Painful Urination, Urgency and Urine Leakage. Musculoskeletal Present- Back Pain, Joint Pain, Joint Stiffness, Muscle Pain, Muscle Weakness and Swelling of Extremities. Neurological Present- Decreased Memory and Tremor. Not Present- Fainting, Headaches, Numbness, Seizures, Tingling, Trouble walking and Weakness. Psychiatric Present- Anxiety and Depression. Not Present- Bipolar, Change in Sleep Pattern, Fearful and Frequent crying. Endocrine Present- Cold Intolerance and Heat Intolerance. Not Present- Excessive Hunger, Hair Changes, Hot flashes and New Diabetes. Hematology Present- Easy Bruising. Not Present- Blood Thinners, Excessive bleeding, Gland problems, HIV and Persistent Infections.  Vitals Weight: 204.6 lb Height: 67in Body Surface Area: 2.04 m Body Mass Index: 32.04 kg/m  Temp.: 96.93F(Temporal)  Pulse: 103 (Regular)  P.OX: 96% (Room air) BP: 138/68 (Sitting, Left Arm, Standard)       Physical Exam  General Mental Status-Alert. General Appearance-Consistent with stated age. Hydration-Well  hydrated. Voice-Normal.  Head and Neck Head-normocephalic, atraumatic with no lesions or palpable masses. Trachea-midline. Thyroid Gland Characteristics - normal size and consistency.  Eye Eyeball - Bilateral-Extraocular movements intact. Sclera/Conjunctiva - Bilateral-No scleral icterus.  Chest and Lung Exam Chest and lung exam reveals -quiet, even and easy respiratory effort with no use of accessory muscles and on auscultation, normal breath sounds, no adventitious sounds and normal vocal resonance. Inspection Chest Wall - Normal. Back - normal.  Cardiovascular Cardiovascular examination reveals -normal heart sounds, regular rate and rhythm with no murmurs and normal pedal pulses bilaterally.  Abdomen Inspection  Inspection of the abdomen reveals: Note: Reducible umbilical hernia. Hernia sac 3 cm. Defect 2 cm. Skin - Scar - no surgical scars. Palpation/Percussion Palpation and Percussion of the abdomen reveal - Soft, Non Tender, No Rebound tenderness, No Rigidity (guarding) and No hepatosplenomegaly. Auscultation Auscultation of the abdomen reveals - Bowel sounds normal. Note: The abdomen is protuberant but not tympanitic. Nontender. No organomegaly. I don't feel a mass especially in the left gutter.   Neurologic Neurologic evaluation reveals -alert and oriented x 3 with no impairment of recent or remote memory. Mental Status-Normal.  Musculoskeletal Normal Exam - Left-Upper Extremity Strength Normal and Lower Extremity Strength Normal. Normal Exam - Right-Upper Extremity Strength Normal and Lower Extremity Strength Normal.  Lymphatic Head & Neck  General Head & Neck Lymphatics: Bilateral - Description - Normal. Axillary  General Axillary Region: Bilateral - Description - Normal. Tenderness - Non Tender. Femoral & Inguinal  Generalized Femoral & Inguinal Lymphatics: Bilateral - Description - Normal. Tenderness - Non Tender.    Assessment &  Plan  MASS OF COLON (K63.89)     Over the past 4 months your bowel habits have changed due to gradual obstruction of your colon You have a large mass of your descending colon This might be a benign process such as diverticulosis It might be a cancer It might be something else Your colonoscopy shows high-grade partial blockage and Dr. Oletta Lamas could not get his scope past this area. His biopsy does not show cancer, just necrotic tissue. your CT scan shows the mass in the descending colon and fairly extensive diverticulosis but no high-grade blockage and no evidence of cancer elsewhere  Regardless of the diagnoses you're having obstructive symptoms and we have agreed to proceed with surgery in the near future  For now, stay on the low fiber diet Increase MiraLAX to once a day starting today For 7 days prior to the surgery take MiraLAX twice a day and cut your diet back to creamy liquids For 24 hours prior to surgery you will be on a clear liquid diet and take a bowel prep as described  We have discussed the indications, techniques, and risk of the surgery in detail. You are aware that you might have a temporary ostomy You and your wife agree with this plan  BMI 32.0-32.9,ADULT (Z68.32) CAROTID STENOSIS, ASYMPTOMATIC, BILATERAL (I65.23) Impression: Noncritical stenosis. Followed by Shanon Rosser and Dr. Carles Collet. OSA ON CPAP (G47.33) HYPERTENSION, ESSENTIAL, BENIGN (I10) PREDIABETES (I45.80) UMBILICAL HERNIA WITHOUT OBSTRUCTION OR GANGRENE (K42.9) FORMER SMOKER, STOPPED SMOKING IN DISTANT PAST (D98.338)    Edsel Petrin. Dalbert Batman, M.D., [facility] General and Minimally invasive Surgery Breast and Colorectal Surgery Office:   [phone] Pager:   [phone]

## 2018-07-20 ENCOUNTER — Ambulatory Visit: Payer: Self-pay | Admitting: General Surgery

## 2018-07-20 MED ORDER — BUPIVACAINE LIPOSOME 1.3 % IJ SUSP
20.0000 mL | Freq: Once | INTRAMUSCULAR | Status: DC
  Filled 2018-07-20: qty 20

## 2018-07-20 NOTE — Anesthesia Preprocedure Evaluation (Addendum)
Anesthesia Evaluation  Patient identified by MRN, date of birth, ID band Patient awake    Reviewed: Allergy & Precautions, NPO status , Patient's Chart, lab work & pertinent test results  Airway Mallampati: II  TM Distance: >3 FB Neck ROM: Full    Dental  (+) Teeth Intact, Dental Advisory Given   Pulmonary asthma , sleep apnea , COPD,  COPD inhaler, former smoker,    Pulmonary exam normal breath sounds clear to auscultation       Cardiovascular hypertension, Pt. on medications Normal cardiovascular exam Rhythm:Regular Rate:Normal     Neuro/Psych PSYCHIATRIC DISORDERS Anxiety Depression negative neurological ROS     GI/Hepatic Neg liver ROS, PUD, GERD  Medicated,Left colon mass   Endo/Other  negative endocrine ROS  Renal/GU negative Renal ROS     Musculoskeletal negative musculoskeletal ROS (+)   Abdominal   Peds  Hematology  (+) Blood dyscrasia, anemia ,   Anesthesia Other Findings Day of surgery medications reviewed with the patient.  Reproductive/Obstetrics                            Anesthesia Physical Anesthesia Plan  ASA: III  Anesthesia Plan: General   Post-op Pain Management:  Regional for Post-op pain   Induction: Intravenous  PONV Risk Score and Plan: 4 or greater and Dexamethasone, Ondansetron and Treatment may vary due to age or medical condition  Airway Management Planned: Oral ETT  Additional Equipment:   Intra-op Plan:   Post-operative Plan: Possible Post-op intubation/ventilation  Informed Consent: I have reviewed the patients History and Physical, chart, labs and discussed the procedure including the risks, benefits and alternatives for the proposed anesthesia with the patient or authorized representative who has indicated his/her understanding and acceptance.     Dental advisory given  Plan Discussed with: CRNA  Anesthesia Plan Comments: (Sees Dr.  Scot Dock for asymptomatic 60 to 79% right carotid stenosis. Last seen 12/19, recommended 6 mo followup.  Sees Dr. Annamaria Boots for COPD and OSA on CPAP. Walk Test 09/08/2017-3 laps/ 555 feet, lowest saturation 90%, no need for supplemental oxygen.  )      Anesthesia Quick Evaluation

## 2018-07-21 ENCOUNTER — Other Ambulatory Visit: Payer: Self-pay

## 2018-07-21 ENCOUNTER — Encounter (HOSPITAL_COMMUNITY): Payer: Self-pay | Admitting: *Deleted

## 2018-07-21 ENCOUNTER — Encounter (HOSPITAL_COMMUNITY)
Admission: RE | Disposition: A | Discharge status: Home / Self Care | Payer: Self-pay | Source: Home / Self Care | Attending: General Surgery

## 2018-07-21 ENCOUNTER — Inpatient Hospital Stay (HOSPITAL_COMMUNITY): Payer: Medicare Other | Admitting: Physician Assistant

## 2018-07-21 ENCOUNTER — Inpatient Hospital Stay (HOSPITAL_COMMUNITY): Payer: Medicare Other | Admitting: Anesthesiology

## 2018-07-21 ENCOUNTER — Inpatient Hospital Stay (HOSPITAL_COMMUNITY)
Admission: RE | Admit: 2018-07-21 | Discharge: 2018-07-25 | DRG: 330 | Disposition: A | Discharge status: Home / Self Care | Payer: Medicare Other | Attending: General Surgery | Admitting: General Surgery

## 2018-07-21 DIAGNOSIS — Z8249 Family history of ischemic heart disease and other diseases of the circulatory system: Secondary | ICD-10-CM | POA: Diagnosis not present

## 2018-07-21 DIAGNOSIS — G4733 Obstructive sleep apnea (adult) (pediatric): Secondary | ICD-10-CM | POA: Diagnosis present

## 2018-07-21 DIAGNOSIS — K561 Intussusception: Secondary | ICD-10-CM | POA: Diagnosis present

## 2018-07-21 DIAGNOSIS — K219 Gastro-esophageal reflux disease without esophagitis: Secondary | ICD-10-CM | POA: Diagnosis present

## 2018-07-21 DIAGNOSIS — Z818 Family history of other mental and behavioral disorders: Secondary | ICD-10-CM | POA: Diagnosis not present

## 2018-07-21 DIAGNOSIS — Z811 Family history of alcohol abuse and dependence: Secondary | ICD-10-CM | POA: Diagnosis not present

## 2018-07-21 DIAGNOSIS — K5730 Diverticulosis of large intestine without perforation or abscess without bleeding: Secondary | ICD-10-CM | POA: Diagnosis present

## 2018-07-21 DIAGNOSIS — C801 Malignant (primary) neoplasm, unspecified: Secondary | ICD-10-CM

## 2018-07-21 DIAGNOSIS — K6389 Other specified diseases of intestine: Secondary | ICD-10-CM

## 2018-07-21 DIAGNOSIS — K420 Umbilical hernia with obstruction, without gangrene: Secondary | ICD-10-CM | POA: Diagnosis present

## 2018-07-21 DIAGNOSIS — C186 Malignant neoplasm of descending colon: Secondary | ICD-10-CM | POA: Diagnosis present

## 2018-07-21 DIAGNOSIS — J449 Chronic obstructive pulmonary disease, unspecified: Secondary | ICD-10-CM | POA: Diagnosis present

## 2018-07-21 DIAGNOSIS — Z79899 Other long term (current) drug therapy: Secondary | ICD-10-CM

## 2018-07-21 DIAGNOSIS — K56600 Partial intestinal obstruction, unspecified as to cause: Secondary | ICD-10-CM | POA: Diagnosis present

## 2018-07-21 DIAGNOSIS — Z803 Family history of malignant neoplasm of breast: Secondary | ICD-10-CM

## 2018-07-21 DIAGNOSIS — I10 Essential (primary) hypertension: Secondary | ICD-10-CM | POA: Diagnosis present

## 2018-07-21 HISTORY — PX: LAPAROSCOPIC LYSIS OF ADHESIONS: SHX5905

## 2018-07-21 HISTORY — PX: LAPAROSCOPIC PARTIAL COLECTOMY: SHX5907

## 2018-07-21 HISTORY — DX: Malignant (primary) neoplasm, unspecified: C80.1

## 2018-07-21 HISTORY — PX: UMBILICAL HERNIA REPAIR: SHX196

## 2018-07-21 LAB — CBC
HCT: 39.3 % (ref 39.0–52.0)
Hemoglobin: 12.7 g/dL — ABNORMAL LOW (ref 13.0–17.0)
MCH: 30.1 pg (ref 26.0–34.0)
MCHC: 32.3 g/dL (ref 30.0–36.0)
MCV: 93.1 fL (ref 80.0–100.0)
PLATELETS: 315 10*3/uL (ref 150–400)
RBC: 4.22 MIL/uL (ref 4.22–5.81)
RDW: 14.2 % (ref 11.5–15.5)
WBC: 13.3 10*3/uL — ABNORMAL HIGH (ref 4.0–10.5)
nRBC: 0 % (ref 0.0–0.2)

## 2018-07-21 LAB — CREATININE, SERUM
Creatinine, Ser: 1.45 mg/dL — ABNORMAL HIGH (ref 0.61–1.24)
GFR calc Af Amer: 53 mL/min — ABNORMAL LOW (ref >60)
GFR calc non Af Amer: 46 mL/min — ABNORMAL LOW (ref >60)

## 2018-07-21 LAB — TYPE AND SCREEN
ABO/RH(D): A POS
Antibody Screen: NEGATIVE

## 2018-07-21 LAB — ABO/RH: ABO/RH(D): A POS

## 2018-07-21 SURGERY — LAPAROSCOPIC PARTIAL COLECTOMY
Anesthesia: General | Site: Abdomen

## 2018-07-21 MED ORDER — ALVIMOPAN 12 MG PO CAPS
12.0000 mg | ORAL_CAPSULE | ORAL | Status: AC
  Administered 2018-07-21: 12 mg via ORAL
  Filled 2018-07-21: qty 1

## 2018-07-21 MED ORDER — GABAPENTIN 300 MG PO CAPS
300.0000 mg | ORAL_CAPSULE | Freq: Two times a day (BID) | ORAL | Status: DC
  Administered 2018-07-21 – 2018-07-25 (×8): 300 mg via ORAL
  Filled 2018-07-21 (×8): qty 1

## 2018-07-21 MED ORDER — CELECOXIB 200 MG PO CAPS
200.0000 mg | ORAL_CAPSULE | Freq: Two times a day (BID) | ORAL | Status: DC
  Administered 2018-07-21 – 2018-07-22 (×3): 200 mg via ORAL
  Filled 2018-07-21 (×3): qty 1

## 2018-07-21 MED ORDER — CEFOTETAN DISODIUM-DEXTROSE 2-2.08 GM-%(50ML) IV SOLR
INTRAVENOUS | Status: AC
  Filled 2018-07-21: qty 50

## 2018-07-21 MED ORDER — SACCHAROMYCES BOULARDII 250 MG PO CAPS
250.0000 mg | ORAL_CAPSULE | Freq: Two times a day (BID) | ORAL | Status: DC
  Administered 2018-07-21: 250 mg via ORAL
  Filled 2018-07-21: qty 1

## 2018-07-21 MED ORDER — SODIUM CHLORIDE 0.9 % IV SOLN
INTRAVENOUS | Status: DC | PRN
  Administered 2018-07-21: 2 g via INTRAVENOUS

## 2018-07-21 MED ORDER — ONDANSETRON HCL 4 MG/2ML IJ SOLN
INTRAMUSCULAR | Status: DC | PRN
  Administered 2018-07-21: 4 mg via INTRAVENOUS

## 2018-07-21 MED ORDER — KETOROLAC TROMETHAMINE 15 MG/ML IJ SOLN
INTRAMUSCULAR | Status: AC
  Filled 2018-07-21: qty 1

## 2018-07-21 MED ORDER — SUGAMMADEX SODIUM 200 MG/2ML IV SOLN
INTRAVENOUS | Status: DC | PRN
  Administered 2018-07-21: 200 mg via INTRAVENOUS

## 2018-07-21 MED ORDER — ROCURONIUM BROMIDE 100 MG/10ML IV SOLN
INTRAVENOUS | Status: DC | PRN
  Administered 2018-07-21: 20 mg via INTRAVENOUS
  Administered 2018-07-21: 50 mg via INTRAVENOUS
  Administered 2018-07-21 (×2): 10 mg via INTRAVENOUS

## 2018-07-21 MED ORDER — FENTANYL CITRATE (PF) 250 MCG/5ML IJ SOLN
INTRAMUSCULAR | Status: DC | PRN
  Administered 2018-07-21 (×5): 50 ug via INTRAVENOUS

## 2018-07-21 MED ORDER — GLYCOPYRROLATE 0.2 MG/ML IJ SOLN
INTRAMUSCULAR | Status: DC | PRN
  Administered 2018-07-21: 0.2 mg via INTRAVENOUS

## 2018-07-21 MED ORDER — SODIUM CHLORIDE 0.9 % IV SOLN
INTRAVENOUS | Status: DC | PRN
  Administered 2018-07-21: 20 ug/min via INTRAVENOUS

## 2018-07-21 MED ORDER — LIDOCAINE 2% (20 MG/ML) 5 ML SYRINGE
INTRAMUSCULAR | Status: AC
  Filled 2018-07-21: qty 5

## 2018-07-21 MED ORDER — DEXAMETHASONE SODIUM PHOSPHATE 10 MG/ML IJ SOLN
INTRAMUSCULAR | Status: AC
  Filled 2018-07-21: qty 1

## 2018-07-21 MED ORDER — FENTANYL CITRATE (PF) 100 MCG/2ML IJ SOLN
INTRAMUSCULAR | Status: AC
  Administered 2018-07-21: 50 ug via INTRAVENOUS
  Filled 2018-07-21: qty 2

## 2018-07-21 MED ORDER — LIDOCAINE HCL (CARDIAC) PF 100 MG/5ML IV SOSY
PREFILLED_SYRINGE | INTRAVENOUS | Status: DC | PRN
  Administered 2018-07-21: 60 mg via INTRAVENOUS

## 2018-07-21 MED ORDER — CHLORHEXIDINE GLUCONATE 4 % EX LIQD: 60.0000 mL | Freq: Once | CUTANEOUS | Status: DC

## 2018-07-21 MED ORDER — BUPIVACAINE-EPINEPHRINE (PF) 0.25% -1:200000 IJ SOLN
INTRAMUSCULAR | Status: AC
  Filled 2018-07-21: qty 30

## 2018-07-21 MED ORDER — PROPOFOL 10 MG/ML IV BOLUS
INTRAVENOUS | Status: DC | PRN
  Administered 2018-07-21: 30 mg via INTRAVENOUS
  Administered 2018-07-21: 150 mg via INTRAVENOUS

## 2018-07-21 MED ORDER — ACETAMINOPHEN 500 MG PO TABS
1000.0000 mg | ORAL_TABLET | ORAL | Status: AC
  Administered 2018-07-21: 1000 mg via ORAL
  Filled 2018-07-21: qty 2

## 2018-07-21 MED ORDER — LACTATED RINGERS IV SOLN
Freq: Once | INTRAVENOUS | Status: AC
  Administered 2018-07-21: 09:00:00 via INTRAVENOUS

## 2018-07-21 MED ORDER — MIDAZOLAM HCL 2 MG/2ML IJ SOLN
1.0000 mg | Freq: Once | INTRAMUSCULAR | Status: AC
  Administered 2018-07-21: 1 mg via INTRAVENOUS

## 2018-07-21 MED ORDER — SODIUM CHLORIDE 0.9 % IV SOLN
2.0000 g | Freq: Two times a day (BID) | INTRAVENOUS | Status: AC
  Administered 2018-07-21: 2 g via INTRAVENOUS
  Filled 2018-07-21: qty 2

## 2018-07-21 MED ORDER — ROCURONIUM BROMIDE 50 MG/5ML IV SOSY
PREFILLED_SYRINGE | INTRAVENOUS | Status: AC
  Filled 2018-07-21: qty 5

## 2018-07-21 MED ORDER — GABAPENTIN 300 MG PO CAPS
ORAL_CAPSULE | ORAL | Status: AC
  Administered 2018-07-21: 300 mg via ORAL
  Filled 2018-07-21: qty 1

## 2018-07-21 MED ORDER — ONDANSETRON HCL 4 MG/2ML IJ SOLN: 4.0000 mg | Freq: Once | INTRAMUSCULAR | Status: DC | PRN

## 2018-07-21 MED ORDER — ALVIMOPAN 12 MG PO CAPS
12.0000 mg | ORAL_CAPSULE | Freq: Two times a day (BID) | ORAL | Status: DC
  Administered 2018-07-22 (×2): 12 mg via ORAL
  Filled 2018-07-21 (×3): qty 1

## 2018-07-21 MED ORDER — CEFOTETAN DISODIUM-DEXTROSE 2-2.08 GM-%(50ML) IV SOLR: 2.0000 g | INTRAVENOUS | Status: DC

## 2018-07-21 MED ORDER — LACTATED RINGERS IV SOLN
INTRAVENOUS | Status: DC
  Administered 2018-07-21 – 2018-07-24 (×7): via INTRAVENOUS

## 2018-07-21 MED ORDER — FENTANYL CITRATE (PF) 100 MCG/2ML IJ SOLN
INTRAMUSCULAR | Status: AC
  Filled 2018-07-21: qty 2

## 2018-07-21 MED ORDER — FENTANYL CITRATE (PF) 100 MCG/2ML IJ SOLN
50.0000 ug | Freq: Once | INTRAMUSCULAR | Status: AC
  Administered 2018-07-21: 50 ug via INTRAVENOUS

## 2018-07-21 MED ORDER — LACTATED RINGERS IV SOLN
INTRAVENOUS | Status: DC | PRN
  Administered 2018-07-21: 08:00:00 via INTRAVENOUS

## 2018-07-21 MED ORDER — GABAPENTIN 300 MG PO CAPS
300.0000 mg | ORAL_CAPSULE | ORAL | Status: AC
  Administered 2018-07-21: 300 mg via ORAL

## 2018-07-21 MED ORDER — HYDROMORPHONE HCL 1 MG/ML IJ SOLN
1.0000 mg | INTRAMUSCULAR | Status: DC | PRN
  Administered 2018-07-21 – 2018-07-22 (×4): 1 mg via INTRAVENOUS
  Filled 2018-07-21 (×4): qty 1

## 2018-07-21 MED ORDER — FENTANYL CITRATE (PF) 100 MCG/2ML IJ SOLN
25.0000 ug | INTRAMUSCULAR | Status: DC | PRN
  Administered 2018-07-21 (×2): 50 ug via INTRAVENOUS

## 2018-07-21 MED ORDER — MIDAZOLAM HCL 2 MG/2ML IJ SOLN
INTRAMUSCULAR | Status: AC
  Administered 2018-07-21: 1 mg via INTRAVENOUS
  Filled 2018-07-21: qty 2

## 2018-07-21 MED ORDER — BUPIVACAINE LIPOSOME 1.3 % IJ SUSP
INTRAMUSCULAR | Status: DC | PRN
  Administered 2018-07-21 (×2): 10 mL via PERINEURAL

## 2018-07-21 MED ORDER — ENSURE SURGERY PO LIQD
237.0000 mL | Freq: Two times a day (BID) | ORAL | Status: DC
  Administered 2018-07-24 – 2018-07-25 (×3): 237 mL via ORAL
  Filled 2018-07-21 (×8): qty 237

## 2018-07-21 MED ORDER — HYDROCODONE-ACETAMINOPHEN 5-325 MG PO TABS
ORAL_TABLET | ORAL | Status: AC
  Filled 2018-07-21: qty 2

## 2018-07-21 MED ORDER — BUPIVACAINE-EPINEPHRINE (PF) 0.25% -1:200000 IJ SOLN
INTRAMUSCULAR | Status: DC | PRN
  Administered 2018-07-21 (×2): 20 mL via PERINEURAL

## 2018-07-21 MED ORDER — 0.9 % SODIUM CHLORIDE (POUR BTL) OPTIME
TOPICAL | Status: DC | PRN
  Administered 2018-07-21 (×3): 1000 mL

## 2018-07-21 MED ORDER — SODIUM CHLORIDE 0.9 % IR SOLN
Status: DC | PRN
  Administered 2018-07-21: 1

## 2018-07-21 MED ORDER — LACTATED RINGERS IV SOLN
INTRAVENOUS | Status: DC | PRN
  Administered 2018-07-21: 10:00:00 via INTRAVENOUS

## 2018-07-21 MED ORDER — HYDROCODONE-ACETAMINOPHEN 5-325 MG PO TABS
1.0000 | ORAL_TABLET | ORAL | Status: DC | PRN
  Administered 2018-07-21 – 2018-07-25 (×5): 2 via ORAL
  Filled 2018-07-21 (×4): qty 2

## 2018-07-21 MED ORDER — DEXAMETHASONE SODIUM PHOSPHATE 10 MG/ML IJ SOLN
INTRAMUSCULAR | Status: DC | PRN
  Administered 2018-07-21: 10 mg via INTRAVENOUS

## 2018-07-21 MED ORDER — KETOROLAC TROMETHAMINE 15 MG/ML IJ SOLN
15.0000 mg | Freq: Four times a day (QID) | INTRAMUSCULAR | Status: DC | PRN
  Administered 2018-07-22: 15 mg via INTRAVENOUS
  Filled 2018-07-21 (×2): qty 1

## 2018-07-21 MED ORDER — PHENYLEPHRINE HCL 10 MG/ML IJ SOLN
INTRAMUSCULAR | Status: DC | PRN
  Administered 2018-07-21: 80 ug via INTRAVENOUS

## 2018-07-21 MED ORDER — ONDANSETRON HCL 4 MG/2ML IJ SOLN
INTRAMUSCULAR | Status: AC
  Filled 2018-07-21: qty 2

## 2018-07-21 MED ORDER — SODIUM CHLORIDE 0.9 % IV SOLN: 2.0000 g | INTRAVENOUS | Status: DC

## 2018-07-21 MED ORDER — ONDANSETRON HCL 4 MG/2ML IJ SOLN
4.0000 mg | Freq: Four times a day (QID) | INTRAMUSCULAR | Status: DC | PRN
  Administered 2018-07-22: 4 mg via INTRAVENOUS
  Filled 2018-07-21: qty 2

## 2018-07-21 MED ORDER — ONDANSETRON HCL 4 MG PO TABS: 4.0000 mg | ORAL_TABLET | Freq: Four times a day (QID) | ORAL | Status: DC | PRN

## 2018-07-21 MED ORDER — LACTATED RINGERS IV SOLN: INTRAVENOUS | Status: DC | PRN

## 2018-07-21 MED ORDER — ENOXAPARIN SODIUM 40 MG/0.4ML ~~LOC~~ SOLN
40.0000 mg | SUBCUTANEOUS | Status: DC
  Administered 2018-07-22 – 2018-07-25 (×4): 40 mg via SUBCUTANEOUS
  Filled 2018-07-21 (×4): qty 0.4

## 2018-07-21 MED ORDER — KETOROLAC TROMETHAMINE 15 MG/ML IJ SOLN
15.0000 mg | Freq: Four times a day (QID) | INTRAMUSCULAR | Status: AC
  Administered 2018-07-21: 15 mg via INTRAVENOUS

## 2018-07-21 MED ORDER — FENTANYL CITRATE (PF) 250 MCG/5ML IJ SOLN
INTRAMUSCULAR | Status: AC
  Filled 2018-07-21: qty 5

## 2018-07-21 SURGICAL SUPPLY — 89 items
APPLIER CLIP ROT 10 11.4 M/L (STAPLE)
BLADE CLIPPER SURG (BLADE) ×4 IMPLANT
CANISTER SUCT 3000ML PPV (MISCELLANEOUS) ×4 IMPLANT
CELLS DAT CNTRL 66122 CELL SVR (MISCELLANEOUS) IMPLANT
CHLORAPREP W/TINT 26ML (MISCELLANEOUS) ×4 IMPLANT
CLIP APPLIE ROT 10 11.4 M/L (STAPLE) IMPLANT
COUNTER NEEDLE 20 DBL MAG RED (NEEDLE) ×4 IMPLANT
COVER MAYO STAND STRL (DRAPES) ×4 IMPLANT
COVER SURGICAL LIGHT HANDLE (MISCELLANEOUS) ×8 IMPLANT
COVER WAND RF STERILE (DRAPES) IMPLANT
DRAPE HALF SHEET 40X57 (DRAPES) IMPLANT
DRAPE UTILITY XL STRL (DRAPES) ×8 IMPLANT
DRAPE WARM FLUID 44X44 (DRAPE) ×4 IMPLANT
DRSG OPSITE POSTOP 4X10 (GAUZE/BANDAGES/DRESSINGS) ×4 IMPLANT
DRSG OPSITE POSTOP 4X8 (GAUZE/BANDAGES/DRESSINGS) IMPLANT
DRSG TEGADERM 2-3/8X2-3/4 SM (GAUZE/BANDAGES/DRESSINGS) ×12 IMPLANT
ELECT BLADE 6.5 EXT (BLADE) ×8 IMPLANT
ELECT CAUTERY BLADE 6.4 (BLADE) ×8 IMPLANT
ELECT REM PT RETURN 9FT ADLT (ELECTROSURGICAL) ×4
ELECTRODE REM PT RTRN 9FT ADLT (ELECTROSURGICAL) ×2 IMPLANT
GAUZE SPONGE 2X2 8PLY STRL LF (GAUZE/BANDAGES/DRESSINGS) ×4 IMPLANT
GLOVE BIO SURGEON STRL SZ7.5 (GLOVE) ×4 IMPLANT
GLOVE BIOGEL PI IND STRL 6.5 (GLOVE) ×6 IMPLANT
GLOVE BIOGEL PI IND STRL 7.5 (GLOVE) ×4 IMPLANT
GLOVE BIOGEL PI IND STRL 8 (GLOVE) ×4 IMPLANT
GLOVE BIOGEL PI INDICATOR 6.5 (GLOVE) ×6
GLOVE BIOGEL PI INDICATOR 7.5 (GLOVE) ×4
GLOVE BIOGEL PI INDICATOR 8 (GLOVE) ×4
GLOVE EUDERMIC 7 POWDERFREE (GLOVE) ×20 IMPLANT
GLOVE SURG ORTHO 8.0 STRL STRW (GLOVE) ×8 IMPLANT
GLOVE SURG ORTHO 8.5 STRL (GLOVE) ×4 IMPLANT
GLOVE SURG SS PI 6.0 STRL IVOR (GLOVE) ×20 IMPLANT
GOWN STRL REUS W/ TWL LRG LVL3 (GOWN DISPOSABLE) ×12 IMPLANT
GOWN STRL REUS W/ TWL XL LVL3 (GOWN DISPOSABLE) ×4 IMPLANT
GOWN STRL REUS W/TWL LRG LVL3 (GOWN DISPOSABLE) ×12
GOWN STRL REUS W/TWL XL LVL3 (GOWN DISPOSABLE) ×4
KIT BASIN OR (CUSTOM PROCEDURE TRAY) ×4 IMPLANT
LEGGING LITHOTOMY PAIR STRL (DRAPES) ×4 IMPLANT
LIGASURE IMPACT 36 18CM CVD LR (INSTRUMENTS) ×4 IMPLANT
NEEDLE INSUFFLATION 14GA 120MM (NEEDLE) ×4 IMPLANT
NS IRRIG 1000ML POUR BTL (IV SOLUTION) ×12 IMPLANT
PAD ARMBOARD 7.5X6 YLW CONV (MISCELLANEOUS) ×8 IMPLANT
PENCIL BUTTON HOLSTER BLD 10FT (ELECTRODE) ×8 IMPLANT
PENCIL SMOKE EVAC W/HOLSTER (ELECTROSURGICAL) ×4 IMPLANT
RELOAD PROXIMATE 75MM BLUE (ENDOMECHANICALS) ×8 IMPLANT
RTRCTR WOUND ALEXIS 18CM MED (MISCELLANEOUS)
SCISSORS LAP 5X35 DISP (ENDOMECHANICALS) ×4 IMPLANT
SET IRRIG TUBING LAPAROSCOPIC (IRRIGATION / IRRIGATOR) ×4 IMPLANT
SHEARS HARMONIC ACE PLUS 36CM (ENDOMECHANICALS) ×4 IMPLANT
SLEEVE ENDOPATH XCEL 5M (ENDOMECHANICALS) ×16 IMPLANT
SLEEVE SUCTION 125 (MISCELLANEOUS) ×4 IMPLANT
SLEEVE SUCTION CATH 165 (SLEEVE) ×4 IMPLANT
SOL PREP POV-IOD 4OZ 10% (MISCELLANEOUS) ×4 IMPLANT
SPECIMEN JAR LARGE (MISCELLANEOUS) ×4 IMPLANT
SPONGE GAUZE 2X2 STER 10/PKG (GAUZE/BANDAGES/DRESSINGS) ×4
SPONGE LAP 18X18 RF (DISPOSABLE) ×20 IMPLANT
STAPLER GUN LINEAR PROX 60 (STAPLE) ×4 IMPLANT
STAPLER PROXIMATE 75MM BLUE (STAPLE) ×4 IMPLANT
STAPLER VISISTAT 35W (STAPLE) ×4 IMPLANT
SUCTION POOLE TIP (SUCTIONS) ×4 IMPLANT
SURGILUBE 2OZ TUBE FLIPTOP (MISCELLANEOUS) IMPLANT
SUT NOVA 1 T20/GS 25DT (SUTURE) ×8 IMPLANT
SUT PDS AB 1 TP1 96 (SUTURE) ×16 IMPLANT
SUT PROLENE 2 0 CT2 30 (SUTURE) IMPLANT
SUT PROLENE 2 0 KS (SUTURE) IMPLANT
SUT SILK 2 0 (SUTURE) ×2
SUT SILK 2 0 SH CR/8 (SUTURE) ×8 IMPLANT
SUT SILK 2-0 18XBRD TIE 12 (SUTURE) ×2 IMPLANT
SUT SILK 3 0 (SUTURE) ×2
SUT SILK 3 0 SH CR/8 (SUTURE) ×4 IMPLANT
SUT SILK 3-0 18XBRD TIE 12 (SUTURE) ×2 IMPLANT
SUT VIC AB 3-0 54X BRD REEL (SUTURE) ×2 IMPLANT
SUT VIC AB 3-0 BRD 54 (SUTURE) ×2
SYR BULB IRRIGATION 50ML (SYRINGE) ×4 IMPLANT
SYS LAPSCP GELPORT 120MM (MISCELLANEOUS)
SYSTEM LAPSCP GELPORT 120MM (MISCELLANEOUS) IMPLANT
TOWEL OR 17X26 10 PK STRL BLUE (TOWEL DISPOSABLE) ×4 IMPLANT
TRAY FOLEY MTR SLVR 16FR STAT (SET/KITS/TRAYS/PACK) ×4 IMPLANT
TRAY LAPAROSCOPIC MC (CUSTOM PROCEDURE TRAY) IMPLANT
TRAY PROCTOSCOPIC FIBER OPTIC (SET/KITS/TRAYS/PACK) IMPLANT
TROCAR XCEL 12X100 BLDLESS (ENDOMECHANICALS) IMPLANT
TROCAR XCEL BLUNT TIP 100MML (ENDOMECHANICALS) IMPLANT
TROCAR XCEL NON-BLD 11X100MML (ENDOMECHANICALS) IMPLANT
TROCAR XCEL NON-BLD 5MMX100MML (ENDOMECHANICALS) ×4 IMPLANT
TUBE CONNECTING 12'X1/4 (SUCTIONS) ×2
TUBE CONNECTING 12X1/4 (SUCTIONS) ×6 IMPLANT
TUBING INSUF HEATED (TUBING) IMPLANT
WATER STERILE IRR 1000ML POUR (IV SOLUTION) ×4 IMPLANT
YANKAUER SUCT BULB TIP NO VENT (SUCTIONS) ×8 IMPLANT

## 2018-07-21 NOTE — Consult Note (Signed)
[facility] Nurse requested for preoperative stoma site marking  Discussed surgical procedure and stoma creation with patient and family.  Explained role of the [facility] nurse team.  Provided the patient with educational booklet and provided samples of pouching options.   Examined patient lying, sitting in order to place the marking in the patient's visual field, away from any creases or abdominal contour issues and within the rectus muscle.  And to avoid large midline hernia   Marked for ileostomy in the RLQ  _5__cm to the right of the umbilicus   Patient's abdomen cleansed with CHG wipes at site markings, allowed to air dry prior to marking.Covered mark with thin film transparent dressing to preserve mark until date of surgery.   [facility] Nurse team will follow up with patient after surgery for continue ostomy care and teaching.  [place] MSN, [place], [place], [place]

## 2018-07-21 NOTE — Anesthesia Procedure Notes (Signed)
Anesthesia Regional Block: TAP block   Pre-Anesthetic Checklist: ,, timeout performed, Correct Patient, Correct Site, Correct Laterality, Correct Procedure, Correct Position, site marked, Risks and benefits discussed,  Surgical consent,  Pre-op evaluation,  At surgeon's request and post-op pain management  Laterality: Left  Prep: chloraprep       Needles:  Injection technique: Single-shot  Needle Type: Echogenic Needle     Needle Length: 9cm  Needle Gauge: 21     Additional Needles:   Procedures:,,,, ultrasound used (permanent image in chart),,,,  Narrative:  Start time: 07/21/2018 9:00 AM End time: 07/21/2018 9:05 AM Injection made incrementally with aspirations every 5 mL.  Performed by: Personally  Anesthesiologist: Catalina Gravel, MD  Additional Notes: No pain on injection. No increased resistance to injection. Injection made in 5cc increments.  Good needle visualization.  Patient tolerated procedure well.

## 2018-07-21 NOTE — Anesthesia Postprocedure Evaluation (Signed)
Anesthesia Post Note  Patient: Glen [name] FHLKTGY  Procedure(s) Performed: LAPARASCOPIC TAKEDOWN OF SPLENIC FLEXURE; OPEN LEFT COLETCTOMY WITH PRIMARY ANASTOMOSIS (Left Abdomen) PRIMARY REPAIR OF UMBILICAL HERNIA , ADULT (N/A Abdomen) Laparoscopic Lysis Of Adhesions (N/A Abdomen)     Patient location during evaluation: PACU Anesthesia Type: General Level of consciousness: awake and alert, awake and oriented Pain management: pain level controlled Vital Signs Assessment: post-procedure vital signs reviewed and stable Respiratory status: spontaneous breathing, nonlabored ventilation and respiratory function stable Cardiovascular status: blood pressure returned to baseline and stable Postop Assessment: no apparent nausea or vomiting Anesthetic complications: no    Last Vitals:  Vitals:   07/21/18 1359 07/21/18 1414  BP: (!) 142/77 138/71  Pulse: 75 74  Resp:    Temp:    SpO2: 97% 93%    Last Pain:  Vitals:   07/21/18 1400  TempSrc:   PainSc: 0-No pain                 Catalina Gravel

## 2018-07-21 NOTE — Progress Notes (Signed)
Patient is unaware of home settings on the NIV, settings were adjusted to patient comfort. Patient is stable at this time, on CPAP tolerating it well.

## 2018-07-21 NOTE — Anesthesia Procedure Notes (Signed)
Anesthesia Regional Block: TAP block   Pre-Anesthetic Checklist: ,, timeout performed, Correct Patient, Correct Site, Correct Laterality, Correct Procedure, Correct Position, site marked, Risks and benefits discussed,  Surgical consent,  Pre-op evaluation,  At surgeon's request and post-op pain management  Laterality: Right  Prep: chloraprep       Needles:  Injection technique: Single-shot  Needle Type: Echogenic Needle     Needle Length: 9cm  Needle Gauge: 21     Additional Needles:   Procedures:,,,, ultrasound used (permanent image in chart),,,,  Narrative:  Start time: 07/21/2018 9:05 AM End time: 07/21/2018 9:10 AM Injection made incrementally with aspirations every 5 mL.  Performed by: Personally  Anesthesiologist: Catalina Gravel, MD  Additional Notes: No pain on injection. No increased resistance to injection. Injection made in 5cc increments.  Good needle visualization.  Patient tolerated procedure well.

## 2018-07-21 NOTE — Transfer of Care (Signed)
Immediate Anesthesia Transfer of Care Note  Patient: Glen [name] VFMBBUY  Procedure(s) Performed: LAPAROSCOPIC POSSIBLE OPEN LEFT COLECTOMY POSSIBLE OSTOMY (Left )  Patient Location: PACU  Anesthesia Type:General  Level of Consciousness: awake, alert , oriented and patient cooperative  Airway & Oxygen Therapy: Patient Spontanous Breathing and Patient connected to nasal cannula oxygen  Post-op Assessment: Report given to RN and Post -op Vital signs reviewed and stable  Post vital signs: Reviewed and stable  Last Vitals:  Vitals Value Taken Time  BP 129/93 07/21/2018 12:56 PM  Temp 36.4 C 07/21/2018 12:55 PM  Pulse 78 07/21/2018 12:56 PM  Resp 20 07/21/2018 12:56 PM  SpO2 97 % 07/21/2018 12:56 PM  Vitals shown include unvalidated device data.  Last Pain:  Vitals:   07/21/18 0920  TempSrc:   PainSc: 0-No pain         Complications: No apparent anesthesia complications

## 2018-07-21 NOTE — Interval H&P Note (Signed)
History and Physical Interval Note:  07/21/2018 8:23 AM  Glen [name]  has presented today for surgery, with the diagnosis of left colon mass.  The various methods of treatment have been discussed with the patient and family. After consideration of risks, benefits and other options for treatment, the patient has consented to  Procedure(s): LAPAROSCOPIC POSSIBLE OPEN LEFT COLECTOMY POSSIBLE OSTOMY (Left) as a surgical intervention.  The patient's history has been reviewed, patient examined, no change in status, stable for surgery.  I have reviewed the patient's chart and labs.  Questions were answered to the patient's satisfaction.     Adin Hector

## 2018-07-21 NOTE — Anesthesia Procedure Notes (Signed)
Procedure Name: Intubation Date/Time: 07/21/2018 9:42 AM Performed by: Shirlyn Goltz, CRNA Pre-anesthesia Checklist: Patient identified, Emergency Drugs available, Suction available and Patient being monitored Patient Re-evaluated:Patient Re-evaluated prior to induction Oxygen Delivery Method: Circle system utilized Preoxygenation: Pre-oxygenation with 100% oxygen Induction Type: IV induction Ventilation: Mask ventilation without difficulty Laryngoscope Size: Mac and 4 Grade View: Grade II Tube type: Oral Tube size: 8.0 mm Number of attempts: 1 Airway Equipment and Method: Stylet Placement Confirmation: ETT inserted through vocal cords under direct vision,  positive ETCO2 and breath sounds checked- equal and bilateral Secured at: 20 cm Tube secured with: Tape Dental Injury: Teeth and Oropharynx as per pre-operative assessment

## 2018-07-21 NOTE — Op Note (Addendum)
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 His PCP is Dr.Koirala. He was referred by Dr. Laurence Spates because of a partially obstructing mass of his distal descending colon.      For the past 4 months his bowel movements have changed. They were normal caliber and now they are very small caliber.Glen [name] He feels a little bloated. Denies pain nausea or vomiting. 7 pound weight loss in 4 months. Dr. Oletta Lamas put him on a low fiber diet and MiraLAX every other day and he feels better. He is having 3-4 stools a day now.      Upper endoscopy showed Barrett's change without dysplasia. H. pylori negative. Gastric polyps.       Colonoscopy showed a mass in the proximal sigmoid colon. Softer than a tumor. Could not pass this area. Biopsies of this area showed necrotic tissue and ulceration.       CT scan shows  descending and sigmoid diverticulosis. There is 8.5 cm area of focal dilatation of the mid descending colon appears to contain stool although there may be a focal intussusception and maybe an endoluminal mass. No overt exophytic mass. No adenopathy. No liver mass. No ascites.  Proximal colon is not really dilated but has lots of stool in it. Lab work on April 03, 2018 showed hemoglobin 12.4. A1c 6.5. Iron studies normal.        Past history significant for bilateral carotid stenosis, noncritical, 65%. Followed by Dr.Tat of neurology and Dr. Shanon Rosser of vascular surgery. Asymptomatic. OSA with CPAP. Anal polyps and hemorrhoids surgery by me 2012. Hypertension. Hyperlipidemia. Neuropathy. Prediabetes.         We had a long discussion. I told this might be malignant and it might not be. I told him there was a partial obstruction, obviously. We went over the differential diagnosis. I drew pictures of the location of the mass. I described his severe diverticulosis. Told him that what we need to do is bring down the splenic flexure, resect a segment of colon down to the  mid to distal sigmoid and perform an anastomosis. If the obstruction is severe might have to have a colostomy. If is mild to moderate we might do a primary anastomosis with a temporary diverting loop ileostomy. I told him the technical factors would have to be perfect to do a primary anastomosis. He understands all of this He'll be scheduled for laparoscopic versus open left colectomy, possible ostomy.    Operative Findings:       There was a large firm mass in the mid descending colon.  The serosa was intact but there was a lot of tissue drawn into this and it did look like an intussusception.  There was no evidence of adenopathy or liver mass or cancer elsewhere.  We were able to mobilize it medially laparoscopically but there was very thickened intense involvement of the omentum around this mass.  Visualization of the splenic flexure was obscured.  We ultimately made an upper midline incision to mobilize the splenic flexure, resected the colon mass, and perform a primary anastomosis.  The remaining colon was quite pink and healthy.  The prep was good.  There was no tension on the  anastomosis.  Grossly the distal margin was at least 5 cm.  Proximally the margin was at least 15 or 20 cm.  We did not feel that he needed a diverting colostomy.  The distal limit of resection was marked with a silk suture.  The pathologist looked at this grossly and felt that it was a malignancy.      There was an incarcerated umbilical hernia containing omentum.  The omentum was dissected out of the umbilical hernia at the beginning of the case.  The hernia was repaired as part of the midline wound closure.  Procedure in Detail:         Following the induction of general endotracheal anesthesia a Foley catheter was placed.  The patient was placed in lithotomy and padded stirrups.  The abdomen was prepped and draped in a sterile fashion.  Intravenous antibiotics were given.  Surgical timeout was performed.      Bilateral TAPP blocks were performed preop by the anesthesiologist.      I placed 4 5 mm trochars.  The first 1 was through an optical trocar in the left upper quadrant.  Pneumoperitoneum was created.  Video camera was inserted.  There was no evidence of bleeding or bowel injury and the only adhesions to the anterior domino wall was the omentum which was incarcerated into his umbilical hernia.  We dissected the omentum out of the umbilical hernia and there was no bleeding.  We tried to lift the omentum up and found that the left side of the omentum was densely adherent to the colon in the region of the mass.  We mobilized the sigmoid colon by dividing its lateral peritoneal attachments and mobilizing it medially.  We took the dissection all the way up and mobilize the mass medially somewhat and took the dissection up to the splenic flexure.  At this point we could not mobilize the omentum to lift it so we could not see the distal transverse colon.   Upper midline incision was made.  The fascia was incised in the midline..  Self-retaining retaining retractor was placed.  We continue to mobilize the  sigmoid and descending colon medially.  We stayed well away from the left ureter.  We were not sure whether this was a benign or malignant process.  We divided the mid sigmoid colon about  5 to 7 cm distal to the mass using a GIA stapling device.  We slowly mobilized the splenic flexure by dividing the omental attachments.  We divided the omentum and the left transverse colon.  We divided the left transverse colon with a GIA stapling device.  This allowed [place] to continue to mobilize the colon and the omentum down.  The mesentery was taken back to its most posterior aspect.  Smaller vessels were controlled with the LigaSure device and larger vessels were clamped divided and ligated with 2-0 silk ties.      The distal margin was marked with a silk suture.  The specimen was examined by pathology and they felt this looked like a malignancy.     We were able to fully mobilize the mid and distal sigmoid colon up and the transverse colon down to where there was no tension whatsoever.  The colon was pink and healthy.  We set up a side to side, functional end-to-end anastomosis.  The anastomosis was set up with silk sutures.  Enterotomies were made.  Anastomosis was created with a GIA-75 stapling device.  The common defect was closed with a TA 60 stapling device.  We placed silk sutures at critical points along the staple lines.  Anastomosis was larger than 2 fingers.  There was no tension.  The prep was actually quite good.  We chose not to do a proximal diversion.  I chose not to close the mesentery as it lay very well in the left paracolic gutter.  We copiously irrigated the abdomen and pelvis.  There was no bleeding      At this point we changed our gowns and gloves and instruments for a formal changeover.  The field was redraped.  We inspected the abdomen the pelvis one more time and found no bleeding.  The inferior third of the midline fascia was closed with interrupted #1 Novafil's to repair the area of the umbilical  hernia.  The rest of the incision was closed with a running, double-stranded, #1 PDS.  All the skin incisions were closed with skin staples after irrigation.  Dry honeycomb protocol bandage was placed.  The patient tolerated the procedure well was taken to PACU in stable condition.  EBL 150 cc.  Counts correct.  Complications none     Monaye Blackie M. Dalbert Batman, M.D., FACS General and Minimally Invasive Surgery Breast and Colorectal Surgery  07/21/2018 12:42 PM

## 2018-07-22 ENCOUNTER — Encounter (HOSPITAL_COMMUNITY): Payer: Self-pay | Admitting: General Surgery

## 2018-07-22 LAB — BASIC METABOLIC PANEL
Anion gap: 10 (ref 5–15)
BUN: 11 mg/dL (ref 8–23)
CO2: 22 mmol/L (ref 22–32)
Calcium: 8.2 mg/dL — ABNORMAL LOW (ref 8.9–10.3)
Chloride: 105 mmol/L (ref 98–111)
Creatinine, Ser: 1.57 mg/dL — ABNORMAL HIGH (ref 0.61–1.24)
GFR calc Af Amer: 48 mL/min — ABNORMAL LOW (ref >60)
GFR, EST NON AFRICAN AMERICAN: 42 mL/min — AB (ref >60)
GLUCOSE: 147 mg/dL — AB (ref 70–99)
Potassium: 4.1 mmol/L (ref 3.5–5.1)
Sodium: 137 mmol/L (ref 135–145)

## 2018-07-22 LAB — CBC
HCT: 35.5 % — ABNORMAL LOW (ref 39.0–52.0)
Hemoglobin: 11.7 g/dL — ABNORMAL LOW (ref 13.0–17.0)
MCH: 30.6 pg (ref 26.0–34.0)
MCHC: 33 g/dL (ref 30.0–36.0)
MCV: 92.9 fL (ref 80.0–100.0)
Platelets: 293 10*3/uL (ref 150–400)
RBC: 3.82 MIL/uL — ABNORMAL LOW (ref 4.22–5.81)
RDW: 14.5 % (ref 11.5–15.5)
WBC: 13.1 10*3/uL — ABNORMAL HIGH (ref 4.0–10.5)
nRBC: 0 % (ref 0.0–0.2)

## 2018-07-22 NOTE — Progress Notes (Addendum)
1 Day Post-Op  Subjective: Stable.  Alert.  Cooperative.  Mental status normal Complains of nausea and bloating Denies shortness of breath Says abdominal pain control is good  BP 124/71.  SPO2 92%.  Uses CPAP intermittently.  Followed by RT.  Respiratory rate 18.  Heart rate 79. Urine output 235 cc overnight.  Hemoglobin 11.7.  Stable.  Creatinine 1.57.  Up a little bit over baseline.  BUN 11.  Potassium 4.1.  Glucose 147.   Objective: Vital signs in last 24 hours: Temp:  [97.5 F (36.4 C)-98.5 F (36.9 C)] 98.3 F (36.8 C) (03/11 0459) Pulse Rate:  [74-101] 79 (03/11 0459) Resp:  [15-40] 18 (03/11 0459) BP: (108-155)/(58-93) 124/71 (03/11 0459) SpO2:  [86 %-99 %] 92 % (03/11 0459) Weight:  [85.3 kg-86.7 kg] 86.7 kg (03/10 1605) Last BM Date: 07/21/18  Intake/Output from previous day: 03/10 0701 - 03/11 0700 In: 2771.5 [I.V.:2771.5] Out: 335 [Urine:235; Blood:100] Intake/Output this shift: Total I/O In: 1671.5 [I.V.:1671.5] Out: -   General appearance: Alert.  Cooperative.  Mild discomfort from nausea Resp: Clear to auscultation anteriorly.  No rhonchi or wheeze GI: Silent.  Somewhat distended.  Wound looks good.  Lab Results:  Results for orders placed or performed during the hospital encounter of 07/21/18 (from the past 24 hour(s))  Type and screen [facility]     Status: None   Collection Time: 07/21/18  8:17 AM  Result Value Ref Range   ABO/RH(D) A POS    Antibody Screen NEG    Sample Expiration      07/24/2018 Performed at [facility], [place] [address], [place], [place] [address]   ABO/Rh     Status: None   Collection Time: 07/21/18  8:17 AM  Result Value Ref Range   ABO/RH(D)      A POS Performed at [facility] [address] [place], [place] [address]   CBC     Status: Abnormal   Collection Time: 07/21/18  4:23 PM  Result Value Ref Range   WBC 13.3 (H) 4.0 - 10.5 K/uL   RBC 4.22 4.22 - 5.81 MIL/uL   Hemoglobin 12.7  (L) 13.0 - 17.0 g/dL   HCT 39.3 39.0 - 52.0 %   MCV 93.1 80.0 - 100.0 fL   MCH 30.1 26.0 - 34.0 pg   MCHC 32.3 30.0 - 36.0 g/dL   RDW 14.2 11.5 - 15.5 %   Platelets 315 150 - 400 K/uL   nRBC 0.0 0.0 - 0.2 %  Creatinine, serum     Status: Abnormal   Collection Time: 07/21/18  4:23 PM  Result Value Ref Range   Creatinine, Ser 1.45 (H) 0.61 - 1.24 mg/dL   GFR calc non Af Amer 46 (L) >60 mL/min   GFR calc Af Amer 53 (L) >60 mL/min  Basic metabolic panel     Status: Abnormal   Collection Time: 07/22/18  3:32 AM  Result Value Ref Range   Sodium 137 135 - 145 mmol/L   Potassium 4.1 3.5 - 5.1 mmol/L   Chloride 105 98 - 111 mmol/L   CO2 22 22 - 32 mmol/L   Glucose, Bld 147 (H) 70 - 99 mg/dL   BUN 11 8 - 23 mg/dL   Creatinine, Ser 1.57 (H) 0.61 - 1.24 mg/dL   Calcium 8.2 (L) 8.9 - 10.3 mg/dL   GFR calc non Af Amer 42 (L) >60 mL/min   GFR calc Af Amer 48 (L) >60  mL/min   Anion gap 10 5 - 15  CBC     Status: Abnormal   Collection Time: 07/22/18  3:32 AM  Result Value Ref Range   WBC 13.1 (H) 4.0 - 10.5 K/uL   RBC 3.82 (L) 4.22 - 5.81 MIL/uL   Hemoglobin 11.7 (L) 13.0 - 17.0 g/dL   HCT 35.5 (L) 39.0 - 52.0 %   MCV 92.9 80.0 - 100.0 fL   MCH 30.6 26.0 - 34.0 pg   MCHC 33.0 30.0 - 36.0 g/dL   RDW 14.5 11.5 - 15.5 %   Platelets 293 150 - 400 K/uL   nRBC 0.0 0.0 - 0.2 %     Studies/Results: No results found.  [name] alvimopan  12 mg Oral BID  . celecoxib  200 mg Oral BID  . enoxaparin (LOVENOX) injection  40 mg Subcutaneous Q24H  . feeding supplement  237 mL Oral BID BM  . gabapentin  300 mg Oral BID  . saccharomyces boulardii  250 mg Oral BID     Assessment/Plan: s/p Procedure(s): LAPARASCOPIC TAKEDOWN OF SPLENIC FLEXURE; OPEN LEFT COLETCTOMY WITH PRIMARY ANASTOMOSIS PRIMARY REPAIR OF UMBILICAL HERNIA , ADULT Laparoscopic Lysis Of Adhesions  POD #1.  Laparotomy, left colectomy with primary anastomosis Ileus evident--- go slow with diet Entereg protocol Mobilize out of  bed. Incentive spirometry Begin Lovenox for DVT prophylaxis  Elevated creatinine.  Suspect volume depletion from bowel prep IV bolus Check labs tomorrow  OSA with intermittent CPAP at home.  SPO2 at satisfactory.  RT following and appreciated  Hypertension.  Well controlled  @PROBHOSP @  LOS: 1 day    Adin Hector 07/22/2018  . .prob

## 2018-07-22 NOTE — Evaluation (Signed)
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 presenting with obstructing mass of his distal descending colon. Pt s/p LAPARASCOPIC TAKEDOWN OF SPLENIC FLEXURE; OPEN LEFT COLETCTOMY WITH PRIMARY ANASTOMOSIS with PRIMARY REPAIR OF UMBILICAL HERNIA and Laparoscopic Lysis Of Adhesions 07/21/2018 Past history significant for bilateral carotid stenosis, noncritical, 65%. OSA with CPAP.  Anal polyps and hemorrhoids   Clinical Impression  Patient is s/p above surgery resulting in functional limitations due to the deficits listed below (see PT Problem List). Pt is limited in safe mobility by pain and decreased safety awareness manifested as holding his breath with ambulation to decrease pain and experiencing oxygen desaturation (see General Comments). Pt currently min guard for bed mobility, transfers and ambulation of 300 feet. Pt requires minA for ascent/descend 12 steps with handrails. Patient will benefit from skilled PT in the acute setting for further education on pain reduced movement to increase their independence and safety with mobility to allow discharge to the venue listed below.       Follow Up Recommendations No PT follow up;Supervision/Assistance - 24 hour    Equipment Recommendations  Rolling walker with 5" wheels    Recommendations for Other Services       Precautions / Restrictions Precautions Precautions: None Restrictions Weight Bearing Restrictions: No      Mobility  Bed Mobility Overal bed mobility: Needs Assistance Bed Mobility: Supine to Sit     Supine to sit: Min guard     General bed mobility comments: min guard for safety, advised log rolling however pt states with his OA it is hard to exit bed that way  Transfers Overall transfer level: Needs assistance Equipment used: None Transfers: Sit  to/from Stand Sit to Stand: Min guard         General transfer comment: min guard for safety, good power up and steadying using IV pole  Ambulation/Gait Ambulation/Gait assistance: Min guard Gait Distance (Feet): 350 Feet Assistive device: IV Pole;None Gait Pattern/deviations: Step-through pattern;Antalgic Gait velocity: slowed Gait velocity interpretation: >2.62 ft/sec, indicative of community ambulatory General Gait Details: hands on min guard for safety. Pt with steady gait that picked up in velocity with distance.   Stairs Stairs: Yes Stairs assistance: Min assist Stair Management: One rail Left;Forwards;Alternating pattern Number of Stairs: 12 General stair comments: minA for steadying with ascent/descent of 12 steps, pt requires cuing for breathing       Balance Overall balance assessment: Needs assistance Sitting-balance support: Feet supported;No upper extremity supported Sitting balance-Leahy Scale: Fair     Standing balance support: No upper extremity supported Standing balance-Leahy Scale: Fair                               Pertinent Vitals/Pain Pain Assessment: 0-10 Pain Score: 7  Pain Descriptors / Indicators: Grimacing;Guarding;Sharp;Shooting Pain Intervention(s): Limited activity within patient's tolerance;Monitored during session;Repositioned    Home Living Family/patient expects to be discharged to:: Private residence Living Arrangements: Spouse/significant other Available Help at Discharge: Family;Available 24 hours/day Type of Home: House Home Access: Stairs to enter   Entrance Stairs-Number of Steps: 5 Home Layout: 1/2 bath on main level;Able to live on main level with bedroom/bathroom;Two level Home Equipment: Crutches;Hand held shower head      Prior Function Level of Independence: Independent  Extremity/Trunk ssessment   Upper Extremity Assessment Upper Extremity Assessment: Overall WFL for tasks  assessed    Lower Extremity Assessment Lower Extremity Assessment: RLE deficits/detail;LLE deficits/detail RLE Deficits / Details: ROM and strength WFL RLE Sensation: history of peripheral neuropathy LLE Deficits / Details: ROM and strength WFL  LLE Sensation: history of peripheral neuropathy    Cervical / Trunk Assessment Cervical / Trunk Assessment: Normal  Communication   Communication: No difficulties  Cognition Arousal/Alertness: Awake/alert Behavior During Therapy: WFL for tasks assessed/performed Overall Cognitive Status: Within Functional Limits for tasks assessed                                        General Comments General comments (skin integrity, edema, etc.): Pt wife present throughout session, Pt with c/o of being "swimmy headed." and requested to return to room. Pt with SaO2 of 79%O2 on RA and HR 125 bpm, with cuing for pursed lipped breathing SaO2 quickly rebounded to 90%O2 and HR decreased to 100bpm.  in retrospect pt agrees that his velocity increased and he held his breath to decrease pain. Educated on need for better breathing with ambulatio. Also educated on need for good pain control        Assessment/Plan    PT Assessment Patient needs continued PT services  PT Problem List Decreased strength;Decreased range of motion;Decreased activity tolerance;Pain       PT Treatment Interventions DME instruction;Gait training;Stair training;Functional mobility training;Therapeutic exercise;Therapeutic activities;Balance training;Patient/family education    PT Goals (Current goals can be found in the Care Plan section)  Acute Rehab PT Goals Patient Stated Goal: have less pain with movement PT Goal Formulation: With patient/family Time For Goal Achievement: 08/05/18 Potential to Achieve Goals: Fair    Frequency Min 3X/week    AM-PAC PT "6 Clicks" Mobility  Outcome Measure Help needed turning from your back to your side while in a flat bed  without using bedrails?: A Little Help needed moving from lying on your back to sitting on the side of a flat bed without using bedrails?: A Little Help needed moving to and from a bed to a chair (including a wheelchair)?: A Little Help needed standing up from a chair using your arms (e.g., wheelchair or bedside chair)?: A Little Help needed to walk in hospital room?: A Little Help needed climbing 3-5 steps with a railing? : A Little 6 Click Score: 18    End of Session Equipment Utilized During Treatment: Gait belt Activity Tolerance: Patient limited by pain Patient left: in bed;with call bell/phone within reach;with family/visitor present Nurse Communication: Mobility status;Patient requests pain meds;Precautions PT Visit Diagnosis: Other abnormalities of gait and mobility (R26.89);Difficulty in walking, not elsewhere classified (R26.2);Pain Pain - part of body: (abdomen)    Time: 2585-2778 PT Time Calculation (min) (ACUTE ONLY): 30 min   Charges:   PT Evaluation $PT Eval Moderate Complexity: 1 Mod PT Treatments $Gait Training: 8-22 mins        Grizelda Piscopo B. Migdalia Dk PT, DPT Acute Rehabilitation Services Pager [phone] Office [phone]   [facility] 07/22/2018, 3:43 PM

## 2018-07-23 LAB — BASIC METABOLIC PANEL
Anion gap: 6 (ref 5–15)
BUN: 17 mg/dL (ref 8–23)
CO2: 26 mmol/L (ref 22–32)
Calcium: 8 mg/dL — ABNORMAL LOW (ref 8.9–10.3)
Chloride: 107 mmol/L (ref 98–111)
Creatinine, Ser: 1.38 mg/dL — ABNORMAL HIGH (ref 0.61–1.24)
GFR calc Af Amer: 56 mL/min — ABNORMAL LOW (ref >60)
GFR calc non Af Amer: 49 mL/min — ABNORMAL LOW (ref >60)
Glucose, Bld: 102 mg/dL — ABNORMAL HIGH (ref 70–99)
Potassium: 3.6 mmol/L (ref 3.5–5.1)
SODIUM: 139 mmol/L (ref 135–145)

## 2018-07-23 LAB — CBC
HCT: 30.8 % — ABNORMAL LOW (ref 39.0–52.0)
Hemoglobin: 9.6 g/dL — ABNORMAL LOW (ref 13.0–17.0)
MCH: 29.1 pg (ref 26.0–34.0)
MCHC: 31.2 g/dL (ref 30.0–36.0)
MCV: 93.3 fL (ref 80.0–100.0)
Platelets: 232 10*3/uL (ref 150–400)
RBC: 3.3 MIL/uL — ABNORMAL LOW (ref 4.22–5.81)
RDW: 14.6 % (ref 11.5–15.5)
WBC: 10.2 10*3/uL (ref 4.0–10.5)
nRBC: 0 % (ref 0.0–0.2)

## 2018-07-23 MED ORDER — PNEUMOCOCCAL VAC POLYVALENT 25 MCG/0.5ML IJ INJ
0.5000 mL | INJECTION | INTRAMUSCULAR | Status: DC
  Filled 2018-07-23: qty 0.5

## 2018-07-23 MED ORDER — PANTOPRAZOLE SODIUM 40 MG PO TBEC
40.0000 mg | DELAYED_RELEASE_TABLET | Freq: Two times a day (BID) | ORAL | Status: DC
  Administered 2018-07-23 – 2018-07-25 (×5): 40 mg via ORAL
  Filled 2018-07-23 (×5): qty 1

## 2018-07-23 MED ORDER — INFLUENZA VAC SPLIT HIGH-DOSE 0.5 ML IM SUSY
0.5000 mL | PREFILLED_SYRINGE | INTRAMUSCULAR | Status: DC
  Filled 2018-07-23: qty 0.5

## 2018-07-23 MED ORDER — HYDROMORPHONE HCL 1 MG/ML IJ SOLN
1.0000 mg | INTRAMUSCULAR | Status: DC | PRN
  Administered 2018-07-23 – 2018-07-24 (×3): 1 mg via INTRAVENOUS
  Filled 2018-07-23 (×3): qty 1

## 2018-07-23 NOTE — Progress Notes (Signed)
2 Days Post-Op  Subjective: Doing better.  Nausea has resolved, not really hungry.  A little reflux reported..  Had 3 loose stools.  Pain under control.  Ambulated in hall with PT.   BP 127/67.  Heart rate 82.  Afebrile.  SPO2 93% on room air. Creatinine 1.38, coming down toward baseline.  Glucose 102.  Potassium 3.6.  WBC 10,200.  Hemoglobin has drifted down to 9.6.  Probably re-equilibrating following IV bolus yesterday  Pathology pending.  He knows I think that this is a malignancy.  Objective: Vital signs in last 24 hours: Temp:  [97.8 F (36.6 C)-98.3 F (36.8 C)] 98.3 F (36.8 C) (03/12 0436) Pulse Rate:  [70-82] 82 (03/12 0436) Resp:  [18] 18 (03/12 0436) BP: (114-126)/(52-92) 126/67 (03/12 0436) SpO2:  [91 %-94 %] 93 % (03/12 0436) Weight:  [93 kg] 93 kg (03/12 0500) Last BM Date: 07/21/18  Intake/Output from previous day: 03/11 0701 - 03/12 0700 In: 160 [P.O.:160] Out: 450 [Urine:450] Intake/Output this shift: No intake/output data recorded.    EXAM: General appearance: Alert.  Cooperative.  Looks better.  Positive spirits.  No obvious distress Resp: Clear to auscultation anteriorly.  No rhonchi or wheeze GI: Bowel sounds have returned.  Still protuberant but softer.  Wound looks fine Extremities: Soft.  Nontender.  No edema   Lab Results:  Results for orders placed or performed during the hospital encounter of 07/21/18 (from the past 24 hour(s))  CBC     Status: Abnormal   Collection Time: 07/23/18  2:25 AM  Result Value Ref Range   WBC 10.2 4.0 - 10.5 K/uL   RBC 3.30 (L) 4.22 - 5.81 MIL/uL   Hemoglobin 9.6 (L) 13.0 - 17.0 g/dL   HCT 30.8 (L) 39.0 - 52.0 %   MCV 93.3 80.0 - 100.0 fL   MCH 29.1 26.0 - 34.0 pg   MCHC 31.2 30.0 - 36.0 g/dL   RDW 14.6 11.5 - 15.5 %   Platelets 232 150 - 400 K/uL   nRBC 0.0 0.0 - 0.2 %  Basic metabolic panel     Status: Abnormal   Collection Time: 07/23/18  2:25 AM  Result Value Ref Range   Sodium 139 135 - 145 mmol/L   Potassium 3.6 3.5 - 5.1 mmol/L   Chloride 107 98 - 111 mmol/L   CO2 26 22 - 32 mmol/L   Glucose, Bld 102 (H) 70 - 99 mg/dL   BUN 17 8 - 23 mg/dL   Creatinine, Ser 1.38 (H) 0.61 - 1.24 mg/dL   Calcium 8.0 (L) 8.9 - 10.3 mg/dL   GFR calc non Af Amer 49 (L) >60 mL/min   GFR calc Af Amer 56 (L) >60 mL/min   Anion gap 6 5 - 15     Studies/Results: No results found.  [name] alvimopan  12 mg Oral BID  . enoxaparin (LOVENOX) injection  40 mg Subcutaneous Q24H  . feeding supplement  237 mL Oral BID BM  . gabapentin  300 mg Oral BID  . pantoprazole  40 mg Oral BID     Assessment/Plan: s/p Procedure(s): LAPARASCOPIC TAKEDOWN OF SPLENIC FLEXURE; OPEN LEFT COLETCTOMY WITH PRIMARY ANASTOMOSIS PRIMARY REPAIR OF UMBILICAL HERNIA , ADULT Laparoscopic Lysis Of Adhesions  POD #2.  Laparotomy, left colectomy with primary anastomosis Good progress over the last 24 hours Ileus resolving -advance clear liquids to full liquids Entereg protocol Mobilize out of bed.  PT involvement has been very beneficial Incentive spirometry  Lovenox for DVT prophylaxis  Foley out this morning Change dressing tomorrow Try to reduce narcotic intake Await pathology  Elevated creatinine.  Suspect volume depletion from bowel prep IV bolus Creatinine coming down  GERD.  Protonix 40 mg twice daily started today  OSA with intermittent CPAP at home.  SPO2 at satisfactory.  RT following and appreciated  Hypertension.  Well controlled   @PROBHOSP @  LOS: 2 days    Adin Hector 07/23/2018  . .prob

## 2018-07-23 NOTE — TOC Initial Note (Signed)
Transition of Care [facility]) - Initial/Assessment Note    Patient Details  Name: Glen [name] MRN: [id number] Date of Birth: [birth year]  Transition of Care [facility]) CM/SW Contact:    Marilu Favre, RN Phone Number: 07/23/2018, 3:32 PM  Clinical Narrative:                    PAtient from home with wife.   PCP DR [place]   Patient has transportation to appointments and has prescription coverage.   At this time he does feel he needs a walker for home.   Lives with wife who can provide 24 hour assistance. Barriers to Discharge: No Barriers Identified   Patient Goals and CMS Choice Patient states their goals for this hospitalization and ongoing recovery are:: to go home  CMS Medicare.gov Compare Post Acute Care list provided to:: Patient Choice offered to / list presented to : Patient  Expected Discharge Plan and Services     Post Acute Care Choice: Durable Medical Equipment(PT recommended walker, patient does not feel he needs walker at this time.)                       HH Arranged: (None recommended )    Prior Living Arrangements/Services   Lives with:: Spouse Patient language and need for interpreter reviewed:: Yes Do you feel safe going back to the place where you live?: Yes          Current home services: (none ) Criminal Activity/Legal Involvement Pertinent to Current Situation/Hospitalization: No - Comment as needed  Activities of Daily Living Home Assistive Devices/Equipment: Eyeglasses, Blood pressure cuff ADL Screening (condition at time of admission) Patient's cognitive ability adequate to safely complete daily activities?: Yes Is the patient deaf or have difficulty hearing?: No Does the patient have difficulty seeing, even when wearing glasses/contacts?: No Does the patient have difficulty concentrating, remembering, or making decisions?: No Patient able to express need for assistance with ADLs?: Yes Does the patient have difficulty dressing  or bathing?: No Independently performs ADLs?: Yes (appropriate for developmental age) Does the patient have difficulty walking or climbing stairs?: No Weakness of Legs: Both Weakness of Arms/Hands: Both  Permission Sought/Granted                  Emotional Assessment Appearance:: Appears stated age   Affect (typically observed): Accepting, Appropriate Orientation: : Oriented to Self, Oriented to Place, Oriented to  Time, Oriented to Situation   Psych Involvement: No (comment)  Admission diagnosis:  left colon mass Patient Active Problem List   Diagnosis Date Noted  . Colonic mass 07/21/2018  . Obstructive sleep apnea 12/06/2016  . Lung nodules 06/11/2015  . Tobacco use disorder, mild, in sustained remission 01/01/2010  . URTICARIA 01/01/2010  . DYSPNEA 07/13/2009  . P U D 06/15/2009  . HYPERTENSION 06/08/2009  . ALLERGIC RHINITIS 06/08/2009  . COPD mixed type ([place]) 06/08/2009   PCP:  [name], MD Pharmacy:   [facility] [address], [place] - [address] [place] [address] Phone: [phone] Fax: [phone]  [facility] - [place], [place] - [address] AT [place] [address] [place] [address] Phone: [phone] Fax: [phone]  [facility] [place], [place] - [address] AT [facility] OF [place] [address] [place] [address] Phone: [phone] Fax: [phone]     Social Determinants of Health (SDOH)  Interventions    Readmission Risk Interventions 30 Day Unplanned Readmission Risk Score     Admission (Current) from 07/21/2018 in [facility]  30 Day Unplanned Readmission Risk Score (%)  9 Filed at 07/23/2018 1200     This score is the patient's risk of an unplanned readmission within 30 days of being discharged (0 -100%). The score is based on dignosis, age, lab data, medications, orders, and  past utilization.   Low:  0-14.9   Medium: 15-21.9   High: 22-29.9   Extreme: 30 and above       No flowsheet data found.

## 2018-07-24 MED ORDER — HYDROCODONE-ACETAMINOPHEN 5-325 MG PO TABS: 1.0000 | ORAL_TABLET | Freq: Four times a day (QID) | ORAL | 0 refills | Status: DC | PRN

## 2018-07-24 MED ORDER — POTASSIUM CHLORIDE 2 MEQ/ML IV SOLN
INTRAVENOUS | Status: DC
  Administered 2018-07-24: 09:00:00 via INTRAVENOUS
  Filled 2018-07-24 (×2): qty 1000

## 2018-07-24 NOTE — Progress Notes (Signed)
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 presenting with obstructing mass of his distal descending colon. Pt s/p LAPARASCOPIC TAKEDOWN OF SPLENIC FLEXURE; OPEN LEFT COLETCTOMY WITH PRIMARY ANASTOMOSIS with PRIMARY REPAIR OF UMBILICAL HERNIA and Laparoscopic Lysis Of Adhesions 07/21/2018 Past history significant for bilateral carotid stenosis, noncritical, 65%. OSA with CPAP.  Anal polyps and hemorrhoids     PT Comments    Pt received standing up in room, adjusting bed, willing to participate in therapy but reports he has been walking 3 times already. Education provided on importance of continued mobility. Pt is overall safe and steady with his mobility, no safety concerns noted. Ambulated 400 ft with no AD; pt reports that his main limitation he has noticed is SOB with mobility. He feels confident with stairs. Pt will continue to benefit from skilled acute PT services in order to progress functional mobility safely.     Follow Up Recommendations  No PT follow up     Equipment Recommendations    None   Recommendations for Other Services       Precautions / Restrictions Precautions Precautions: None Restrictions Weight Bearing Restrictions: No    Mobility  Bed Mobility Overal bed mobility: Modified Independent             General bed mobility comments: Mod(I) for bed mobility, HOB elevated, able to position himself without assistance  Transfers Overall transfer level: Modified independent Equipment used: None Transfers: Sit to/from Stand           General transfer comment: Pt received in room, standing adjusting bed, sits to bed without difficulty. Sit to stand Mod(I) to/from bed for remainder of session  Ambulation/Gait Ambulation/Gait assistance: Min guard Gait Distance (Feet): 400  Feet Assistive device: None;IV Pole Gait Pattern/deviations: Step-through pattern;WFL(Within Functional Limits) Gait velocity: WNL   General Gait Details: Min guard for safety, first 200 ft with IV pole, last 200 ft with no AD. No LOB, no unsteadiness. Pt is safe and steady.   Stairs             Wheelchair Mobility    Modified Rankin (Stroke Patients Only)       Balance Overall balance assessment: No apparent balance deficits (not formally assessed)                                          Cognition Arousal/Alertness: Awake/alert Behavior During Therapy: WFL for tasks assessed/performed Overall Cognitive Status: Within Functional Limits for tasks assessed                                        Exercises      General Comments        Pertinent Vitals/Pain Pain Assessment: Faces Faces Pain Scale: Hurts a little bit Pain Location: abdomen Pain Descriptors / Indicators: Discomfort;Sore Pain Intervention(s): Monitored during session    Home Living                      Prior Function            PT Goals (current goals can now be found in the care plan section) Acute Rehab PT  Goals Patient Stated Goal: have less pain with movement PT Goal Formulation: With patient/family Time For Goal Achievement: 08/05/18 Potential to Achieve Goals: Good Progress towards PT goals: Progressing toward goals    Frequency    Min 3X/week      PT Plan Current plan remains appropriate    Co-evaluation              AM-PAC PT "6 Clicks" Mobility   Outcome Measure  Help needed turning from your back to your side while in a flat bed without using bedrails?: None Help needed moving from lying on your back to sitting on the side of a flat bed without using bedrails?: None Help needed moving to and from a bed to a chair (including a wheelchair)?: None Help needed standing up from a chair using your arms (e.g., wheelchair or  bedside chair)?: None Help needed to walk in hospital room?: None Help needed climbing 3-5 steps with a railing? : A Little 6 Click Score: 23    End of Session Equipment Utilized During Treatment: Gait belt Activity Tolerance: Patient tolerated treatment well Patient left: in bed;with call bell/phone within reach(pt refuses 3 bed rails up)   PT Visit Diagnosis: Other abnormalities of gait and mobility (R26.89);Difficulty in walking, not elsewhere classified (R26.2);Pain Pain - part of body: (abdomen)     Time:  -     Charges:                        Ronnell Guadalajara, SPT    Ronnell Guadalajara 07/24/2018, 12:23 PM

## 2018-07-24 NOTE — Progress Notes (Signed)
Patient placed himself on BIPAP for the night with IPAP set at 10cm and EPAP set at 6cm.

## 2018-07-24 NOTE — Care Management Important Message (Signed)
Important Message  Patient Details  Name: Glen [name] MRN: [id number] Date of Birth: 04-27-1941   Medicare Important Message Given:  Yes    [name] Montine Circle 07/24/2018, 4:19 PM

## 2018-07-24 NOTE — Progress Notes (Signed)
3 Days Post-Op  Subjective: Continues to improve.  Says he feels better.  Ambulated in hall.  Tolerated full liquids.  Had 3 small stools.  No nausea.  Pain seems controlled. Afebrile.  Heart rate 74.  BP 122/65.  SPO2 92% on room air   Foley out yesterday.  Voiding without difficulty.  Pathology shows adenocarcinoma of the colon, 7.5 cm.  Circumferential involvement.  Focal invasion of peri-colonic soft tissue.  Resection margins negative.  21 lymph nodes, all negative for carcinoma I discussed the pathology report with him. Plan referral to medical oncology as outpatient   Objective: Vital signs in last 24 hours: Temp:  [97.3 F (36.3 C)-98.3 F (36.8 C)] 97.3 F (36.3 C) (03/13 0448) Pulse Rate:  [73-86] 74 (03/13 0448) Resp:  [16-18] 18 (03/13 0448) BP: (107-135)/(63-68) 122/65 (03/13 0448) SpO2:  [92 %-93 %] 92 % (03/13 0448) Weight:  [93.9 kg] 93.9 kg (03/13 0500) Last BM Date: 07/23/18  Intake/Output from previous day: 03/12 0701 - 03/13 0700 In: 1676 [P.O.:829; I.V.:847] Out: 500 [Urine:500] Intake/Output this shift: Total I/O In: 240 [P.O.:240] Out: -     EXAM: General appearance:Alert. Cooperative.  Looks better.  Positive spirits.  No obvious distress Resp:Clear to auscultation anteriorly. No rhonchi or wheeze GI: Bowel sounds have returned.  Still protuberant but softer.  Dressing changed.  Wound is clean.  No drainage Extremities: Soft.  Nontender.  No edema   Lab Results:  No results found for this or any previous visit (from the past 24 hour(s)).   Studies/Results: No results found.  . enoxaparin (LOVENOX) injection  40 mg Subcutaneous Q24H  . feeding supplement  237 mL Oral BID BM  . gabapentin  300 mg Oral BID  . Influenza vac split quadrivalent PF  0.5 mL Intramuscular Tomorrow-1000  . pantoprazole  40 mg Oral BID  . pneumococcal 23 valent vaccine  0.5 mL Intramuscular Tomorrow-1000     Assessment/Plan: s/p Procedure(s): LAPARASCOPIC  TAKEDOWN OF SPLENIC FLEXURE; OPEN LEFT COLETCTOMY WITH PRIMARY ANASTOMOSIS PRIMARY REPAIR OF UMBILICAL HERNIA , ADULT Laparoscopic Lysis Of Adhesions   POD #3. Laparotomy, left colectomy with primary anastomosis Continues to improve Decrease IV rate Soft diet More ambulation encouraged Plan discharge tomorrow   Lovenox for DVT prophylaxis  Moderately differentiated adenocarcinoma descending colon with partial obstruction secondary to intussusception stage T3N0 Discussed with patient Referral to medical oncology as outpatient  Elevated creatinine. Suspect volume depletion from bowel prep Check labs tomorrow prior to discharge  GERD.  Protonix 40 mg twice daily started today  OSA with intermittent CPAP at home. SPO2 at satisfactory. RT following and appreciated  Hypertension.Well controlled  Disposition: Plan discharge tomorrow.   Follow-up in office at the end of next week for staple removal and wound check Discharge instructions loaded into AVS Prescription for Norco sent to pharmacy Encourage nonnarcotic pain medication   @PROBHOSP @  LOS: 3 days    Adin Hector 07/24/2018  . .prob

## 2018-07-25 ENCOUNTER — Other Ambulatory Visit: Payer: Self-pay | Admitting: Surgery

## 2018-07-25 LAB — CBC
HCT: 27.7 % — ABNORMAL LOW (ref 39.0–52.0)
HEMOGLOBIN: 8.9 g/dL — AB (ref 13.0–17.0)
MCH: 30 pg (ref 26.0–34.0)
MCHC: 32.1 g/dL (ref 30.0–36.0)
MCV: 93.3 fL (ref 80.0–100.0)
Platelets: 245 10*3/uL (ref 150–400)
RBC: 2.97 MIL/uL — ABNORMAL LOW (ref 4.22–5.81)
RDW: 14.3 % (ref 11.5–15.5)
WBC: 5.4 10*3/uL (ref 4.0–10.5)
nRBC: 0 % (ref 0.0–0.2)

## 2018-07-25 LAB — BASIC METABOLIC PANEL
Anion gap: 7 (ref 5–15)
BUN: <5 mg/dL — ABNORMAL LOW (ref 8–23)
CO2: 29 mmol/L (ref 22–32)
Calcium: 8.1 mg/dL — ABNORMAL LOW (ref 8.9–10.3)
Chloride: 105 mmol/L (ref 98–111)
Creatinine, Ser: 0.81 mg/dL (ref 0.61–1.24)
GFR calc Af Amer: >60 mL/min (ref >60)
GFR calc non Af Amer: >60 mL/min (ref >60)
Glucose, Bld: 107 mg/dL — ABNORMAL HIGH (ref 70–99)
Potassium: 3.3 mmol/L — ABNORMAL LOW (ref 3.5–5.1)
Sodium: 141 mmol/L (ref 135–145)

## 2018-07-25 MED ORDER — HYDROCODONE-ACETAMINOPHEN 5-325 MG PO TABS: 1.0000 | ORAL_TABLET | ORAL | 0 refills | Status: DC | PRN

## 2018-07-25 MED ORDER — POTASSIUM CHLORIDE ER 10 MEQ PO TBCR: 10.0000 meq | EXTENDED_RELEASE_TABLET | Freq: Every day | ORAL | 0 refills | Status: DC

## 2018-07-25 MED ORDER — POTASSIUM CHLORIDE CRYS ER 20 MEQ PO TBCR
20.0000 meq | EXTENDED_RELEASE_TABLET | Freq: Once | ORAL | Status: AC
  Administered 2018-07-25: 20 meq via ORAL
  Filled 2018-07-25: qty 1

## 2018-07-25 NOTE — Progress Notes (Signed)
Discharged pt to home. Alert, oriented and stable. Instructions given and explained.

## 2018-07-25 NOTE — Discharge Summary (Signed)
[facility] Discharge Summary   Patient ID: Glen [name] MRN: [id number] DOB/AGE: [birth year] 78 y.o.  Admit date: 07/21/2018 Discharge date: 07/25/2018  Admitting Diagnosis: Partially obstructing descending colon mass Umbilical hernia   Discharge Diagnosis Same as above  Consultants None   Imaging: No results found.  Procedures Dr. Dalbert Batman (07/21/18) - laparoscopic lysis of adhesions, laparoscopic takedown of splenic flexure, open left colectomy with primary anastomosis, primary repair of umbilical hernia  Hospital Course:  Patient is a 78 year old male who presented to [facility] OR for left colectomy.  Patient was admitted and underwent procedure listed above.  Tolerated procedure well and was transferred to the floor.  Diet was advanced as tolerated.  On POD#4, the patient was voiding well, tolerating diet, ambulating well, pain well controlled, vital signs stable, incisions c/d/i and felt stable for discharge home.  Patient will follow up in our office in 1 weeks and knows to call with questions or concerns.  He will call to confirm appointment date/time.    Physical Exam: General:  Alert, NAD, pleasant, comfortable Abd:  Soft, ND, mild tenderness, incisions C/D/I  Allergies as of 07/25/2018      Reactions   Aspirin Other (See Comments)   pt has bleeding ulcers   Lisinopril Cough      Medication List    STOP taking these medications   metroNIDAZOLE 500 MG tablet Commonly known as:  FLAGYL   neomycin 500 MG tablet Commonly known as:  MYCIFRADIN     TAKE these medications   acetaminophen 650 MG CR tablet Commonly known as:  TYLENOL Take 1,950 mg by mouth at bedtime.   ALPRAZolam 0.25 MG tablet Commonly known as:  XANAX Take 0.25 mg by mouth daily as needed for anxiety.   amLODipine 10 MG tablet Commonly known as:  NORVASC Take 10 mg by mouth every evening.   aspirin EC 81 MG tablet Take 81 mg by mouth at bedtime.   atorvastatin 40 MG  tablet Commonly known as:  LIPITOR Take 40 mg by mouth at bedtime.   diphenhydrAMINE 25 MG tablet Commonly known as:  BENADRYL Take 75 mg by mouth at bedtime.   gabapentin 100 MG capsule Commonly known as:  Neurontin 1 in the morning, 3 in the evening What changed:    how much to take  how to take this  when to take this  additional instructions   hydrochlorothiazide 12.5 MG capsule Commonly known as:  MICROZIDE Take 12.5 mg by mouth daily.   HYDROcodone-acetaminophen 5-325 MG tablet Commonly known as:  Norco Take 1-2 tablets by mouth every 6 (six) hours as needed for moderate pain or severe pain.   levalbuterol 0.63 MG/3ML nebulizer solution Commonly known as:  XOPENEX USE 1 VIAL VIA NEBULIZER EVERY 4 HOURS AS NEEDED FOR WHEEZING OR SHORTNESS OF BREATH What changed:  See the new instructions.   omeprazole 20 MG capsule Commonly known as:  PRILOSEC Take 20 mg by mouth daily.   potassium chloride 10 MEQ tablet Commonly known as:  K-DUR Take 1 tablet (10 mEq total) by mouth daily for 5 days.   ProAir HFA 108 (90 Base) MCG/ACT inhaler Generic drug:  albuterol INHALE 2 PUFFS INTO THE LUNGS EVERY 4 HOURS AS NEEDED FOR WHEEZING OR SHORTNESS OF BREATH. What changed:  See the new instructions.   sertraline 100 MG tablet Commonly known as:  ZOLOFT Take 100 mg by mouth daily.   sodium chloride 0.65 % Soln nasal spray Commonly known as:  OCEAN Place 1 spray into both nostrils as needed for congestion.   telmisartan 40 MG tablet Commonly known as:  MICARDIS Take 40 mg by mouth daily.   Trelegy Ellipta 100-62.5-25 MCG/INH Aepb Generic drug:  Fluticasone-Umeclidin-Vilant INHALE 1 PUFF INTO THE LUNGS DAILY What changed:  See the new instructions.   vitamin B-12 1000 MCG tablet Commonly known as:  CYANOCOBALAMIN Take 1,000 mcg by mouth daily.        Follow-up Information    Fanny Skates, MD. Schedule an appointment as soon as possible for a visit in 1  week(s).   Specialty:  General Surgery Why:  See Dr. Dalbert Batman or his nurse next Friday to get the staples removed Contact information: [address] [place] [address] [phone]           Signed: Brigid Re, [facility] 07/25/2018, 8:05 AM Pager: [phone] Consults: [phone]

## 2018-07-25 NOTE — Discharge Instructions (Signed)
[facility]      [facility] °[phone] ° °OPEN ABDOMINAL SURGERY: POST OP INSTRUCTIONS ° °Always review your discharge instruction sheet given to you by the facility where your surgery was performed. ° °IF YOU HAVE DISABILITY OR FAMILY LEAVE FORMS, YOU MUST BRING THEM TO THE OFFICE FOR PROCESSING.  PLEASE DO NOT GIVE THEM TO YOUR DOCTOR. ° °1. A prescription for pain medication may be given to you upon discharge.  Take your pain medication as prescribed, if needed.  If narcotic pain medicine is not needed, then you may take acetaminophen (Tylenol) or ibuprofen (Advil) as needed. °2. Take your usually prescribed medications unless otherwise directed. °3. If you need a refill on your pain medication, please contact your pharmacy. They will contact our office to request authorization.  Prescriptions will not be filled after 5pm or on week-ends. °4. You should follow a light diet the first few days after arrival home, such as soup and crackers, pudding, etc.unless your doctor has advised otherwise. A high-fiber, low fat diet can be resumed as tolerated.   Be sure to include lots of fluids daily. Most patients will experience some swelling and bruising on the chest and neck area.  Ice packs will help.  Swelling and bruising can take several days to resolve °5. Most patients will experience some swelling and bruising in the area of the incision. Ice pack will help. Swelling and bruising can take several days to resolve..  °6. It is common to experience some constipation if taking pain medication after surgery.  Increasing fluid intake and taking a stool softener will usually help or prevent this problem from occurring.  A mild laxative (Milk of Magnesia or Miralax) should be taken according to package directions if there are no bowel movements after 48 hours. °7.  You may have steri-strips (small skin tapes) in place directly over the incision.  These strips should be left on the skin for 7-10 days.  If your  surgeon used skin glue on the incision, you may shower in 24 hours.  The glue will flake off over the next 2-3 weeks.  Any sutures or staples will be removed at the office during your follow-up visit. You may find that a light gauze bandage over your incision may keep your staples from being rubbed or pulled. You may shower and replace the bandage daily. °8. ACTIVITIES:  You may resume regular (light) daily activities beginning the next day--such as daily self-care, walking, climbing stairs--gradually increasing activities as tolerated.  You may have sexual intercourse when it is comfortable.  Refrain from any heavy lifting or straining until approved by your doctor. °a. You may drive when you no longer are taking prescription pain medication, you can comfortably wear a seatbelt, and you can safely maneuver your car and apply brakes °b. Return to Work: ___________________________________ °9. You should see your doctor in the office for a follow-up appointment approximately two weeks after your surgery.  Make sure that you call for this appointment within a day or two after you arrive home to insure a convenient appointment time. °OTHER INSTRUCTIONS:  °_____________________________________________________________ °_____________________________________________________________ ° °WHEN TO CALL YOUR DOCTOR: °1. Fever over 101.0 °2. Inability to urinate °3. Nausea and/or vomiting °4. Extreme swelling or bruising °5. Continued bleeding from incision. °6. Increased pain, redness, or drainage from the incision. °7. Difficulty swallowing or breathing °8. Muscle cramping or spasms. °9. Numbness or tingling in hands or feet or around lips. ° °The clinic staff is available to   answer your questions during regular business hours.  Please dont hesitate to call and ask to speak to one of the nurses if you have concerns.  For further questions, please visit [email or web address]         Use Tylenol or ibuprofen  as outlined below for pain control A prescription for hydrocodone has been called into your pharmacy in case you need it            Managing Your Pain After Surgery Without Opioids    Thank you for participating in our program to help patients manage their pain after surgery without opioids. This is part of our effort to provide you with the best care possible, without exposing you or your family to the risk that opioids pose.  What pain can I expect after surgery? You can expect to have some pain after surgery. This is normal. The pain is typically worse the day after surgery, and quickly begins to get better. Many studies have found that many patients are able to manage their pain after surgery with Over-the-Counter (OTC) medications such as Tylenol and Motrin. If you have a condition that does not allow you to take Tylenol or Motrin, notify your surgical team.  How will I manage my pain? The best strategy for controlling your pain after surgery is around the clock pain control with Tylenol (acetaminophen) and Motrin (ibuprofen or Advil). Alternating these medications with each other allows you to maximize your pain control. In addition to Tylenol and Motrin, you can use heating pads or ice packs on your incisions to help reduce your pain.  How will I alternate your regular strength over-the-counter pain medication? You will take a dose of pain medication every three hours. ; Start by taking 650 mg of Tylenol (2 pills of 325 mg) ; 3 hours later take 600 mg of Motrin (3 pills of 200 mg) ; 3 hours after taking the Motrin take 650 mg of Tylenol ; 3 hours after that take 600 mg of Motrin.   - 1 -  See example - if your first dose of Tylenol is at 12:00 PM   12:00 PM Tylenol 650 mg (2 pills of 325 mg)  3:00 PM Motrin 600 mg (3 pills of 200 mg)  6:00 PM Tylenol 650 mg (2 pills of 325 mg)  9:00 PM Motrin 600 mg (3 pills of 200 mg)  Continue alternating every 3 hours   We  recommend that you follow this schedule around-the-clock for at least 3 days after surgery, or until you feel that it is no longer needed. Use the table on the last page of this handout to keep track of the medications you are taking. Important: Do not take more than 3000mg  of Tylenol or 3200mg  of Motrin in a 24-hour period. Do not take ibuprofen/Motrin if you have a history of bleeding stomach ulcers, severe kidney disease, &/or actively taking a blood thinner  What if I still have pain? If you have pain that is not controlled with the over-the-counter pain medications (Tylenol and Motrin or Advil) you might have what we call breakthrough pain. You will receive a prescription for a small amount of an opioid pain medication such as Oxycodone, Tramadol, or Tylenol with Codeine. Use these opioid pills in the first 24 hours after surgery if you have breakthrough pain. Do not take more than 1 pill every 4-6 hours.  If you still have uncontrolled pain after using all opioid pills, don't hesitate to call our staff  using the number provided. We will help make sure you are managing your pain in the best way possible, and if necessary, we can provide a prescription for additional pain medication.   Day 1    Time  Name of Medication Number of pills taken  Amount of Acetaminophen  Pain Level   Comments  AM PM       AM PM       AM PM       AM PM       AM PM       AM PM       AM PM       AM PM       Total Daily amount of Acetaminophen Do not take more than  3,000 mg per day      Day 2    Time  Name of Medication Number of pills taken  Amount of Acetaminophen  Pain Level   Comments  AM PM       AM PM       AM PM       AM PM       AM PM       AM PM       AM PM       AM PM       Total Daily amount of Acetaminophen Do not take more than  3,000 mg per day      Day 3    Time  Name of Medication Number of pills taken  Amount of Acetaminophen  Pain Level   Comments  AM PM        AM PM       AM PM       AM PM          AM PM       AM PM       AM PM       AM PM       Total Daily amount of Acetaminophen Do not take more than  3,000 mg per day      Day 4    Time  Name of Medication Number of pills taken  Amount of Acetaminophen  Pain Level   Comments  AM PM       AM PM       AM PM       AM PM       AM PM       AM PM       AM PM       AM PM       Total Daily amount of Acetaminophen Do not take more than  3,000 mg per day      Day 5    Time  Name of Medication Number of pills taken  Amount of Acetaminophen  Pain Level   Comments  AM PM       AM PM       AM PM       AM PM       AM PM       AM PM       AM PM       AM PM       Total Daily amount of Acetaminophen Do not take more than  3,000 mg per day       Day 6    Time  Name of Medication Number of pills taken  Amount of  Acetaminophen  Pain Level  Comments  AM PM       AM PM       AM PM       AM PM       AM PM       AM PM       AM PM       AM PM       Total Daily amount of Acetaminophen Do not take more than  3,000 mg per day      Day 7    Time  Name of Medication Number of pills taken  Amount of Acetaminophen  Pain Level   Comments  AM PM       AM PM       AM PM       AM PM       AM PM       AM PM       AM PM       AM PM       Total Daily amount of Acetaminophen Do not take more than  3,000 mg per day        For additional information about how and where to safely dispose of unused opioid medications - [employer]  Disclaimer: This document contains information and/or instructional materials adapted from [place] for the typical patient with your condition. It does not replace medical advice from your health care provider because your experience may differ from that of the typical patient. Talk to your health care provider if you have any questions about this document, your condition or your treatment plan. Adapted  from [facility]

## 2018-07-30 ENCOUNTER — Other Ambulatory Visit: Payer: Self-pay | Admitting: Internal Medicine

## 2018-08-11 ENCOUNTER — Telehealth: Payer: Self-pay | Admitting: Neurology

## 2018-08-11 NOTE — Telephone Encounter (Signed)
FYI

## 2018-08-11 NOTE — Telephone Encounter (Signed)
Patient is scheduled for E-Visit on Friday but just wanted you to know that he is doing better except for his feet with the neuropathy. Just FYI for Friday. Thanks!

## 2018-08-13 NOTE — Progress Notes (Signed)
Virtual Visit via Video Note The purpose of this virtual visit is to provide medical care while limiting exposure to the novel coronavirus.    Consent was obtained for video visit:  Yes.   Answered questions that patient had about telehealth interaction:  Yes.   I discussed the limitations, risks, security and privacy concerns of performing an evaluation and management service by telemedicine. I also discussed with the patient that there may be a patient responsible charge related to this service. The patient expressed understanding and agreed to proceed.  Pt location: Home Physician Location: office Name of referring provider:  [name], MD I connected with Glen [name] at patients initiation/request on 08/14/2018 at  9:30 AM EDT by video enabled telemedicine application and verified that I am speaking with the correct person using two identifiers. Pt MRN:  [id number] Pt DOB:  07-22-1940 Video Participants:  Glen [name] Glen [name];     History of Present Illness:  Patient is seen today in follow-up for neuropathy and essential tremor.  Last visit, his neuropathy was becoming more bothersome.  We added gabapentin, 100 mg in the morning and 300 mg at night.  It is helping the "shart pains in the legs" but not the feet/toes at night.  He wears compression socks at night and it helps some.  We also discovered that his B12 is level and I recommended that he start B12, 1000 mcg daily.  Last visit, he also had a carotid bruit, which ultimately led to a carotid ultrasound.  The ultrasound demonstrated a 60 to 79% stenosis in the right internal carotid artery and a 1 to 39% in the left.  He subsequently had a CTA of the neck on April 27, 2018.  This demonstrated 65% stenosis in the right internal carotid artery.  There was severe proximal stenosis of the left external carotid artery, but no hemodynamically significant stenosis of the left internal carotid artery.  He did see vascular surgery.   They are following him up every 6 months.  Other records are also reviewed.  Patient was just in the hospital and discharged on July 25, 2018 for a left colectomy for what was ultimately determined to be adenocarcinoma of the colon.  Pt states that he doesn't think that any further intervention is required.  He overall feels very well.   Observations/Objective:   Vitals:   08/14/18 0823  BP: (!) 138/59  Pulse: 68  Temp: (!) 97.2 F (36.2 C)  Weight: 185 lb (83.9 kg)  Height: 5' 6.5" (1.689 m)    GEN:  The patient appears stated age and is in NAD.  Neurological examination:  Orientation: The patient is alert and oriented x3. Cranial nerves: There is good facial symmetry. There is no facial hypomimia.  The speech is fluent and clear. Soft palate rises symmetrically and there is no tongue deviation. Hearing is intact to conversational tone.  Lab Results  Component Value Date   HGBA1C 6.2 (H) 07/17/2018     Chemistry      Component Value Date/Time   NA 141 07/25/2018 0330   K 3.3 (L) 07/25/2018 0330   CL 105 07/25/2018 0330   CO2 29 07/25/2018 0330   BUN <5 (L) 07/25/2018 0330   CREATININE 0.81 07/25/2018 0330      Component Value Date/Time   CALCIUM 8.1 (L) 07/25/2018 0330   ALKPHOS 85 07/17/2018 0929   AST 19 07/17/2018 0929   ALT 14 07/17/2018 0929   BILITOT 0.6 07/17/2018 0929  Assessment and Plan:   1.  Peripheral neuropathy             -The gabapentin has helped some, but not enough.  He has no side effects.  We decided to try and push the dose up somewhat.  The biggest problem he has is when he sleeps at night.  He currently takes gabapentin, 100 mg in the morning and 300 mg at night.  I will have him now take gabapentin, 100 mg in the morning and 400 mg at night for a week and then 100 mg in the morning and 500 mg at night for a week and then finally gabapentin, 100 mg in the morning and 600 mg at night, as long as it is tolerated.  2.    Right carotid  stenosis  -He is now being followed by get vascular surgery.  CTA in December, 2019 with 65% stenosis in the right internal carotid artery.  3.    Recent partial colectomy due to partially obstructing mass  -Pathology just recently demonstrated adenocarcinoma of the colon.  Awaiting onc appt per pt  -Discussed in detail Covid-19 and risk factors for this, including age and recent CA diagnosis.  Discussed importance of social distancing.  Discussed importance of staying home at all times, as is feasible.  Discussed taking advantage of grocery store hours for the elderly.  Pt expressed understanding.  4.  B12 deficiency  -He is to remain on oral B12, 1000 mcg daily.  Follow Up Instructions:    -I discussed the assessment and treatment plan with the patient. The patient was provided an opportunity to ask questions and all were answered. The patient agreed with the plan and demonstrated an understanding of the instructions.   The patient was advised to call back or seek an in-person evaluation if the symptoms worsen or if the condition fails to improve as anticipated.    Total Time spent in visit with the patient was:  15 min, of which more than 50% of the time was spent in counseling and/or coordinating care on safety.   Pt understands and agrees with the plan of care outlined.     Alonza Bogus, DO

## 2018-08-14 ENCOUNTER — Other Ambulatory Visit: Payer: Self-pay

## 2018-08-14 ENCOUNTER — Telehealth (INDEPENDENT_AMBULATORY_CARE_PROVIDER_SITE_OTHER): Payer: Medicare Other | Admitting: Neurology

## 2018-08-14 DIAGNOSIS — G609 Hereditary and idiopathic neuropathy, unspecified: Secondary | ICD-10-CM

## 2018-08-14 DIAGNOSIS — E538 Deficiency of other specified B group vitamins: Secondary | ICD-10-CM | POA: Diagnosis not present

## 2018-08-14 DIAGNOSIS — C189 Malignant neoplasm of colon, unspecified: Secondary | ICD-10-CM

## 2018-08-14 MED ORDER — GABAPENTIN 300 MG PO CAPS: 600.0000 mg | ORAL_CAPSULE | Freq: Every day | ORAL | 1 refills | Status: DC

## 2018-08-14 NOTE — Patient Instructions (Signed)
Increase gabapentin as follows:  - gabapentin, 100 mg in the morning and 400 mg at night for a week and then 100 mg in the morning and 500 mg at night for a week and then finally gabapentin, 100 mg in the morning and 600 mg at night, as long as it is tolerated.  Let me know if you have any problems/side effects with the medication

## 2018-08-17 ENCOUNTER — Telehealth: Payer: Self-pay | Admitting: Neurology

## 2018-08-17 NOTE — Telephone Encounter (Signed)
Is it OK to e-mail pt's?  And how can I do this without sending from my e-mail? pls advise

## 2018-08-17 NOTE — Telephone Encounter (Signed)
Thank you.  Just let pt know that you mailed them.

## 2018-08-17 NOTE — Telephone Encounter (Signed)
Looks like I sent this to [place] last week asking her to email him.  It was my understanding that we could do that right now via regular email given relaxation of [employer].  I can see Caryl Pina printed out those instructions but maybe she just mailed them?  Hoyle Sauer, advise if we cannot do that.  Caryl Pina, advise on how you handled that as well.  thanks

## 2018-08-17 NOTE — Telephone Encounter (Signed)
I had to mail them.  I was told that we could not email.

## 2018-08-17 NOTE — Telephone Encounter (Signed)
Patient notified

## 2018-08-17 NOTE — Telephone Encounter (Signed)
Patient is calling in about the Gabapentin instructions to his email on file. He said from his Evisit last week he was told the instructions on how to take the medication would be emailed to him. Please send! Thanks!

## 2018-08-27 ENCOUNTER — Encounter

## 2018-08-27 ENCOUNTER — Ambulatory Visit: Payer: Medicare Other | Admitting: Neurology

## 2018-09-11 ENCOUNTER — Telehealth: Payer: Self-pay | Admitting: Oncology

## 2018-09-11 NOTE — Telephone Encounter (Signed)
Anew patient appt has been scheduled for the pt to see Leonette Nutting on 5/5 at 2pm. Pt has been cld and made aware of the appt date and time. Aware to arrive 15 minutes early.

## 2018-09-15 ENCOUNTER — Encounter: Payer: Self-pay | Admitting: Nurse Practitioner

## 2018-09-15 ENCOUNTER — Other Ambulatory Visit: Payer: Self-pay

## 2018-09-15 ENCOUNTER — Inpatient Hospital Stay: Payer: Medicare Other | Attending: Nurse Practitioner | Admitting: Nurse Practitioner

## 2018-09-15 ENCOUNTER — Ambulatory Visit (HOSPITAL_COMMUNITY)
Admission: RE | Admit: 2018-09-15 | Discharge: 2018-09-15 | Disposition: A | Discharge status: Home / Self Care | Payer: Medicare Other | Source: Ambulatory Visit | Attending: Nurse Practitioner | Admitting: Nurse Practitioner

## 2018-09-15 ENCOUNTER — Telehealth: Payer: Self-pay | Admitting: Nurse Practitioner

## 2018-09-15 VITALS — BP 125/60 | HR 87 | Temp 97.6°F | Resp 18 | Ht 66.5 in | Wt 199.6 lb

## 2018-09-15 DIAGNOSIS — C186 Malignant neoplasm of descending colon: Secondary | ICD-10-CM

## 2018-09-15 DIAGNOSIS — Z9049 Acquired absence of other specified parts of digestive tract: Secondary | ICD-10-CM

## 2018-09-15 DIAGNOSIS — Z86010 Personal history of colonic polyps: Secondary | ICD-10-CM | POA: Diagnosis not present

## 2018-09-15 DIAGNOSIS — K2270 Barrett's esophagus without dysplasia: Secondary | ICD-10-CM

## 2018-09-15 DIAGNOSIS — K219 Gastro-esophageal reflux disease without esophagitis: Secondary | ICD-10-CM | POA: Diagnosis not present

## 2018-09-15 DIAGNOSIS — J449 Chronic obstructive pulmonary disease, unspecified: Secondary | ICD-10-CM

## 2018-09-15 DIAGNOSIS — G629 Polyneuropathy, unspecified: Secondary | ICD-10-CM

## 2018-09-15 DIAGNOSIS — I10 Essential (primary) hypertension: Secondary | ICD-10-CM

## 2018-09-15 DIAGNOSIS — E538 Deficiency of other specified B group vitamins: Secondary | ICD-10-CM

## 2018-09-15 IMAGING — DX CHEST - 2 VIEW
2 series · 2 of 2 positions shown · non-contrast
Comparison: [DATE] radiograph, CT chest [DATE]

CLINICAL DATA: Recent diagnosis of colon cancer

EXAM:
CHEST - 2 VIEW

[chest pa]
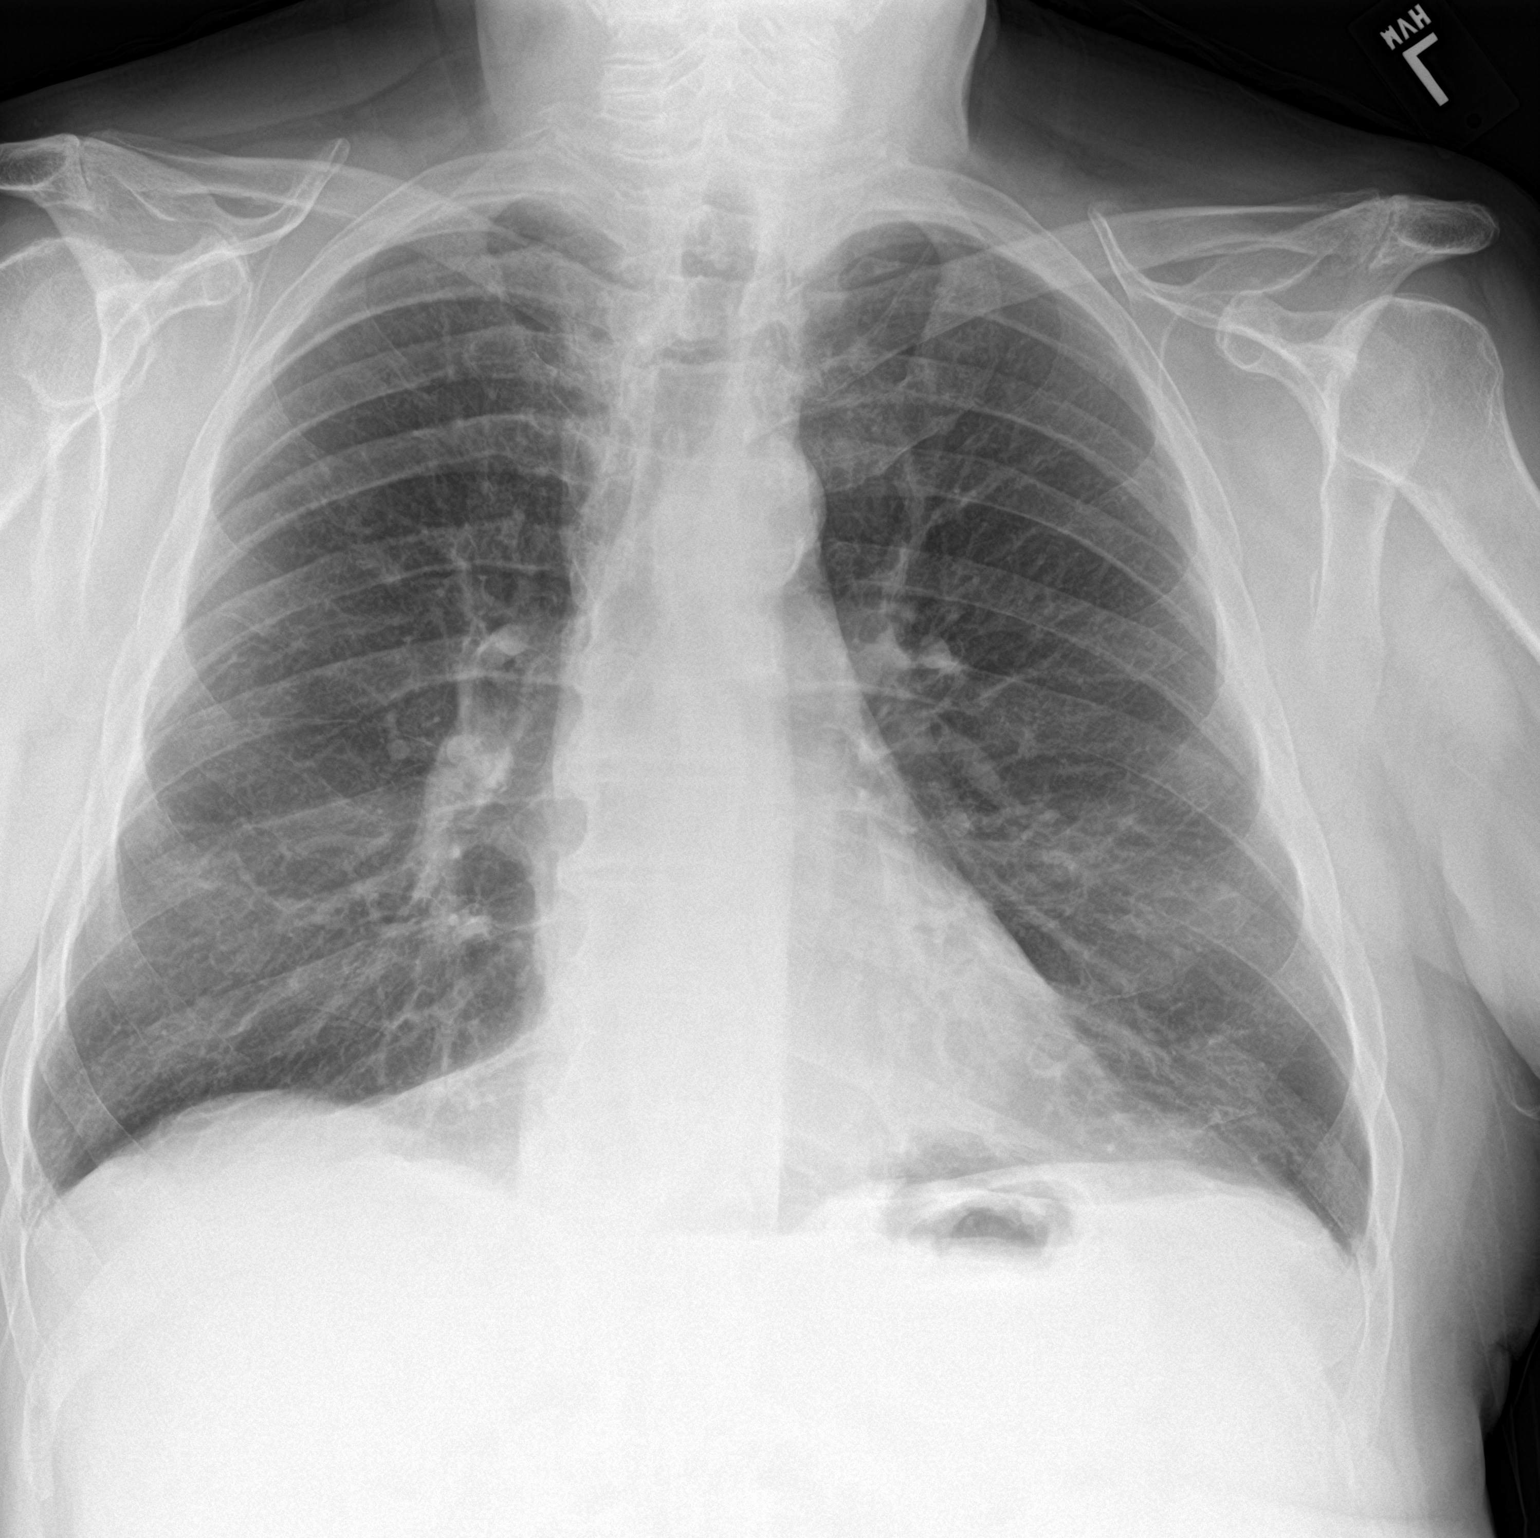

[chest lat]
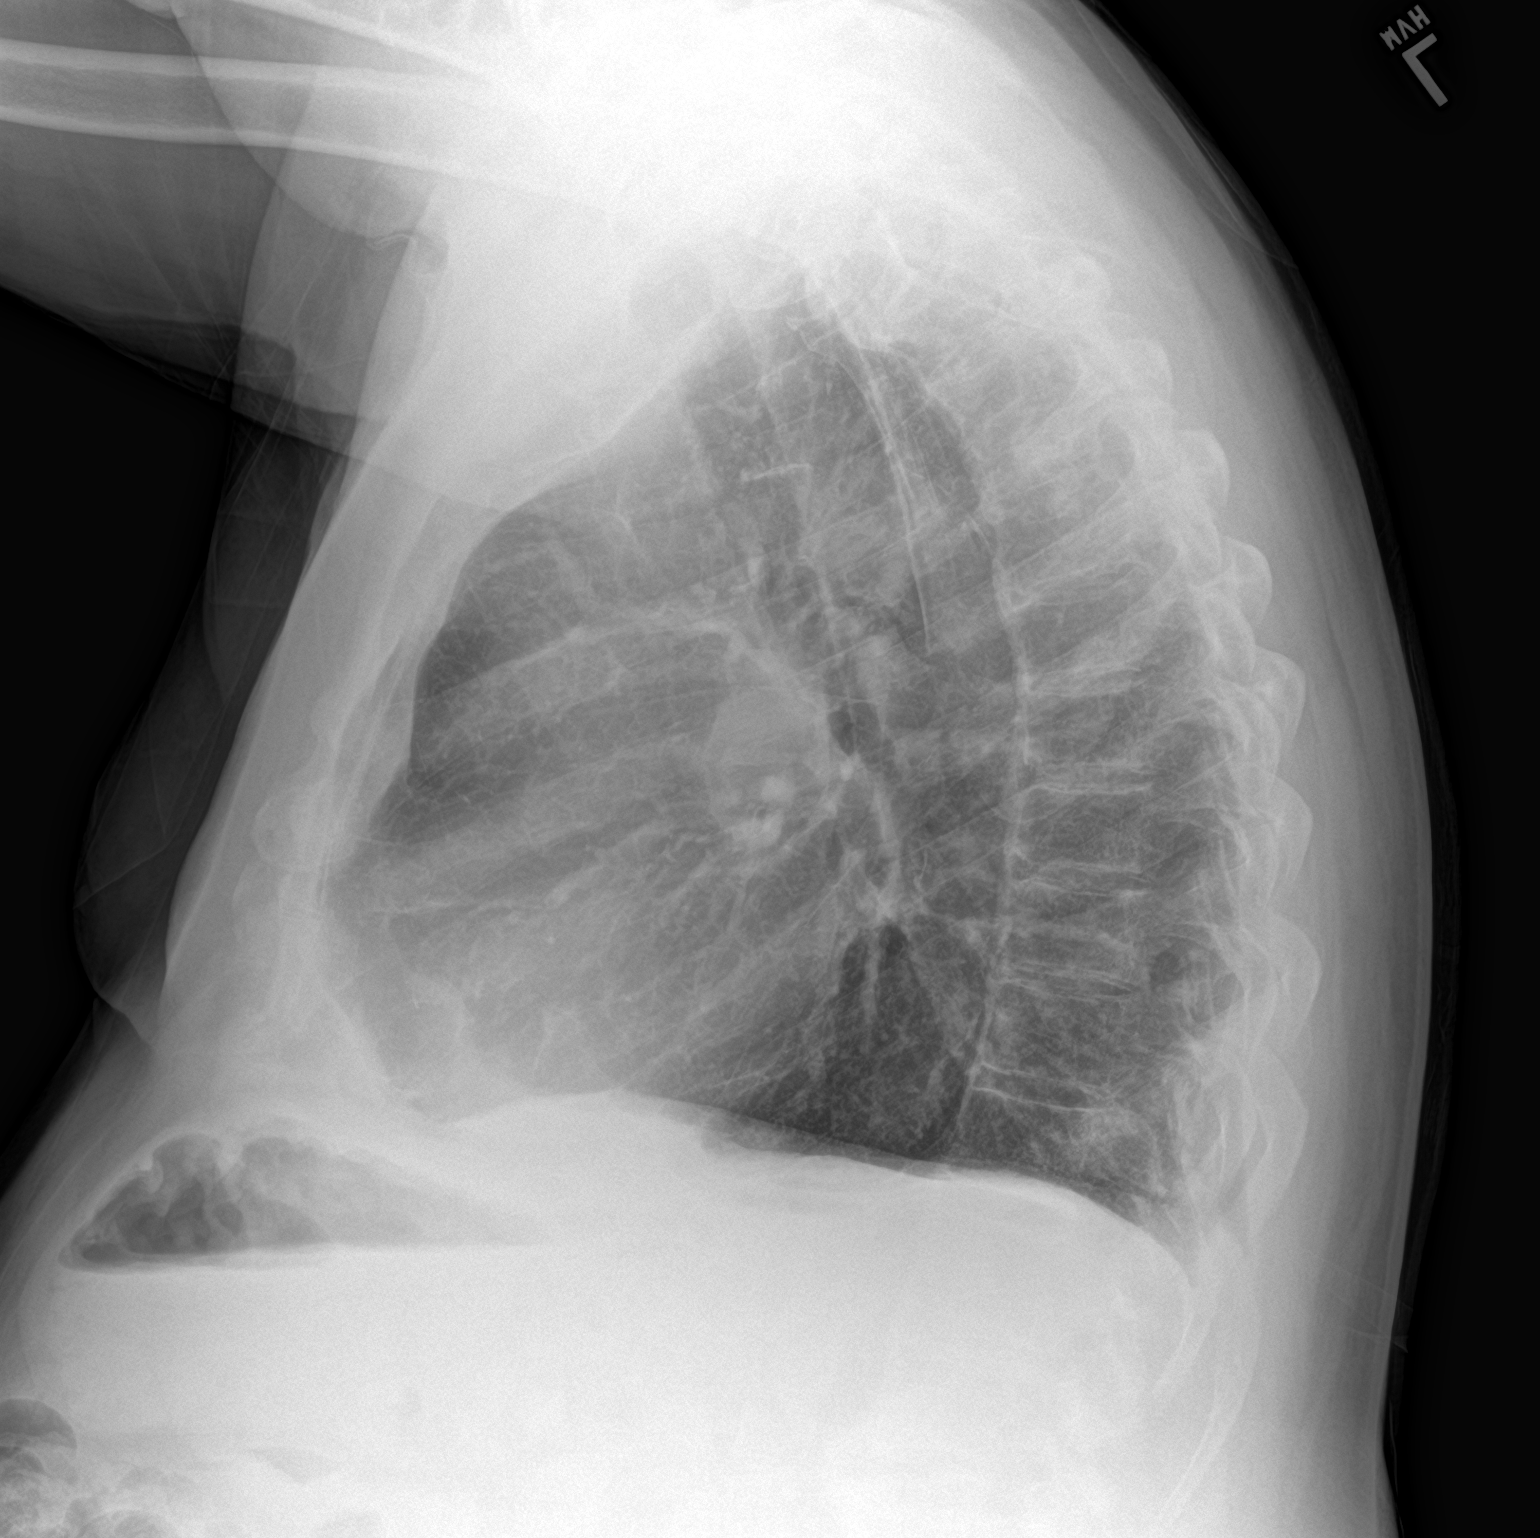

[2 of 2 positions shown; findings below may reference images not displayed]

FINDINGS: Hyperinflated lungs without focal opacity or pleural effusion. Mild
emphysematous disease. No nodules or masses. Normal heart size.
Aortic atherosclerosis. No pneumothorax.
IMPRESSION: No active cardiopulmonary disease. Hyperinflation with emphysematous
disease.

## 2018-09-15 NOTE — Progress Notes (Addendum)
New Hematology/Oncology Consult   Requesting MD: Dr. Dorthy Cooler  [phone]      Reason for Consult: Colon cancer  HPI: Glen [name] is a 78 year old man who initially noted a change in bowel habits in November 2019 with stool becoming very "small".  He underwent a colonoscopy by Dr. Oletta Lamas on 06/22/2018.  A polypoid completely obstructing large mass was found in the proximal sigmoid colon.  Pathology showed fragments of ulcer and inflammatory debris; no viable tissue present.  CT abdomen/pelvis 06/30/2018 showed severe descending and sigmoid diverticulosis.  There was a focal dilatation of the mid descending colon to approximately 8.5 x 6.2 cm.  There was no overt exophytic mass or mesenteric lymphadenopathy.  No evidence of abdominal metastatic disease.  On 07/21/2018 he was taken to the OR by Dr. Dalbert Batman and underwent left colectomy with primary anastomosis, primary repair of umbilical hernia.  Pathology showed invasive moderately differentiated adenocarcinoma with mucinous features measuring 7.5 cm.  There was circumferential involvement of the descending colon with luminal obstruction.  Carcinoma focally invaded into the pericolonic soft tissue.  Margins negative.  No lymphovascular or perineural invasion.  21 lymph nodes negative for cancer; loss of expression MLH1 and PMS2; MSI high.     Past Medical History:  Diagnosis Date  . Allergic rhinitis, cause unspecified   . Anxiety   . Asthma   . COPD (chronic obstructive pulmonary disease) ([place])   . Depression   . Dyspnea    upon exertion  . GERD (gastroesophageal reflux disease)   . Hypertension   . PUD (peptic ulcer disease)    avoids aspirin  :   Past Surgical History:  Procedure Laterality Date  . CATARACT EXTRACTION, BILATERAL    . COLONOSCOPY    . LAPAROSCOPIC LYSIS OF ADHESIONS N/A 07/21/2018   Procedure: Laparoscopic Lysis Of Adhesions;  Surgeon: Fanny Skates, MD;  Location: [place];  Service: General;  Laterality: N/A;  .  LAPAROSCOPIC PARTIAL COLECTOMY Left 07/21/2018   Procedure: LAPARASCOPIC TAKEDOWN OF SPLENIC FLEXURE; OPEN LEFT COLETCTOMY WITH PRIMARY ANASTOMOSIS;  Surgeon: Fanny Skates, MD;  Location: [place];  Service: General;  Laterality: Left;  . NASAL SEPTUM SURGERY  1960's  . TONSILLECTOMY    . UMBILICAL HERNIA REPAIR N/A 07/21/2018   Procedure: PRIMARY REPAIR OF UMBILICAL HERNIA , ADULT;  Surgeon: Fanny Skates, MD;  Location: [place];  Service: General;  Laterality: N/A;  :   Current Outpatient Medications:  .  acetaminophen (TYLENOL) 650 MG CR tablet, Take 1,950 mg by mouth at bedtime., Disp: , Rfl:  .  ALPRAZolam (XANAX) 0.25 MG tablet, Take 0.25 mg by mouth daily as needed for anxiety. , Disp: , Rfl:  .  amLODipine (NORVASC) 10 MG tablet, Take 10 mg by mouth every evening. , Disp: , Rfl:  .  aspirin EC 81 MG tablet, Take 81 mg by mouth at bedtime. , Disp: , Rfl:  .  atorvastatin (LIPITOR) 40 MG tablet, Take 40 mg by mouth at bedtime. , Disp: , Rfl:  .  diphenhydrAMINE (BENADRYL) 25 MG tablet, Take 75 mg by mouth at bedtime. , Disp: , Rfl:  .  gabapentin (NEURONTIN) 100 MG capsule, 1 in the morning, 3 in the evening (Patient taking differently: Take 100-300 mg by mouth See admin instructions. Take 100 mg in the morning and 300 mg in the evening), Disp: 120 capsule, Rfl: 5 .  gabapentin (NEURONTIN) 300 MG capsule, Take 2 capsules (600 mg total) by mouth at bedtime., Disp: 180 capsule, Rfl: 1 .  hydrochlorothiazide (MICROZIDE) 12.5 MG capsule, Take 12.5 mg by mouth daily. , Disp: , Rfl:  .  levalbuterol (XOPENEX) 0.63 MG/3ML nebulizer solution, USE 1 VIAL VIA NEBULIZER EVERY 4 HOURS AS NEEDED FOR WHEEZING OR SHORTNESS OF BREATH (Patient taking differently: Take 0.63 mg by nebulization every 4 (four) hours as needed for wheezing or shortness of breath. ), Disp: 1650 mL, Rfl: 1 .  omeprazole (PRILOSEC) 20 MG capsule, Take 20 mg by mouth daily.  , Disp: , Rfl:  .  PROAIR HFA 108 (90 Base) MCG/ACT  inhaler, INHALE 2 PUFFS INTO THE LUNGS EVERY 4 HOURS AS NEEDED FOR WHEEZING OR SHORTNESS OF BREATH. (Patient taking differently: Inhale 2 puffs into the lungs every 4 (four) hours as needed for wheezing or shortness of breath. ), Disp: 8.5 g, Rfl: 0 .  sertraline (ZOLOFT) 100 MG tablet, Take 100 mg by mouth daily. , Disp: , Rfl: 4 .  sodium chloride (OCEAN) 0.65 % SOLN nasal spray, Place 1 spray into both nostrils as needed for congestion., Disp: , Rfl:  .  telmisartan (MICARDIS) 40 MG tablet, Take 40 mg by mouth daily., Disp: , Rfl:  .  TRELEGY ELLIPTA 100-62.5-25 MCG/INH AEPB, INHALE 1 PUFF INTO THE LUNGS DAILY (Patient taking differently: Inhale 1 puff into the lungs daily. ), Disp: 180 each, Rfl: 1 .  vitamin B-12 (CYANOCOBALAMIN) 1000 MCG tablet, Take 1,000 mcg by mouth daily., Disp: , Rfl: :  :   Allergies  Allergen Reactions  . Aspirin Other (See Comments)    pt has bleeding ulcers  . Lisinopril Cough  :  FH: Mother deceased age 35 history of breast cancer; father deceased suicide; sister age 75 with no history of cancer.  SOCIAL HISTORY: Glen [name] lives in [place].  He is married.  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  He quit smoking about 10 years ago.  Prior to colon surgery he had a glass of wine 2 or 3 times a week.  Review of Systems: He had a change in bowel habits in November 2019 with stools becoming very "small".  Since surgery bowels have been moving regularly.  He intermittently notes bleeding related to hemorrhoids.  He denies abdominal pain.  He has pain related to arthritis in the shoulders, knees, back and wrists.  No fevers or sweats.  He reports a good appetite.  No unusual headaches or vision change.  He has shortness of breath which he relates to COPD.  No cough.  No dysphagia.  He denies chest pain.  No leg swelling.  He has neuropathy affecting both feet characterized by numbness, tingling and some pain.  Physical Exam:  Blood pressure  125/60, pulse 87, temperature 97.6 F (36.4 C), temperature source Oral, resp. rate 18, height 5' 6.5" (1.689 m), weight 199 lb 9.6 oz (90.5 kg), SpO2 96 %.  Abdomen: Healed midline incision.  No hepatomegaly. Vascular: No leg edema. Lymph nodes: No palpable cervical, supraclavicular, axillary or inguinal lymph nodes.   LABS: 07/17/2018 CEA 1.8  RADIOLOGY:  No results found.  Assessment:   1. Colon cancer   Colonoscopy by Dr. Oletta Lamas 06/22/2018-polypoid completely obstructing large mass was found in the proximal sigmoid colon.  Pathology showed fragments of ulcer and inflammatory debris; no viable tissue present.    CT abdomen/pelvis 06/30/2018-severe descending and sigmoid diverticulosis.  There was a focal dilatation of the mid descending colon to approximately 8.5 x 6.2 cm.  There was no overt exophytic mass or mesenteric  lymphadenopathy.  No evidence of abdominal metastatic disease.    07/21/2018 open left colectomy with primary anastomosis, primary repair of umbilical hernia by Dr. Dalbert Batman.  Pathology showed invasive moderately differentiated adenocarcinoma with mucinous features measuring 7.5 cm.  There was circumferential involvement of the descending colon with luminal obstruction.  Carcinoma focally invaded into the pericolonic soft tissue.  Margins negative.  No lymphovascular or perineural invasion.  21 lymph nodes negative for cancer; loss of expression MLH1 and PMS2; MSI high. 2. Upper endoscopy 06/22/2018-esophageal mucosal changes suggestive of long segment Barrett's esophagus.  A few gastric polyps.  Normal examined duodenum.  GERD.  Stomach biopsy-fundic gland polyp.  Negative for dysplasia.  Esophagus biopsy-Barrett's mucosa.  Negative for dysplasia.   3. COPD 4. Hypertension 5. B12 deficiency 6. Peripheral neuropathy  Disposition: Mr. Donaway has been diagnosed with stage II colon cancer.  Dr. Benay Spice reviewed the diagnosis and prognosis with him at today's visit.  He has a  good prognosis for long-term disease-free survival.  Dr. Benay Spice does not recommend adjuvant chemotherapy in his case.  We reviewed the surgical pathology report with him at today's visit.  We discussed the loss of expression of MLH1 and PMS2. We discussed the MSI high result.  We will request MLH1 methylation testing.  We are referring him for a staging chest x-ray.  He will follow-up with Dr. Oletta Lamas for a completion colonoscopy.  His case will be presented at an upcoming GI tumor conference.  He will return for a CEA and follow-up visit in 6 months.  Patient seen with Dr. Benay Spice.  CT images reviewed with Mr. Altland on the computer.  60 minutes were spent face-to-face at today's visit with the majority of that time involved in counseling/coordination of care.   Ned Card, NP 09/15/2018, 2:37 PM   This was a shared visit with Ned Card.  Mr. Luckett was interviewed and examined.  We reviewed the preoperative CT, operative note, and details of the surgery pathology report.  He has been diagnosed with early stage II colon cancer.  He has a good prognosis for a long-term disease-free survival.  I do not recommend adjuvant chemotherapy. The tumor returned MSI high with loss of MLH1 and PMS2.  This pattern is most often seen in sporadic tumors.  We will request MLH1 methylation testing for confirmation.  He most likely does not have a hereditary colon cancer syndrome, but his family members are at increased risk of developing colon cancer and should receive appropriate screening.  We discussed diet and exercise maneuvers that may decrease the risk of developing colon cancer. He will see Dr. Oletta Lamas for a completion colonoscopy.  He will return for an office visit and CEA in 6 months.  I will present his case at the GI tumor conference.  Julieanne Manson, MD

## 2018-09-15 NOTE — Telephone Encounter (Signed)
Scheduled appt per 5/5 los. ° °A calendar will be mailed out. °

## 2018-09-16 ENCOUNTER — Telehealth: Payer: Self-pay

## 2018-09-16 NOTE — Telephone Encounter (Signed)
-----   Message from Owens Shark, NP sent at 09/16/2018  3:56 PM EDT ----- Please let him know the chest x-ray is negative.

## 2018-09-16 NOTE — Telephone Encounter (Signed)
Spoke with pt. Advised per Ned Card NP chest x-ray was negative. Pt verbalized understanding.

## 2018-10-03 ENCOUNTER — Other Ambulatory Visit: Payer: Self-pay | Admitting: Neurology

## 2018-10-06 NOTE — Telephone Encounter (Signed)
Requested Prescriptions   Pending Prescriptions Disp Refills  . gabapentin (NEURONTIN) 100 MG capsule [Pharmacy Med Name: GABAPENTIN 100MG  CAPSULES] 120 capsule 5    Sig: TAKE 1 CAPSULE BY MOUTH IN THE MORNING AND 3 CAPSULES IN THE EVENING   Rx last filled:04/03/18 #120 5 REFILLL  Pt last seen:08/14/18  Assessment and Plan:   1.Peripheral neuropathy -The gabapentin has helped some, but not enough.  He has no side effects.  We decided to try and push the dose up somewhat.  The biggest problem he has is when he sleeps at night.  He currently takes gabapentin, 100 mg in the morning and 300 mg at night.  I will have him now take gabapentin, 100 mg in the morning and 400 mg at night for a week and then 100 mg in the morning and 500 mg at night for a week and then finally gabapentin, 100 mg in the morning and 600 mg at night, as long as it is tolerated.  Follow up appt scheduled:02/16/19

## 2018-10-27 ENCOUNTER — Ambulatory Visit: Payer: Medicare Other | Admitting: Internal Medicine

## 2018-12-25 ENCOUNTER — Other Ambulatory Visit: Payer: Self-pay | Admitting: Internal Medicine

## 2019-01-24 ENCOUNTER — Encounter: Payer: Self-pay | Admitting: Internal Medicine

## 2019-01-25 ENCOUNTER — Other Ambulatory Visit: Payer: Self-pay

## 2019-01-25 ENCOUNTER — Ambulatory Visit: Payer: Medicare Other | Admitting: Internal Medicine

## 2019-01-25 ENCOUNTER — Encounter: Payer: Self-pay | Admitting: Internal Medicine

## 2019-01-25 DIAGNOSIS — J449 Chronic obstructive pulmonary disease, unspecified: Secondary | ICD-10-CM | POA: Diagnosis not present

## 2019-01-25 DIAGNOSIS — G4733 Obstructive sleep apnea (adult) (pediatric): Secondary | ICD-10-CM

## 2019-01-25 NOTE — Assessment & Plan Note (Signed)
No recent exacerbation. Got through abd surgery/ GOT without respiratory problems that he is aware of. Plan- continue current meds

## 2019-01-25 NOTE — Assessment & Plan Note (Signed)
Continues to benefit Satisfactory compliance and control Plan- continue CPAP auto 5-20

## 2019-01-25 NOTE — Progress Notes (Signed)
Subjective:    Patient ID: Glen [name], male    DOB: 1941/04/04, 78 y.o.   MRN: [id number]  HPI male former smoker followed for COPD,OSA,  rhinitis, history urticaria, complicated by HBP, Colon cancer/ Left colectomy PFT-02/26/2013-severe obstructive airways disease with response to bronchodilator, air trapping, normal diffusion. FEV1/FVC 0.52, TLC 93%, DLCO 87 Walk Test 06/07/2016-Baseline 94% desaturated only to 92% on room air CT chest 12/22/2015-1.2 cm right upper lobe groundglass pulmonary nodule-----Resolved CT 06/15/16 ONOX room air 07/02/16- recorded 3 minutes with saturation less than or equal to 88%. HST 12/17/2016-AHI 7.9/hour, desaturation to 81%, body weight 210 pounds Walk Test 09/08/2017-3 laps/ 555 feet, lowest saturation 90%. ------------------------------------------------------------------  04/27/2018 - 78 year old male former smoker followed for COPD, lung nodule,OSA,  rhinitis, history urticaria, complicated by HBP CPAP auto 5-20/ [facility] -----OSA: DME [facility]. Pt wears CPAP nightly.  Neb DuoNeb, pro-air HFA, Trelegy, Download 100% compliance AHI 1.4/hour.  He put CPAP on even for naps.  He and wife use separate bedrooms but he is not aware of snoring through or movement disturbance. No recent respiratory exacerbation.  Dyspnea does interfere with his banjo playing trips.  Nebulizer treatments with albuterol make his tremor worse so we discussed trying Xopenex.  01/25/2019- 78 year old male former smoker followed for COPD, lung nodule,OSA,  rhinitis, history urticaria, complicated by HBP CPAP auto 5-20/ [facility] Download compliance 83%, AHI 1.7/ hr Body weight today 201 lbs Hosp 3/10-3/14- Left colectomy for adenocarcinoma  -----OSA on CPAP, DME: [facility]; no complaints, pt states breathing is at baseline Comfortable with CPAP. Martin Majestic out of town to a banjo event where there was no electricity and did w/o CPAP a few days- discussed. Felt ok. Had f;u vax Daily Trelegy w/o  need for neb andd rarely needs rescue inhaler. CXR 09/15/18- No active cardiopulmonary disease. Hyperinflation with emphysematous disease.  ROS-see HPI  + = positive Constitutional:   No-   weight loss, night sweats, fevers, chills, +fatigue, lassitude. HEENT:   No-  headaches, difficulty swallowing, tooth/dental problems, sore throat,       No-  sneezing, itching, ear ache, nasal congestion, post nasal drip,  CV:  No-   chest pain, orthopnea, PND, swelling in lower extremities, anasarca, dizziness, palpitations Resp: +  shortness of breath with exertion or at rest.          productive cough,   non-productive cough,  No- coughing up of blood.              No-   change in color of mucus.  No- wheezing.   Skin: No-   rash or lesions. GI:  No-   heartburn, indigestion, abdominal pain, nausea, vomiting,  GU: . MS:  No-   joint pain or swelling.  . Neuro-     nothing unusual Psych:  No- change in mood or affect. + depression or anxiety.  No memory loss.  OBJ- Physical Exam General- Alert, Oriented, Affect-appropriate, Distress- none acute, + obese Skin- rash-none, lesions- none, excoriation- none Lymphadenopathy- none Head- atraumatic            Eyes- Gross vision intact, PERRLA, conjunctivae and secretions clear            Ears- Hearing, canals-normal            Nose- Clear, no-Septal dev, mucus, polyps, erosion, perforation             Throat- Mallampati II , mucosa clear , drainage- none, tonsils- atrophic Neck- flexible , trachea midline, no stridor ,  thyroid nl, carotid no bruit Chest - symmetrical excursion , unlabored           Heart/CV- RRR , no murmur , no gallop  , no rub, nl s1 s2                           - JVD- none , edema- none, stasis changes- none, varices- none           Lung- +quiet, distant, wheeze- none, cough- none , dullness-none, rub- none           Chest wall-  Abd-  Br/ Gen/ Rectal- Not done, not indicated Extrem- cyanosis- none, clubbing, none, atrophy- none,  strength- nl Neuro- grossly intact to observation Assessment & Plan:

## 2019-01-25 NOTE — Patient Instructions (Signed)
We can continue CPAP auto 5-20, mask of choice, humidifier, supplies, AirView/ card  Ok to continue present meds  Please call if we can help

## 2019-02-03 ENCOUNTER — Other Ambulatory Visit: Payer: Self-pay

## 2019-02-03 MED ORDER — GABAPENTIN 300 MG PO CAPS: 600.0000 mg | ORAL_CAPSULE | Freq: Every day | ORAL | 1 refills | Status: DC

## 2019-02-03 NOTE — Telephone Encounter (Signed)
Requested Prescriptions   Pending Prescriptions Disp Refills  . gabapentin (NEURONTIN) 300 MG capsule 180 capsule 1    Sig: Take 2 capsules (600 mg total) by mouth at bedtime.   Rx last filled: 09/10/18 #180 1 refills  Pt last seen: 09/10/18  Follow up appt scheduled: 02/16/19

## 2019-02-08 ENCOUNTER — Other Ambulatory Visit: Payer: Self-pay

## 2019-02-08 DIAGNOSIS — I6521 Occlusion and stenosis of right carotid artery: Secondary | ICD-10-CM

## 2019-02-09 ENCOUNTER — Telehealth (HOSPITAL_COMMUNITY): Payer: Self-pay

## 2019-02-09 NOTE — Telephone Encounter (Signed)

## 2019-02-10 ENCOUNTER — Other Ambulatory Visit: Payer: Self-pay

## 2019-02-10 ENCOUNTER — Encounter: Payer: Self-pay | Admitting: Vascular Surgery

## 2019-02-10 ENCOUNTER — Ambulatory Visit (HOSPITAL_COMMUNITY)
Admission: RE | Admit: 2019-02-10 | Discharge: 2019-02-10 | Disposition: A | Discharge status: Home / Self Care | Payer: Medicare Other | Source: Ambulatory Visit | Attending: Vascular Surgery | Admitting: Vascular Surgery

## 2019-02-10 ENCOUNTER — Ambulatory Visit (INDEPENDENT_AMBULATORY_CARE_PROVIDER_SITE_OTHER): Payer: Medicare Other | Admitting: Vascular Surgery

## 2019-02-10 VITALS — BP 108/54 | HR 74 | Temp 97.8°F | Resp 20 | Ht 66.0 in | Wt 197.0 lb

## 2019-02-10 DIAGNOSIS — I6521 Occlusion and stenosis of right carotid artery: Secondary | ICD-10-CM | POA: Diagnosis not present

## 2019-02-10 NOTE — Progress Notes (Signed)
Patient name: Glen [name] MRN: [id number] DOB: 1940/11/11 Sex: male  REASON FOR VISIT:   Follow-up of right carotid stenosis  HPI:   Glen [name] is a pleasant 78 y.o. male who I saw in consultation on 04/29/2018 with a right carotid stenosis.  Duplex scan showed a 60 to 79% right carotid stenosis and this was confirmed on a CT scan.  Of note, the CT scan suggested that the carotid bifurcation was high.  The patient was a symptomatic.  He comes in for follow-up 6 months study.  Since I saw him last his main complaints relate to his arthritis.  He has arthritis in his back, both knees, and left wrist.  He denies any focal weakness or paresthesias.  He is had no expressive or receptive aphasia.  Of note he is left-handed.  He is on aspirin and is on a statin.  He did have a colon resection in March of this year for colon cancer.  Past Medical History:  Diagnosis Date  . Allergic rhinitis, cause unspecified   . Anxiety   . Asthma   . Cancer ([place]) 07/21/2018   colon  . COPD (chronic obstructive pulmonary disease) ([place])   . Depression   . Dyspnea    upon exertion  . GERD (gastroesophageal reflux disease)   . Hypertension   . PUD (peptic ulcer disease)    avoids aspirin  . Sleep apnea    uses C-PAP    Family History  Problem Relation Age of Onset  . Alcohol abuse Father        commited suicide  . Heart disease Father        had bypass  . Breast cancer Mother   . Prostate cancer Maternal Uncle        several     SOCIAL HISTORY: Social History   Tobacco Use  . Smoking status: Former Smoker    Packs/day: 0.50    Types: Cigarettes    Quit date: 04/12/2011    Years since quitting: 7.8  . Smokeless tobacco: Never Used  Substance Use Topics  . Alcohol use: Yes    Alcohol/week: 0.0 - 6.0 standard drinks    Comment: 1 liquor drink, 1 glass wine daily    Allergies  Allergen Reactions  . Aspirin Other (See Comments)    pt has bleeding ulcers  . Lisinopril  Cough    Current Outpatient Medications  Medication Sig Dispense Refill  . acetaminophen (TYLENOL) 650 MG CR tablet Take 1,950 mg by mouth at bedtime.    . ALPRAZolam (XANAX) 0.25 MG tablet Take 0.25 mg by mouth daily as needed for anxiety.     [name] amLODipine (NORVASC) 10 MG tablet Take 10 mg by mouth every evening.     [name] aspirin EC 81 MG tablet Take 81 mg by mouth at bedtime.     [name] atorvastatin (LIPITOR) 40 MG tablet Take 40 mg by mouth at bedtime.     . diphenhydrAMINE (BENADRYL) 25 MG tablet Take 75 mg by mouth at bedtime.     . gabapentin (NEURONTIN) 100 MG capsule TAKE 1 CAPSULE BY MOUTH IN THE MORNING AND 3 CAPSULES IN THE EVENING (Patient taking differently: 100 mg. TAKE 1 CAPSULE BY MOUTH) 120 capsule 5  . gabapentin (NEURONTIN) 300 MG capsule Take 2 capsules (600 mg total) by mouth at bedtime. 180 capsule 1  . hydrochlorothiazide (MICROZIDE) 12.5 MG capsule Take 12.5 mg by mouth daily.     [name] levalbuterol (XOPENEX)  0.63 MG/3ML nebulizer solution USE 1 VIAL VIA NEBULIZER EVERY 4 HOURS AS NEEDED FOR WHEEZING OR SHORTNESS OF BREATH 1650 mL 1  . omeprazole (PRILOSEC) 20 MG capsule Take 20 mg by mouth daily.      [name] PROAIR HFA 108 (90 Base) MCG/ACT inhaler INHALE 2 PUFFS INTO THE LUNGS EVERY 4 HOURS AS NEEDED FOR WHEEZING OR SHORTNESS OF BREATH. (Patient taking differently: Inhale 2 puffs into the lungs every 4 (four) hours as needed for wheezing or shortness of breath. ) 8.5 g 0  . sertraline (ZOLOFT) 100 MG tablet Take 100 mg by mouth daily.   4  . telmisartan (MICARDIS) 40 MG tablet Take 40 mg by mouth daily.    . TRELEGY ELLIPTA 100-62.5-25 MCG/INH AEPB INHALE 1 PUFF INTO THE LUNGS DAILY 180 each 1  . vitamin B-12 (CYANOCOBALAMIN) 1000 MCG tablet Take 1,000 mcg by mouth daily.     No current facility-administered medications for this visit.     REVIEW OF SYSTEMS:  [X]  denotes positive finding, [ ]  denotes negative finding Cardiac  Comments:  Chest pain or chest pressure:    Shortness  of breath upon exertion: x   Short of breath when lying flat:    Irregular heart rhythm:        Vascular    Pain in calf, thigh, or hip brought on by ambulation: x   Pain in feet at night that wakes you up from your sleep:     Blood clot in your veins:    Leg swelling:         Pulmonary    Oxygen at home:    Productive cough:     Wheezing:         Neurologic    Sudden weakness in arms or legs:     Sudden numbness in arms or legs:     Sudden onset of difficulty speaking or slurred speech:    Temporary loss of vision in one eye:     Problems with dizziness:         Gastrointestinal    Blood in stool:     Vomited blood:         Genitourinary    Burning when urinating:     Blood in urine:        Psychiatric    Major depression:         Hematologic    Bleeding problems:    Problems with blood clotting too easily:        Skin    Rashes or ulcers:        Constitutional    Fever or chills:     PHYSICAL EXAM:   Vitals:   02/10/19 0941 02/10/19 0944  BP: (!) 116/58 (!) 108/54  Pulse: 74   Resp: 20   Temp: 97.8 F (36.6 C)   SpO2: 95%   Weight: 197 lb (89.4 kg)   Height: 5\' 6"  (1.676 m)     GENERAL: The patient is a well-nourished male, in no acute distress. The vital signs are documented above. CARDIAC: There is a regular rate and rhythm.  VASCULAR: I do not detect carotid bruits. He has palpable dorsalis pedis and posterior tibial pulses bilaterally. He has no significant lower extremity swelling. PULMONARY: There is good air exchange bilaterally without wheezing or rales. ABDOMEN: Soft and non-tender with normal pitched bowel sounds.  MUSCULOSKELETAL: There are no major deformities or cyanosis. NEUROLOGIC: No focal weakness or paresthesias are detected. SKIN: There are  no ulcers or rashes noted. PSYCHIATRIC: The patient has a normal affect.  DATA:    CAROTID DUPLEX: I have independently interpreted his carotid duplex scan.  On the right side there is a  60 to 79% carotid stenosis.  On the left side there is a less than 39% stenosis.  Both vertebral arteries are patent with antegrade flow.  MEDICAL ISSUES:   ASYMPTOMATIC 60 TO 79% RIGHT CAROTID STENOSIS: This patient has an asymptomatic 60 to 79% right carotid stenosis.  He understands we would not consider carotid endarterectomy unless the stenosis progressed to greater than 80% or he develop new right hemispheric symptoms.  He is on aspirin and is on a statin.  I have ordered a follow-up carotid duplex scan in 6 months and I will see him back at that time.  He knows to call sooner if he has problems.  He is being considered for knee replacements and is scheduled for MRI.  From a vascular standpoint he does have palpable pulses in his feet so I do not see any contraindication to that if he does require surgery.  Deitra Mayo [facility] [phone]

## 2019-02-12 NOTE — Progress Notes (Signed)
Subjective:   Glen [name] was seen in consultation in the movement disorder clinic at the request of Koirala, Dibas, MD.  The evaluation is for tremor.  The records that were made available to me were reviewed.  Pt reports that he has had tremor since he was a child.  States that he remembers it with stress.  It has gotten a little worse with time.  Both hands are the same.  He is L hand dominant.  Mom and tremor and so did dad but "his was alcohol induced."   Affected by caffeine:  Doesn't drink enough to know Affected by alcohol:  No. (drinks wine and crown with dinner) Affected by stress:  Yes.   (states that he had had a "melt down" on memorial day weekend and started seeing psychology - been on zoloft and xanax for years - never takes more than 1 xanax a day) Affected by fatigue:  Yes.   Spills soup if on spoon:  Yes.   Spills glass of liquid if full:  No. Affects ADL's (tying shoes, brushing teeth, etc):  No.  Current/Previously tried tremor medications: xanax  Current medications that may exacerbate tremor:  Spiriva/ventolin/advair  Outside reports reviewed: lab reports and office notes.  04/03/18 update: Patient is seen today in follow-up, but this time requests to be seen for neuropathy.  I have not seen the patient in 2-1/2 years.  When I saw him then it was for essential tremor and he opted to hold off on any medication at that point in time.  Tremor waxes and wanes.  Sometimes his wife has to feed him.   I do have some records from his primary care physician, the most recent of which was dated December 26, 2017.  The note did mention stable tremor.  It also noted that the patient had some degree of peripheral neuropathy.  However, at that point in time, it was reported to be stable and they opted to hold on doing a further evaluation, with the exception of doing a hemoglobin A1c which was 6.4 and a B12 which was 143.   He is taking 1071mcg every other day since about July.   Pt  states "my toes are numb and cold."  States that he has stabbing pain in calves and thighs.  He is seeing Dr. Nelva Bush for shots in "back and knees."  02/16/19 update: Patient seen today in follow-up for his peripheral neuropathy.  Last visit, we increased his gabapentin so that he was taking 100 mg in the morning and 600 mg at night.  He states that it feels better overall except when he first lays down and "my feet are so cold."  Had one fall - slipped on ramp and fell.  Didn't get hurt.    Medical records have been reviewed since our last visit.  He was diagnosed with colon cancer, stage II.  Chemotherapy was not recommended.  He saw vascular surgery on February 10, 2019 for his history of carotid stenosis.  He has a 60 to 79% stenosis on the right and less than 39% stenosis on the left.  Patient complains of worsening tremor.  Having difficult time writing now.  Thinks that he would like to try some medication.  Allergies  Allergen Reactions   Aspirin Other (See Comments)    pt has bleeding ulcers   Lisinopril Cough    Outpatient Encounter Medications as of 02/16/2019  Medication Sig   acetaminophen (TYLENOL) 650 MG CR tablet  Take 1,950 mg by mouth at bedtime.   ALPRAZolam (XANAX) 0.25 MG tablet Take 0.25 mg by mouth daily as needed for anxiety.    amLODipine (NORVASC) 10 MG tablet Take 10 mg by mouth every evening.    aspirin EC 81 MG tablet Take 81 mg by mouth at bedtime.    atorvastatin (LIPITOR) 40 MG tablet Take 40 mg by mouth at bedtime.    diphenhydrAMINE (BENADRYL) 25 MG tablet Take 75 mg by mouth at bedtime.    gabapentin (NEURONTIN) 100 MG capsule TAKE 1 CAPSULE BY MOUTH IN THE MORNING AND 3 CAPSULES IN THE EVENING (Patient taking differently: 100 mg. TAKE 1 CAPSULE BY MOUTH)   gabapentin (NEURONTIN) 300 MG capsule Take 2 capsules (600 mg total) by mouth at bedtime.   hydrochlorothiazide (MICROZIDE) 12.5 MG capsule Take 12.5 mg by mouth daily.    levalbuterol (XOPENEX)  0.63 MG/3ML nebulizer solution USE 1 VIAL VIA NEBULIZER EVERY 4 HOURS AS NEEDED FOR WHEEZING OR SHORTNESS OF BREATH   omeprazole (PRILOSEC) 20 MG capsule Take 20 mg by mouth daily.     PROAIR HFA 108 (90 Base) MCG/ACT inhaler INHALE 2 PUFFS INTO THE LUNGS EVERY 4 HOURS AS NEEDED FOR WHEEZING OR SHORTNESS OF BREATH. (Patient taking differently: Inhale 2 puffs into the lungs every 4 (four) hours as needed for wheezing or shortness of breath. )   sertraline (ZOLOFT) 100 MG tablet Take 100 mg by mouth daily.    telmisartan (MICARDIS) 40 MG tablet Take 40 mg by mouth daily.   TRELEGY ELLIPTA 100-62.5-25 MCG/INH AEPB INHALE 1 PUFF INTO THE LUNGS DAILY   vitamin B-12 (CYANOCOBALAMIN) 1000 MCG tablet Take 1,000 mcg by mouth daily.   No facility-administered encounter medications on file as of 02/16/2019.     Past Medical History:  Diagnosis Date   Allergic rhinitis, cause unspecified    Anxiety    Asthma    Cancer ([facility]) 07/21/2018   colon   COPD (chronic obstructive pulmonary disease) ([facility])    Depression    Dyspnea    upon exertion   GERD (gastroesophageal reflux disease)    Hypertension    PUD (peptic ulcer disease)    avoids aspirin   Sleep apnea    uses C-PAP    Past Surgical History:  Procedure Laterality Date   CATARACT EXTRACTION, BILATERAL     COLONOSCOPY     LAPAROSCOPIC LYSIS OF ADHESIONS N/A 07/21/2018   Procedure: Laparoscopic Lysis Of Adhesions;  Surgeon: Fanny Skates, MD;  Location: [facility];  Service: General;  Laterality: N/A;   LAPAROSCOPIC PARTIAL COLECTOMY Left 07/21/2018   Procedure: LAPARASCOPIC TAKEDOWN OF SPLENIC FLEXURE; OPEN LEFT COLETCTOMY WITH PRIMARY ANASTOMOSIS;  Surgeon: Fanny Skates, MD;  Location: [facility];  Service: General;  Laterality: Left;   NASAL SEPTUM SURGERY  1960's   TONSILLECTOMY     UMBILICAL HERNIA REPAIR N/A 07/21/2018   Procedure: PRIMARY REPAIR OF UMBILICAL HERNIA , ADULT;  Surgeon: Fanny Skates, MD;  Location: [facility];  Service: General;  Laterality: N/A;    Social History   Socioeconomic History   Marital status: Married    Spouse name: Not on file   Number of children: 0   Years of education: Not on file   Highest education level: Bachelor's degree (e.g., BA, AB, BS)  Occupational History   Occupation: retired Psychiatric nurse  Social Needs   Financial resource strain: Not on file   Food insecurity    Worry: Not on file  Inability: Not on file   Transportation needs    Medical: Not on file    Non-medical: Not on file  Tobacco Use   Smoking status: Former Smoker    Packs/day: 0.50    Types: Cigarettes    Quit date: 04/12/2011    Years since quitting: 7.8   Smokeless tobacco: Never Used  Substance and Sexual Activity   Alcohol use: Yes    Alcohol/week: 0.0 - 6.0 standard drinks    Comment: 1 liquor drink, 1 glass wine daily   Drug use: No   Sexual activity: Not on file  Lifestyle   Physical activity    Days per week: Not on file    Minutes per session: Not on file   Stress: Not on file  Relationships   Social connections    Talks on phone: Not on file    Gets together: Not on file    Attends religious service: Not on file    Active member of club or organization: Not on file    Attends meetings of clubs or organizations: Not on file    Relationship status: Not on file   Intimate partner violence    Fear of current or ex partner: Not on file    Emotionally abused: Not on file    Physically abused: Not on file    Forced sexual activity: Not on file  Other Topics Concern   Not on file  Social History Narrative   Not on file    Family Status  Relation Name Status   Father  Deceased       heart disease, suicide, EtOHic   Mother  Deceased       breast cancer   Sister  Alive       brain tumor, anxiety, depression   Mat Uncle  (Not Specified)    Review of Systems Review of Systems  Constitutional: Negative.   HENT: Negative.     Eyes: Negative.   Respiratory: Negative.   Cardiovascular: Negative.   Musculoskeletal: Positive for back pain (worse.  sees Dr. Nelva Bush) and joint pain (knee pain - awaiting on Dr. Theda Sers for MRI).  Skin: Negative.   Neurological: Positive for tingling.      Objective:   VITALS:   Vitals:   02/16/19 1059  BP: (!) 109/54  Pulse: 75  SpO2: 92%  Weight: 202 lb 9.6 oz (91.9 kg)  Height: 5\' 5"  (1.651 m)   Gen:  Appears stated age and in NAD. HEENT:  Normocephalic, atraumatic. The mucous membranes are moist. The superficial temporal arteries are without ropiness or tenderness. Cardiovascular: Regular rate rhythm Lungs: Clear to auscultation bilaterally Neck: There is a left carotid bruit.  NEUROLOGICAL:  Orientation:  The patient is alert and oriented x3 Cranial nerves: There is good facial symmetry.  Extraocular muscles are intact.  Visual fields are full.  Speech is fluent and clear.  Soft palate rises symmetrically.  Hearing is intact to conversational tone. Tone: Tone is good throughout. Sensation: Sensation is intact to light touch and pinprick throughout (facial, trunk, extremities).  Pinprick was not particularly decreased in a stocking distribution.  Vibration is absent at the bilateral big toe and decreased at the ankle bilaterally.  There is no extinction with double simultaneous stimulation. There is no sensory dermatomal level identified. Coordination:  The patient has no dysdiadichokinesia or dysmetria. Motor: Strength is 5/5 in the bilateral upper and lower extremities.  Shoulder shrug is equal bilaterally.  There is no pronator drift.  There are no fasciculations noted. Gait and Station: The patient is able to ambulate without difficulty.  He did have difficulty ambulating in a tandem fashion and was unable to do this.  MOVEMENT EXAM: Tremor:  There is postural tremor, left greater than right.  He does have trouble with Archimedes spirals.  Labs:  Lab work was  dated December 26, 2017.  Hemoglobin A1c was 6.4.  TSH was 1.51.  B12 was low at 143.  White blood cells were 5.0, hemoglobin 13.0, hematocrit 37.5 and platelets 285.  Sodium was 139, potassium 3.6, chloride 102, CO2 29, BUN 18, creatinine 0.95, glucose 116, AST 17, ALT 13, alkaline phosphatase 63  Lab Results  Component Value Date   VITAMINB12 244 04/03/2018      Assessment/Plan:   1.  Peripheral neuropathy  -Doing better on gabapentin, 100 mg in the morning and 600 milligrams at bedtime.  He will let me know when he needs a refill.  2.  Carotid stenosis on the right  -Following with vascular surgery.  He has an asymptomatic 60 to 79% stenosis.  3.  Hyperreflexia  -He does have some hyperreflexia, but did just not describe any significant neck pain, so we decided to hold off on further scanning.  This really had not changed since I last saw him a few years ago.  4.  Essential Tremor.  -This is evidenced by the symmetrical nature and longstanding hx of gradually getting worse.  We discussed nature and pathophysiology.  We discussed that this can continue to gradually get worse with time.  We discussed that some medications can worsen this, as can caffeine use.  We discussed medication therapy as well as surgical therapy.  Patient is now ready to try some medication.  We will try primidone, 50 mg nightly.  Risks, benefits, side effects and alternative therapies were discussed.  The opportunity to ask questions was given and they were answered to the best of my ability.  The patient expressed understanding and willingness to follow the outlined treatment protocols.  5.  Colon cancer, stage II  -No chemotherapy needed.  Following with oncology.  6.  b12 deficiency  -on supplement 7.  Follow up is anticipated in the next 4-6 months, sooner should new neurologic issues arise.  Much greater than 50% of this visit was spent in counseling and coordinating care.  Total face to face time:  25 min

## 2019-02-16 ENCOUNTER — Ambulatory Visit: Payer: Medicare Other | Admitting: Neurology

## 2019-02-16 ENCOUNTER — Other Ambulatory Visit: Payer: Self-pay

## 2019-02-16 ENCOUNTER — Encounter: Payer: Self-pay | Admitting: Neurology

## 2019-02-16 VITALS — BP 109/54 | HR 75 | Ht 65.0 in | Wt 202.6 lb

## 2019-02-16 DIAGNOSIS — C189 Malignant neoplasm of colon, unspecified: Secondary | ICD-10-CM

## 2019-02-16 DIAGNOSIS — E538 Deficiency of other specified B group vitamins: Secondary | ICD-10-CM | POA: Diagnosis not present

## 2019-02-16 DIAGNOSIS — G609 Hereditary and idiopathic neuropathy, unspecified: Secondary | ICD-10-CM

## 2019-02-16 DIAGNOSIS — I6523 Occlusion and stenosis of bilateral carotid arteries: Secondary | ICD-10-CM

## 2019-02-16 DIAGNOSIS — G250 Essential tremor: Secondary | ICD-10-CM

## 2019-02-16 MED ORDER — PRIMIDONE 50 MG PO TABS: 50.0000 mg | ORAL_TABLET | Freq: Every day | ORAL | 1 refills | Status: DC

## 2019-02-16 NOTE — Patient Instructions (Signed)
1.  Start primidone - 50 mg - 1/2 tablet at night for a week and then increase to 1 tablet nightly 2.  Continue gabapentin - 100 mg in the AM and 600mg  at bedtime

## 2019-03-18 ENCOUNTER — Inpatient Hospital Stay: Payer: Medicare Other | Attending: Oncology

## 2019-03-18 ENCOUNTER — Telehealth: Payer: Self-pay | Admitting: *Deleted

## 2019-03-18 ENCOUNTER — Other Ambulatory Visit: Payer: Self-pay

## 2019-03-18 ENCOUNTER — Inpatient Hospital Stay: Payer: Medicare Other | Admitting: Oncology

## 2019-03-18 VITALS — BP 137/45 | HR 66 | Temp 98.2°F | Resp 17 | Ht 65.0 in | Wt 203.1 lb

## 2019-03-18 DIAGNOSIS — C186 Malignant neoplasm of descending colon: Secondary | ICD-10-CM

## 2019-03-18 DIAGNOSIS — Z85038 Personal history of other malignant neoplasm of large intestine: Secondary | ICD-10-CM | POA: Insufficient documentation

## 2019-03-18 DIAGNOSIS — G629 Polyneuropathy, unspecified: Secondary | ICD-10-CM | POA: Diagnosis not present

## 2019-03-18 DIAGNOSIS — E538 Deficiency of other specified B group vitamins: Secondary | ICD-10-CM | POA: Insufficient documentation

## 2019-03-18 DIAGNOSIS — J449 Chronic obstructive pulmonary disease, unspecified: Secondary | ICD-10-CM | POA: Insufficient documentation

## 2019-03-18 DIAGNOSIS — I10 Essential (primary) hypertension: Secondary | ICD-10-CM | POA: Insufficient documentation

## 2019-03-18 LAB — CEA (IN HOUSE-CHCC): CEA (CHCC-In House): 1.56 ng/mL (ref 0.00–5.00)

## 2019-03-18 NOTE — Progress Notes (Signed)
  [facility] OFFICE PROGRESS NOTE   Diagnosis: Colon cancer  INTERVAL HISTORY:   Mr. Croft returns for a scheduled visit.  He feels well.  He has chronic back pain.  Back pain improved with a steroid injection.  No difficulty with bowel function.  He has not undergone a completion colonoscopy.  Objective:  Vital signs in last 24 hours:  Blood pressure (!) 137/45, pulse 66, temperature 98.2 F (36.8 C), temperature source Temporal, resp. rate 17, height _0  (1.651 m), weight 203 lb 1.6 oz (92.1 kg), SpO2 97 %.    HEENT: Neck without mass Lymphatics: No cervical, supraclavicular, axillary, or inguinal nodes GI: No mass, no hepatosplenomegaly, nontender Vascular: No leg edema   Lab Results:  Lab Results  Component Value Date   WBC 5.4 07/25/2018   HGB 8.9 (L) 07/25/2018   HCT 27.7 (L) 07/25/2018   MCV 93.3 07/25/2018   PLT 245 07/25/2018   NEUTROABS 5.3 07/17/2018    CMP  Lab Results  Component Value Date   NA 141 07/25/2018   K 3.3 (L) 07/25/2018   CL 105 07/25/2018   CO2 29 07/25/2018   GLUCOSE 107 (H) 07/25/2018   BUN <5 (L) 07/25/2018   CREATININE 0.81 07/25/2018   CALCIUM 8.1 (L) 07/25/2018   PROT 6.0 (L) 07/17/2018   ALBUMIN 3.4 (L) 07/17/2018   AST 19 07/17/2018   ALT 14 07/17/2018   ALKPHOS 85 07/17/2018   BILITOT 0.6 07/17/2018   GFRNONAA >60 07/25/2018   GFRAA >60 07/25/2018    Lab Results  Component Value Date   CEA1 1.8 07/17/2018    No results found for: INR  Imaging:  No results found.  Medications: I have reviewed the patient's current medications.   Assessment/Plan: 1. Colon cancer   Colonoscopy by Dr. Oletta Lamas 06/22/2018-polypoid completely obstructing large mass was found in the proximal sigmoid colon.  Pathology showed fragments of ulcer and inflammatory debris; no viable tissue present.    CT abdomen/pelvis 06/30/2018-severe descending and sigmoid diverticulosis.  There was a focal dilatation of the mid  descending colon to approximately 8.5 x 6.2 cm.  There was no overt exophytic mass or mesenteric lymphadenopathy.  No evidence of abdominal metastatic disease.    07/21/2018 open left colectomy with primary anastomosis, primary repair of umbilical hernia by Dr. Dalbert Batman.  Pathology showed invasive moderately differentiated adenocarcinoma with mucinous features measuring 7.5 cm.  There was circumferential involvement of the descending colon with luminal obstruction.  Carcinoma focally invaded into the pericolonic soft tissue.  Margins negative.  No lymphovascular or perineural invasion.  21 lymph nodes negative for cancer; loss of expression MLH1 and PMS2; MSI high.  MLH1 hyper methylation present 2. Upper endoscopy 06/22/2018-esophageal mucosal changes suggestive of long segment Barrett's esophagus.  A few gastric polyps.  Normal examined duodenum.  GERD.  Stomach biopsy-fundic gland polyp.  Negative for dysplasia.  Esophagus biopsy-Barrett's mucosa.  Negative for dysplasia.   3. COPD 4. Hypertension 5. B12 deficiency 6. Peripheral neuropathy   Disposition: Mr. Kersh is in clinical remission from colon cancer.  We will follow-up on the CEA from today.  He will return for an office visit and CEA in 6 months. I will refer him to [facility] gastroenterology for a completion/surveillance colonoscopy.  Betsy Coder, MD  03/18/2019  1:38 PM

## 2019-03-18 NOTE — Telephone Encounter (Signed)
-----   Message from Ladell Pier, MD sent at 03/18/2019  4:20 PM EST ----- Please call patient, cea is normal

## 2019-03-18 NOTE — Telephone Encounter (Signed)
Per Dr. Benay Spice, called pt regarding normal CEA. Pt verbalized understanding.

## 2019-03-19 ENCOUNTER — Telehealth: Payer: Self-pay | Admitting: Oncology

## 2019-03-19 NOTE — Telephone Encounter (Signed)
Scheduled per los. Called and spoke with patient. Confirmed appt 

## 2019-03-31 ENCOUNTER — Telehealth: Payer: Self-pay | Admitting: *Deleted

## 2019-03-31 NOTE — Telephone Encounter (Signed)
Confirmed w/[facility] they received referral and called patient x1 with no return call. They will reach out a 2nd time and if no return call, will mail letter. Called wife and let her know status. She will have Juleen China call tomorrow.

## 2019-06-04 ENCOUNTER — Ambulatory Visit: Payer: Medicare PPO | Attending: Internal Medicine

## 2019-06-04 ENCOUNTER — Other Ambulatory Visit: Payer: Self-pay

## 2019-06-04 DIAGNOSIS — Z23 Encounter for immunization: Secondary | ICD-10-CM

## 2019-06-04 NOTE — Progress Notes (Signed)
   Covid-19 Vaccination Clinic  Name:  Glen [name]    MRN: [id number] DOB: May 04, 1941  06/04/2019  Glen [name] was observed post Covid-19 immunization for 15 minutes without incidence. He was provided with Vaccine Information Sheet and instruction to access the V-Safe system.   Glen [name] was instructed to call 911 with any severe reactions post vaccine: [name] Difficulty breathing  . Swelling of your face and throat  . A fast heartbeat  . A bad rash all over your body  . Dizziness and weakness    Immunizations Administered    Name Date Dose VIS Date Route   Pfizer COVID-19 Vaccine 06/04/2019  9:15 AM 0.3 mL 04/23/2019 Intramuscular   Manufacturer: [place]   Lot: [id number]   [place]: [id number]

## 2019-06-22 ENCOUNTER — Other Ambulatory Visit: Payer: Self-pay | Admitting: Internal Medicine

## 2019-06-25 ENCOUNTER — Ambulatory Visit: Payer: Medicare PPO | Attending: Internal Medicine

## 2019-06-25 DIAGNOSIS — Z23 Encounter for immunization: Secondary | ICD-10-CM

## 2019-06-25 NOTE — Progress Notes (Signed)
   Covid-19 Vaccination Clinic  Name:  Glen [name]    MRN: [id number] DOB: 01-Dec-1940  06/25/2019  [name] was observed post Covid-19 immunization for 15 minutes without incidence. He was provided with Vaccine Information Sheet and instruction to access the V-Safe system.   [name] was instructed to call 911 with any severe reactions post vaccine: [name] Difficulty breathing  . Swelling of your face and throat  . A fast heartbeat  . A bad rash all over your body  . Dizziness and weakness    Immunizations Administered    Name Date Dose VIS Date Route   Pfizer COVID-19 Vaccine 06/25/2019 10:28 AM 0.3 mL 04/23/2019 Intramuscular   Manufacturer: [place]   Lot: [id number]   [place]: [id number]

## 2019-07-19 ENCOUNTER — Encounter: Payer: Self-pay | Admitting: Physical Therapy

## 2019-07-19 ENCOUNTER — Other Ambulatory Visit: Payer: Self-pay

## 2019-07-19 ENCOUNTER — Ambulatory Visit: Payer: Medicare PPO | Attending: Family Medicine | Admitting: Physical Therapy

## 2019-07-19 DIAGNOSIS — M25561 Pain in right knee: Secondary | ICD-10-CM | POA: Insufficient documentation

## 2019-07-19 DIAGNOSIS — G8929 Other chronic pain: Secondary | ICD-10-CM | POA: Diagnosis present

## 2019-07-19 DIAGNOSIS — M6281 Muscle weakness (generalized): Secondary | ICD-10-CM | POA: Insufficient documentation

## 2019-07-19 DIAGNOSIS — R262 Difficulty in walking, not elsewhere classified: Secondary | ICD-10-CM | POA: Diagnosis present

## 2019-07-19 DIAGNOSIS — M25562 Pain in left knee: Secondary | ICD-10-CM | POA: Insufficient documentation

## 2019-07-19 NOTE — Therapy (Signed)
[facility] [address] [place], [place], [address] Phone: [phone]   Fax:  [phone]  Physical Therapy Evaluation  Patient Details  Name: Glen [name] ELFYBOF MRN: [id number] Date of Birth: 1940-06-20 Referring Provider (PT): Dibas Koirala MD   Encounter Date: 07/19/2019  PT End of Session - 07/19/19 1501    Visit Number  1    Number of Visits  12    Date for PT Re-Evaluation  09/03/19    PT Start Time  7782    PT Stop Time  1530    PT Time Calculation (min)  45 min    Activity Tolerance  Patient tolerated treatment well    Behavior During Therapy  [facility] for tasks assessed/performed       Past Medical History:  Diagnosis Date  . Allergic rhinitis, cause unspecified   . Anxiety   . Asthma   . Cancer ([place]) 07/21/2018   colon  . COPD (chronic obstructive pulmonary disease) ([place])   . Depression   . Dyspnea    upon exertion  . GERD (gastroesophageal reflux disease)   . Hypertension   . PUD (peptic ulcer disease)    avoids aspirin  . Sleep apnea    uses C-PAP    Past Surgical History:  Procedure Laterality Date  . CATARACT EXTRACTION, BILATERAL    . COLONOSCOPY    . LAPAROSCOPIC LYSIS OF ADHESIONS N/A 07/21/2018   Procedure: Laparoscopic Lysis Of Adhesions;  Surgeon: Fanny Skates, MD;  Location: [place];  Service: General;  Laterality: N/A;  . LAPAROSCOPIC PARTIAL COLECTOMY Left 07/21/2018   Procedure: LAPARASCOPIC TAKEDOWN OF SPLENIC FLEXURE; OPEN LEFT COLETCTOMY WITH PRIMARY ANASTOMOSIS;  Surgeon: Fanny Skates, MD;  Location: [place];  Service: General;  Laterality: Left;  . NASAL SEPTUM SURGERY  1960's  . TONSILLECTOMY    . UMBILICAL HERNIA REPAIR N/A 07/21/2018   Procedure: PRIMARY REPAIR OF UMBILICAL HERNIA , ADULT;  Surgeon: Fanny Skates, MD;  Location: [place];  Service: General;  Laterality: N/A;    There were no vitals filed for this visit.   Subjective Assessment - 07/19/19 1454    Subjective  Pt arriving to therapy today for evaluation of bilateral knee pain. Pt also reporting low back pain and neck pain. Pt dx with spinal stenosis and OA.  Pt also reporting bilateral shoulder pain.    Limitations  Standing;Walking;Lifting    Diagnostic tests  MRI in past    Patient Stated Goals  Feel better    Currently in Pain?  Yes    Pain Score  9     Pain Location  Knee    Pain Orientation  Right;Left    Pain Descriptors / Indicators  Aching    Pain Type  Chronic pain    Pain Onset  More than a month ago    Pain Frequency  Constant    Aggravating Factors   transfers, bending    Pain Relieving Factors  standing, walking, bending    Effect of Pain on Daily Activities  difficulty with sit to stand, bending over,         [facility] PT Assessment - 07/19/19 0001      Assessment   Medical Diagnosis  L knee pain M25.562    Referring Provider (PT)  Dibas Koirala MD    Hand Dominance  Left    Prior Therapy  no      Precautions   Precautions  None  Restrictions   Weight Bearing Restrictions  No      Balance Screen   Has the patient fallen in the past 6 months  Yes    How many times?  1    Has the patient had a decrease in activity level because of a fear of falling?   No    Is the patient reluctant to leave their home because of a fear of falling?   No      Home Film/video editor residence    Living Arrangements  Spouse/significant other    Available Help at Discharge  Family    Type of [place] to enter    Entrance Stairs-Number of Steps  5    Entrance Stairs-Rails  Left      Prior Function   Level of Independence  Independent    Vocation  Retired    Leisure  play banjo, yard work      Associate Professor   Overall Cognitive Status  Within Advertising copywriter for tasks assessed      Observation/Other Assessments   Focus on Therapeutic Outcomes ([place])   deferred due to multiple joint involvement      Posture/Postural  Control   Posture/Postural Control  Postural limitations    Postural Limitations  Rounded Shoulders;Forward head      ROM / Strength   AROM / PROM / Strength  AROM;Strength      AROM   AROM Assessment Site  Knee    Right/Left Knee  Right;Left    Right Knee Extension  0    Right Knee Flexion  122    Left Knee Extension  0    Left Knee Flexion  124      Strength   Strength Assessment Site  Hip;Knee    Right/Left Hip  Right;Left    Right Hip Flexion  4+/5    Right Hip ABduction  4/5    Right Hip ADduction  4/5    Left Hip Flexion  4+/5    Left Hip ABduction  4/5    Left Hip ADduction  4/5    Right/Left Knee  Right;Left    Right Knee Flexion  4+/5    Right Knee Extension  4+/5    Left Knee Flexion  4+/5    Left Knee Extension  4+/5      Palpation   Patella mobility  WNL    Palpation comment  TTP: proximal patella tendon bilaterally      Special Tests    Special Tests  Knee Special Tests    Knee Special tests   Patellofemoral Apprehension Test      Patellofemoral Apprehension Test    Findings  Negative    Side   Right;Left      Transfers   Five time sit to stand comments   19 seconds using UE support   difficulty reaching full hip and knee extension     Ambulation/Gait   Gait Pattern  Step-through pattern;Poor foot clearance - left;Poor foot clearance - right    Gait Comments  mild forward trunk lean                Objective measurements completed on examination: See above findings.              PT Education - 07/19/19 1459    Education Details  PT POC, HEP    Person(s) Educated  Patient  Methods  Explanation;Demonstration;Other (comment)    Comprehension  Verbalized understanding;Returned demonstration          PT Long Term Goals - 07/19/19 1558      PT LONG TERM GOAL #1   Title  Pt will be independent in his HEP and progression.    Time  6    Period  Weeks    Status  New    Target Date  09/03/19      PT LONG TERM GOAL #2    Title  Pt will improve his 5 time sit to stand to </= 12 seconds.    Baseline  see flow sheets    Time  6    Period  Weeks    Status  New      PT LONG TERM GOAL #3   Title  pt will be albe to amb 1/2 mile with pain </= 3/10 in bilateral knees.    Baseline  9/10 today    Time  6    Period  Weeks    Status  New      PT LONG TERM GOAL #4   Title  Pt will be able to pick up objects from the floor using correct body mechanics with knee pain </= 4/10.    Baseline  9/10 to 10/10 with bending             Plan - 07/19/19 1602    Clinical Impression Statement  Pt presenting today with bilateral knee pain of 9/10 with bending and walking. Pt also reporting back, neck and bilateral shoulder pain 9/10 due to arthritis. Pt recently dx with spinal stenosis. Pt reporting difficulty with walking, bending, and lifting. Pt presenting with bilateral knee AROM WFL, mild weakness during MMT. Skilled PT needed to address his impairments with the below interventions. Further assessment of back as needed.    Personal Factors and Comorbidities  Age;Comorbidity 3+    Comorbidities  sleep apnea, COPD, anxiety, HTN, CA, asthma, depression, cataracts, hernia repair    Examination-Activity Limitations  Squat;Stairs;Stand;Carry    Examination-Participation Restrictions  Community Activity;Driving;Yard Work    Stability/Clinical Decision Making  Stable/Uncomplicated    Designer, jewellery  Low    Rehab Potential  Good    PT Frequency  2x / week    PT Duration  6 weeks    PT Treatment/Interventions  ADLs/Self Care Home Management;Cryotherapy;Ultrasound;Moist Heat;Iontophoresis 4mg /ml Dexamethasone;Electrical Stimulation;Gait training;Stair training;Functional mobility training;Therapeutic activities;Therapeutic exercise;Balance training;Neuromuscular re-education;Patient/family education;Passive range of motion;Dry needling;Taping;Manual techniques    PT Next Visit Plan  Pt has difficulty lying on his back,  LE strengtening, stretches, begin bike/Nustep, further assess low back as needed, TUG or BERG create a goal if appropriate    PT Home Exercise Plan  hamstring stretches, LAQ, piriformis stretch  all in sitting    Consulted and Agree with Plan of Care  Patient       Patient will benefit from skilled therapeutic intervention in order to improve the following deficits and impairments:  Pain, Postural dysfunction, Decreased activity tolerance, Decreased strength, Decreased range of motion, Difficulty walking  Visit Diagnosis: Chronic pain of left knee  Chronic pain of right knee  Difficulty in walking, not elsewhere classified  Muscle weakness (generalized)     Problem List Patient Active Problem List   Diagnosis Date Noted  . Colonic mass 07/21/2018  . Obstructive sleep apnea 12/06/2016  . Lung nodules 06/11/2015  . Tobacco use disorder, mild, in sustained remission 01/01/2010  . URTICARIA 01/01/2010  .  DYSPNEA 07/13/2009  . P U D 06/15/2009  . HYPERTENSION 06/08/2009  . ALLERGIC RHINITIS 06/08/2009  . COPD mixed type ([place]) 06/08/2009    Oretha Caprice, PT 07/19/2019, 4:08 PM  [facility] [address], [place], [place], [address] Phone: [phone]   Fax:  [phone]  Name: FREDERICH MONTILLA MRN: [id number] Date of Birth: 11-12-1940

## 2019-07-19 NOTE — Patient Instructions (Signed)
Access Code: [email or web address] URL: [email or web address] Date: 07/19/2019 Prepared by: Kearney Hard  Exercises Seated Hamstring Stretch with Strap - 3 reps - 30 seconds hold - 2x daily - 7x weekly Seated Piriformis Stretch - 3 reps - 30 seconds hold - 2x daily - 7x weekly Seated Long Arc Quad - 15 reps - 5 seconds hold - 2x daily - 7x weekly

## 2019-07-28 ENCOUNTER — Encounter: Payer: Self-pay | Admitting: Physical Therapy

## 2019-07-28 ENCOUNTER — Other Ambulatory Visit: Payer: Self-pay

## 2019-07-28 ENCOUNTER — Ambulatory Visit: Payer: Medicare PPO | Admitting: Physical Therapy

## 2019-07-28 DIAGNOSIS — R262 Difficulty in walking, not elsewhere classified: Secondary | ICD-10-CM

## 2019-07-28 DIAGNOSIS — M6281 Muscle weakness (generalized): Secondary | ICD-10-CM

## 2019-07-28 DIAGNOSIS — M25562 Pain in left knee: Secondary | ICD-10-CM

## 2019-07-28 DIAGNOSIS — G8929 Other chronic pain: Secondary | ICD-10-CM

## 2019-07-28 NOTE — Therapy (Signed)
[facility] [address] [place], [place], [address] Phone: [phone]   Fax:  [phone]  Physical Therapy Treatment  Patient Details  Name: Glen [name] MRN: [id number] Date of Birth: 04/03/1941 Referring Provider (PT): Dibas Koirala MD   Encounter Date: 07/28/2019  PT End of Session - 07/28/19 0851    Visit Number  2    Date for PT Re-Evaluation  09/03/19    PT Start Time  0845    PT Stop Time  0923    PT Time Calculation (min)  38 min    Activity Tolerance  Patient tolerated treatment well    Behavior During Therapy  [facility] for tasks assessed/performed       Past Medical History:  Diagnosis Date  . Allergic rhinitis, cause unspecified   . Anxiety   . Asthma   . Cancer ([place]) 07/21/2018   colon  . COPD (chronic obstructive pulmonary disease) ([place])   . Depression   . Dyspnea    upon exertion  . GERD (gastroesophageal reflux disease)   . Hypertension   . PUD (peptic ulcer disease)    avoids aspirin  . Sleep apnea    uses C-PAP    Past Surgical History:  Procedure Laterality Date  . CATARACT EXTRACTION, BILATERAL    . COLONOSCOPY    . LAPAROSCOPIC LYSIS OF ADHESIONS N/A 07/21/2018   Procedure: Laparoscopic Lysis Of Adhesions;  Surgeon: Fanny Skates, MD;  Location: [place];  Service: General;  Laterality: N/A;  . LAPAROSCOPIC PARTIAL COLECTOMY Left 07/21/2018   Procedure: LAPARASCOPIC TAKEDOWN OF SPLENIC FLEXURE; OPEN LEFT COLETCTOMY WITH PRIMARY ANASTOMOSIS;  Surgeon: Fanny Skates, MD;  Location: [place];  Service: General;  Laterality: Left;  . NASAL SEPTUM SURGERY  1960's  . TONSILLECTOMY    . UMBILICAL HERNIA REPAIR N/A 07/21/2018   Procedure: PRIMARY REPAIR OF UMBILICAL HERNIA , ADULT;  Surgeon: Fanny Skates, MD;  Location: [place];  Service: General;  Laterality: N/A;    There were no vitals filed for this visit.  Subjective Assessment - 07/28/19 0848    Subjective  The stretches hurt  but they're doing what they're supposed to do.  My back pain has gone down a little bit.  I was able to do yardwork over the weekend.    Limitations  Standing;Walking;Lifting    Diagnostic tests  MRI in past    Patient Stated Goals  Feel better    Currently in Pain?  Yes    Pain Score  5     Pain Location  Knee    Pain Orientation  Right;Left    Pain Descriptors / Indicators  Aching    Pain Type  Chronic pain    Pain Onset  More than a month ago    Aggravating Factors   transfers, bending    Pain Relieving Factors  stand, walk, bend    Effect of Pain on Daily Activities  difficult with sit to stand, bend over                       [facility] Adult PT Treatment/Exercise - 07/28/19 0001      Exercises   Exercises  Lumbar;Knee/Hip;Shoulder;Neck      Neck Exercises: Seated   Neck Retraction  10 reps;5 secs    Cervical Rotation  Both;10 reps    Cervical Rotation Limitations  in retraction      Lumbar Exercises: Seated   Other Seated Lumbar Exercises  trunk rotation 3x5 sec bil    Other Seated Lumbar Exercises  large green ball rollouts for lumbar flexion x 10 reps, 3 sec hold      Knee/Hip Exercises: Stretches   Gastroc Stretch  Both;2 reps;20 seconds    Gastroc Stretch Limitations  slant board      Knee/Hip Exercises: Aerobic   Nustep  L2-L4 x 6', seat 10, arms 10, PT present to discuss symptoms and initial HEP      Knee/Hip Exercises: Standing   Forward Step Up  Both;2 sets;5 reps;Step Height: 6";Hand Hold: 2    Forward Step Up Limitations  with contralateral knee high march and pause for stance leg glut strength      Knee/Hip Exercises: Seated   Long Arc Quad  Strengthening;Both;1 set;15 reps    Long [employer] Limitations  Pt reports this is hard on HEP, fatigue and some pain    Knee/Hip Flexion  knee flexion with slider x 10 bil    Sit to [employer]  2 sets;with UE support;5 reps   can do without UE support, second set     Shoulder Exercises: Seated   Other Seated  Exercises  backward shoulder rolls with scapular retraction and PT cue to sit tall through thoracic spine             PT Education - 07/28/19 0911    Education Details  Access Code: [email or web address]    Person(s) Educated  Patient    Methods  Explanation;Demonstration;Verbal cues;Handout    Comprehension  Verbalized understanding;Returned demonstration          PT Long Term Goals - 07/19/19 1558      PT LONG TERM GOAL #1   Title  Pt will be independent in his HEP and progression.    Time  6    Period  Weeks    Status  New    Target Date  09/03/19      PT LONG TERM GOAL #2   Title  Pt will improve his 5 time sit to stand to </= 12 seconds.    Baseline  see flow sheets    Time  6    Period  Weeks    Status  New      PT LONG TERM GOAL #3   Title  pt will be albe to amb 1/2 mile with pain </= 3/10 in bilateral knees.    Baseline  9/10 today    Time  6    Period  Weeks    Status  New      PT LONG TERM GOAL #4   Title  Pt will be able to pick up objects from the floor using correct body mechanics with knee pain </= 4/10.    Baseline  9/10 to 10/10 with bending            Plan - 07/28/19 0923    Clinical Impression Statement  Session focused on improved cervical and shoulder posture and gentle A/ROM of trunk and LEs. Pt needed mod cueing for neck retraction.  Pt performed TUG in 8 sec without difficulty today.  Pt with report of mild knee soreness increase in LEs.  He is quick to fatigue in quads and gluts.  PT progressed HEP for posture and sit to stand.  Pt really liked the NuStep and performed a few min a L4 and had to back down to L2 for fatigue.  He will continue to benefit from PT for ROM, flexibility and  strength with history of multiple painful joints and spinal stenosis.    Comorbidities  sleep apnea, COPD, anxiety, HTN, CA, asthma, depression, cataracts, hernia repair    PT Next Visit Plan  Pt has difficulty lying on his back, NuStep, review HEP, continue trunk  ROM, LE strength, update HEP if Pt isn't overwhelmed with current HEP    PT Home Exercise Plan  Access Code: [email or web address]    [email or web address] and Agree with Plan of Care  Patient       Patient will benefit from skilled therapeutic intervention in order to improve the following deficits and impairments:     Visit Diagnosis: Chronic pain of left knee  Chronic pain of right knee  Difficulty in walking, not elsewhere classified  Muscle weakness (generalized)     Problem List Patient Active Problem List   Diagnosis Date Noted  . Colonic mass 07/21/2018  . Obstructive sleep apnea 12/06/2016  . Lung nodules 06/11/2015  . Tobacco use disorder, mild, in sustained remission 01/01/2010  . URTICARIA 01/01/2010  . DYSPNEA 07/13/2009  . P U D 06/15/2009  . HYPERTENSION 06/08/2009  . ALLERGIC RHINITIS 06/08/2009  . COPD mixed type ([place]) 06/08/2009    Laquinda Moller, PT 07/28/19 9:28 AM   [facility] [address], [place], [place], [address] Phone: [phone]   Fax:  [phone]  Name: Glen [name] MRN: [id number] Date of Birth: [birth year]

## 2019-07-28 NOTE — Patient Instructions (Signed)
Access Code: [email or web address]: [email or web address]: 03/17/2021Prepared by: Venetia Night BeuhringExercises  Seated Hamstring Stretch with Strap - 2 x daily - 7 x weekly - 3 reps - 30 seconds hold  Seated Piriformis Stretch - 2 x daily - 7 x weekly - 3 reps - 30 seconds hold  Seated Long Arc Quad - 2 x daily - 7 x weekly - 15 reps - 5 seconds hold  Sit to Stand with Hands on Knees - 3 x daily - 7 x weekly - 1 sets - 5 reps  Seated Passive Cervical Retraction - 2 x daily - 7 x weekly - 1 sets - 10 reps - 5 hold  Seated Cervical Retraction and Rotation - 2 x daily - 7 x weekly - 1 sets - 10 reps  Seated Shoulder Rolls - 2 x daily - 7 x weekly - 1 sets - 10 reps

## 2019-08-03 ENCOUNTER — Other Ambulatory Visit: Payer: Self-pay

## 2019-08-03 ENCOUNTER — Encounter: Payer: Self-pay | Admitting: Physical Therapy

## 2019-08-03 ENCOUNTER — Ambulatory Visit: Payer: Medicare PPO | Admitting: Physical Therapy

## 2019-08-03 DIAGNOSIS — R262 Difficulty in walking, not elsewhere classified: Secondary | ICD-10-CM

## 2019-08-03 DIAGNOSIS — M6281 Muscle weakness (generalized): Secondary | ICD-10-CM

## 2019-08-03 DIAGNOSIS — G8929 Other chronic pain: Secondary | ICD-10-CM

## 2019-08-03 DIAGNOSIS — M25562 Pain in left knee: Secondary | ICD-10-CM | POA: Diagnosis not present

## 2019-08-03 DIAGNOSIS — M25561 Pain in right knee: Secondary | ICD-10-CM

## 2019-08-03 NOTE — Therapy (Signed)
[facility] [address] [place], [place], [address] Phone: [phone]   Fax:  [phone]  Physical Therapy Treatment  Patient Details  Name: Glen [name] MRN: [id number] Date of Birth: 06-02-1940 Referring Provider (PT): Dibas Koirala MD   Encounter Date: 08/03/2019  PT End of Session - 08/03/19 1620    Visit Number  3    Number of Visits  13    Date for PT Re-Evaluation  09/03/19    PT Start Time  0175    PT Stop Time  1615    PT Time Calculation (min)  42 min    Activity Tolerance  Patient tolerated treatment well;Patient limited by pain    Behavior During Therapy  [facility] for tasks assessed/performed       Past Medical History:  Diagnosis Date  . Allergic rhinitis, cause unspecified   . Anxiety   . Asthma   . Cancer ([place]) 07/21/2018   colon  . COPD (chronic obstructive pulmonary disease) ([place])   . Depression   . Dyspnea    upon exertion  . GERD (gastroesophageal reflux disease)   . Hypertension   . PUD (peptic ulcer disease)    avoids aspirin  . Sleep apnea    uses C-PAP    Past Surgical History:  Procedure Laterality Date  . CATARACT EXTRACTION, BILATERAL    . COLONOSCOPY    . LAPAROSCOPIC LYSIS OF ADHESIONS N/A 07/21/2018   Procedure: Laparoscopic Lysis Of Adhesions;  Surgeon: Fanny Skates, MD;  Location: [place];  Service: General;  Laterality: N/A;  . LAPAROSCOPIC PARTIAL COLECTOMY Left 07/21/2018   Procedure: LAPARASCOPIC TAKEDOWN OF SPLENIC FLEXURE; OPEN LEFT COLETCTOMY WITH PRIMARY ANASTOMOSIS;  Surgeon: Fanny Skates, MD;  Location: [place];  Service: General;  Laterality: Left;  . NASAL SEPTUM SURGERY  1960's  . TONSILLECTOMY    . UMBILICAL HERNIA REPAIR N/A 07/21/2018   Procedure: PRIMARY REPAIR OF UMBILICAL HERNIA , ADULT;  Surgeon: Fanny Skates, MD;  Location: [place];  Service: General;  Laterality: N/A;    There were no vitals filed for this visit.  Subjective Assessment -  08/03/19 1540    Subjective  Pt reports his back "went out" on Sunday and has been painful since then.  "I'm not sure how much I'll be able to do". Pt reports he is receiving injections in his back this Thursday.    Patient Stated Goals  Feel better    Currently in Pain?  Yes    Pain Score  8     Pain Location  Back    Pain Orientation  Right    Pain Descriptors / Indicators  Aching    Pain Radiating Towards  into buttock (sometimes goes to knee)    Aggravating Factors   ?    Pain Relieving Factors  ?    Multiple Pain Sites  Yes    Pain Score  10    Pain Location  Knee    Pain Orientation  Right;Left    Aggravating Factors   getting up and down    Pain Relieving Factors  ?         [facility] PT Assessment - 08/03/19 0001      Assessment   Medical Diagnosis  L knee pain M25.562    Referring Provider (PT)  Dibas Koirala MD    Hand Dominance  Left    Prior Therapy  no      [place] Adult PT Treatment/Exercise - 08/03/19  0001      Self-Care   Self-Care  Other Self-Care Comments    Other Self-Care Comments   Pt educated on self massage with roller stick to bilat thighs and ball release against wall for lumbar, periscapular and hip musculature; pt returned demo with cues.       Neck Exercises: Seated   Neck Retraction  1 rep;5 secs    Cervical Rotation  Right;Left;5 reps   with head nods/chin tucks (3 per rep)     Lumbar Exercises: Stretches   Standing Extension  2 reps;5 seconds   ext to neutral; past neutral is painful   Other Lumbar Stretch Exercise  forward trunk flexion (seated) with arms on green pball, rolling ball forward/ back to neutral x 10 reps, repeated x 5 reps with lateral trunk flexion (like childs pose with lateral trunk flexion) to tolerance.  Cues for form.       Knee/Hip Exercises: Stretches   Passive Hamstring Stretch  Right;Left;2 reps;30 seconds   seated, straight back   Passive Hamstring Stretch Limitations  cues to not bounce    Hip Flexor Stretch   Right;Left;2 reps;30 seconds   seated, leg back (knee bent 90 deg)   Piriformis Stretch  Right;Left;1 rep;20 seconds   modified pigeon table pose   Piriformis Stretch Limitations  seated bring knee to chest not tolerated; increased LBP.       Knee/Hip Exercises: Aerobic   Nustep  L2: 6 min (arms/legs), PTA present to discuss progress      Knee/Hip Exercises: Seated   Sit to Sand  2 sets;5 reps;without UE support   with support for 2nd set     Shoulder Exercises: Seated   Other Seated Exercises  shoulder rolls x 5 reps each direction.     Other Seated Exercises  W's x 5 sec x 10 reps        PT Long Term Goals - 07/19/19 1558      PT LONG TERM GOAL #1   Title  Pt will be independent in his HEP and progression.    Time  6    Period  Weeks    Status  New    Target Date  09/03/19      PT LONG TERM GOAL #2   Title  Pt will improve his 5 time sit to stand to </= 12 seconds.    Baseline  see flow sheets    Time  6    Period  Weeks    Status  New      PT LONG TERM GOAL #3   Title  pt will be albe to amb 1/2 mile with pain </= 3/10 in bilateral knees.    Baseline  9/10 today    Time  6    Period  Weeks    Status  New      PT LONG TERM GOAL #4   Title  Pt will be able to pick up objects from the floor using correct body mechanics with knee pain </= 4/10.    Baseline  9/10 to 10/10 with bending            Plan - 08/03/19 1629    Clinical Impression Statement  Pt participated well throughout session, despite report of elevated pain in back and knees at beginning of session.  All exercises were tolerted without increase in pain.   Pt required some cues on form for stretching. Modified HEP to remove piriformis stretch (since not tolerated)  and added hip flexor stretch.   Pt progressing towards LTGs and will benefit from continued PT intervention to reduce pain and improve mobility.    Comorbidities  sleep apnea, COPD, anxiety, HTN, CA, asthma, depression, cataracts, hernia  repair    PT Next Visit Plan  Pt has difficulty lying on his back, NuStep, review HEP, continue trunk ROM, LE strength, update HEP if Pt isn't overwhelmed with current HEP    PT Home Exercise Plan  Access Code: [email or web address]    [email or web address] and Agree with Plan of Care  Patient       Patient will benefit from skilled therapeutic intervention in order to improve the following deficits and impairments:     Visit Diagnosis: Chronic pain of left knee  Chronic pain of right knee  Difficulty in walking, not elsewhere classified  Muscle weakness (generalized)     Problem List Patient Active Problem List   Diagnosis Date Noted  . Colonic mass 07/21/2018  . Obstructive sleep apnea 12/06/2016  . Lung nodules 06/11/2015  . Tobacco use disorder, mild, in sustained remission 01/01/2010  . URTICARIA 01/01/2010  . DYSPNEA 07/13/2009  . P U D 06/15/2009  . HYPERTENSION 06/08/2009  . ALLERGIC RHINITIS 06/08/2009  . COPD mixed type ([place]) 06/08/2009   Kerin Perna, PTA 08/03/19 4:47 PM  [facility] [address], [place], [place], [address] Phone: [phone]   Fax:  [phone]  Name: Glen [name] MRN: [id number] Date of Birth: 1940/12/11

## 2019-08-03 NOTE — Patient Instructions (Addendum)
  Access Code: [email or web address] URL: [email or web address] Date: 07/28/2019 Prepared by: [place]  Exercises Seated Hamstring Stretch with Strap - 2 x daily - 7 x weekly - 3 reps - 30 seconds hold Seated Long Arc Quad - 2 x daily - 7 x weekly - 15 reps - 5 seconds hold Sit to Stand with Hands on Knees - 3 x daily - 7 x weekly - 1 sets - 5 reps Seated Passive Cervical Retraction - 2 x daily - 7 x weekly - 1 sets - 10 reps - 5 hold Seated Cervical Retraction and Rotation - 2 x daily - 7 x weekly - 1 sets - 10 reps Seated Shoulder Rolls - 2 x daily - 7 x weekly - 1 sets - 10 reps

## 2019-08-06 ENCOUNTER — Ambulatory Visit: Payer: Medicare PPO | Admitting: Physical Therapy

## 2019-08-09 ENCOUNTER — Other Ambulatory Visit: Payer: Self-pay | Admitting: *Deleted

## 2019-08-09 ENCOUNTER — Other Ambulatory Visit: Payer: Self-pay

## 2019-08-09 DIAGNOSIS — I6523 Occlusion and stenosis of bilateral carotid arteries: Secondary | ICD-10-CM

## 2019-08-09 MED ORDER — GABAPENTIN 300 MG PO CAPS: 600.0000 mg | ORAL_CAPSULE | Freq: Every day | ORAL | 1 refills | Status: DC

## 2019-08-09 NOTE — Telephone Encounter (Signed)
Rx(s) sent to pharmacy electronically.  

## 2019-08-11 ENCOUNTER — Other Ambulatory Visit: Payer: Self-pay

## 2019-08-11 ENCOUNTER — Encounter: Payer: Self-pay | Admitting: Vascular Surgery

## 2019-08-11 ENCOUNTER — Ambulatory Visit (HOSPITAL_COMMUNITY)
Admission: RE | Admit: 2019-08-11 | Discharge: 2019-08-11 | Disposition: A | Discharge status: Home / Self Care | Payer: Medicare PPO | Source: Ambulatory Visit | Attending: Vascular Surgery | Admitting: Vascular Surgery

## 2019-08-11 ENCOUNTER — Ambulatory Visit: Payer: Medicare PPO | Admitting: Vascular Surgery

## 2019-08-11 VITALS — BP 110/53 | HR 74 | Temp 97.9°F | Resp 20 | Ht 65.0 in | Wt 190.0 lb

## 2019-08-11 DIAGNOSIS — I6521 Occlusion and stenosis of right carotid artery: Secondary | ICD-10-CM | POA: Diagnosis not present

## 2019-08-11 DIAGNOSIS — I6523 Occlusion and stenosis of bilateral carotid arteries: Secondary | ICD-10-CM | POA: Insufficient documentation

## 2019-08-11 MED ORDER — PRIMIDONE 50 MG PO TABS: 50.0000 mg | ORAL_TABLET | Freq: Every day | ORAL | 1 refills | Status: DC

## 2019-08-11 NOTE — Telephone Encounter (Signed)
Rx(s) sent to pharmacy electronically.  

## 2019-08-11 NOTE — Progress Notes (Signed)
Patient name: Glen [name] MRN: [id number] DOB: 12-02-1940 Sex: male  REASON FOR VISIT:   Follow-up of carotid disease.  HPI:   Glen [name] is a pleasant 79 y.o. male who I last saw on 02/10/2019.  He had a asymptomatic 60 to 79% right carotid stenosis with no significant stenosis on the left.  Both vertebral arteries were patent with antegrade flow.  He comes in for 58-month follow-up visit.  Of note he was being considered for knee replacements and on exam he had palpable pedal pulses so I did not see any contraindications to surgery from a vascular standpoint.  Since I saw him last, he denies any history of stroke, TIAs, expressive or receptive aphasia, or amaurosis fugax.  He is on aspirin and is on a statin.  He has a remote history of tobacco use.  He has significant back issues and has had some relief with injection therapy.  He also has issues with his knees.  I do not get any history of claudication or rest pain.  He does have some neuropathy in his feet.  He is on gabapentin.  Past Medical History:  Diagnosis Date  . Allergic rhinitis, cause unspecified   . Anxiety   . Asthma   . Cancer ([place]) 07/21/2018   colon  . COPD (chronic obstructive pulmonary disease) ([place])   . Depression   . Dyspnea    upon exertion  . GERD (gastroesophageal reflux disease)   . Hypertension   . PUD (peptic ulcer disease)    avoids aspirin  . Sleep apnea    uses C-PAP    Family History  Problem Relation Age of Onset  . Alcohol abuse Father        commited suicide  . Heart disease Father        had bypass  . Breast cancer Mother   . Prostate cancer Maternal Uncle        several     SOCIAL HISTORY: Social History   Tobacco Use  . Smoking status: Former Smoker    Packs/day: 0.50    Types: Cigarettes    Quit date: 04/12/2011    Years since quitting: 8.3  . Smokeless tobacco: Never Used  Substance Use Topics  . Alcohol use: Yes    Alcohol/week: 0.0 - 6.0 standard  drinks    Comment: 1 liquor drink, 1 glass wine daily    Allergies  Allergen Reactions  . Aspirin Other (See Comments)    pt has bleeding ulcers  . Lisinopril Cough    Current Outpatient Medications  Medication Sig Dispense Refill  . acetaminophen (TYLENOL) 650 MG CR tablet Take 1,950 mg by mouth at bedtime.    . ALPRAZolam (XANAX) 0.25 MG tablet Take 0.25 mg by mouth daily as needed for anxiety.     [name] amLODipine (NORVASC) 10 MG tablet Take 10 mg by mouth every evening.     [name] aspirin EC 81 MG tablet Take 81 mg by mouth at bedtime.     [name] atorvastatin (LIPITOR) 40 MG tablet Take 40 mg by mouth at bedtime.     . diphenhydrAMINE (BENADRYL) 25 MG tablet Take 75 mg by mouth at bedtime.     . gabapentin (NEURONTIN) 100 MG capsule TAKE 1 CAPSULE BY MOUTH IN THE MORNING AND 3 CAPSULES IN THE EVENING (Patient taking differently: 100 mg. TAKE 1 CAPSULE BY MOUTH) 120 capsule 5  . gabapentin (NEURONTIN) 300 MG capsule Take 2 capsules (600 mg total) by  mouth at bedtime. 180 capsule 1  . hydrochlorothiazide (MICROZIDE) 12.5 MG capsule Take 12.5 mg by mouth daily.     [name] levalbuterol (XOPENEX) 0.63 MG/3ML nebulizer solution USE 1 VIAL VIA NEBULIZER EVERY 4 HOURS AS NEEDED FOR WHEEZING OR SHORTNESS OF BREATH 1650 mL 1  . omeprazole (PRILOSEC) 20 MG capsule Take 20 mg by mouth daily.      [name] PROAIR HFA 108 (90 Base) MCG/ACT inhaler INHALE 2 PUFFS INTO THE LUNGS EVERY 4 HOURS AS NEEDED FOR WHEEZING OR SHORTNESS OF BREATH. 8.5 g 0  . sertraline (ZOLOFT) 100 MG tablet Take 100 mg by mouth daily.   4  . telmisartan (MICARDIS) 40 MG tablet Take 40 mg by mouth daily.    . TRELEGY ELLIPTA 100-62.5-25 MCG/INH AEPB INHALE 1 PUFF INTO THE LUNGS DAILY 180 each 1  . vitamin B-12 (CYANOCOBALAMIN) 1000 MCG tablet Take 1,000 mcg by mouth daily.    . primidone (MYSOLINE) 50 MG tablet Take 1 tablet (50 mg total) by mouth at bedtime. 90 tablet 1   No current facility-administered medications for this visit.    REVIEW OF  SYSTEMS:  [X]  denotes positive finding, [ ]  denotes negative finding Cardiac  Comments:  Chest pain or chest pressure:    Shortness of breath upon exertion: x   Short of breath when lying flat:    Irregular heart rhythm:        Vascular    Pain in calf, thigh, or hip brought on by ambulation:    Pain in feet at night that wakes you up from your sleep:  x   Blood clot in your veins:    Leg swelling:         Pulmonary    Oxygen at home:    Productive cough:     Wheezing:  x       Neurologic    Sudden weakness in arms or legs:     Sudden numbness in arms or legs:     Sudden onset of difficulty speaking or slurred speech:    Temporary loss of vision in one eye:     Problems with dizziness:         Gastrointestinal    Blood in stool:     Vomited blood:         Genitourinary    Burning when urinating:     Blood in urine:        Psychiatric    Major depression:         Hematologic    Bleeding problems:    Problems with blood clotting too easily:        Skin    Rashes or ulcers:        Constitutional    Fever or chills:     PHYSICAL EXAM:   Vitals:   08/11/19 0932 08/11/19 0935  BP: (!) 104/58 (!) 110/53  Pulse: 74   Resp: 20   Temp: 97.9 F (36.6 C)   SpO2: 96%   Weight: 190 lb (86.2 kg)   Height: 5\' 5"  (1.651 m)     GENERAL: The patient is a well-nourished male, in no acute distress. The vital signs are documented above. CARDIAC: There is a regular rate and rhythm.  VASCULAR: He has bilateral carotid bruits. He has palpable femoral, dorsalis pedis, posterior tibial pulses bilaterally. He has no significant lower extremity swelling. PULMONARY: There is good air exchange bilaterally without wheezing or rales. ABDOMEN: Soft and non-tender with normal pitched  bowel sounds.  I do not palpate an aneurysm although it is difficult to assess. MUSCULOSKELETAL: There are no major deformities or cyanosis. NEUROLOGIC: No focal weakness or paresthesias are  detected. SKIN: There are no ulcers or rashes noted. PSYCHIATRIC: The patient has a normal affect.  DATA:    CAROTID DUPLEX: I have independently interpreted his carotid duplex scan today.  On the right side, there is a 60 to 79% carotid stenosis.  This is stable since the study 6 months ago.  The right vertebral artery is patent with antegrade flow.  On the left side there is a less than 39% stenosis.  The left vertebral artery is patent with antegrade flow.  MEDICAL ISSUES:   RIGHT CAROTID STENOSIS: Is 60 to 79% right carotid stenosis is stable.  He is asymptomatic.  He is on aspirin and is on a statin.  He understands we would not consider right carotid endarterectomy unless the stenosis progressed to greater than 80% or he develop new right hemispheric symptoms.  He has no significant stenosis on the left.  I ordered a follow-up carotid duplex scan in 6 months and I will see him back at that time.  He knows to call sooner if he has problems.  Deitra Mayo [facility] [phone]

## 2019-08-12 ENCOUNTER — Other Ambulatory Visit: Payer: Self-pay | Admitting: *Deleted

## 2019-08-12 DIAGNOSIS — I6523 Occlusion and stenosis of bilateral carotid arteries: Secondary | ICD-10-CM

## 2019-08-16 ENCOUNTER — Encounter: Payer: Self-pay | Admitting: Physical Therapy

## 2019-08-16 ENCOUNTER — Ambulatory Visit: Payer: Medicare PPO | Attending: Family Medicine | Admitting: Physical Therapy

## 2019-08-16 ENCOUNTER — Other Ambulatory Visit: Payer: Self-pay

## 2019-08-16 DIAGNOSIS — M6281 Muscle weakness (generalized): Secondary | ICD-10-CM | POA: Insufficient documentation

## 2019-08-16 DIAGNOSIS — G8929 Other chronic pain: Secondary | ICD-10-CM | POA: Insufficient documentation

## 2019-08-16 DIAGNOSIS — M25562 Pain in left knee: Secondary | ICD-10-CM | POA: Insufficient documentation

## 2019-08-16 DIAGNOSIS — R262 Difficulty in walking, not elsewhere classified: Secondary | ICD-10-CM

## 2019-08-16 DIAGNOSIS — M25561 Pain in right knee: Secondary | ICD-10-CM | POA: Diagnosis present

## 2019-08-16 NOTE — Therapy (Signed)
[facility] [address] [place], [place], [address] Phone: [phone]   Fax:  [phone]  Physical Therapy Treatment  Patient Details  Name: Glen [name] [id number] MRN: [id number] Date of Birth: May 08, 1941 Referring Provider (PT): Dibas Koirala MD   Encounter Date: 08/16/2019  PT End of Session - 08/16/19 0851    Visit Number  4    Date for PT Re-Evaluation  09/03/19    Authorization Type  [employer]    Authorization Time Period  3/8-4/23/21    Authorization - Visit Number  4    Authorization - Number of Visits  12    PT Start Time  0845    PT Stop Time  0926    PT Time Calculation (min)  41 min    Activity Tolerance  Patient tolerated treatment well;Patient limited by pain    Behavior During Therapy  [facility] for tasks assessed/performed       Past Medical History:  Diagnosis Date  . Allergic rhinitis, cause unspecified   . Anxiety   . Asthma   . Cancer ([place]) 07/21/2018   colon  . COPD (chronic obstructive pulmonary disease) ([place])   . Depression   . Dyspnea    upon exertion  . GERD (gastroesophageal reflux disease)   . Hypertension   . PUD (peptic ulcer disease)    avoids aspirin  . Sleep apnea    uses C-PAP    Past Surgical History:  Procedure Laterality Date  . CATARACT EXTRACTION, BILATERAL    . COLONOSCOPY    . LAPAROSCOPIC LYSIS OF ADHESIONS N/A 07/21/2018   Procedure: Laparoscopic Lysis Of Adhesions;  Surgeon: Fanny Skates, MD;  Location: [place];  Service: General;  Laterality: N/A;  . LAPAROSCOPIC PARTIAL COLECTOMY Left 07/21/2018   Procedure: LAPARASCOPIC TAKEDOWN OF SPLENIC FLEXURE; OPEN LEFT COLETCTOMY WITH PRIMARY ANASTOMOSIS;  Surgeon: Fanny Skates, MD;  Location: [place];  Service: General;  Laterality: Left;  . NASAL SEPTUM SURGERY  1960's  . TONSILLECTOMY    . UMBILICAL HERNIA REPAIR N/A 07/21/2018   Procedure: PRIMARY REPAIR OF UMBILICAL HERNIA , ADULT;  Surgeon: Fanny Skates, MD;   Location: [place];  Service: General;  Laterality: N/A;    There were no vitals filed for this visit.  Subjective Assessment - 08/16/19 0849    Subjective  Pt reports signif relief with injecitons last Thurs.  Was able to clean rec room and mow yard over the weekend.  Knees even feel a little better since injecitons.    Limitations  Standing;Walking;Lifting    Diagnostic tests  MRI in past    Patient Stated Goals  Feel better    Currently in Pain?  No/denies    Pain Score  0-No pain    Pain Onset  More than a month ago                       [facility] Adult PT Treatment/Exercise - 08/16/19 0001      Lumbar Exercises: Stretches   Other Lumbar Stretch Exercise  green ball rollouts 5x flexion and flexion with SB seated edge of mat table      Lumbar Exercises: Aerobic   Recumbent Bike  L2 x 6', PT present to discuss STGs and current status      Lumbar Exercises: Seated   LAQ on Chair Limitations  2.5# seated on dynadisc for core challenge    Other Seated Lumbar Exercises  seated hip flexor isometric 5x5 with  TrA contraction    Other Seated Lumbar Exercises  foam roller press downs seated 5x5 sec with TrA cue      Knee/Hip Exercises: Stretches   Active Hamstring Stretch  Both;2 reps;20 seconds    Active Hamstring Stretch Limitations  seated, point toe down    Piriformis Stretch  Both;2 reps;20 seconds    Piriformis Stretch Limitations  edge of mat table      Knee/Hip Exercises: Seated   Clamshell with TheraBand  Red   x 20 seated on dynadisc   Marching  Both;20 reps    Marching Limitations  red band around knees seated on dynadisc             PT Education - 08/16/19 0925    Education Details  Access Code: [email or web address]    Person(s) Educated  Patient    Methods  Explanation;Demonstration    Comprehension  Verbalized understanding;Returned demonstration          PT Long Term Goals - 08/16/19 0852      PT LONG TERM GOAL #1   Title  Pt will be independent in  his HEP and progression.    Baseline  working on HEP compliance    Status  On-going      PT LONG TERM GOAL #3   Title  pt will be albe to amb 1/2 mile with pain </= 3/10 in bilateral knees.    Status  On-going            Plan - 08/16/19 2440    Clinical Impression Statement  Pt with much improved back and knee pain since lumbar injections last week.  Session focused on modifying HEP stretches for improved tolerance and form.  PT challenged core in sitting both on edge of mat and on dynadisc with UE/LE strength layering on top of core stabilization cueing.  Pt with good tolerance of session so PT updated HEP to reflect exercises today.  Pt had not done HEP since last visit so PT encouraged him to try a run through before next visit to see if he had any questions next time.  Pt will continue to benefit from skilled PT along POC.  Pt prefers NuStep to recumbant bike.    Comorbidities  sleep apnea, COPD, anxiety, HTN, CA, asthma, depression, cataracts, hernia repair    Rehab Potential  Good    PT Frequency  2x / week    PT Duration  6 weeks    PT Treatment/Interventions  ADLs/Self Care Home Management;Cryotherapy;Ultrasound;Moist Heat;Iontophoresis 4mg /ml Dexamethasone;Electrical Stimulation;Gait training;Stair training;Functional mobility training;Therapeutic activities;Therapeutic exercise;Balance training;Neuromuscular re-education;Patient/family education;Passive range of motion;Dry needling;Taping;Manual techniques    PT Next Visit Plan  Pt has difficulty lying on his back, NuStep, review HEP, continue trunk ROM, LE strength, add UE tband and dumbbells for trunk stab    PT Home Exercise Plan  Access Code: [email or web address]    Consulted and Agree with Plan of Care  Patient       Patient will benefit from skilled therapeutic intervention in order to improve the following deficits and impairments:     Visit Diagnosis: Chronic pain of left knee  Chronic pain of right knee  Difficulty in  walking, not elsewhere classified  Muscle weakness (generalized)     Problem List Patient Active Problem List   Diagnosis Date Noted  . Colonic mass 07/21/2018  . Obstructive sleep apnea 12/06/2016  . Lung nodules 06/11/2015  . Tobacco use disorder, mild, in sustained remission 01/01/2010  . URTICARIA 01/01/2010  .  DYSPNEA 07/13/2009  . P U D 06/15/2009  . HYPERTENSION 06/08/2009  . ALLERGIC RHINITIS 06/08/2009  . COPD mixed type ([place]) 06/08/2009    Ondrea Dow, PT 08/16/19 9:39 AM   [facility] [address] [place], [place], [address] Phone: [phone]   Fax:  [phone]  Name: MACINTYRE ALEXA MRN: [id number] Date of Birth: 1940-06-06

## 2019-08-16 NOTE — Progress Notes (Signed)
The purpose of this virtual visit is to provide medical care while limiting exposure to the novel coronavirus.    Consent was obtained for video visit:  Yes.   Answered questions that patient had about telehealth interaction:  Yes.   I discussed the limitations, risks, security and privacy concerns of performing an evaluation and management service by telemedicine. I also discussed with the patient that there may be a patient responsible charge related to this service. The patient expressed understanding and agreed to proceed.  Pt location: Home Physician Location: home Name of referring provider:  [name], MD I connected with Glen [name] at patients initiation/request on 08/18/2019 at  9:45 AM EDT by video enabled telemedicine application and verified that I am speaking with the correct person using two identifiers. Pt MRN:  [id number] Pt DOB:  Sep 05, 1940 Video Participants:  Safi Culotta DTOIZTI  Assessment/Plan:    1.  Essential Tremor  -Doing really well with primidone, 50 mg at night.  Discussed whether we should increase it, but decided not to.  2.  Peripheral neuropathy  -Continue gabapentin, 100 mg in the morning and 600 mg at night   3.  Carotid stenosis, right  -Following with vascular surgery.  Last saw them on August 11, 2019.  Has stable 60 to 79% stenosis on the right, and less than 39% stenosis on the left.  4.  Colon cancer, stage II  -Following with oncology.  No chemo needed 5.  F/u 9 months  Subjective:   Glen [name] was seen today in follow up for essential tremor.   my previous records were reviewed prior to todays visit.  Just started on primidone last visit.  He reports that "it has done really well."  He does have tremor if he gets under "mental or physical pressure."  pt denies falls.  Pt denies lightheadedness, near syncope.  No hallucinations.  Mood has been good.  Does c/o "my toes being hot and cold." saw vascular and told is "A okay."   States that he is now borderline DM.  Current prescribed movement disorder medications: Gabapentin, 100 mg in the morning, 600 mg at night Primidone, 50 mg at night    ALLERGIES:   Allergies  Allergen Reactions  . Aspirin Other (See Comments)    pt has bleeding ulcers  . Lisinopril Cough    CURRENT MEDICATIONS:  Outpatient Encounter Medications as of 08/18/2019  Medication Sig  . acetaminophen (TYLENOL) 650 MG CR tablet Take 1,950 mg by mouth at bedtime.  . ALPRAZolam (XANAX) 0.25 MG tablet Take 0.25 mg by mouth daily as needed for anxiety.   [name] amLODipine (NORVASC) 10 MG tablet Take 10 mg by mouth every evening.   [name] aspirin EC 81 MG tablet Take 81 mg by mouth at bedtime.   [name] atorvastatin (LIPITOR) 40 MG tablet Take 40 mg by mouth at bedtime.   . diphenhydrAMINE (BENADRYL) 25 MG tablet Take 75 mg by mouth at bedtime.   . gabapentin (NEURONTIN) 100 MG capsule TAKE 1 CAPSULE BY MOUTH IN THE MORNING AND 3 CAPSULES IN THE EVENING (Patient taking differently: 100 mg. TAKE 1 CAPSULE BY MOUTH)  . gabapentin (NEURONTIN) 300 MG capsule Take 2 capsules (600 mg total) by mouth at bedtime.  . hydrochlorothiazide (MICROZIDE) 12.5 MG capsule Take 12.5 mg by mouth daily.   [name] levalbuterol (XOPENEX) 0.63 MG/3ML nebulizer solution USE 1 VIAL VIA NEBULIZER EVERY 4 HOURS AS NEEDED FOR WHEEZING OR SHORTNESS OF BREATH  .  omeprazole (PRILOSEC) 20 MG capsule Take 20 mg by mouth daily.    . primidone (MYSOLINE) 50 MG tablet Take 1 tablet (50 mg total) by mouth at bedtime.  [name] PROAIR HFA 108 (90 Base) MCG/ACT inhaler INHALE 2 PUFFS INTO THE LUNGS EVERY 4 HOURS AS NEEDED FOR WHEEZING OR SHORTNESS OF BREATH.  . sertraline (ZOLOFT) 100 MG tablet Take 100 mg by mouth daily.   [name] telmisartan (MICARDIS) 40 MG tablet Take 40 mg by mouth daily.  . TRELEGY ELLIPTA 100-62.5-25 MCG/INH AEPB INHALE 1 PUFF INTO THE LUNGS DAILY  . vitamin B-12 (CYANOCOBALAMIN) 1000 MCG tablet Take 1,000 mcg by mouth daily.   No  facility-administered encounter medications on file as of 08/18/2019.     Objective:    PHYSICAL EXAMINATION:    VITALS:   Vitals:   08/18/19 0832  Weight: 187 lb (84.8 kg)  Height: 5\' 5"  (1.651 m)    GEN:  The patient appears stated age and is in NAD. HEENT:  Normocephalic, atraumatic.    Neurological examination:  Orientation: The patient is alert and oriented x3. Cranial nerves: There is good facial symmetry. The speech is fluent and clear.  Hearing is intact to conversational tone. Motor: Strength is at least antigravity x4.  Movement examination: Abnormal movements: No tremor of the outstretched hands is noted.  No intention tremor.   Cc:  [name], MD

## 2019-08-16 NOTE — Patient Instructions (Signed)
Access Code: [email or web address]: 04/05/2021Prepared by: Venetia Night BeuhringExercises  Seated Hamstring Stretch - 1 x daily - 7 x weekly - 1 sets - 2 reps - 20 hold  Seated Table Piriformis Stretch - 1 x daily - 7 x weekly - 1 sets - 2 reps - 20 hold  Seated Long Arc Quad - 2 x daily - 7 x weekly - 15 reps - 5 seconds hold  Seated Hip Abduction with Resistance - 1 x daily - 7 x weekly - 10 reps - 3 sets  Seated Hip Flexion - 1 x daily - 7 x weekly - 3 sets - 10 reps  Sit to Stand with Hands on Knees - 3 x daily - 7 x weekly - 1 sets - 5 reps  Seated Passive Cervical Retraction - 2 x daily - 7 x weekly - 1 sets - 10 reps - 5 hold  Seated Cervical Retraction and Rotation - 2 x daily - 7 x weekly - 1 sets - 10 reps  Seated Shoulder Rolls - 2 x daily - 7 x weekly - 1 sets - 10 reps

## 2019-08-18 ENCOUNTER — Encounter: Payer: Self-pay | Admitting: Neurology

## 2019-08-18 ENCOUNTER — Telehealth (INDEPENDENT_AMBULATORY_CARE_PROVIDER_SITE_OTHER): Payer: Medicare PPO | Admitting: Neurology

## 2019-08-18 ENCOUNTER — Other Ambulatory Visit: Payer: Self-pay

## 2019-08-18 VITALS — Ht 65.0 in | Wt 187.0 lb

## 2019-08-18 DIAGNOSIS — G250 Essential tremor: Secondary | ICD-10-CM

## 2019-08-19 ENCOUNTER — Ambulatory Visit: Payer: Medicare PPO | Admitting: Physical Therapy

## 2019-08-19 ENCOUNTER — Encounter: Payer: Self-pay | Admitting: Physical Therapy

## 2019-08-19 DIAGNOSIS — M6281 Muscle weakness (generalized): Secondary | ICD-10-CM

## 2019-08-19 DIAGNOSIS — M25562 Pain in left knee: Secondary | ICD-10-CM

## 2019-08-19 DIAGNOSIS — M25561 Pain in right knee: Secondary | ICD-10-CM

## 2019-08-19 DIAGNOSIS — G8929 Other chronic pain: Secondary | ICD-10-CM

## 2019-08-19 DIAGNOSIS — R262 Difficulty in walking, not elsewhere classified: Secondary | ICD-10-CM

## 2019-08-19 NOTE — Therapy (Signed)
[facility] [address] [place], [place], [address] Phone: [phone]   Fax:  [phone]  Physical Therapy Treatment  Patient Details  Name: Glen [name] MWNUUVO MRN: [id number] Date of Birth: 07/05/1940 Referring Provider (PT): Dibas Koirala MD   Encounter Date: 08/19/2019  PT End of Session - 08/19/19 0851    Visit Number  5    Number of Visits  13    Date for PT Re-Evaluation  09/03/19    Authorization Type  [employer]    Authorization Time Period  3/8-4/23/21    Authorization - Visit Number  5    Authorization - Number of Visits  12    PT Start Time  7425    PT Stop Time  0930    PT Time Calculation (min)  43 min    Activity Tolerance  Patient tolerated treatment well;Patient limited by pain    Behavior During Therapy  [facility] for tasks assessed/performed       Past Medical History:  Diagnosis Date  . Allergic rhinitis, cause unspecified   . Anxiety   . Asthma   . Cancer ([place]) 07/21/2018   colon  . COPD (chronic obstructive pulmonary disease) ([place])   . Depression   . Dyspnea    upon exertion  . GERD (gastroesophageal reflux disease)   . Hypertension   . PUD (peptic ulcer disease)    avoids aspirin  . Sleep apnea    uses C-PAP    Past Surgical History:  Procedure Laterality Date  . CATARACT EXTRACTION, BILATERAL    . COLONOSCOPY    . LAPAROSCOPIC LYSIS OF ADHESIONS N/A 07/21/2018   Procedure: Laparoscopic Lysis Of Adhesions;  Surgeon: Fanny Skates, MD;  Location: [place];  Service: General;  Laterality: N/A;  . LAPAROSCOPIC PARTIAL COLECTOMY Left 07/21/2018   Procedure: LAPARASCOPIC TAKEDOWN OF SPLENIC FLEXURE; OPEN LEFT COLETCTOMY WITH PRIMARY ANASTOMOSIS;  Surgeon: Fanny Skates, MD;  Location: [place];  Service: General;  Laterality: Left;  . NASAL SEPTUM SURGERY  1960's  . TONSILLECTOMY    . UMBILICAL HERNIA REPAIR N/A 07/21/2018   Procedure: PRIMARY REPAIR OF UMBILICAL HERNIA , ADULT;  Surgeon:  Fanny Skates, MD;  Location: [place];  Service: General;  Laterality: N/A;    There were no vitals filed for this visit.  Subjective Assessment - 08/19/19 0850    Subjective  Patient reporting 5/10 back and 9/10 knee pain today after being very active yesterday doing yard work. He says he didn't do his exercises yesterday due to doing mowing and edging.    Currently in Pain?  Yes    Pain Score  5     Pain Location  Back    Pain Orientation  Right    Pain Descriptors / Indicators  Aching    Pain Type  Chronic pain    Multiple Pain Sites  Yes    Pain Score  9    Pain Location  Knee    Pain Orientation  Right;Left    Pain Descriptors / Indicators  Aching    Pain Type  Chronic pain                       [facility] Adult PT Treatment/Exercise - 08/19/19 0001      Self-Care   Self-Care  Other Self-Care Comments    Other Self-Care Comments   self MFR with roller      Lumbar Exercises: Stretches   Other Lumbar Stretch  Exercise  green ball rollouts 10x flexion and flexion with SB seated edge of mat table      Lumbar Exercises: Aerobic   Nustep  L1 x 6 min      Lumbar Exercises: Seated   LAQ on Chair Limitations  2.5# seated on dynadisc for core challenge    Other Seated Lumbar Exercises  seated hip flexor isometric 5x5 with TrA contraction    Other Seated Lumbar Exercises  OH reach with head fllowing x 10      Knee/Hip Exercises: Stretches   Active Hamstring Stretch  Both;2 reps;30 seconds    Active Hamstring Stretch Limitations  seated, point toe down    Piriformis Stretch  Both;2 reps;30 seconds    Piriformis Stretch Limitations  edge of mat table      Knee/Hip Exercises: Seated   Clamshell with TheraBand  Red   x 20 seated on dynadisc   Marching  Both;20 reps    Marching Limitations  red band around knees seated on dynadisc    Sit to Sand  2 sets;5 reps;without UE support   11 sec            PT Education - 08/19/19 1722    Education Details  Self MFR  with roller for quads; DN handout    Person(s) Educated  Patient    Methods  Explanation;Demonstration;Handout    Comprehension  Verbalized understanding;Returned demonstration          PT Long Term Goals - 08/16/19 7169      PT LONG TERM GOAL #1   Title  Pt will be independent in his HEP and progression.    Baseline  working on HEP compliance    Status  On-going      PT LONG TERM GOAL #3   Title  pt will be albe to amb 1/2 mile with pain </= 3/10 in bilateral knees.    Status  On-going            Plan - 08/19/19 1723    Clinical Impression Statement  Patient presents today with continued high level knee pain after working in the yard yesterday. He did well with exercises today with some c/o pain in the low back with resisted hip flexion. We discussed his knee pain and plan to try DN next visit to address this. Info provided to pt today as well as education on self MFR.    Comorbidities  sleep apnea, COPD, anxiety, HTN, CA, asthma, depression, cataracts, hernia repair    PT Treatment/Interventions  ADLs/Self Care Home Management;Cryotherapy;Ultrasound;Moist Heat;Iontophoresis 4mg /ml Dexamethasone;Electrical Stimulation;Gait training;Stair training;Functional mobility training;Therapeutic activities;Therapeutic exercise;Balance training;Neuromuscular re-education;Patient/family education;Passive range of motion;Dry needling;Taping;Manual techniques    PT Next Visit Plan  manual therapy and DN to bil quads; Pt has difficulty lying on his back, NuStep, review HEP, continue trunk ROM, LE strength, add UE tband and dumbbells for trunk stab    Consulted and Agree with Plan of Care  Patient       Patient will benefit from skilled therapeutic intervention in order to improve the following deficits and impairments:  Pain, Postural dysfunction, Decreased activity tolerance, Decreased strength, Decreased range of motion, Difficulty walking  Visit Diagnosis: Chronic pain of left  knee  Chronic pain of right knee  Difficulty in walking, not elsewhere classified  Muscle weakness (generalized)     Problem List Patient Active Problem List   Diagnosis Date Noted  . Colonic mass 07/21/2018  . Obstructive sleep apnea 12/06/2016  . Lung  nodules 06/11/2015  . Tobacco use disorder, mild, in sustained remission 01/01/2010  . URTICARIA 01/01/2010  . DYSPNEA 07/13/2009  . P U D 06/15/2009  . HYPERTENSION 06/08/2009  . ALLERGIC RHINITIS 06/08/2009  . COPD mixed type ([place]) 06/08/2009   Madelyn Flavors PT 08/19/2019, 5:28 PM  [facility] [address] [place], [place], [address] Phone: [phone]   Fax:  [phone]  Name: Glen [name] MRN: [id number] Date of Birth: [birth year]

## 2019-08-24 ENCOUNTER — Other Ambulatory Visit: Payer: Self-pay

## 2019-08-24 ENCOUNTER — Ambulatory Visit: Payer: Medicare PPO | Admitting: Physical Therapy

## 2019-08-24 ENCOUNTER — Encounter: Payer: Self-pay | Admitting: Physical Therapy

## 2019-08-24 DIAGNOSIS — M25562 Pain in left knee: Secondary | ICD-10-CM | POA: Diagnosis not present

## 2019-08-24 DIAGNOSIS — R262 Difficulty in walking, not elsewhere classified: Secondary | ICD-10-CM

## 2019-08-24 DIAGNOSIS — M6281 Muscle weakness (generalized): Secondary | ICD-10-CM

## 2019-08-24 DIAGNOSIS — G8929 Other chronic pain: Secondary | ICD-10-CM

## 2019-08-24 NOTE — Therapy (Signed)
[facility] [address] [place], [place], [address] Phone: [phone]   Fax:  [phone]  Physical Therapy Treatment  Patient Details  Name: Glen [name] FGHWEXH MRN: [id number] Date of Birth: May 18, 1940 Referring Provider (PT): Dibas Koirala MD   Encounter Date: 08/24/2019  PT End of Session - 08/24/19 0850    Visit Number  6    Number of Visits  13    Date for PT Re-Evaluation  09/03/19    Authorization Type  [employer]    Authorization Time Period  3/8-4/23/21    Authorization - Visit Number  6    Authorization - Number of Visits  12    PT Start Time  3810    PT Stop Time  0929    PT Time Calculation (min)  42 min    Activity Tolerance  Patient tolerated treatment well;Patient limited by pain    Behavior During Therapy  [facility] for tasks assessed/performed       Past Medical History:  Diagnosis Date  . Allergic rhinitis, cause unspecified   . Anxiety   . Asthma   . Cancer ([place]) 07/21/2018   colon  . COPD (chronic obstructive pulmonary disease) ([place])   . Depression   . Dyspnea    upon exertion  . GERD (gastroesophageal reflux disease)   . Hypertension   . PUD (peptic ulcer disease)    avoids aspirin  . Sleep apnea    uses C-PAP    Past Surgical History:  Procedure Laterality Date  . CATARACT EXTRACTION, BILATERAL    . COLONOSCOPY    . LAPAROSCOPIC LYSIS OF ADHESIONS N/A 07/21/2018   Procedure: Laparoscopic Lysis Of Adhesions;  Surgeon: Fanny Skates, MD;  Location: [place];  Service: General;  Laterality: N/A;  . LAPAROSCOPIC PARTIAL COLECTOMY Left 07/21/2018   Procedure: LAPARASCOPIC TAKEDOWN OF SPLENIC FLEXURE; OPEN LEFT COLETCTOMY WITH PRIMARY ANASTOMOSIS;  Surgeon: Fanny Skates, MD;  Location: [place];  Service: General;  Laterality: Left;  . NASAL SEPTUM SURGERY  1960's  . TONSILLECTOMY    . UMBILICAL HERNIA REPAIR N/A 07/21/2018   Procedure: PRIMARY REPAIR OF UMBILICAL HERNIA , ADULT;  Surgeon:  Fanny Skates, MD;  Location: [place];  Service: General;  Laterality: N/A;    There were no vitals filed for this visit.  Subjective Assessment - 08/24/19 0850    Subjective  Patient reporting pain in knees is mainly with steps and sit to stand.    Pertinent History  Colon Cancer 07/2018    Patient Stated Goals  Feel better    Currently in Pain?  Yes    Pain Score  6     Pain Location  Back    Pain Orientation  Right    Pain Descriptors / Indicators  Aching    Pain Type  Chronic pain                       [facility] Adult PT Treatment/Exercise - 08/24/19 0001      Lumbar Exercises: Aerobic   Recumbent Bike  L2 x 6 min      Knee/Hip Exercises: Stretches   Active Hamstring Stretch  Both;2 reps;20 seconds    Active Hamstring Stretch Limitations  seated, point toe down    Quad Stretch  Left;1 rep    Quad Stretch Limitations  passive post manual therapy    Piriformis Stretch  Both;2 reps;30 seconds    Piriformis Stretch Limitations  edge of mat  table    Other Knee/Hip Stretches  SDLY ITB stretch left only x 30 sec      Knee/Hip Exercises: Seated   Sit to Sand  2 sets;5 reps;without UE support   11 sec     Manual Therapy   Manual Therapy  Soft tissue mobilization    Manual therapy comments  skilled palpation and monitoring of soft tissues during DN    Soft tissue mobilization  STW and IASTM to bil quadriceps and ITB       Trigger Point Dry Needling - 08/24/19 0001    Consent Given?  Yes    Education Handout Provided  Previously provided    Muscles Treated Lower Quadrant  Quadriceps    Dry Needling Comments  bil    Quadriceps Response  Twitch response elicited;Palpable increased muscle length                PT Long Term Goals - 08/24/19 1654      PT LONG TERM GOAL #1   Title  Pt will be independent in his HEP and progression.    Status  On-going      PT LONG TERM GOAL #2   Title  Pt will improve his 5 time sit to stand to </= 12 seconds.     Baseline  11 sec    Status  Achieved            Plan - 08/24/19 1657    Clinical Impression Statement  Patient reporting less knee pain today at rest. He has met his sit to stand LTG and demos no LOB or compensation. He tolerated DN and manual therapy very well today to bil quads and was able to perform multiple sit to stands at end of treatment without pain. Remaining LTGs are ongoing.    Comorbidities  sleep apnea, COPD, anxiety, HTN, CA, asthma, depression, cataracts, hernia repair, colon CA (2020)    PT Frequency  2x / week    PT Duration  6 weeks    PT Treatment/Interventions  ADLs/Self Care Home Management;Cryotherapy;Ultrasound;Moist Heat;Iontophoresis 49m/ml Dexamethasone;Electrical Stimulation;Gait training;Stair training;Functional mobility training;Therapeutic activities;Therapeutic exercise;Balance training;Neuromuscular re-education;Patient/family education;Passive range of motion;Dry needling;Taping;Manual techniques    PT Next Visit Plan  Assess DN #1 to quads, add quad/ITB stretch to HEP, NuStep, review HEP, continue trunk ROM, LE strength, add UE tband and dumbbells for trunk stab    PT Home Exercise Plan  Access Code: [email or web address]   Consulted and Agree with Plan of Care  Patient       Patient will benefit from skilled therapeutic intervention in order to improve the following deficits and impairments:  Pain, Postural dysfunction, Decreased activity tolerance, Decreased strength, Decreased range of motion, Difficulty walking  Visit Diagnosis: Chronic pain of left knee  Chronic pain of right knee  Difficulty in walking, not elsewhere classified  Muscle weakness (generalized)     Problem List Patient Active Problem List   Diagnosis Date Noted  . Colonic mass 07/21/2018  . Obstructive sleep apnea 12/06/2016  . Lung nodules 06/11/2015  . Tobacco use disorder, mild, in sustained remission 01/01/2010  . URTICARIA 01/01/2010  . DYSPNEA 07/13/2009  . P U D  06/15/2009  . HYPERTENSION 06/08/2009  . ALLERGIC RHINITIS 06/08/2009  . COPD [place] type ([place] 06/08/2009   JMadelyn FlavorsPT 08/24/2019, 5:02 PM  [facility] [address] [place] [address]: [phone]  Fax:  [phone] Name: WAdrick KestlerMVEHMCNO  MRN: [id number] Date of Birth: 1940/06/17

## 2019-08-26 ENCOUNTER — Other Ambulatory Visit: Payer: Self-pay

## 2019-08-26 ENCOUNTER — Ambulatory Visit: Payer: Medicare PPO | Admitting: Physical Therapy

## 2019-08-26 ENCOUNTER — Encounter: Payer: Self-pay | Admitting: Physical Therapy

## 2019-08-26 DIAGNOSIS — R262 Difficulty in walking, not elsewhere classified: Secondary | ICD-10-CM

## 2019-08-26 DIAGNOSIS — M6281 Muscle weakness (generalized): Secondary | ICD-10-CM

## 2019-08-26 DIAGNOSIS — G8929 Other chronic pain: Secondary | ICD-10-CM

## 2019-08-26 DIAGNOSIS — M25562 Pain in left knee: Secondary | ICD-10-CM | POA: Diagnosis not present

## 2019-08-26 NOTE — Therapy (Signed)
[facility] [address] [place], [place], [address] Phone: [phone]   Fax:  [phone]  Physical Therapy Treatment  Patient Details  Name: Glen [name] GDJMEQA MRN: [id number] Date of Birth: June 03, 1940 Referring Provider (PT): Dibas Koirala MD   Encounter Date: 08/26/2019  PT End of Session - 08/26/19 0846    Visit Number  7    Authorization Time Period  3/8-4/23/21    Authorization - Visit Number  7    Authorization - Number of Visits  12    PT Start Time  0845    PT Stop Time  0928    PT Time Calculation (min)  43 min    Activity Tolerance  Patient tolerated treatment well;Patient limited by pain    Behavior During Therapy  [facility] for tasks assessed/performed       Past Medical History:  Diagnosis Date  . Allergic rhinitis, cause unspecified   . Anxiety   . Asthma   . Cancer ([place]) 07/21/2018   colon  . COPD (chronic obstructive pulmonary disease) ([place])   . Depression   . Dyspnea    upon exertion  . GERD (gastroesophageal reflux disease)   . Hypertension   . PUD (peptic ulcer disease)    avoids aspirin  . Sleep apnea    uses C-PAP    Past Surgical History:  Procedure Laterality Date  . CATARACT EXTRACTION, BILATERAL    . COLONOSCOPY    . LAPAROSCOPIC LYSIS OF ADHESIONS N/A 07/21/2018   Procedure: Laparoscopic Lysis Of Adhesions;  Surgeon: Fanny Skates, MD;  Location: [place];  Service: General;  Laterality: N/A;  . LAPAROSCOPIC PARTIAL COLECTOMY Left 07/21/2018   Procedure: LAPARASCOPIC TAKEDOWN OF SPLENIC FLEXURE; OPEN LEFT COLETCTOMY WITH PRIMARY ANASTOMOSIS;  Surgeon: Fanny Skates, MD;  Location: [place];  Service: General;  Laterality: Left;  . NASAL SEPTUM SURGERY  1960's  . TONSILLECTOMY    . UMBILICAL HERNIA REPAIR N/A 07/21/2018   Procedure: PRIMARY REPAIR OF UMBILICAL HERNIA , ADULT;  Surgeon: Fanny Skates, MD;  Location: [place];  Service: General;  Laterality: N/A;    There were no  vitals filed for this visit.  Subjective Assessment - 08/26/19 0846    Subjective  Patient was very sore yesterday after DN, but better today.    Limitations  Standing;Walking;Lifting    Patient Stated Goals  Feel better    Currently in Pain?  Yes    Pain Score  3     Pain Location  Back    Pain Orientation  Right    Pain Descriptors / Indicators  Aching    Pain Score  3    Pain Location  Knee    Pain Orientation  Right;Left    Pain Descriptors / Indicators  Aching    Pain Type  Chronic pain                       [facility] Adult PT Treatment/Exercise - 08/26/19 0001      Therapeutic Activites    Therapeutic Activities  Other Therapeutic Activities    Other Therapeutic Activities  worked on kneeling and 1/2 kneeling on foam. Good strength and able to do without difficulty.       Lumbar Exercises: Aerobic   Nustep  L1 x 6 min      Knee/Hip Exercises: Stretches   Active Hamstring Stretch  Both;2 reps;20 seconds    Active Hamstring Stretch Limitations  seated, point toe  down    Sports administrator  Both;1 rep;60 seconds    Quad Stretch Limitations  prone with strap    Hip Flexor Stretch  Both;1 rep;30 seconds    Hip Flexor Stretch Limitations  with knee on mat and hand on chair    Piriformis Stretch  Both;2 reps;30 seconds    Piriformis Stretch Limitations  edge of mat table    Other Knee/Hip Stretches  SDLY ITB stretch both x  60 sec    Other Knee/Hip Stretches  standing dynamic quad stretch: HS curl with thigh against surface to avoid hip flexion       Knee/Hip Exercises: Standing   Hip Flexion  Both;10 reps      Knee/Hip Exercises: Seated   Marching  Both;20 reps    Marching Limitations  attempted with 3#; pain in back; added 5 sec hold with no wt       Manual Therapy   Manual Therapy  Soft tissue mobilization    Soft tissue mobilization  IASTM to distal quads bil                  PT Long Term Goals - 08/26/19 0910      PT LONG TERM GOAL #1    Title  Pt will be independent in his HEP and progression.    Baseline  3-4 x/wk    Status  Partially Met      PT LONG TERM GOAL #2   Title  Pt will improve his 5 time sit to stand to </= 12 seconds.    Status  Achieved      PT LONG TERM GOAL #3   Title  pt will be albe to amb 1/2 mile with pain </= 3/10 in bilateral knees.    Status  On-going      PT LONG TERM GOAL #4   Title  Pt will be able to pick up objects from the floor using correct body mechanics with knee pain </= 4/10.    Baseline  0/10 today    Status  Partially Met            Plan - 08/26/19 0930    Clinical Impression Statement  Patient reported a lot of soreness after DN/manual, but he is better today.  Patient is progressing with goals and pain is his biggest deficit right now. He met his kneeling goal. PT advised he raise up in steps to avoid dizziness. His strength is good here. He reports intermittent pain in bil glut med with walking which then progresses down legs. He would benefit from DN here and in his quads again.    Comorbidities  sleep apnea, COPD, anxiety, HTN, CA, asthma, depression, cataracts, hernia repair, colon CA (2020)    PT Frequency  2x / week    PT Duration  6 weeks    PT Treatment/Interventions  ADLs/Self Care Home Management;Cryotherapy;Ultrasound;Moist Heat;Iontophoresis 63m/ml Dexamethasone;Electrical Stimulation;Gait training;Stair training;Functional mobility training;Therapeutic activities;Therapeutic exercise;Balance training;Neuromuscular re-education;Patient/family education;Passive range of motion;Dry needling;Taping;Manual techniques    PT Next Visit Plan  DN to quads and gluteals bil, add quad/ITB stretch to HEP, NuStep, review HEP    PT Home Exercise Plan  Access Code: [email or web address]   Consulted and Agree with Plan of Care  Patient       Patient will benefit from skilled therapeutic intervention in order to improve the following deficits and impairments:  Pain, Postural dysfunction,  Decreased activity tolerance, Decreased strength, Decreased range of motion, Difficulty walking  Visit Diagnosis: Chronic pain of left knee  Chronic pain of right knee  Difficulty in walking, not elsewhere classified  Muscle weakness (generalized)     Problem List Patient Active Problem List   Diagnosis Date Noted  . Colonic mass 07/21/2018  . Obstructive sleep apnea 12/06/2016  . Lung nodules 06/11/2015  . Tobacco use disorder, mild, in sustained remission 01/01/2010  . URTICARIA 01/01/2010  . DYSPNEA 07/13/2009  . P U D 06/15/2009  . HYPERTENSION 06/08/2009  . ALLERGIC RHINITIS 06/08/2009  . COPD mixed type ([place]) 06/08/2009    Madelyn Flavors PT 08/26/2019, 4:58 PM  [facility] [address] [place], [place], [address] Phone: [phone]   Fax:  [phone]  Name: KEIJI MELLAND MRN: [id number] Date of Birth: 11-21-1940

## 2019-08-30 ENCOUNTER — Ambulatory Visit: Payer: Medicare PPO | Admitting: Physical Therapy

## 2019-08-30 ENCOUNTER — Other Ambulatory Visit: Payer: Self-pay

## 2019-08-30 ENCOUNTER — Encounter: Payer: Self-pay | Admitting: Physical Therapy

## 2019-08-30 DIAGNOSIS — M6281 Muscle weakness (generalized): Secondary | ICD-10-CM

## 2019-08-30 DIAGNOSIS — M25562 Pain in left knee: Secondary | ICD-10-CM | POA: Diagnosis not present

## 2019-08-30 DIAGNOSIS — G8929 Other chronic pain: Secondary | ICD-10-CM

## 2019-08-30 DIAGNOSIS — R262 Difficulty in walking, not elsewhere classified: Secondary | ICD-10-CM

## 2019-08-30 NOTE — Therapy (Signed)
[facility] [address] [place], [place], [address] Phone: [phone]   Fax:  [phone]  Physical Therapy Treatment  Patient Details  Name: Glen [name] XMIWOEH MRN: [id number] Date of Birth: 1940-06-04 Referring Provider (PT): Dibas Koirala MD   Encounter Date: 08/30/2019  PT End of Session - 08/30/19 0852    Visit Number  8    Number of Visits  13    Date for PT Re-Evaluation  09/03/19    Authorization Type  [employer]    Authorization Time Period  3/8-4/23/21    Authorization - Visit Number  8    Authorization - Number of Visits  12    PT Start Time  0845    PT Stop Time  0925    PT Time Calculation (min)  40 min    Activity Tolerance  Patient tolerated treatment well;Patient limited by pain    Behavior During Therapy  [facility] for tasks assessed/performed       Past Medical History:  Diagnosis Date  . Allergic rhinitis, cause unspecified   . Anxiety   . Asthma   . Cancer ([place]) 07/21/2018   colon  . COPD (chronic obstructive pulmonary disease) ([place])   . Depression   . Dyspnea    upon exertion  . GERD (gastroesophageal reflux disease)   . Hypertension   . PUD (peptic ulcer disease)    avoids aspirin  . Sleep apnea    uses C-PAP    Past Surgical History:  Procedure Laterality Date  . CATARACT EXTRACTION, BILATERAL    . COLONOSCOPY    . LAPAROSCOPIC LYSIS OF ADHESIONS N/A 07/21/2018   Procedure: Laparoscopic Lysis Of Adhesions;  Surgeon: Fanny Skates, MD;  Location: [place];  Service: General;  Laterality: N/A;  . LAPAROSCOPIC PARTIAL COLECTOMY Left 07/21/2018   Procedure: LAPARASCOPIC TAKEDOWN OF SPLENIC FLEXURE; OPEN LEFT COLETCTOMY WITH PRIMARY ANASTOMOSIS;  Surgeon: Fanny Skates, MD;  Location: [place];  Service: General;  Laterality: Left;  . NASAL SEPTUM SURGERY  1960's  . TONSILLECTOMY    . UMBILICAL HERNIA REPAIR N/A 07/21/2018   Procedure: PRIMARY REPAIR OF UMBILICAL HERNIA , ADULT;  Surgeon:  Fanny Skates, MD;  Location: [place];  Service: General;  Laterality: N/A;    There were no vitals filed for this visit.  Subjective Assessment - 08/30/19 0849    Subjective  I am doing much better. Pain is less than 5/10.  I was able to work on hands and knees x 4 hours yesterday and am getting up/down the stairs more easily.    Pertinent History  Colon Cancer 07/2018    Limitations  Standing;Walking;Lifting    Diagnostic tests  MRI in past    Patient Stated Goals  Feel better    Currently in Pain?  Yes    Pain Score  4     Pain Location  Back    Pain Orientation  Right    Pain Descriptors / Indicators  Aching    Pain Onset  More than a month ago    Pain Frequency  Constant    Effect of Pain on Daily Activities  improving activty tolerance    Pain Score  4    Pain Location  Back    Pain Orientation  Right;Left    Pain Descriptors / Indicators  Aching    Pain Type  Chronic pain  [place] Adult PT Treatment/Exercise - 08/30/19 0001      Lumbar Exercises: Aerobic   Nustep  L3 x 6', PT present to discuss progress      Manual Therapy   Manual Therapy  Soft tissue mobilization    Soft tissue mobilization  STM to all dry needling sites after DN       Trigger Point Dry Needling - 08/30/19 0001    Consent Given?  Yes    Education Handout Provided  Previously provided    Muscles Treated Lower Quadrant  Quadriceps    Muscles Treated Back/Hip  Gluteus medius;Piriformis    Dry Needling Comments  all bil    Quadriceps Response  Twitch response elicited;Palpable increased muscle length    Gluteus Medius Response  Twitch response elicited;Palpable increased muscle length    Piriformis Response  Twitch response elicited;Palpable increased muscle length                PT Long Term Goals - 08/26/19 0910      PT LONG TERM GOAL #1   Title  Pt will be independent in his HEP and progression.    Baseline  3-4 x/wk    Status  Partially Met       PT LONG TERM GOAL #2   Title  Pt will improve his 5 time sit to stand to </= 12 seconds.    Status  Achieved      PT LONG TERM GOAL #3   Title  pt will be albe to amb 1/2 mile with pain </= 3/10 in bilateral knees.    Status  On-going      PT LONG TERM GOAL #4   Title  Pt will be able to pick up objects from the floor using correct body mechanics with knee pain </= 4/10.    Baseline  0/10 today    Status  Partially Met            Plan - 08/30/19 0853    Clinical Impression Statement  Pt was very pleased with increased activity tolerance and ease with mobility over the weekend.  He is due to re-eval next visit and reports he would like to be d/c'd and will try to manage through his HEP.  Session focused today on DN to bil quads and lateral hips with signif twitch and release response for all.  PT encouraged Pt to drink water and do gentle mobility throughout the day for aftercare.  Pt will benefit from review of HEP next visit for anticipated d/c to HEP per Pt's request today.    Comorbidities  sleep apnea, COPD, anxiety, HTN, CA, asthma, depression, cataracts, hernia repair, colon CA (2020)    Examination-Activity Limitations  Squat;Stairs;Stand;Carry    Stability/Clinical Decision Making  Stable/Uncomplicated    Rehab Potential  Good    PT Frequency  2x / week    PT Duration  6 weeks    PT Treatment/Interventions  ADLs/Self Care Home Management;Cryotherapy;Ultrasound;Moist Heat;Iontophoresis '4mg'$ /ml Dexamethasone;Electrical Stimulation;Gait training;Stair training;Functional mobility training;Therapeutic activities;Therapeutic exercise;Balance training;Neuromuscular re-education;Patient/family education;Passive range of motion;Dry needling;Taping;Manual techniques    PT Next Visit Plan  re-eval and d/c to HEP per Pt request discussed at today's visit    PT Home Exercise Plan  Access Code: [email or web address]    Consulted and Agree with Plan of Care  Patient       Patient will benefit from  skilled therapeutic intervention in order to improve the following deficits and impairments:  Pain, Postural dysfunction, Decreased activity  tolerance, Decreased strength, Decreased range of motion, Difficulty walking  Visit Diagnosis: Chronic pain of left knee  Chronic pain of right knee  Difficulty in walking, not elsewhere classified  Muscle weakness (generalized)     Problem List Patient Active Problem List   Diagnosis Date Noted  . Colonic mass 07/21/2018  . Obstructive sleep apnea 12/06/2016  . Lung nodules 06/11/2015  . Tobacco use disorder, mild, in sustained remission 01/01/2010  . URTICARIA 01/01/2010  . DYSPNEA 07/13/2009  . P U D 06/15/2009  . HYPERTENSION 06/08/2009  . ALLERGIC RHINITIS 06/08/2009  . COPD mixed type ([place]) 06/08/2009    Lev Cervone, PT 08/30/19 9:25 AM   [facility] [address] [place], [place], [address] Phone: [phone]   Fax:  [phone]  Name: NASIR BRIGHT MRN: [id number] Date of Birth: [birth year]

## 2019-09-02 ENCOUNTER — Ambulatory Visit: Payer: Medicare PPO | Admitting: Physical Therapy

## 2019-09-02 ENCOUNTER — Other Ambulatory Visit: Payer: Self-pay

## 2019-09-02 ENCOUNTER — Encounter: Payer: Self-pay | Admitting: Physical Therapy

## 2019-09-02 DIAGNOSIS — G8929 Other chronic pain: Secondary | ICD-10-CM

## 2019-09-02 DIAGNOSIS — R262 Difficulty in walking, not elsewhere classified: Secondary | ICD-10-CM

## 2019-09-02 DIAGNOSIS — M25562 Pain in left knee: Secondary | ICD-10-CM | POA: Diagnosis not present

## 2019-09-02 DIAGNOSIS — M6281 Muscle weakness (generalized): Secondary | ICD-10-CM

## 2019-09-02 NOTE — Therapy (Addendum)
[facility] [address], [place], [place], [address] Phone: [phone]   Fax:  [phone]  Physical Therapy Treatment  Patient Details  Name: Glen [name] MLYYTKP MRN: [id number] Date of Birth: 04/27/1941 Referring Provider (PT): Dibas Koirala MD   Encounter Date: 09/02/2019  PT End of Session - 09/02/19 0850    Visit Number  9    Number of Visits  13    Date for PT Re-Evaluation  09/03/19    Authorization Type  [employer]    PT Start Time  0850    PT Stop Time  0930    PT Time Calculation (min)  40 min    Activity Tolerance  Patient tolerated treatment well;Patient limited by pain    Behavior During Therapy  [facility] for tasks assessed/performed       Past Medical History:  Diagnosis Date  . Allergic rhinitis, cause unspecified   . Anxiety   . Asthma   . Cancer ([place]) 07/21/2018   colon  . COPD (chronic obstructive pulmonary disease) ([place])   . Depression   . Dyspnea    upon exertion  . GERD (gastroesophageal reflux disease)   . Hypertension   . PUD (peptic ulcer disease)    avoids aspirin  . Sleep apnea    uses C-PAP    Past Surgical History:  Procedure Laterality Date  . CATARACT EXTRACTION, BILATERAL    . COLONOSCOPY    . LAPAROSCOPIC LYSIS OF ADHESIONS N/A 07/21/2018   Procedure: Laparoscopic Lysis Of Adhesions;  Surgeon: Fanny Skates, MD;  Location: [place];  Service: General;  Laterality: N/A;  . LAPAROSCOPIC PARTIAL COLECTOMY Left 07/21/2018   Procedure: LAPARASCOPIC TAKEDOWN OF SPLENIC FLEXURE; OPEN LEFT COLETCTOMY WITH PRIMARY ANASTOMOSIS;  Surgeon: Fanny Skates, MD;  Location: [place];  Service: General;  Laterality: Left;  . NASAL SEPTUM SURGERY  1960's  . TONSILLECTOMY    . UMBILICAL HERNIA REPAIR N/A 07/21/2018   Procedure: PRIMARY REPAIR OF UMBILICAL HERNIA , ADULT;  Surgeon: Fanny Skates, MD;  Location: [facility];  Service: General;  Laterality: N/A;    There were no vitals filed for this  visit.  Subjective Assessment - 09/02/19 0852    Subjective  Overall patient doing better. He reports just fatigue in low back with ADLS, not pain. Average pain in knees is 5-6/10 with steps. No pain with sit to stand anymore.  He felt like left knee was going to give out going down the steps yesterday. It's better today.    Currently in Pain?  No/denies                       [facility] Adult PT Treatment/Exercise - 09/02/19 0001      Lumbar Exercises: Aerobic   Nustep  L2-3 x 6 min while goals assessed      Knee/Hip Exercises: Stretches   Sports administrator  Both;3 reps;30 seconds    Quad Stretch Limitations  supine with strap off EOB    Piriformis Stretch  Both;2 reps;30 seconds    Piriformis Stretch Limitations  edge of mat table      Knee/Hip Exercises: Standing   Step Down  10 reps;Both;Step Height: 6";Hand Hold: 2    Step Down Limitations  good control      Knee/Hip Exercises: Seated   Sit to [employer]  5 reps             PT Education - 09/02/19 1045    Education  Details  HEP reviewed and finalized; Ideas including postural modifications were provided to prevent back pain when playing the banjo this weekend at a festival.    Person(s) Educated  Patient    Methods  Explanation;Demonstration;Handout    Comprehension  Verbalized understanding;Returned demonstration          PT Long Term Goals - 09/02/19 0850      PT LONG TERM GOAL #1   Title  Pt will be independent in his HEP and progression.    Baseline  3-4 x/wk    Status  Partially Met      PT LONG TERM GOAL #2   Title  Pt will improve his 5 time sit to stand to </= 12 seconds.    Baseline  8 sec today.    Status  Achieved      PT LONG TERM GOAL #3   Title  pt will be albe to amb 1/2 mile with pain </= 3/10 in bilateral knees.    Baseline  able to mow with push mower without pain    Status  Achieved      PT LONG TERM GOAL #4   Title  Pt will be able to pick up objects from the floor using correct  body mechanics with knee pain </= 4/10.    Baseline  pain is intermittent in knees with kneeling    Status  Partially Met            Plan - 09/02/19 1046    Clinical Impression Statement  Patient is pleased with his current functional level and would like to be discharged. His knee pain is intermittent mainly with kneeling. He reported a feeling that his knee might give out yesterday with descending stairs. He has good eccentric quad control on stairs, but did report pain with this activity. His low back mainly just gets fatigued with activity. His quads and hip flexors remain tight and are likely contributing to intermittent knee and back pain. Patient to work on stretching these out at home for continued improvement.    Comorbidities  sleep apnea, COPD, anxiety, HTN, CA, asthma, depression, cataracts, hernia repair, colon CA (2020)    PT Frequency  2x / week    PT Duration  6 weeks    PT Treatment/Interventions  ADLs/Self Care Home Management;Cryotherapy;Ultrasound;Moist Heat;Iontophoresis 39m/ml Dexamethasone;Electrical Stimulation;Gait training;Stair training;Functional mobility training;Therapeutic activities;Therapeutic exercise;Balance training;Neuromuscular re-education;Patient/family education;Passive range of motion;Dry needling;Taping;Manual techniques    PT Next Visit Plan  see d/c summary    PT Home Exercise Plan  Access Code: [email or web address]   Consulted and Agree with Plan of Care  Patient       Patient will benefit from skilled therapeutic intervention in order to improve the following deficits and impairments:  Pain, Postural dysfunction, Decreased activity tolerance, Decreased strength, Decreased range of motion, Difficulty walking  Visit Diagnosis: Chronic pain of left knee  Chronic pain of right knee  Difficulty in walking, not elsewhere classified  Muscle weakness (generalized)     Problem List Patient Active Problem List   Diagnosis Date Noted  . Colonic mass  07/21/2018  . Obstructive sleep apnea 12/06/2016  . Lung nodules 06/11/2015  . Tobacco use disorder, mild, in sustained remission 01/01/2010  . URTICARIA 01/01/2010  . DYSPNEA 07/13/2009  . P U D 06/15/2009  . HYPERTENSION 06/08/2009  . ALLERGIC RHINITIS 06/08/2009  . COPD mixed type ([place] 06/08/2009    JMadelyn FlavorsPT 09/02/2019, 10:56 AM PHYSICAL THERAPY DISCHARGE SUMMARY  Visits  from Start of Care: 9  Current functional level related to goals / functional outcomes: See above for current PT status.     Remaining deficits: See above.     Education / Equipment: HEP Plan: Patient agrees to discharge.  Patient goals were partially met. Patient is being discharged due to being pleased with the current functional level.  ?????        Sigurd Sos, PT 10/18/19 10:10 AM  [facility] [address] [place], [place], [address] Phone: [phone]   Fax:  [phone]  Name: Glen [name] MRN: [id number] Date of Birth: 21-Mar-1941

## 2019-09-02 NOTE — Patient Instructions (Signed)
Access Code: [email or web address] URL: [email or web address] Date: 09/02/2019 Prepared by: Almyra Free Usbaldo Pannone  Exercises Seated Hamstring Stretch - 1 x daily - 7 x weekly - 1 sets - 2 reps - 20 hold Seated Table Piriformis Stretch - 1 x daily - 7 x weekly - 1 sets - 2 reps - 20 hold Seated Long Arc Quad - 2 x daily - 7 x weekly - 15 reps - 5 seconds hold Seated Hip Abduction with Resistance - 1 x daily - 7 x weekly - 10 reps - 3 sets Seated Hip Flexion - 1 x daily - 7 x weekly - 3 sets - 10 reps Sit to Stand with Hands on Knees - 3 x daily - 7 x weekly - 1 sets - 5 reps Seated Passive Cervical Retraction - 2 x daily - 7 x weekly - 1 sets - 10 reps - 5 hold Seated Cervical Retraction and Rotation - 2 x daily - 7 x weekly - 1 sets - 10 reps Seated Shoulder Rolls - 2 x daily - 7 x weekly - 1 sets - 10 reps Supine Quadriceps Stretch with Strap on Table - 2 x daily - 7 x weekly - 1 sets - 3 reps - 30-60 sec hold  Patient Education Trigger Point Dry Needling

## 2019-09-06 ENCOUNTER — Encounter: Payer: Medicare PPO | Admitting: Physical Therapy

## 2019-09-14 ENCOUNTER — Other Ambulatory Visit: Payer: Self-pay

## 2019-09-14 ENCOUNTER — Inpatient Hospital Stay: Payer: Medicare PPO | Attending: Oncology | Admitting: Oncology

## 2019-09-14 ENCOUNTER — Inpatient Hospital Stay: Payer: Medicare PPO

## 2019-09-14 ENCOUNTER — Telehealth: Payer: Self-pay | Admitting: Oncology

## 2019-09-14 VITALS — BP 118/43 | HR 74 | Temp 98.7°F | Resp 17 | Ht 65.0 in | Wt 190.6 lb

## 2019-09-14 DIAGNOSIS — G629 Polyneuropathy, unspecified: Secondary | ICD-10-CM | POA: Diagnosis not present

## 2019-09-14 DIAGNOSIS — K219 Gastro-esophageal reflux disease without esophagitis: Secondary | ICD-10-CM | POA: Diagnosis not present

## 2019-09-14 DIAGNOSIS — Z85038 Personal history of other malignant neoplasm of large intestine: Secondary | ICD-10-CM | POA: Diagnosis not present

## 2019-09-14 DIAGNOSIS — I10 Essential (primary) hypertension: Secondary | ICD-10-CM | POA: Insufficient documentation

## 2019-09-14 DIAGNOSIS — C186 Malignant neoplasm of descending colon: Secondary | ICD-10-CM

## 2019-09-14 DIAGNOSIS — E538 Deficiency of other specified B group vitamins: Secondary | ICD-10-CM | POA: Insufficient documentation

## 2019-09-14 DIAGNOSIS — J449 Chronic obstructive pulmonary disease, unspecified: Secondary | ICD-10-CM | POA: Diagnosis not present

## 2019-09-14 DIAGNOSIS — Z9049 Acquired absence of other specified parts of digestive tract: Secondary | ICD-10-CM | POA: Insufficient documentation

## 2019-09-14 LAB — CEA (IN HOUSE-CHCC): CEA (CHCC-In House): 1.89 ng/mL (ref 0.00–5.00)

## 2019-09-14 NOTE — Progress Notes (Signed)
  [facility] OFFICE PROGRESS NOTE   Diagnosis: Colon cancer  INTERVAL HISTORY:   Glen [name] returns as scheduled.  Good appetite.  He reports intentional weight loss.  Bowel habits are irregular.  No bleeding.  He is scheduled for a colonoscopy soon. He has bilateral knee pain and is followed by orthopedics.  Objective:  Vital signs in last 24 hours:  Blood pressure (!) 118/43, pulse 74, temperature 98.7 F (37.1 C), temperature source Temporal, resp. rate 17, height '5\' 5"'$  (1.651 m), weight 190 lb 9.6 oz (86.5 kg), SpO2 97 %.    Lymphatics: No cervical, supraclavicular, axillary, or inguinal nodes GI: No mass, nontender, no hepatosplenomegaly Vascular: No leg edema     Lab Results:  Lab Results  Component Value Date   WBC 5.4 07/25/2018   HGB 8.9 (L) 07/25/2018   HCT 27.7 (L) 07/25/2018   MCV 93.3 07/25/2018   PLT 245 07/25/2018   NEUTROABS 5.3 07/17/2018    CMP  Lab Results  Component Value Date   NA 141 07/25/2018   K 3.3 (L) 07/25/2018   CL 105 07/25/2018   CO2 29 07/25/2018   GLUCOSE 107 (H) 07/25/2018   BUN <5 (L) 07/25/2018   CREATININE 0.81 07/25/2018   CALCIUM 8.1 (L) 07/25/2018   PROT 6.0 (L) 07/17/2018   ALBUMIN 3.4 (L) 07/17/2018   AST 19 07/17/2018   ALT 14 07/17/2018   ALKPHOS 85 07/17/2018   BILITOT 0.6 07/17/2018   GFRNONAA >60 07/25/2018   GFRAA >60 07/25/2018    Lab Results  Component Value Date   CEA1 1.56 03/18/2019    Medications: I have reviewed the patient's current medications.   Assessment/Plan: 1. Colon cancer   Colonoscopy by Dr. Oletta Lamas 06/22/2018-polypoid completely obstructing large mass was found in the proximal sigmoid colon.  Pathology showed fragments of ulcer and inflammatory debris; no viable tissue present.    CT abdomen/pelvis 06/30/2018-severe descending and sigmoid diverticulosis.  There was a focal dilatation of the mid descending colon to approximately 8.5 x 6.2 cm.  There was no overt  exophytic mass or mesenteric lymphadenopathy.  No evidence of abdominal metastatic disease.    07/21/2018 open left colectomy with primary anastomosis, primary repair of umbilical hernia by Dr. Dalbert Batman.  Pathology showed invasive moderately differentiated adenocarcinoma with mucinous features measuring 7.5 cm.  There was circumferential involvement of the descending colon with luminal obstruction.  Carcinoma focally invaded into the pericolonic soft tissue.  Margins negative.  No lymphovascular or perineural invasion.  21 lymph nodes negative for cancer; loss of expression MLH1 and PMS2; MSI high.  MLH1 hyper methylation present 2. Upper endoscopy 06/22/2018-esophageal mucosal changes suggestive of long segment Barrett's esophagus.  A few gastric polyps.  Normal examined duodenum.  GERD.  Stomach biopsy-fundic gland polyp.  Negative for dysplasia.  Esophagus biopsy-Barrett's mucosa.  Negative for dysplasia.   3. COPD 4. Hypertension 5. B12 deficiency 6. Peripheral neuropathy     Disposition:  Glen [name] is in clinical remission from colon cancer.  We will follow up on the CEA from today.  He will return for an office visit and CEA in 6 months.  He is due for a surveillance colonoscopy.  He has a history of Barrett's esophagus.  He will follow up with Dr. Watt Climes.  Betsy Coder, MD  09/14/2019  3:05 PM

## 2019-09-14 NOTE — Telephone Encounter (Signed)
Scheduled per los. Gave avs and calendar  

## 2019-09-15 ENCOUNTER — Telehealth: Payer: Self-pay | Admitting: *Deleted

## 2019-09-15 NOTE — Telephone Encounter (Signed)
-----   Message from Ladell Pier, MD sent at 09/14/2019  3:56 PM EDT ----- Please call patient, CEA is normal, follow-up as scheduled

## 2019-09-15 NOTE — Telephone Encounter (Signed)
Notified of normal CEA. F/U as scheduled.

## 2019-09-17 DIAGNOSIS — M5136 Other intervertebral disc degeneration, lumbar region: Secondary | ICD-10-CM | POA: Diagnosis not present

## 2019-09-17 DIAGNOSIS — M5416 Radiculopathy, lumbar region: Secondary | ICD-10-CM | POA: Diagnosis not present

## 2019-10-14 ENCOUNTER — Other Ambulatory Visit: Payer: Self-pay

## 2019-10-14 MED ORDER — GABAPENTIN 100 MG PO CAPS: 100.0000 mg | ORAL_CAPSULE | Freq: Every day | ORAL | 1 refills | Status: DC

## 2019-10-14 NOTE — Telephone Encounter (Signed)
Rx(s) sent to pharmacy electronically.  

## 2019-11-16 ENCOUNTER — Other Ambulatory Visit: Payer: Self-pay

## 2019-11-16 ENCOUNTER — Telehealth: Payer: Self-pay | Admitting: Neurology

## 2019-11-16 MED ORDER — GABAPENTIN 100 MG PO CAPS: ORAL_CAPSULE | ORAL | 1 refills | Status: DC

## 2019-11-16 NOTE — Telephone Encounter (Signed)
Patient called in wanting some advice. He stated his neuropathy is getting worse and would like to know what he can do?

## 2019-11-16 NOTE — Telephone Encounter (Signed)
The only thing that will help balance is PT.  If agreeable, send order for PT to [facility] Can increase gabapentin to 100 in the AM, 100 in the afternoon and continue 600 at bed.  Please correct med list and see if they have enough med

## 2019-11-16 NOTE — Telephone Encounter (Signed)
Spoke with pt who called c/o increased neuropathy in feet. Toes are numb and has increased pain/numbness in feet. His balance is worsening. Taking gabapentin 100mg  AM/600mg  PM. PCP appt Sept 2021, at last visit advised to lose wt because of elevated BG, has lost 10 lbs. Return appt to see Dr Carles Collet Jan 2022. Inst pt that Dr Tat will be notified of increased neuropathy and that I'd call him back with further advice, he verbalized understanding.

## 2019-11-16 NOTE — Telephone Encounter (Signed)
Spoke with pt and notified of change in gabapentin dosing, per Dt Tat, to gabapentin 100mg  AM/100mg  afternoon/600mg  HS, pt verbalized understanding. He declines PT at this time, inst to call if he changes his mind.

## 2019-12-23 ENCOUNTER — Other Ambulatory Visit: Payer: Self-pay | Admitting: Internal Medicine

## 2020-01-21 DIAGNOSIS — H532 Diplopia: Secondary | ICD-10-CM | POA: Diagnosis not present

## 2020-01-24 DIAGNOSIS — F321 Major depressive disorder, single episode, moderate: Secondary | ICD-10-CM | POA: Diagnosis not present

## 2020-01-24 DIAGNOSIS — E7800 Pure hypercholesterolemia, unspecified: Secondary | ICD-10-CM | POA: Diagnosis not present

## 2020-01-24 DIAGNOSIS — Z79899 Other long term (current) drug therapy: Secondary | ICD-10-CM | POA: Diagnosis not present

## 2020-01-24 DIAGNOSIS — Z23 Encounter for immunization: Secondary | ICD-10-CM | POA: Diagnosis not present

## 2020-01-24 DIAGNOSIS — Z0001 Encounter for general adult medical examination with abnormal findings: Secondary | ICD-10-CM | POA: Diagnosis not present

## 2020-01-24 DIAGNOSIS — K219 Gastro-esophageal reflux disease without esophagitis: Secondary | ICD-10-CM | POA: Diagnosis not present

## 2020-01-24 DIAGNOSIS — J449 Chronic obstructive pulmonary disease, unspecified: Secondary | ICD-10-CM | POA: Diagnosis not present

## 2020-01-24 DIAGNOSIS — R7309 Other abnormal glucose: Secondary | ICD-10-CM | POA: Diagnosis not present

## 2020-01-24 DIAGNOSIS — I10 Essential (primary) hypertension: Secondary | ICD-10-CM | POA: Diagnosis not present

## 2020-01-24 DIAGNOSIS — F419 Anxiety disorder, unspecified: Secondary | ICD-10-CM | POA: Diagnosis not present

## 2020-01-25 ENCOUNTER — Ambulatory Visit: Payer: Medicare PPO | Admitting: Neurology

## 2020-01-25 ENCOUNTER — Ambulatory Visit (INDEPENDENT_AMBULATORY_CARE_PROVIDER_SITE_OTHER): Payer: Medicare PPO

## 2020-01-25 ENCOUNTER — Encounter: Payer: Self-pay | Admitting: Neurology

## 2020-01-25 ENCOUNTER — Other Ambulatory Visit: Payer: Self-pay

## 2020-01-25 ENCOUNTER — Telehealth: Payer: Self-pay | Admitting: Neurology

## 2020-01-25 ENCOUNTER — Encounter: Payer: Self-pay | Admitting: Internal Medicine

## 2020-01-25 ENCOUNTER — Ambulatory Visit: Payer: Medicare PPO | Admitting: Internal Medicine

## 2020-01-25 VITALS — BP 122/62 | HR 74 | Resp 18 | Ht 66.0 in | Wt 188.0 lb

## 2020-01-25 VITALS — BP 140/80 | HR 89 | Temp 97.6°F | Ht 66.0 in | Wt 189.1 lb

## 2020-01-25 DIAGNOSIS — G4733 Obstructive sleep apnea (adult) (pediatric): Secondary | ICD-10-CM | POA: Diagnosis not present

## 2020-01-25 DIAGNOSIS — J449 Chronic obstructive pulmonary disease, unspecified: Secondary | ICD-10-CM

## 2020-01-25 DIAGNOSIS — I6782 Cerebral ischemia: Secondary | ICD-10-CM

## 2020-01-25 DIAGNOSIS — H532 Diplopia: Secondary | ICD-10-CM | POA: Diagnosis not present

## 2020-01-25 DIAGNOSIS — I6521 Occlusion and stenosis of right carotid artery: Secondary | ICD-10-CM | POA: Diagnosis not present

## 2020-01-25 DIAGNOSIS — G250 Essential tremor: Secondary | ICD-10-CM | POA: Diagnosis not present

## 2020-01-25 DIAGNOSIS — R918 Other nonspecific abnormal finding of lung field: Secondary | ICD-10-CM

## 2020-01-25 IMAGING — DX DG CHEST 2V
3 series · 3 of 3 positions shown · non-contrast
Comparison: Chest x-ray [DATE], CT chest [DATE]

CLINICAL DATA: COPD

EXAM:
CHEST - 2 VIEW

[chest pa (1 of 2)]
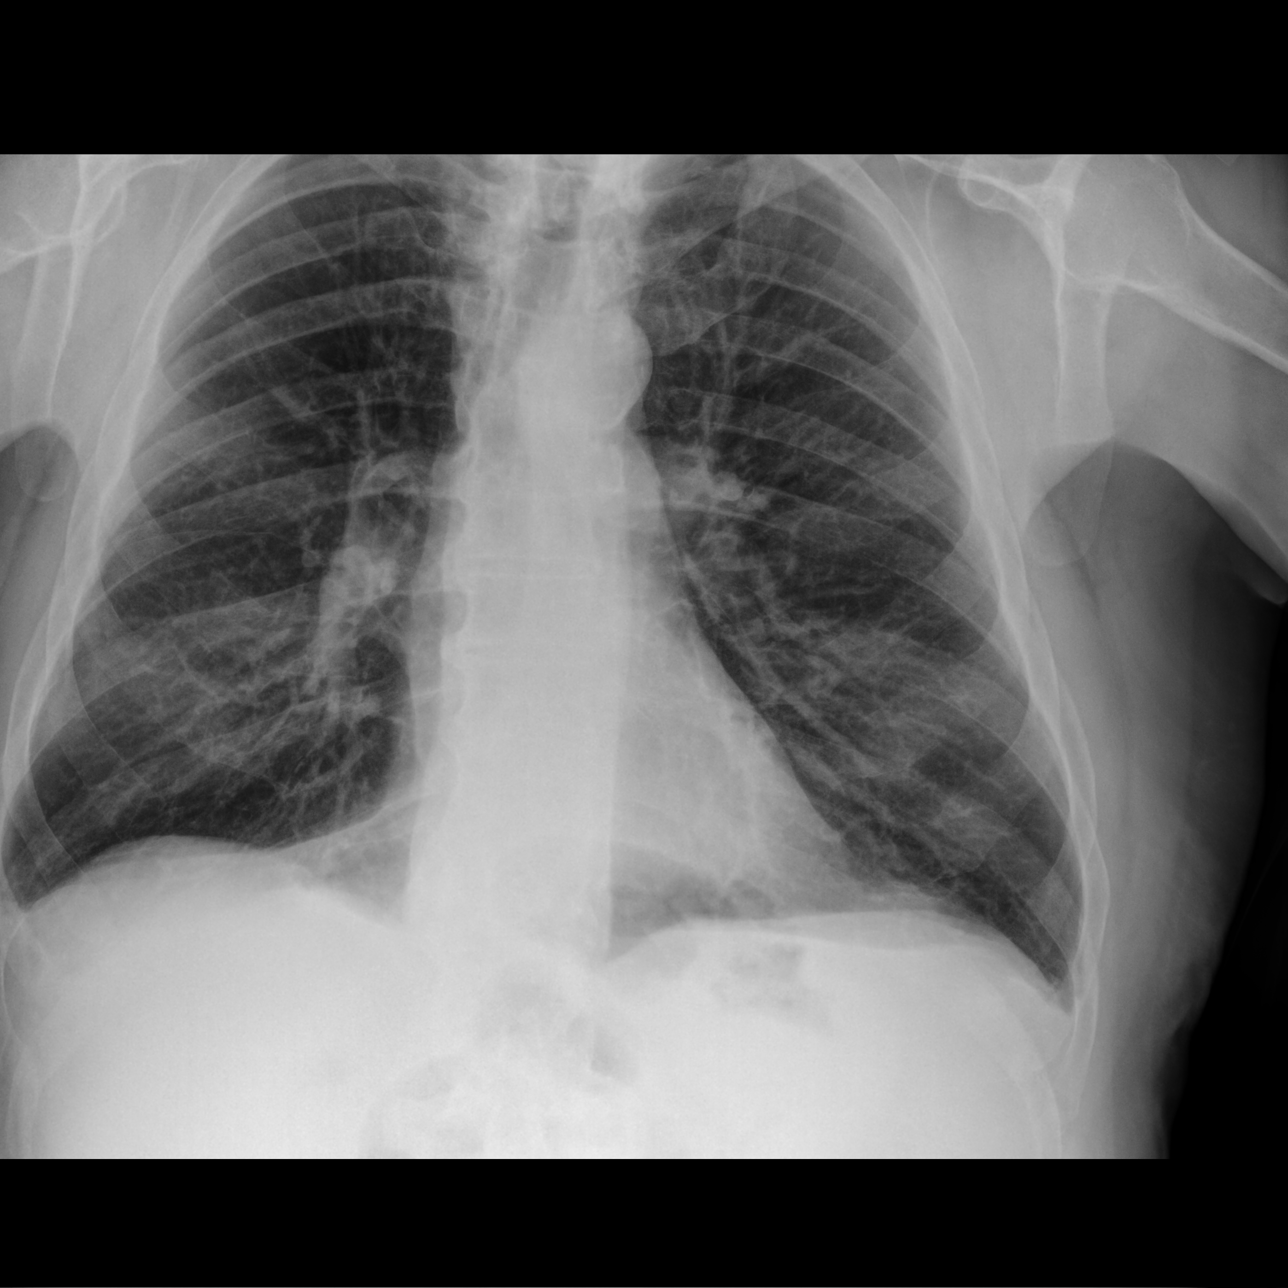

[chest lat]
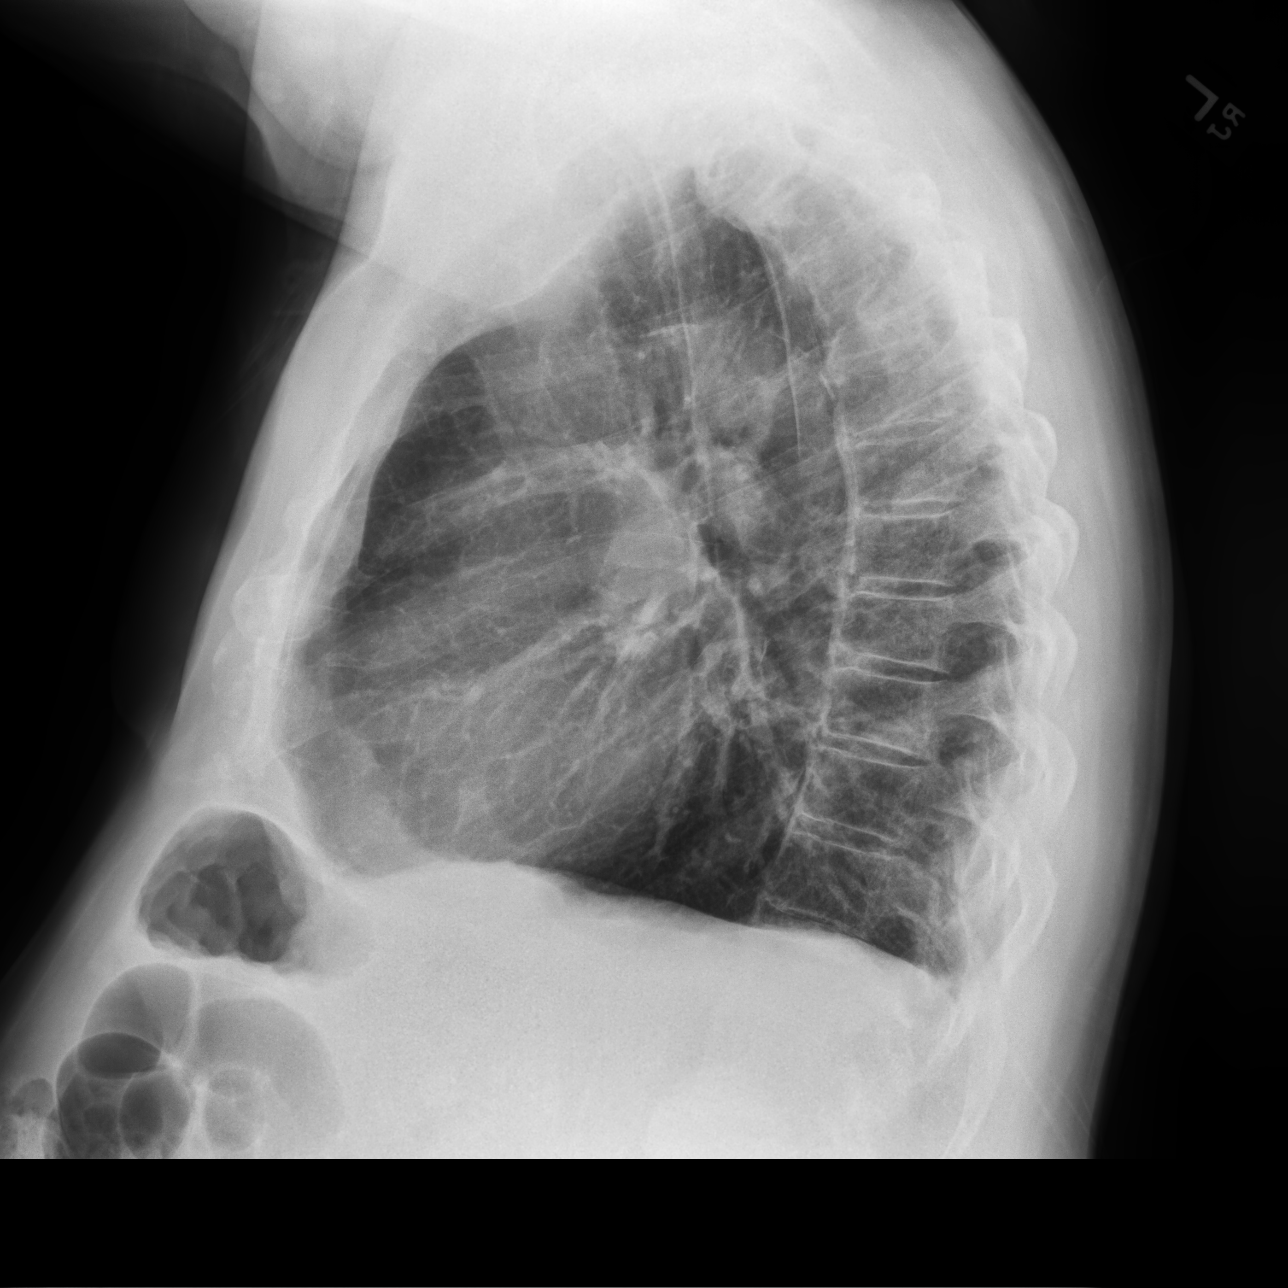

[chest pa (2 of 2)]
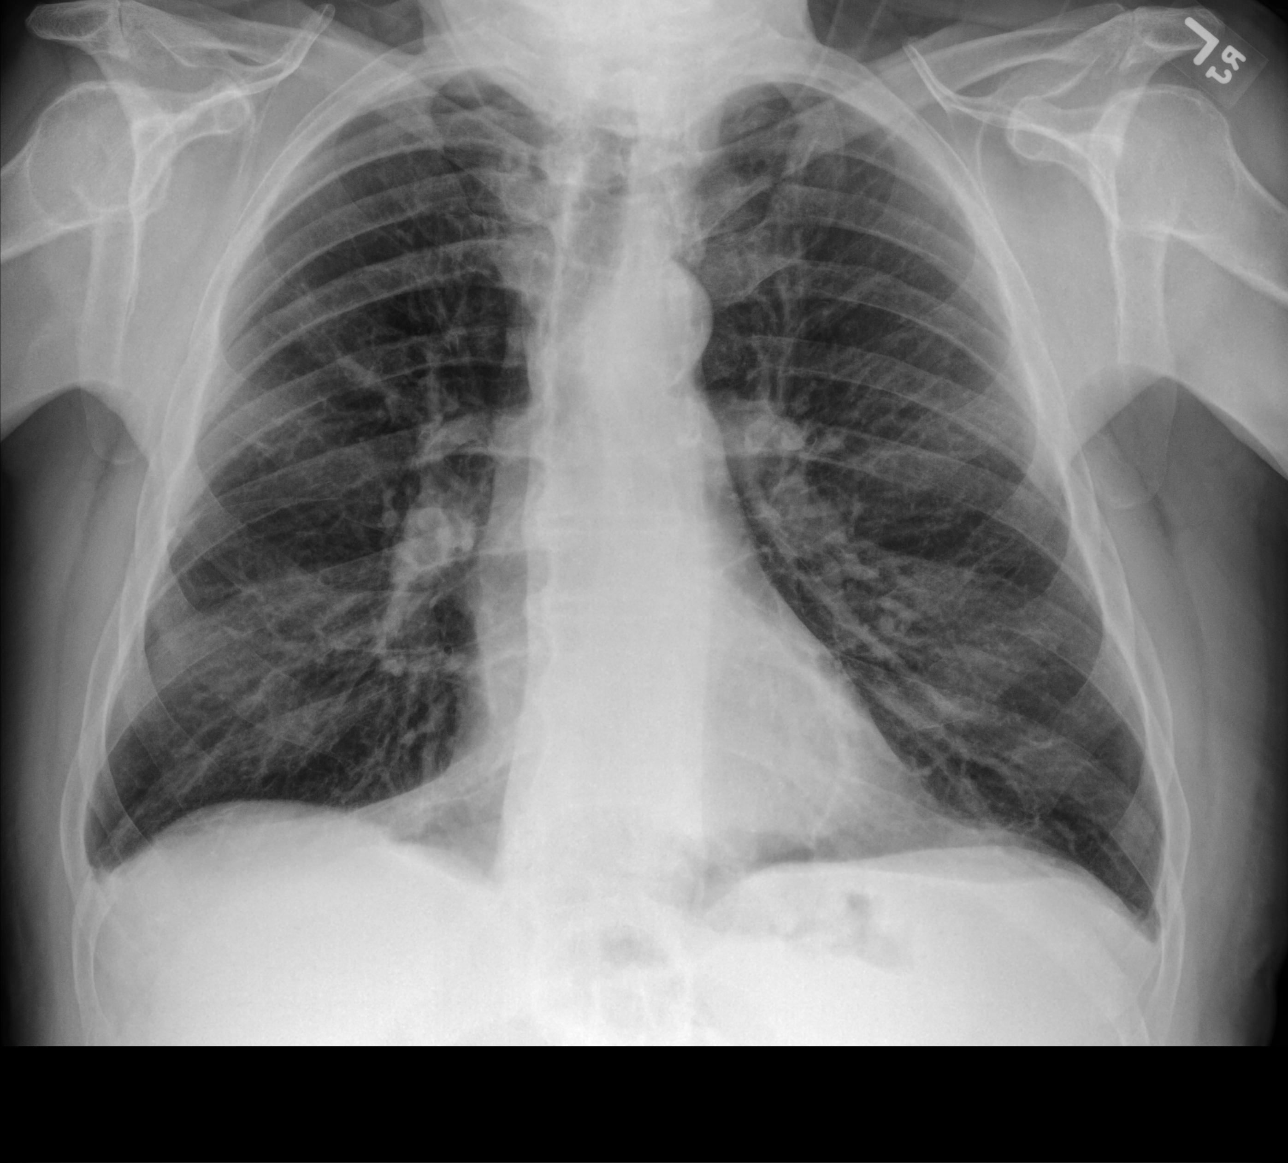

[3 of 3 positions shown; findings below may reference images not displayed]

FINDINGS: The heart size and mediastinal contours are within normal limits.
Aortic arch and descending thoracic aorta calcification. Flattening
of bilateral hemidiaphragms consistent with known emphysema.

No focal consolidation. No pulmonary edema. Nonspecific blunting of
bilateral costophrenic angles with no definite pleural effusion. No
pneumothorax.

No acute osseous abnormality. Multilevel degenerative change of the
spine with contiguous osteophyte formation.
IMPRESSION: No active cardiopulmonary disease.

## 2020-01-25 MED ORDER — PRIMIDONE 50 MG PO TABS: 50.0000 mg | ORAL_TABLET | Freq: Two times a day (BID) | ORAL | 1 refills | Status: DC

## 2020-01-25 NOTE — Patient Instructions (Addendum)
1.  A referral to [facility] has been placed for your MRI brain/MRA head and neck someone will contact you directly to schedule your appt. They are located at [place]. Please contact them directly by calling [phone] with any questions regarding your referral.  2.  Increase primidone - 50 mg - 1 tablet twice per day  The physicians and staff at [facility] are committed to providing excellent care. You may receive a survey requesting feedback about your experience at our office. We strive to receive "very good" responses to the survey questions. If you feel that your experience would prevent you from giving the office a "very good " response, please contact our office to try to remedy the situation. We may be reached at [phone]. Thank you for taking the time out of your busy day to complete the survey.

## 2020-01-25 NOTE — Progress Notes (Signed)
Subjective:    Patient ID: Glen [name], male    DOB: 1940-05-25, 79 y.o.   MRN: [id number]  HPI male former smoker followed for COPD,OSA,  rhinitis, history urticaria, complicated by HBP LNL-89/21/1941-DEYCXK obstructive airways disease with response to bronchodilator, air trapping, normal diffusion. FEV1/FVC 0.52, TLC 93%, DLCO 87 Walk Test 06/07/2016-Baseline 94% desaturated only to 92% on room air CT chest 12/22/2015-1.2 cm right upper lobe groundglass pulmonary nodule-----Resolved CT 06/15/16 ONOX room air 07/02/16- recorded 3 minutes with saturation less than or equal to 88%. HST 12/17/2016-AHI 7.9/hour, desaturation to 81%, body weight 210 pounds Walk Test 09/08/2017-3 laps/ 555 feet, lowest saturation 90%. ------------------------------------------------------------------   04/27/2018 - 79 year old male former smoker followed for COPD, lung nodule,OSA,  rhinitis, history urticaria, complicated by HBP CPAP auto 5-20/ [employer] -----OSA: DME [employer]. Pt wears CPAP nightly.  Neb DuoNeb, pro-air HFA, Trelegy, Download 100% compliance AHI 1.4/hour.  He put CPAP on even for naps.  He and wife use separate bedrooms but he is not aware of snoring through or movement disturbance. No recent respiratory exacerbation.  Dyspnea does interfere with his banjo playing trips.  Nebulizer treatments with albuterol make his tremor worse so we discussed trying Xopenex.  01/25/20- 79 year old male former smoker followed for COPD, lung nodule,OSA,  Rhinitis, history urticaria, complicated by HBP, Colon cancer resected.  CPAP auto 5-20/ [facility] Download compliance 87%, AHI 1.4/ hr Body weight today 189 lbs Covid vax- 2 Phizer Flu vax- already done Trelegy 100, Proair hfa, neb xop -----yearly follow up--doing good with the cpap. discuss battery operated cpap machine to use when camping  He plays banjo in musical gatherings, often w/o electricity source over night. Feels much better since colon cancer  treated.  CXR 09/15/18-  IMPRESSION: No active cardiopulmonary disease. Hyperinflation with emphysematous disease.  ROS-see HPI  + = positive Constitutional:   No-   weight loss, night sweats, fevers, chills, +fatigue, lassitude. HEENT:   No-  headaches, difficulty swallowing, tooth/dental problems, sore throat,       No-  sneezing, itching, ear ache, nasal congestion, post nasal drip,  CV:  No-   chest pain, orthopnea, PND, swelling in lower extremities, anasarca, dizziness, palpitations Resp: +  shortness of breath with exertion or at rest.         + productive cough,  + non-productive cough,  No- coughing up of blood.              No-   change in color of mucus.  No- wheezing.   Skin: No-   rash or lesions. GI:  No-   heartburn, indigestion, abdominal pain, nausea, vomiting,  GU: . MS:  No-   joint pain or swelling.  . Neuro-     nothing unusual Psych:  No- change in mood or affect. + depression or anxiety.  No memory loss.  OBJ- Physical Exam General- Alert, Oriented, Affect-appropriate, Distress- none acute, + obese Skin- rash-none, lesions- none, excoriation- none Lymphadenopathy- none Head- atraumatic            Eyes- Gross vision intact, PERRLA, conjunctivae and secretions clear            Ears- Hearing, canals-normal            Nose- Clear, no-Septal dev, mucus, polyps, erosion, perforation             Throat- Mallampati II , mucosa clear , drainage- none, tonsils- atrophic Neck- flexible , trachea midline, no stridor , thyroid nl, carotid no bruit  Chest - symmetrical excursion , unlabored           Heart/CV- RRR , no murmur , no gallop  , no rub, nl s1 s2                           - JVD- none , edema- none, stasis changes- none, varices- none           Lung- +quiet, distant, wheeze- none, cough- none , dullness-none, rub- none           Chest wall-  Abd-  Br/ Gen/ Rectal- Not done, not indicated Extrem- cyanosis- none, clubbing, none, atrophy- none, strength- nl Neuro-  grossly intact to observation Assessment & Plan:

## 2020-01-25 NOTE — Progress Notes (Addendum)
Assessment/Plan:    1.  Diplopia, resolving.  -Ophthalmology felt vascular in nature.  Patient does have history of carotid stenosis with 60 to 79% stenosis on the right when last checked.  Patient on aspirin, 81 mg daily.  -Patient on Lipitor, 40 mg daily.  Was going to check fasting lipids but had them done yesterday at pcp.  We will try to get a copy of that.  Discussed patient that goal LDL is less than 70.  (Addendum: Patient's LDL on January 24, 2020 was 31)  -We will check MRI/MRA head and neck.  -Discussed with patient that should he have any new or focal neurologic signs and symptoms he should go to the emergency room and not wait.  Patient expressed understanding.  2.  ET  -pt would like to increase to 50 mg bid.  Will send new RX.  R/B/SE were discussed.  The opportunity to ask questions was given and they were answered to the best of my ability.  The patient expressed understanding and willingness to follow the outlined treatment protocols.  3.  PN  -on gabapentin, 100mg  in the AM and 600 mg at night  Subjective:   Glen [name] was seen today in follow up for same-day work in.  I had received a standard referral on the patient for diplopia from ophthalmology.  He apparently saw ophthalmology on September 10 for diplopia.  The referral landed on my desk today, and I thought that the patient needed to be urgently worked in as ophthalmology notes indicated that they thought it was a vascular event (no one contacted me however).  Patient's symptoms started on September 5 [place].  Pt states that it was vertical diplopia. Pt states that prisms were put in the glasses and it helped.  Without the glasses, it is still "blurry" but not as bad as it was.  No lateralizing weakness/paresthesias.  If he bends over to pet dog, he will be lightheaded.   Patient does have a history of right carotid stenosis.  He follows with vascular surgery.  He last  saw vascular surgery on August 11, 2019 with a stable 60 to 79% stenosis on the right.   ALLERGIES:   Allergies  Allergen Reactions  . Aspirin Other (See Comments)    pt has bleeding ulcers  . Lisinopril Cough    CURRENT MEDICATIONS:  Outpatient Encounter Medications as of 01/25/2020  Medication Sig  . acetaminophen (TYLENOL) 650 MG CR tablet Take 1,950 mg by mouth at bedtime.  . ALPRAZolam (XANAX) 0.25 MG tablet Take 0.25 mg by mouth daily as needed for anxiety.   [name] amLODipine (NORVASC) 10 MG tablet Take 10 mg by mouth every evening.   [name] aspirin EC 81 MG tablet Take 81 mg by mouth at bedtime.   [name] atorvastatin (LIPITOR) 40 MG tablet Take 40 mg by mouth at bedtime.   . diphenhydrAMINE (BENADRYL) 25 MG tablet Take 75 mg by mouth at bedtime.   . gabapentin (NEURONTIN) 100 MG capsule TAKE 1 CAPSULE BY MOUTH IN AM AND 1 CAPSULE BY MOUTH IN AFTERNOON (Patient taking differently: TAKE 1 CAPSULE BY MOUTH IN am)  . gabapentin (NEURONTIN) 300 MG capsule Take 2 capsules (600 mg total) by mouth at bedtime.  . hydrochlorothiazide (MICROZIDE) 12.5 MG capsule Take 12.5 mg by mouth daily.   [name] levalbuterol (XOPENEX) 0.63 MG/3ML nebulizer solution USE 1 VIAL VIA NEBULIZER EVERY 4 HOURS AS NEEDED FOR WHEEZING OR  SHORTNESS OF BREATH  . omeprazole (PRILOSEC) 20 MG capsule Take 20 mg by mouth daily.    . primidone (MYSOLINE) 50 MG tablet Take 1 tablet (50 mg total) by mouth at bedtime.  [name] PROAIR HFA 108 (90 Base) MCG/ACT inhaler INHALE 2 PUFFS INTO THE LUNGS EVERY 4 HOURS AS NEEDED FOR WHEEZING OR SHORTNESS OF BREATH.  . sertraline (ZOLOFT) 100 MG tablet Take 100 mg by mouth daily.   [name] telmisartan (MICARDIS) 40 MG tablet Take 40 mg by mouth daily.  . TRELEGY ELLIPTA 100-62.5-25 MCG/INH AEPB INHALE 1 PUFF INTO THE LUNGS DAILY  . vitamin B-12 (CYANOCOBALAMIN) 1000 MCG tablet Take 1,000 mcg by mouth daily.   No facility-administered encounter medications on file as of 01/25/2020.     Objective:     PHYSICAL EXAMINATION:    VITALS:   Vitals:   01/25/20 1423  BP: 122/62  Pulse: 74  Resp: 18  SpO2: 95%  Weight: 188 lb (85.3 kg)  Height: 5\' 6"  (1.676 m)    GEN:  The patient appears stated age and is in NAD. HEENT:  Normocephalic, atraumatic.  The mucous membranes are moist. The superficial temporal arteries are without ropiness or tenderness. CV:  RRR Lungs:  CTAB Neck/HEME:  There are no carotid bruits bilaterally.  Neurological examination:  Orientation: The patient is alert and oriented x3. Cranial nerves: There is good facial symmetry.  Extraocular muscles are intact today.  Was not able to elicit any extraocular muscle palsy.  The speech is fluent and clear. Soft palate rises symmetrically and there is no tongue deviation. Hearing is intact to conversational tone. Sensation: Sensation is intact to light touch throughout.  There is no extinction with double simultaneous stimulation. Motor: Strength is 5/5 in bilateral upper and lower extremities.  There is no pronator drift.  Movement examination: Tone: There is normal tone in the UE/LE Abnormal movements: There is postural tremor.  There is intention tremor.  There is no rest tremor. Coordination:  There is no decremation with RAM's Gait and Station: The patient has no difficulty arising out of a deep-seated chair without the use of the hands. The patient's stride length is good I have reviewed and interpreted the following labs independently   Chemistry      Component Value Date/Time   NA 141 07/25/2018 0330   K 3.3 (L) 07/25/2018 0330   CL 105 07/25/2018 0330   CO2 29 07/25/2018 0330   BUN <5 (L) 07/25/2018 0330   CREATININE 0.81 07/25/2018 0330      Component Value Date/Time   CALCIUM 8.1 (L) 07/25/2018 0330   ALKPHOS 85 07/17/2018 0929   AST 19 07/17/2018 0929   ALT 14 07/17/2018 0929   BILITOT 0.6 07/17/2018 0929      Lab Results  Component Value Date   WBC 5.4 07/25/2018   HGB 8.9 (L)  07/25/2018   HCT 27.7 (L) 07/25/2018   MCV 93.3 07/25/2018   PLT 245 07/25/2018   Lab Results  Component Value Date   TSH 1.48 11/20/2015     Chemistry      Component Value Date/Time   NA 141 07/25/2018 0330   K 3.3 (L) 07/25/2018 0330   CL 105 07/25/2018 0330   CO2 29 07/25/2018 0330   BUN <5 (L) 07/25/2018 0330   CREATININE 0.81 07/25/2018 0330      Component Value Date/Time   CALCIUM 8.1 (L) 07/25/2018 0330   ALKPHOS 85 07/17/2018 0929   AST 19 07/17/2018 0929  ALT 14 07/17/2018 0929   BILITOT 0.6 07/17/2018 5277     Addendum: Patient had lab work received from primary care.  Lab work dated January 24, 2020.  Sodium is 138, potassium 4.2, chloride 103, CO2 30, BUN 27, creatinine 1.14, glucose 114, AST 16, ALT 14, hemoglobin A1c 6.3, total cholesterol 106, triglycerides 94, HDL 33, calculated LDL, 54.    Total time spent on today's visit was 40 minutes, including both face-to-face time and nonface-to-face time.  Time included that spent on review of records (prior notes available to me/labs/imaging if pertinent), discussing treatment and goals, answering patient's questions and coordinating care.  Cc:  [name], MD

## 2020-01-25 NOTE — Telephone Encounter (Signed)
Received standard referral on the patient (no one called).  Referral received from [facility] ophthalmology.  He was seen there on September 10.  The prior Sunday, the patient apparently developed double vision while driving.  The note is handwritten and somewhat difficult to read, but it does state that it is felt that this was probably a vascular event and that it is improving.  The referral was faxed to our office yesterday and received by me today.  Judson Roch or Hinton Dyer, will you call the patient and see if he can do a video visit or in person visit today at 230.  I will be out of the office on cme at the end of the week.  It looks like the patient has an appointment in pulmonary currently, so may not be able to answer the phone right now, but perhaps call and leave a message for him.

## 2020-01-25 NOTE — Patient Instructions (Signed)
Order- CXR-  Dx COPD mixed type   We can continue CPAP auto 5-20  Suggest you ask [employer] for suggestions about battery power for you CPAP machine, and what they can tell you about portable travel CPAP machines that have batteries, like Transcend or Air-Mini.  Consider checking on-line at [employer] for battery power for CPAP machines, useful for travel and camping.  We can continue current inhalers. Glad the Trelegy has helped you.   We will document yor flu shot for our record.  Please call if we can help

## 2020-01-27 ENCOUNTER — Telehealth: Payer: Self-pay | Admitting: Internal Medicine

## 2020-01-27 NOTE — Telephone Encounter (Signed)
Spoke with patient regarding CXR- stable with no new problem.Patient's voice was understanding . Nothing else further needed.

## 2020-02-05 ENCOUNTER — Other Ambulatory Visit: Payer: Self-pay | Admitting: Neurology

## 2020-02-07 NOTE — Assessment & Plan Note (Signed)
Benefits from CPAP with good compliance and control Plan- he can ask [employer] and check on line for battery/ portable power sources and travel CPAP. Continue auto 5-15

## 2020-02-07 NOTE — Assessment & Plan Note (Signed)
Has been stable without exacerbation Plan- continue current meds, CXR

## 2020-02-07 NOTE — Assessment & Plan Note (Signed)
Plan- update CXR

## 2020-02-16 ENCOUNTER — Other Ambulatory Visit: Payer: Self-pay

## 2020-02-16 ENCOUNTER — Ambulatory Visit
Admission: RE | Admit: 2020-02-16 | Discharge: 2020-02-16 | Disposition: A | Discharge status: Home / Self Care | Payer: Medicare PPO | Source: Ambulatory Visit | Attending: Neurology | Admitting: Neurology

## 2020-02-16 DIAGNOSIS — I6602 Occlusion and stenosis of left middle cerebral artery: Secondary | ICD-10-CM | POA: Diagnosis not present

## 2020-02-16 DIAGNOSIS — I6521 Occlusion and stenosis of right carotid artery: Secondary | ICD-10-CM

## 2020-02-16 DIAGNOSIS — H532 Diplopia: Secondary | ICD-10-CM

## 2020-02-16 DIAGNOSIS — I6782 Cerebral ischemia: Secondary | ICD-10-CM

## 2020-02-16 IMAGING — MR MR MRA NECK W/O CM
1 series · 18 of 48 positions shown · non-contrast
Comparison: CT angiography of the neck [DATE]

CLINICAL DATA: Blurred vision, intermittent since [REDACTED].



[Series 4: fl_[id number]_2d · axial · 3.0mm · 0.39mm/px · z∈[-188,-98]mm · 18 of 50 slices shown]
[im 1/50]
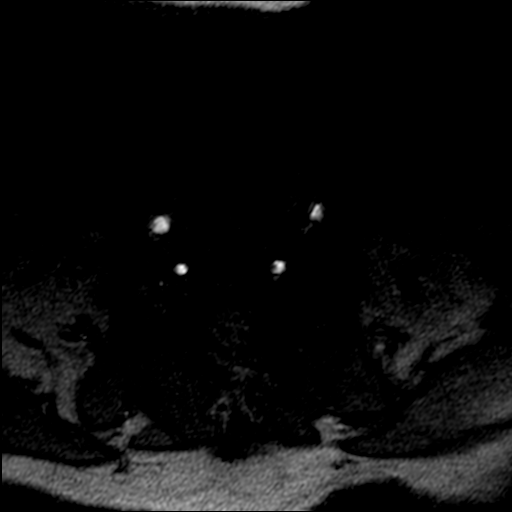
[im 2/50]
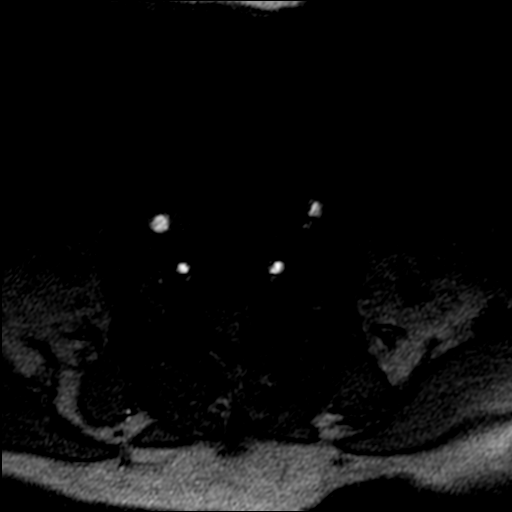
[im 3/50]
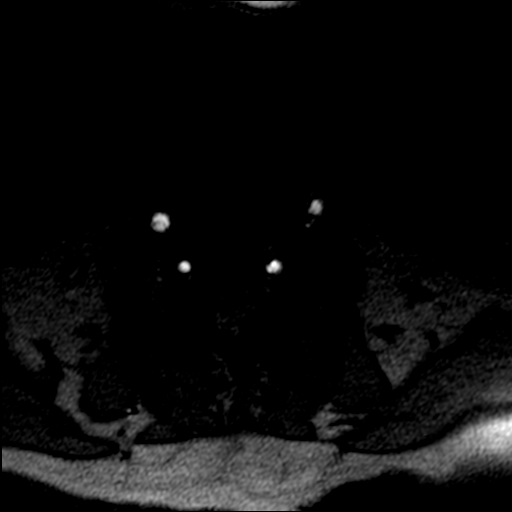
[im 4/50]
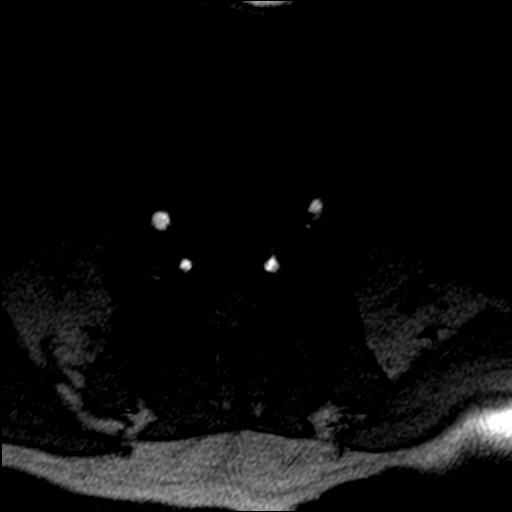
[im 5/50]
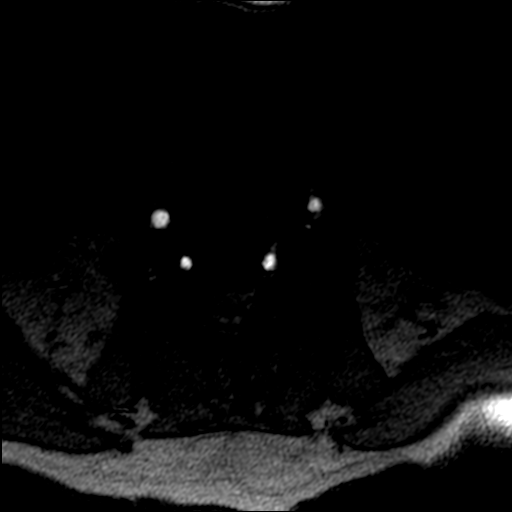
[im 6/50]
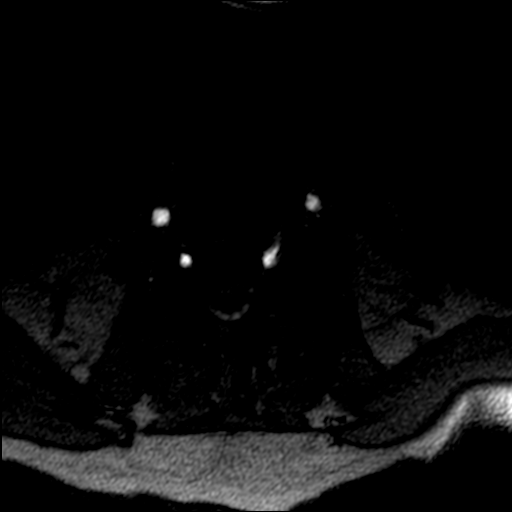
[im 7/50]
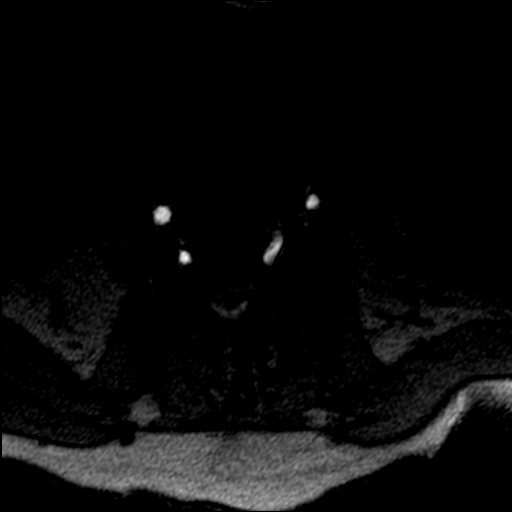
[im 8/50]
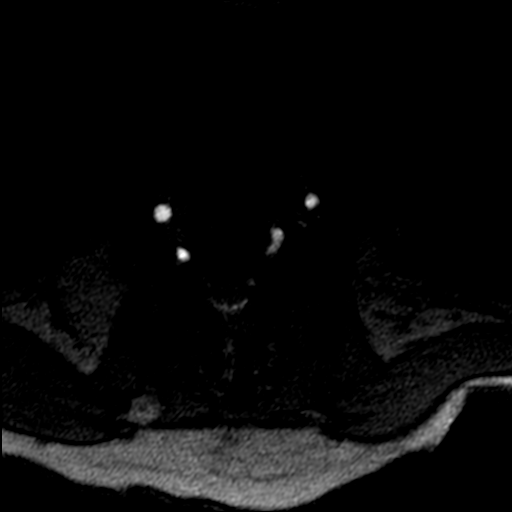
[im 9/50]
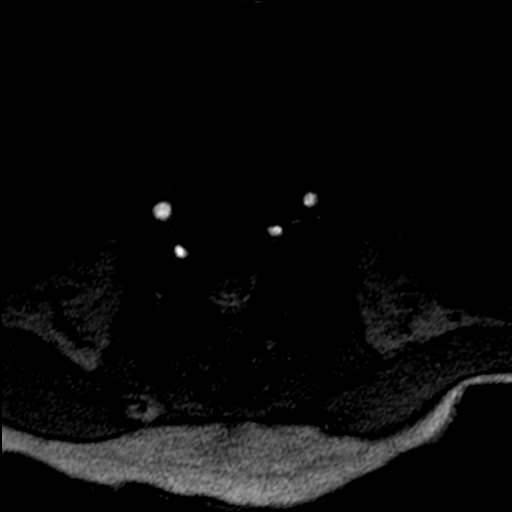
[im 10/50]
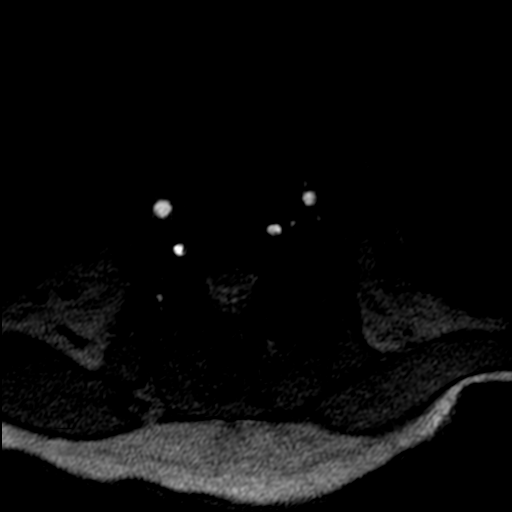
[im 16/50]
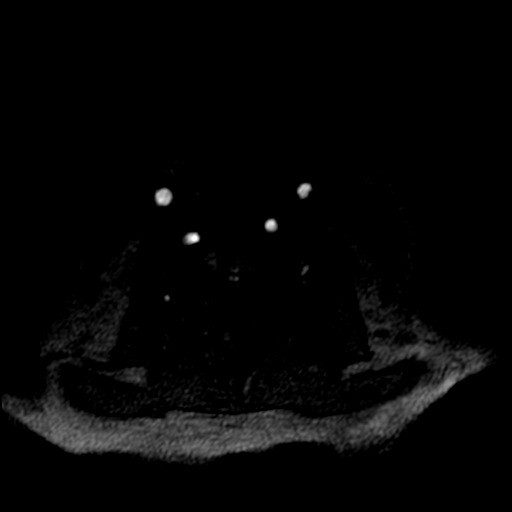
[im 22/50]
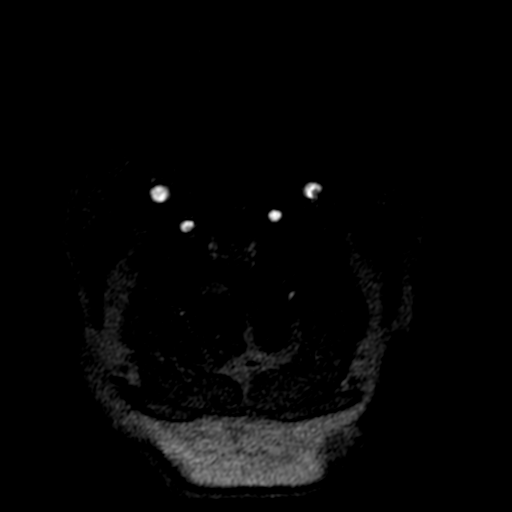
[im 26/50]
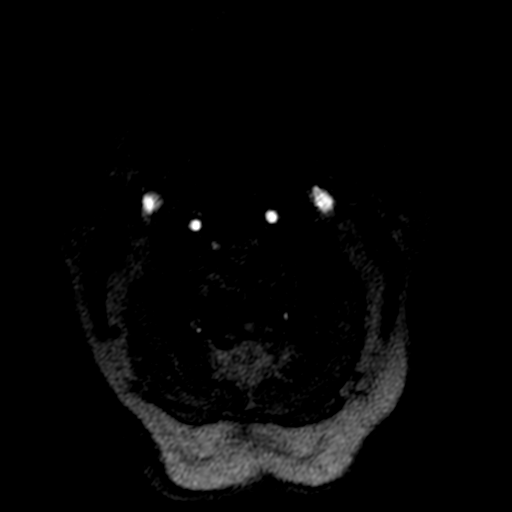
[im 29/50]
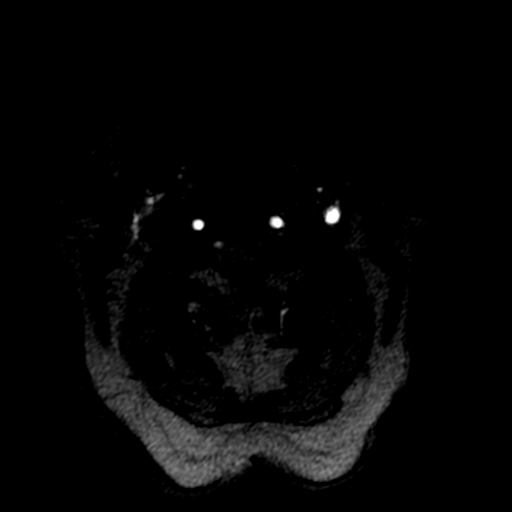
[im 35/50]
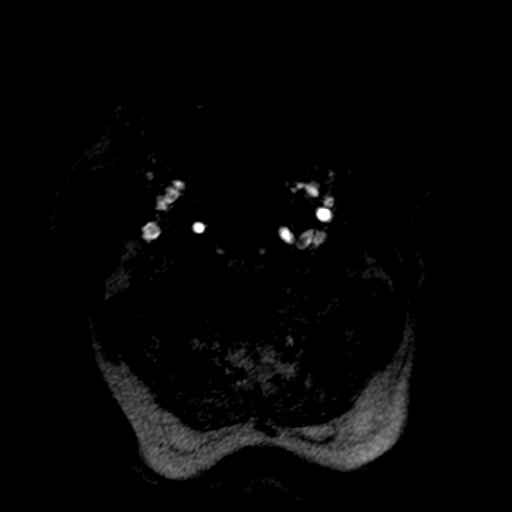
[im 41/50]
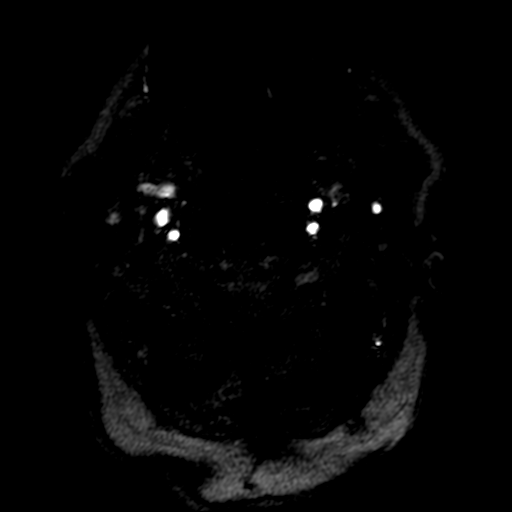
[im 42/50]
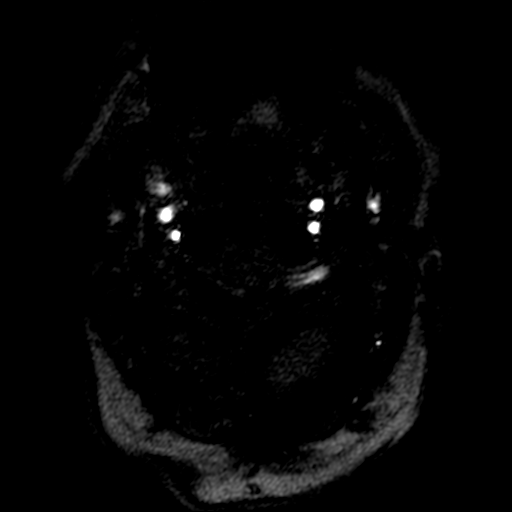
[im 47/50]
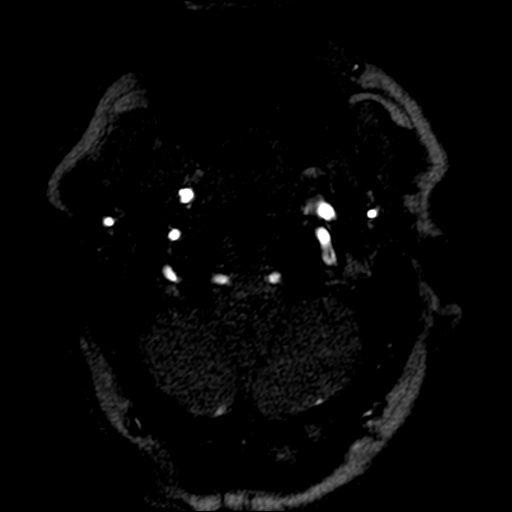

[18 of 48 positions shown; findings below may reference images not displayed]

FINDINGS: MRI HEAD FINDINGS

Brain: Diffusion imaging does not show any acute or subacute
infarction. Mild chronic small-vessel ischemic change affects the
pons. No focal cerebellar insult. Cerebral hemispheres show moderate
chronic small-vessel ischemic changes of the white matter. No
cortical or large vessel territory infarction. No mass lesion,
hemorrhage, hydrocephalus or extra-axial collection.

Vascular: Major vessels at the base of the brain show flow.

Skull and upper cervical spine: Negative

Sinuses/Orbits: Small amount of fluid in the right maxillary sinus.
Other sinuses clear. Orbits negative.

Other: None

MRA HEAD FINDINGS

Both internal carotid arteries patent through the skull base and
siphon regions. The anterior and middle cerebral vessels are patent.
Moderate stenosis of the right middle cerebral artery.

Both vertebral arteries widely patent to the basilar. No basilar
stenosis. Posterior circulation branch vessels are normal.

MRA NECK FINDINGS

Antegrade flow in both vertebral arteries. No abnormality in the
region studied.

Both common carotid arteries are patent to the bifurcation regions.
The study suffers from motion degradation. On the left, there is no
visible stenosis at the bifurcation. On the right, there [DATE]%
stenosis of the proximal ICA. Detail is poor because of motion
noncontrast technique.
IMPRESSION: MRI head: No acute finding. Moderate chronic small-vessel ischemic
changes of the pons and cerebral hemispheric white matter.

MRA head: No large or medium vessel occlusion. Moderate stenosis of
the right middle cerebral artery.

MRA neck: Limited by motion and noncontrast technique. Antegrade
flow in both vertebral arteries. No ICA stenosis on the left.
Question 50% stenosis of the ICA on right.

## 2020-02-16 IMAGING — MR MR HEAD W/O CM
10 series · 48 of 48 positions shown · non-contrast
Comparison: CT angiography of the neck [DATE]

CLINICAL DATA: Blurred vision, intermittent since [REDACTED].



[Series 1: T1 · sagittal · 5.0mm · 0.45mm/px · 2 of 21 slices shown]
[im 1/21]
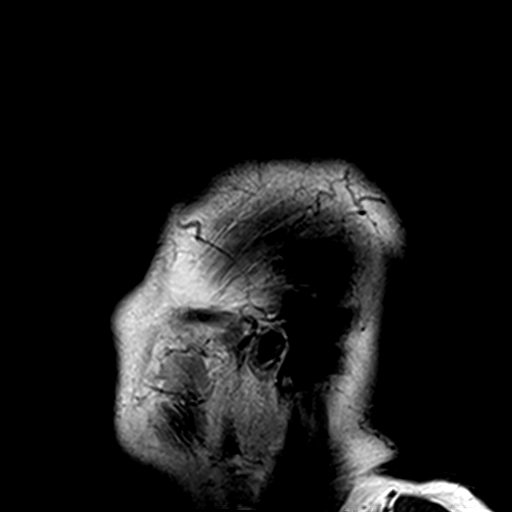
[im 21/21]
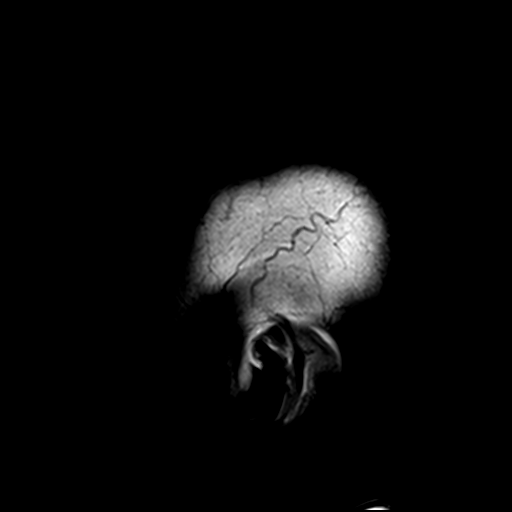

[Series 2: DWI · axial · 3.0mm · 1.80mm/px · z∈[-47,+95]mm · 9 of 95 slices shown (1 of 4)]
[im 1/95]
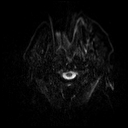
[im 12/95]
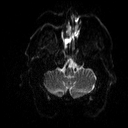
[im 24/95]
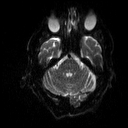
[im 36/95]
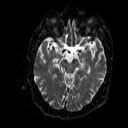
[im 48/95]
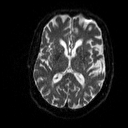
[im 59/95]
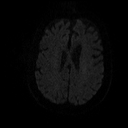
[im 71/95]
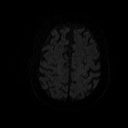
[im 83/95]
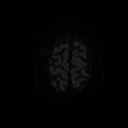
[im 95/95]
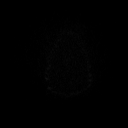

[Series 3: DWI · axial · 3.0mm · 1.80mm/px · z∈[-47,+95]mm · 5 of 50 slices shown (2 of 4)]
[im 1/50]
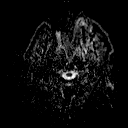
[im 13/50]
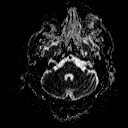
[im 25/50]
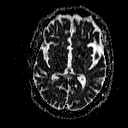
[im 37/50]
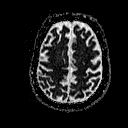
[im 50/50]
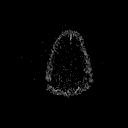

[Series 4: DWI · coronal · 5.0mm · 1.80mm/px · 6 of 68 slices shown (3 of 4)]
[im 1/68]
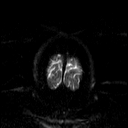
[im 14/68]
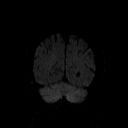
[im 27/68]
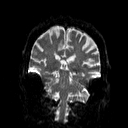
[im 41/68]
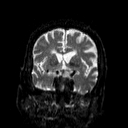
[im 54/68]
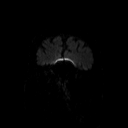
[im 68/68]
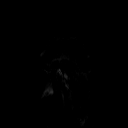

[Series 5: DWI · coronal · 5.0mm · 1.80mm/px · 3 of 34 slices shown (4 of 4)]
[im 1/34]
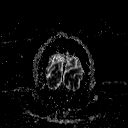
[im 17/34]
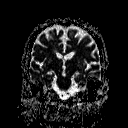
[im 34/34]
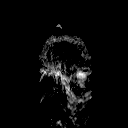

[Series 6: T2 · axial · 5.0mm · 0.51mm/px · z∈[-52,+91]mm · 2 of 22 slices shown (1 of 2)]
[im 1/22]
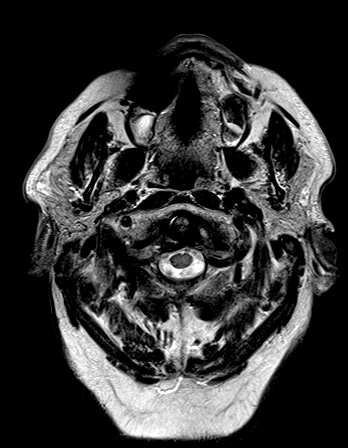
[im 22/22]
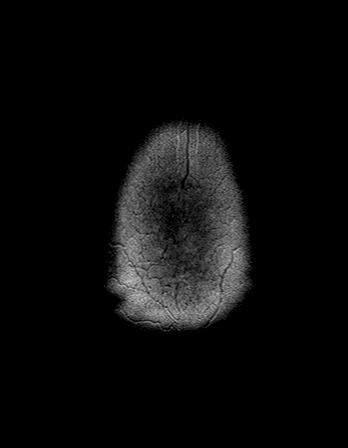

[Series 7: FLAIR · axial · 3.0mm · 0.45mm/px · z∈[-47,+86]mm · 3 of 30 slices shown]
[im 1/30]
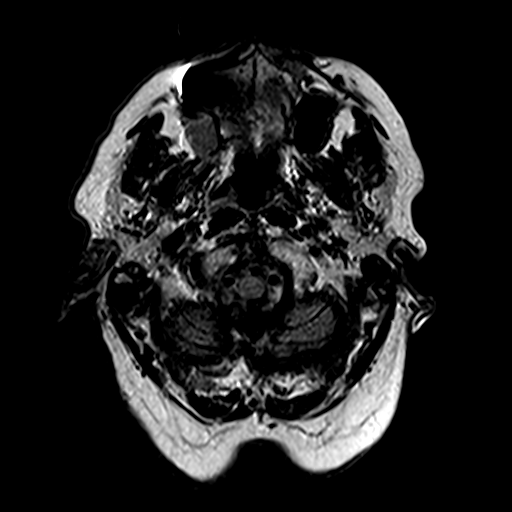
[im 15/30]
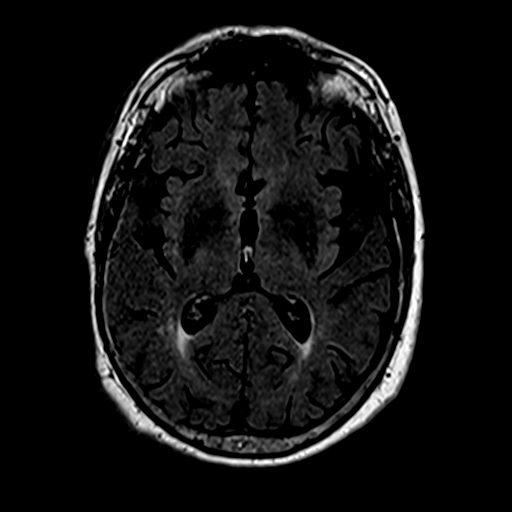
[im 30/30]
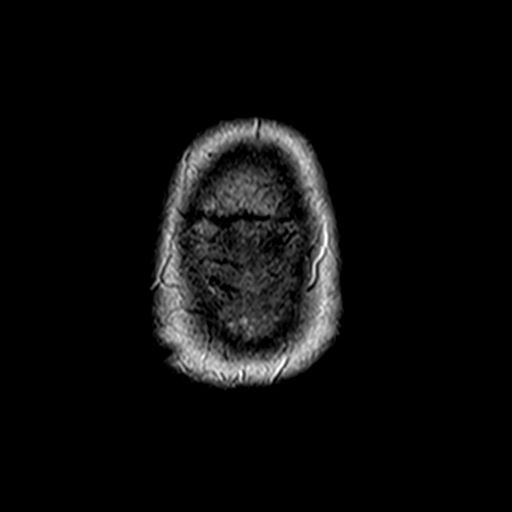

[Series 9: swi_images · axial · 4.0mm · 0.90mm/px · z∈[-58,+79]mm · 3 of 36 slices shown]
[im 1/36]
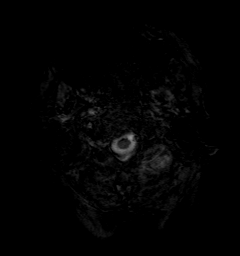
[im 18/36]
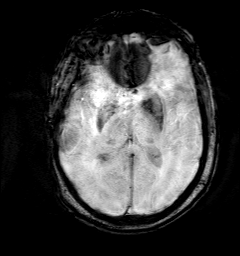
[im 36/36]
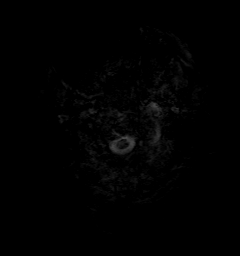

[Series 10: t1_mpr_tra · axial · 1.0mm · 0.71mm/px · z∈[-48,+90]mm · 13 of 144 slices shown]
[im 1/144]
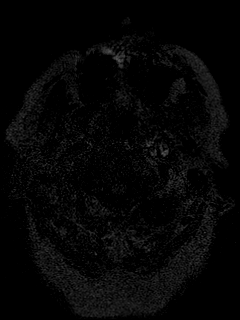
[im 12/144]
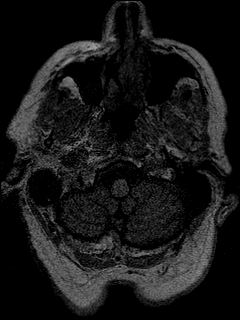
[im 24/144]
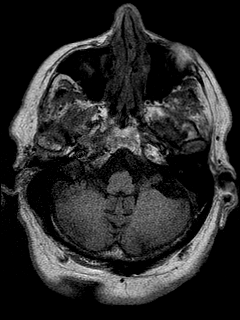
[im 36/144]
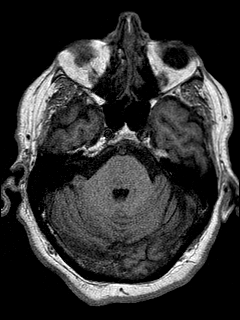
[im 48/144]
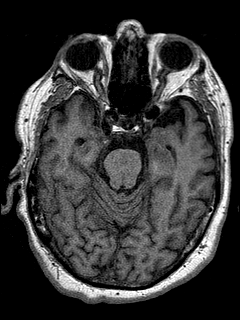
[im 60/144]
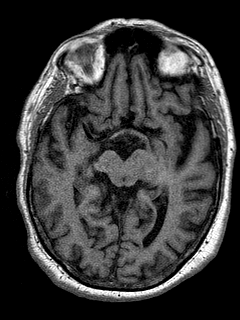
[im 72/144]
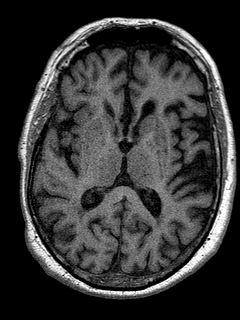
[im 84/144]
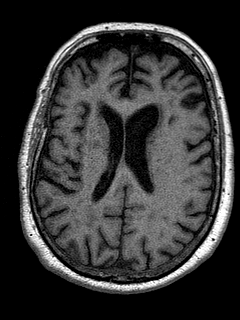
[im 96/144]
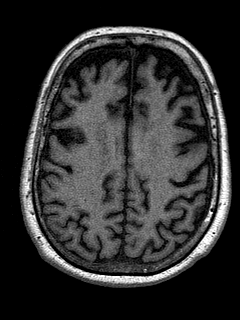
[im 108/144]
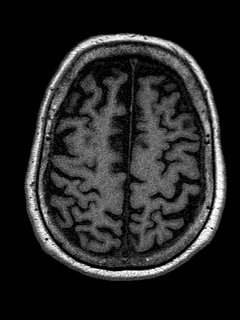
[im 120/144]
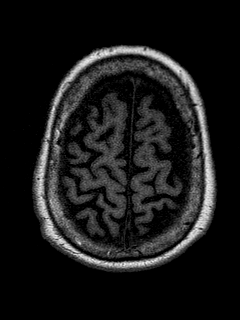
[im 132/144]
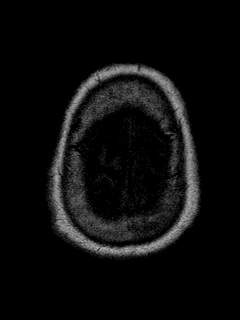
[im 144/144]
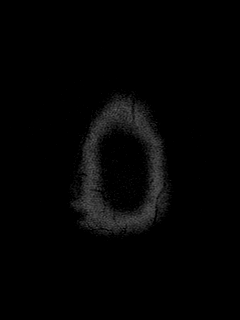

[Series 11: T2 · coronal · 5.0mm · 0.45mm/px · 2 of 27 slices shown (2 of 2)]
[im 1/27]
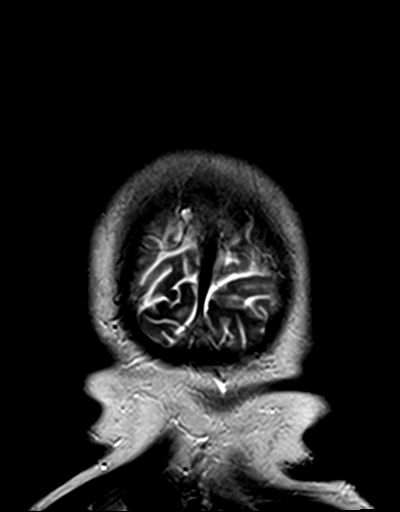
[im 27/27]
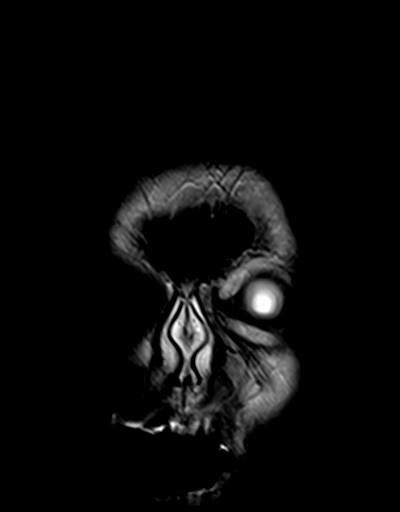

[48 of 48 positions shown; findings below may reference images not displayed]

FINDINGS: MRI HEAD FINDINGS

Brain: Diffusion imaging does not show any acute or subacute
infarction. Mild chronic small-vessel ischemic change affects the
pons. No focal cerebellar insult. Cerebral hemispheres show moderate
chronic small-vessel ischemic changes of the white matter. No
cortical or large vessel territory infarction. No mass lesion,
hemorrhage, hydrocephalus or extra-axial collection.

Vascular: Major vessels at the base of the brain show flow.

Skull and upper cervical spine: Negative

Sinuses/Orbits: Small amount of fluid in the right maxillary sinus.
Other sinuses clear. Orbits negative.

Other: None

MRA HEAD FINDINGS

Both internal carotid arteries patent through the skull base and
siphon regions. The anterior and middle cerebral vessels are patent.
Moderate stenosis of the right middle cerebral artery.

Both vertebral arteries widely patent to the basilar. No basilar
stenosis. Posterior circulation branch vessels are normal.

MRA NECK FINDINGS

Antegrade flow in both vertebral arteries. No abnormality in the
region studied.

Both common carotid arteries are patent to the bifurcation regions.
The study suffers from motion degradation. On the left, there is no
visible stenosis at the bifurcation. On the right, there [DATE]%
stenosis of the proximal ICA. Detail is poor because of motion
noncontrast technique.
IMPRESSION: MRI head: No acute finding. Moderate chronic small-vessel ischemic
changes of the pons and cerebral hemispheric white matter.

MRA head: No large or medium vessel occlusion. Moderate stenosis of
the right middle cerebral artery.

MRA neck: Limited by motion and noncontrast technique. Antegrade
flow in both vertebral arteries. No ICA stenosis on the left.
Question 50% stenosis of the ICA on right.

## 2020-02-16 IMAGING — MR MR MRA HEAD W/O CM
1 series · 22 of 48 positions shown · non-contrast
Comparison: CT angiography of the neck [DATE]

CLINICAL DATA: Blurred vision, intermittent since [REDACTED].



[Series 3: tof_3d_multi-slab · axial · 0.7mm · 0.35mm/px · z∈[-42,+49]mm · 22 of 143 slices shown]
[im 1/143]
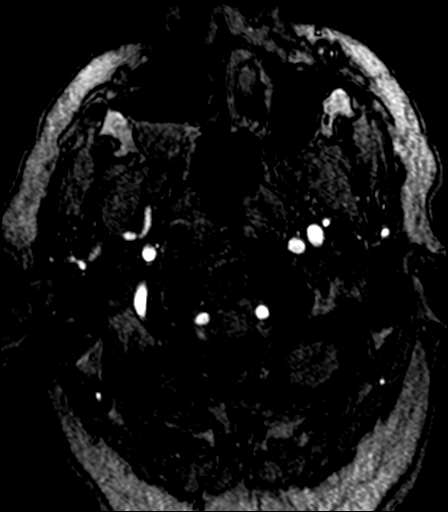
[im 4/143]
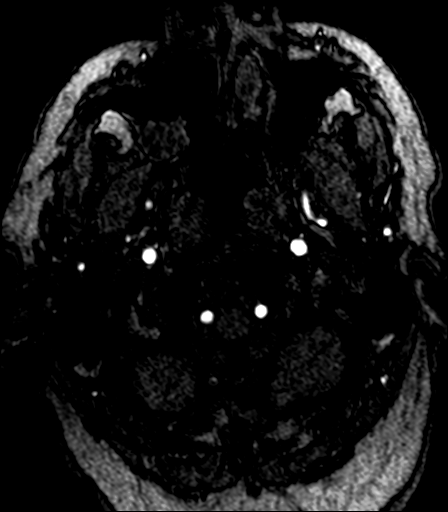
[im 7/143]
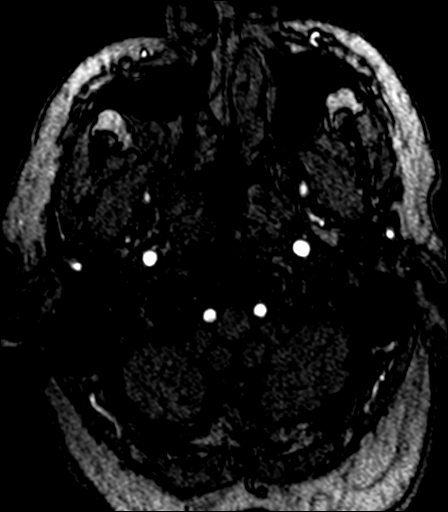
[im 10/143]
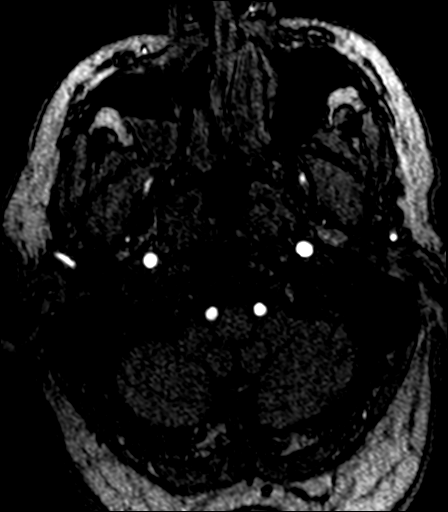
[im 13/143]
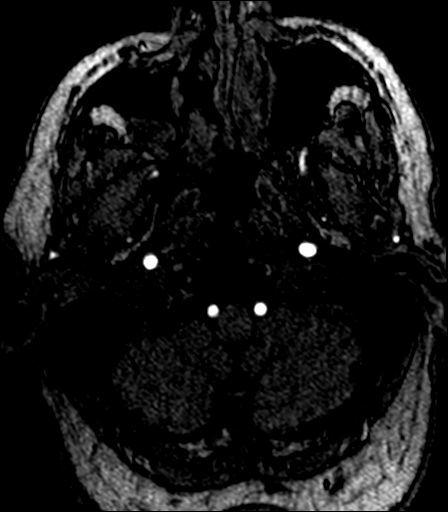
[im 16/143]
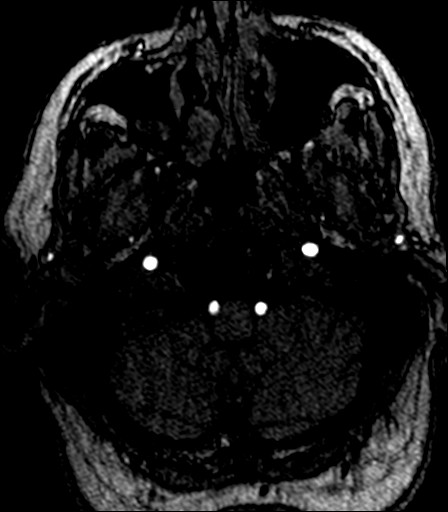
[im 19/143]
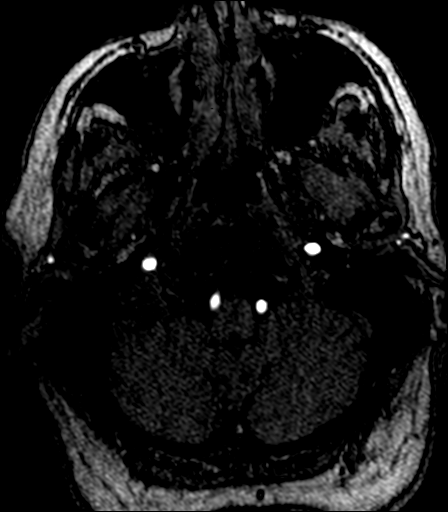
[im 22/143]
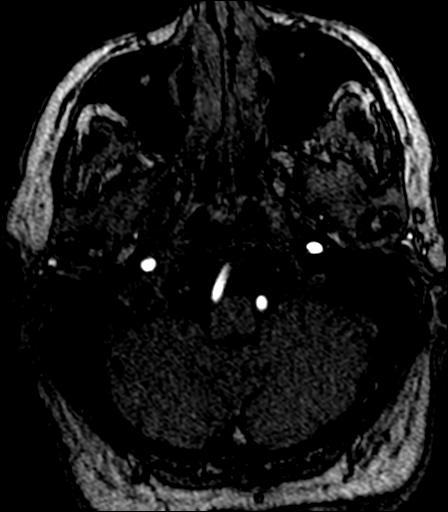
[im 25/143]
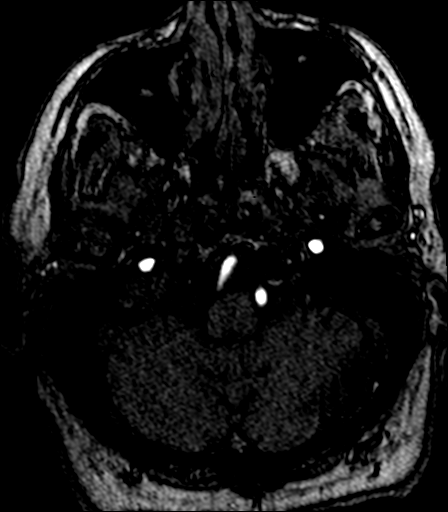
[im 28/143]
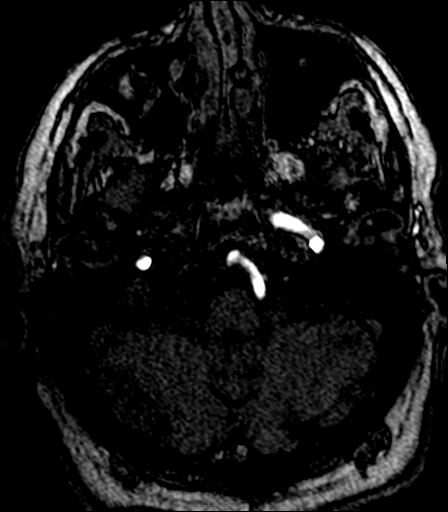
[im 31/143]
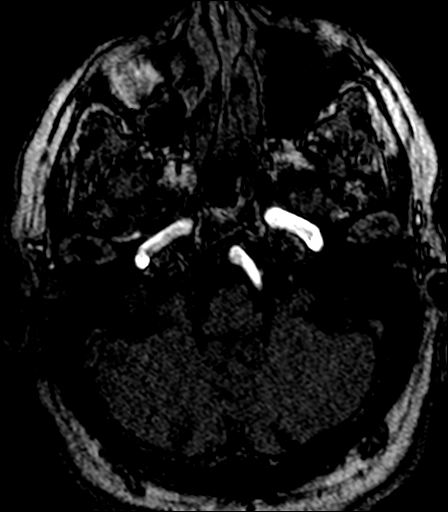
[im 34/143]
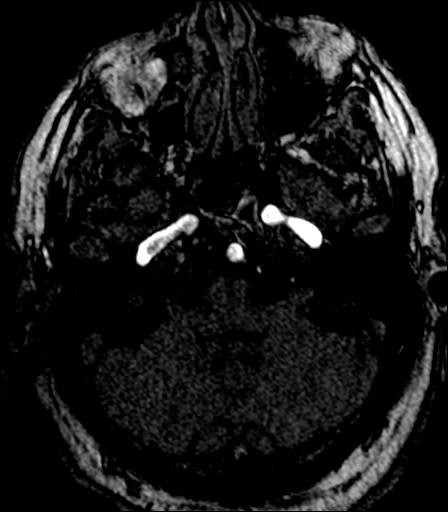
[im 37/143]
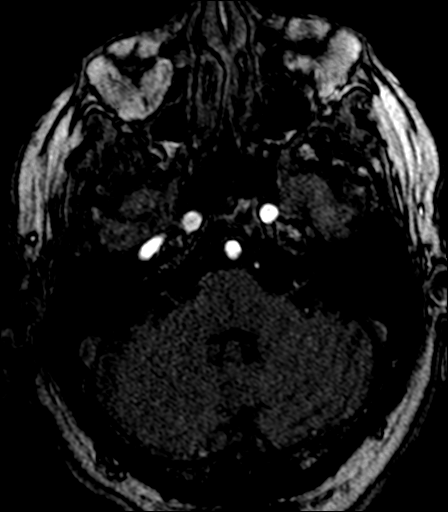
[im 40/143]
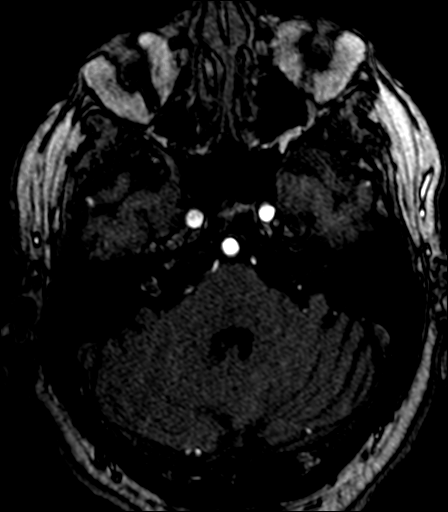
[im 46/143]
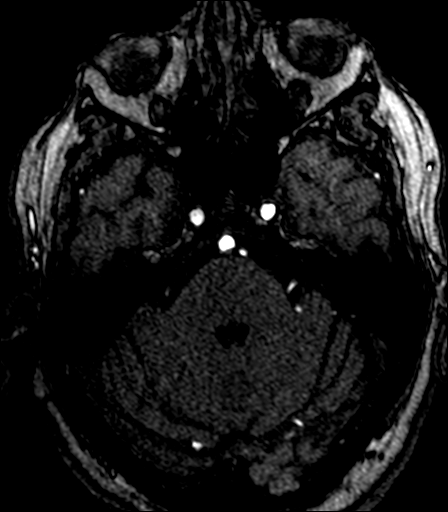
[im 64/143]
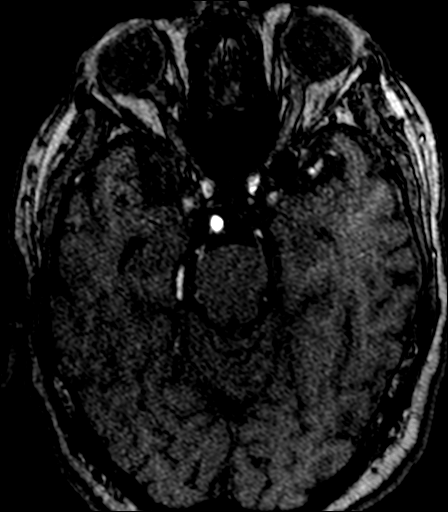
[im 73/143]
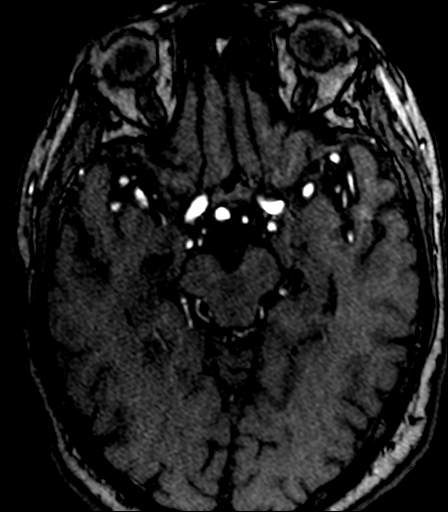
[im 82/143]
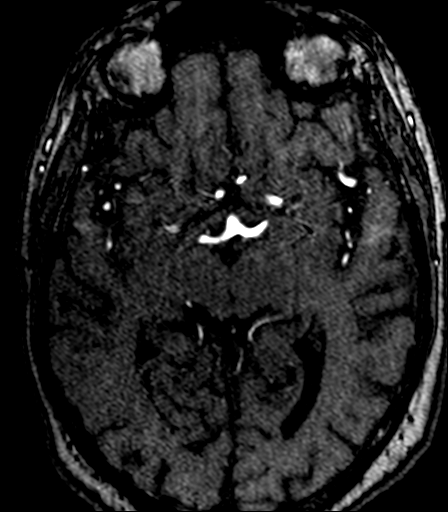
[im 100/143]
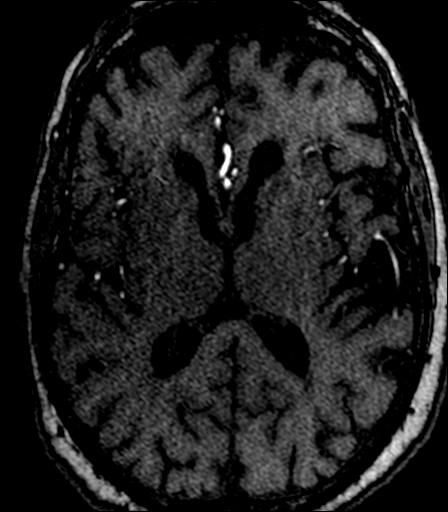
[im 118/143]
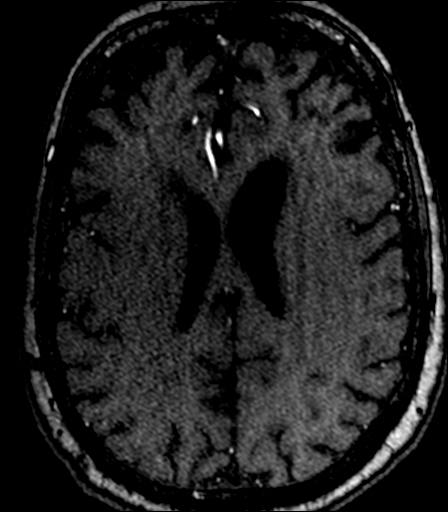
[im 121/143]
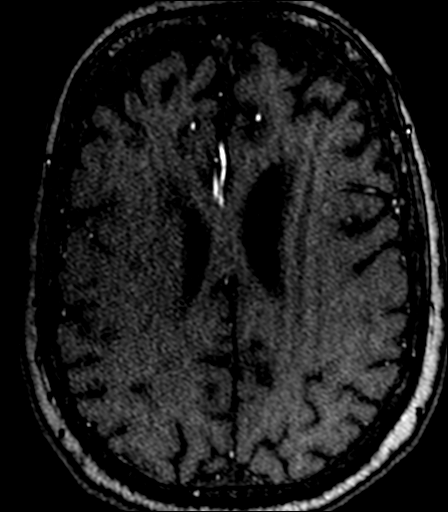
[im 136/143]
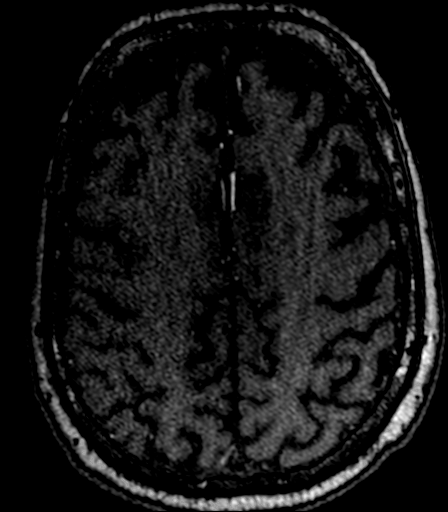

[22 of 48 positions shown; findings below may reference images not displayed]

FINDINGS: MRI HEAD FINDINGS

Brain: Diffusion imaging does not show any acute or subacute
infarction. Mild chronic small-vessel ischemic change affects the
pons. No focal cerebellar insult. Cerebral hemispheres show moderate
chronic small-vessel ischemic changes of the white matter. No
cortical or large vessel territory infarction. No mass lesion,
hemorrhage, hydrocephalus or extra-axial collection.

Vascular: Major vessels at the base of the brain show flow.

Skull and upper cervical spine: Negative

Sinuses/Orbits: Small amount of fluid in the right maxillary sinus.
Other sinuses clear. Orbits negative.

Other: None

MRA HEAD FINDINGS

Both internal carotid arteries patent through the skull base and
siphon regions. The anterior and middle cerebral vessels are patent.
Moderate stenosis of the right middle cerebral artery.

Both vertebral arteries widely patent to the basilar. No basilar
stenosis. Posterior circulation branch vessels are normal.

MRA NECK FINDINGS

Antegrade flow in both vertebral arteries. No abnormality in the
region studied.

Both common carotid arteries are patent to the bifurcation regions.
The study suffers from motion degradation. On the left, there is no
visible stenosis at the bifurcation. On the right, there [DATE]%
stenosis of the proximal ICA. Detail is poor because of motion
noncontrast technique.
IMPRESSION: MRI head: No acute finding. Moderate chronic small-vessel ischemic
changes of the pons and cerebral hemispheric white matter.

MRA head: No large or medium vessel occlusion. Moderate stenosis of
the right middle cerebral artery.

MRA neck: Limited by motion and noncontrast technique. Antegrade
flow in both vertebral arteries. No ICA stenosis on the left.
Question 50% stenosis of the ICA on right.

## 2020-02-23 DIAGNOSIS — H532 Diplopia: Secondary | ICD-10-CM | POA: Diagnosis not present

## 2020-03-06 ENCOUNTER — Other Ambulatory Visit: Payer: Self-pay | Admitting: *Deleted

## 2020-03-06 DIAGNOSIS — I6523 Occlusion and stenosis of bilateral carotid arteries: Secondary | ICD-10-CM

## 2020-03-16 ENCOUNTER — Other Ambulatory Visit: Payer: Self-pay

## 2020-03-16 ENCOUNTER — Inpatient Hospital Stay: Payer: Medicare PPO | Attending: Oncology | Admitting: Oncology

## 2020-03-16 ENCOUNTER — Inpatient Hospital Stay: Payer: Medicare PPO

## 2020-03-16 VITALS — BP 115/47 | HR 74 | Temp 98.3°F | Resp 17 | Ht 66.0 in | Wt 189.8 lb

## 2020-03-16 DIAGNOSIS — Z9049 Acquired absence of other specified parts of digestive tract: Secondary | ICD-10-CM | POA: Insufficient documentation

## 2020-03-16 DIAGNOSIS — E538 Deficiency of other specified B group vitamins: Secondary | ICD-10-CM | POA: Diagnosis not present

## 2020-03-16 DIAGNOSIS — K219 Gastro-esophageal reflux disease without esophagitis: Secondary | ICD-10-CM | POA: Diagnosis not present

## 2020-03-16 DIAGNOSIS — I10 Essential (primary) hypertension: Secondary | ICD-10-CM | POA: Insufficient documentation

## 2020-03-16 DIAGNOSIS — G629 Polyneuropathy, unspecified: Secondary | ICD-10-CM | POA: Diagnosis not present

## 2020-03-16 DIAGNOSIS — Z85038 Personal history of other malignant neoplasm of large intestine: Secondary | ICD-10-CM | POA: Insufficient documentation

## 2020-03-16 DIAGNOSIS — J449 Chronic obstructive pulmonary disease, unspecified: Secondary | ICD-10-CM | POA: Insufficient documentation

## 2020-03-16 DIAGNOSIS — C186 Malignant neoplasm of descending colon: Secondary | ICD-10-CM

## 2020-03-16 LAB — CEA (IN HOUSE-CHCC): CEA (CHCC-In House): 1.46 ng/mL (ref 0.00–5.00)

## 2020-03-16 NOTE — Progress Notes (Signed)
  [place] OFFICE PROGRESS NOTE   Diagnosis: Colon cancer  INTERVAL HISTORY:   Glen [name] returns as scheduled.  He feels well.  No difficulty with bowel function.  Good appetite.  He had a brain MRI last month for evaluation of diplopia.  Objective:  Vital signs in last 24 hours:  Blood pressure (!) 115/47, pulse 74, temperature 98.3 F (36.8 C), temperature source Tympanic, resp. rate 17, height $RemoveBe'5\' 6"'dqvgJhcOb$  (1.676 m), weight 189 lb 12.8 oz (86.1 kg), SpO2 98 %.     Lymphatics: No cervical, supraclavicular, axillary, or inguinal nodes Resp: Lungs clear bilaterally Cardio: Regular rate and rhythm GI: No hepatosplenomegaly, no mass, nontender Vascular: No leg edema   Lab Results:  Lab Results  Component Value Date   WBC 5.4 07/25/2018   HGB 8.9 (L) 07/25/2018   HCT 27.7 (L) 07/25/2018   MCV 93.3 07/25/2018   PLT 245 07/25/2018   NEUTROABS 5.3 07/17/2018    CMP  Lab Results  Component Value Date   NA 141 07/25/2018   K 3.3 (L) 07/25/2018   CL 105 07/25/2018   CO2 29 07/25/2018   GLUCOSE 107 (H) 07/25/2018   BUN <5 (L) 07/25/2018   CREATININE 0.81 07/25/2018   CALCIUM 8.1 (L) 07/25/2018   PROT 6.0 (L) 07/17/2018   ALBUMIN 3.4 (L) 07/17/2018   AST 19 07/17/2018   ALT 14 07/17/2018   ALKPHOS 85 07/17/2018   BILITOT 0.6 07/17/2018   GFRNONAA >60 07/25/2018   GFRAA >60 07/25/2018    Lab Results  Component Value Date   CEA1 1.89 09/14/2019     Medications: I have reviewed the patient's current medications.   Assessment/Plan: 1. Colon cancer   Colonoscopy by Dr. Oletta Lamas 06/22/2018-polypoid completely obstructing large mass was found in the proximal sigmoid colon.  Pathology showed fragments of ulcer and inflammatory debris; no viable tissue present.    CT abdomen/pelvis 06/30/2018-severe descending and sigmoid diverticulosis.  There was a focal dilatation of the mid descending colon to approximately 8.5 x 6.2 cm.  There was no overt exophytic  mass or mesenteric lymphadenopathy.  No evidence of abdominal metastatic disease.    07/21/2018 open left colectomy with primary anastomosis, primary repair of umbilical hernia by Dr. Dalbert Batman.  Pathology showed invasive moderately differentiated adenocarcinoma with mucinous features measuring 7.5 cm.  There was circumferential involvement of the descending colon with luminal obstruction.  Carcinoma focally invaded into the pericolonic soft tissue.  Margins negative.  No lymphovascular or perineural invasion.  21 lymph nodes negative for cancer; loss of expression MLH1 and PMS2; MSI high.  MLH1 hyper methylation present  Colonoscopy 05/27/2019-polyps removed from the sigmoid, descending, cecum, ascending, and transverse colon, nodular mucosa at the colonic anastomosis biopsied-benign ulcerated mucosa, polyps were tubular adenomas, mucosal prolapse polyps,  and a hyperplastic polyp 2. Upper endoscopy 06/22/2018-esophageal mucosal changes suggestive of long segment Barrett's esophagus.  A few gastric polyps.  Normal examined duodenum.  GERD.  Stomach biopsy-fundic gland polyp.  Negative for dysplasia.  Esophagus biopsy-Barrett's mucosa.  Negative for dysplasia.   3. COPD 4. Hypertension 5. B12 deficiency 6. Peripheral neuropathy   Disposition: Mr. Cyr is in remission from colon cancer.  We will follow up on the CEA from today.  He will return for an office visit and CEA in 6 months.  He is scheduled for endoscopic surveillance with Dr. Watt Climes in January.  Betsy Coder, MD  03/16/2020  10:28 AM

## 2020-03-21 DIAGNOSIS — C187 Malignant neoplasm of sigmoid colon: Secondary | ICD-10-CM | POA: Diagnosis not present

## 2020-03-23 DIAGNOSIS — M5136 Other intervertebral disc degeneration, lumbar region: Secondary | ICD-10-CM | POA: Diagnosis not present

## 2020-03-27 ENCOUNTER — Other Ambulatory Visit: Payer: Self-pay | Admitting: Surgery

## 2020-03-27 DIAGNOSIS — C187 Malignant neoplasm of sigmoid colon: Secondary | ICD-10-CM

## 2020-04-05 ENCOUNTER — Ambulatory Visit (INDEPENDENT_AMBULATORY_CARE_PROVIDER_SITE_OTHER): Payer: Medicare PPO | Admitting: Vascular Surgery

## 2020-04-05 ENCOUNTER — Encounter: Payer: Self-pay | Admitting: Vascular Surgery

## 2020-04-05 ENCOUNTER — Ambulatory Visit (HOSPITAL_COMMUNITY)
Admission: RE | Admit: 2020-04-05 | Discharge: 2020-04-05 | Disposition: A | Discharge status: Home / Self Care | Payer: Medicare PPO | Source: Ambulatory Visit | Attending: Vascular Surgery | Admitting: Vascular Surgery

## 2020-04-05 ENCOUNTER — Other Ambulatory Visit: Payer: Self-pay

## 2020-04-05 VITALS — BP 112/51 | HR 70 | Temp 98.0°F | Resp 18 | Ht 67.0 in | Wt 190.0 lb

## 2020-04-05 DIAGNOSIS — I6523 Occlusion and stenosis of bilateral carotid arteries: Secondary | ICD-10-CM | POA: Diagnosis not present

## 2020-04-05 NOTE — Progress Notes (Signed)
REASON FOR VISIT:   Follow-up of right carotid stenosis   MEDICAL ISSUES:   RIGHT CAROTID STENOSIS: This patient has an asymptomatic 60 to 79% right carotid stenosis.  He has no significant stenosis on the left.  I explained we would not consider right carotid endarterectomy unless the stenosis progressed to greater than 80% or he develop new right hemispheric symptoms.  He is on aspirin and is on a statin.  He is not a smoker.  I ordered a follow-up carotid duplex scan in 6 months and I will see him back at that time.  He has been having some headaches and occasional blurred vision and explained that I did not think this could be attributed to his right carotid stenosis.   HPI:   Glen [name] is a pleasant 79 y.o. male who I last saw on 08/11/2019.  I been following the right carotid stenosis.  The time of his last visit he had an asymptomatic 60 to 79% right carotid stenosis with no significant stenosis on the left.  Both vertebral arteries were patent with antegrade flow.  He was on aspirin and was on a statin.  I I ordered a follow-up carotid duplex scan in 6 months and he returns for that visit.  Since I saw him last he denies any history of stroke, TIAs, expressive or receptive aphasia, or amaurosis fugax.  His only symptoms have been some pressure in his head which appears to be related to exertion and is sometimes associated with blurred vision.  This prompted a work-up including an MRA of the head and neck and an MRI of the brain.  Those results are discussed below and were fairly unremarkable.  He is on aspirin and is on a statin.  He quit smoking in 2012.  Past Medical History:  Diagnosis Date   Allergic rhinitis, cause unspecified    Anxiety    Asthma    Cancer ([place]) 07/21/2018   colon   COPD (chronic obstructive pulmonary disease) (HCC)    Depression    Dyspnea    upon exertion   GERD (gastroesophageal reflux disease)    Hypertension    PUD (peptic  ulcer disease)    avoids aspirin   Sleep apnea    uses C-PAP    Family History  Problem Relation Age of Onset   Alcohol abuse Father        commited suicide   Heart disease Father        had bypass   Breast cancer Mother    Prostate cancer Maternal Uncle        several     SOCIAL HISTORY: Social History   Tobacco Use   Smoking status: Former Smoker    Packs/day: 0.50    Types: Cigarettes    Quit date: 04/12/2011    Years since quitting: 8.9   Smokeless tobacco: Never Used  Substance Use Topics   Alcohol use: Yes    Alcohol/week: 0.0 - 6.0 standard drinks    Comment: 1 liquor drink, 1 glass wine daily    Allergies  Allergen Reactions   Aspirin Other (See Comments)    pt has bleeding ulcers   Lisinopril Cough    Current Outpatient Medications  Medication Sig Dispense Refill   acetaminophen (TYLENOL) 650 MG CR tablet Take 1,950 mg by mouth at bedtime.     ALPRAZolam (XANAX) 0.25 MG tablet Take 0.25 mg by mouth daily as needed for anxiety.  amLODipine (NORVASC) 10 MG tablet Take 10 mg by mouth every evening.      aspirin EC 81 MG tablet Take 81 mg by mouth at bedtime.      atorvastatin (LIPITOR) 40 MG tablet Take 40 mg by mouth at bedtime.      diphenhydrAMINE (BENADRYL) 25 MG tablet Take 75 mg by mouth at bedtime.      gabapentin (NEURONTIN) 300 MG capsule TAKE 2 CAPSULES(600 MG) BY MOUTH AT BEDTIME 180 capsule 1   hydrochlorothiazide (MICROZIDE) 12.5 MG capsule Take 12.5 mg by mouth daily.      levalbuterol (XOPENEX) 0.63 MG/3ML nebulizer solution USE 1 VIAL VIA NEBULIZER EVERY 4 HOURS AS NEEDED FOR WHEEZING OR SHORTNESS OF BREATH 1650 mL 1   omeprazole (PRILOSEC) 20 MG capsule Take 20 mg by mouth daily.       primidone (MYSOLINE) 50 MG tablet Take 1 tablet (50 mg total) by mouth in the morning and at bedtime. 180 tablet 1   PROAIR HFA 108 (90 Base) MCG/ACT inhaler INHALE 2 PUFFS INTO THE LUNGS EVERY 4 HOURS AS NEEDED FOR WHEEZING OR  SHORTNESS OF BREATH. 8.5 g 0   sertraline (ZOLOFT) 100 MG tablet Take 100 mg by mouth daily.   4   telmisartan (MICARDIS) 40 MG tablet Take 40 mg by mouth daily.     TRELEGY ELLIPTA 100-62.5-25 MCG/INH AEPB INHALE 1 PUFF INTO THE LUNGS DAILY 180 each 1   vitamin B-12 (CYANOCOBALAMIN) 1000 MCG tablet Take 1,000 mcg by mouth daily.     gabapentin (NEURONTIN) 100 MG capsule TAKE 1 CAPSULE BY MOUTH IN AM AND 1 CAPSULE BY MOUTH IN AFTERNOON (Patient taking differently: TAKE 1 CAPSULE BY MOUTH IN am) 90 capsule 1   No current facility-administered medications for this visit.    REVIEW OF SYSTEMS:  [X]  denotes positive finding, [ ]  denotes negative finding Cardiac  Comments:  Chest pain or chest pressure:    Shortness of breath upon exertion:    Short of breath when lying flat:    Irregular heart rhythm:        Vascular    Pain in calf, thigh, or hip brought on by ambulation:    Pain in feet at night that wakes you up from your sleep:     Blood clot in your veins:    Leg swelling:         Pulmonary    Oxygen at home:    Productive cough:     Wheezing:         Neurologic    Sudden weakness in arms or legs:     Sudden numbness in arms or legs:     Sudden onset of difficulty speaking or slurred speech:    Temporary loss of vision in one eye:     Problems with dizziness:         Gastrointestinal    Blood in stool:     Vomited blood:         Genitourinary    Burning when urinating:     Blood in urine:        Psychiatric    Major depression:         Hematologic    Bleeding problems:    Problems with blood clotting too easily:        Skin    Rashes or ulcers:        Constitutional    Fever or chills:     PHYSICAL EXAM:  Vitals:   04/05/20 1346 04/05/20 1353  BP: (!) 122/57 (!) 112/51  Pulse: 71 70  Resp: 18   Temp: 98 F (36.7 C)   TempSrc: Temporal   SpO2: 98%   Weight: 190 lb (86.2 kg)   Height: 5\' 7"  (1.702 m)     GENERAL: The patient is a  well-nourished male, in no acute distress. The vital signs are documented above. CARDIAC: There is a regular rate and rhythm.  VASCULAR: I do not detect carotid bruits. Both feet are warm and well perfused. He has no significant lower extremity swelling. PULMONARY: There is good air exchange bilaterally without wheezing or rales. ABDOMEN: Soft and non-tender with normal pitched bowel sounds.  MUSCULOSKELETAL: There are no major deformities or cyanosis. NEUROLOGIC: No focal weakness or paresthesias are detected. SKIN: There are no ulcers or rashes noted. PSYCHIATRIC: The patient has a normal affect.  DATA:    CAROTID DUPLEX: I have independently interpreted his carotid duplex scan today.  On the right side he has a 60 to 79% stenosis.  The velocities increased slightly on the right.  The right vertebral artery is patent with antegrade flow.  On the left side there is no significant carotid stenosis.  The left vertebral artery is patent with antegrade flow.  MR ANGIO HEAD: I reviewed his MR angio of the head that was done on 02/16/2020.  This was done because he was having blurred vision intermittently since September.  The MRI of the head showed some moderate stenosis in the right middle cerebral artery.  MRA of the neck showed possibly 50% right carotid stenosis with no significant stenosis on the left.   MRI HEAD: His MRI of the head on 02/16/2020 did not show any acute findings.  Deitra Mayo [facility] ([place]) [phone]

## 2020-04-14 ENCOUNTER — Ambulatory Visit
Admission: RE | Admit: 2020-04-14 | Discharge: 2020-04-14 | Disposition: A | Discharge status: Home / Self Care | Payer: Medicare PPO | Source: Ambulatory Visit | Attending: Surgery | Admitting: Surgery

## 2020-04-14 ENCOUNTER — Other Ambulatory Visit: Payer: Self-pay

## 2020-04-14 DIAGNOSIS — C187 Malignant neoplasm of sigmoid colon: Secondary | ICD-10-CM

## 2020-04-14 DIAGNOSIS — J432 Centrilobular emphysema: Secondary | ICD-10-CM | POA: Diagnosis not present

## 2020-04-14 DIAGNOSIS — C19 Malignant neoplasm of rectosigmoid junction: Secondary | ICD-10-CM | POA: Diagnosis not present

## 2020-04-14 IMAGING — CT CT ABD-PELV W/ CM
3 series · 14 of 32 positions shown, 17 images · IV contrast (APPLIED)
Comparison: Examinations dating back to [H5] with most recent
comparison from [DATE]

CLINICAL DATA: Colorectal cancer follow-up in this 79-year-old male

EXAM:
CT CHEST, ABDOMEN, AND PELVIS WITH CONTRAST
TECHNIQUE: Multidetector CT imaging of the chest, abdomen and pelvis was
performed following the standard protocol during bolus
administration of intravenous contrast.
CONTRAST:  80mL [H5] IOPAMIDOL ([H5]) INJECTION 61%

[Series 2: chest/abd/pelvis w/cm · axial · 0.94mm/px · z∈[-571,-51]mm · 8 of 128 slices shown]
[im 12/128  soft-tissue]
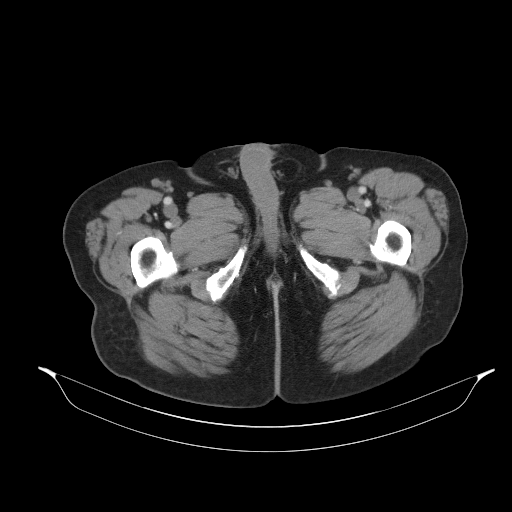
[im 24/128  soft-tissue]
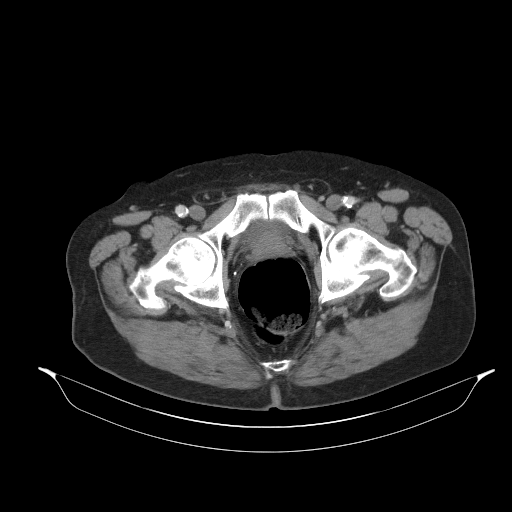
[im 47/128  soft-tissue]
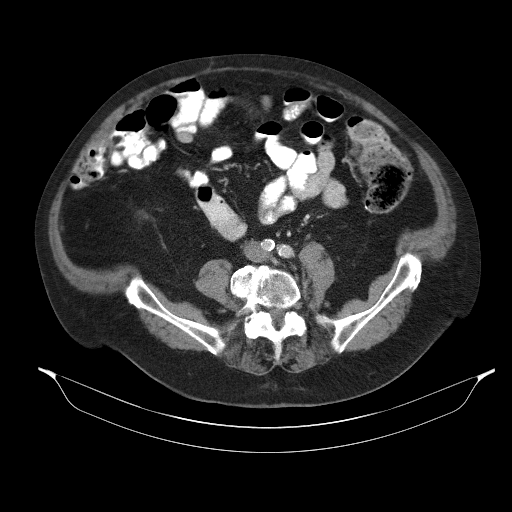
[im 58/128  soft-tissue]
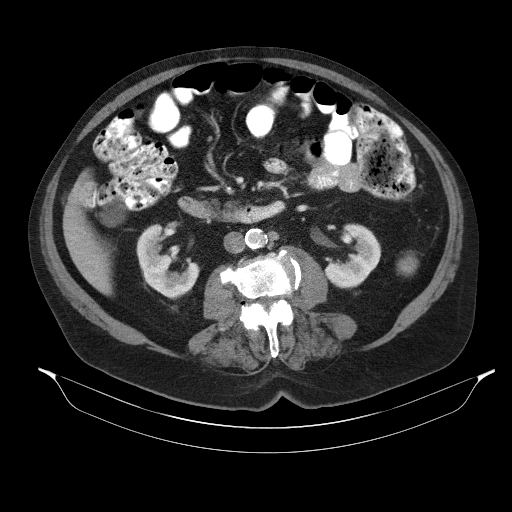
[im 70/128  soft-tissue]
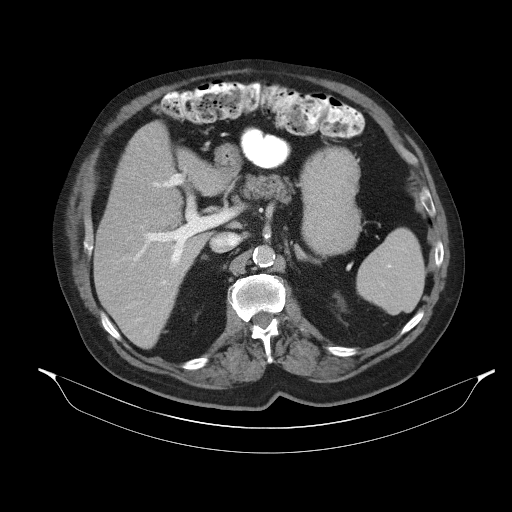
[im 81/128  soft-tissue]
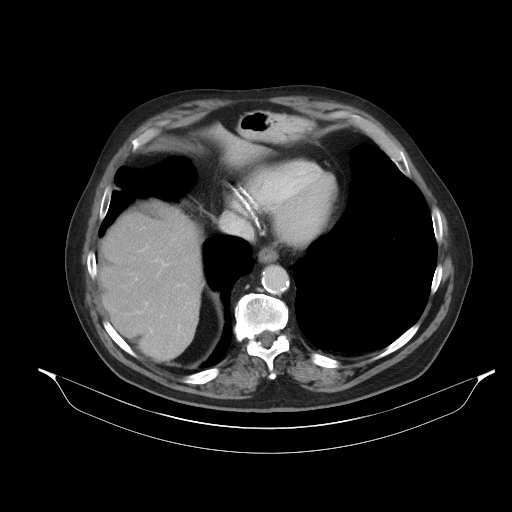
[im 104/128  soft-tissue]
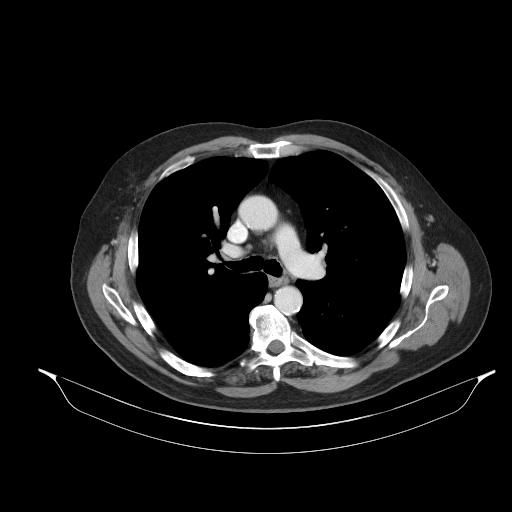
[im 116/128  soft-tissue]
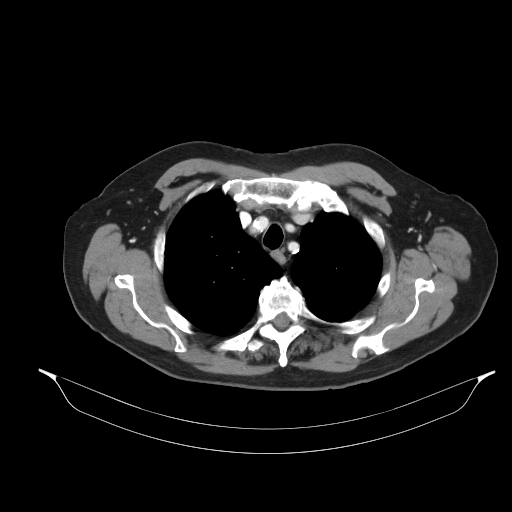

[Series 4: lung · axial · 0.94mm/px · z∈[-287,-161]mm · 4 of 159 slices shown]
[im 11/159  bone]
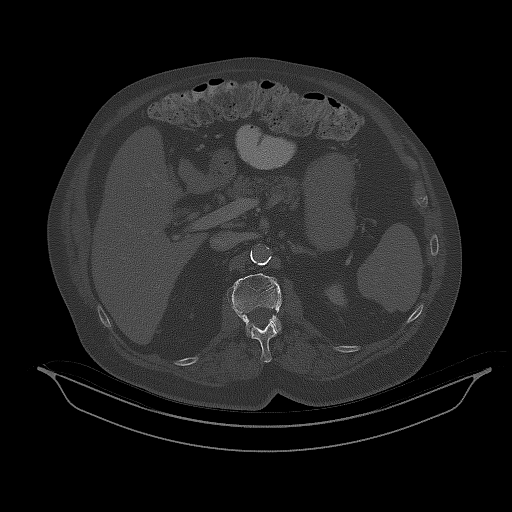
[im 32/159  bone]
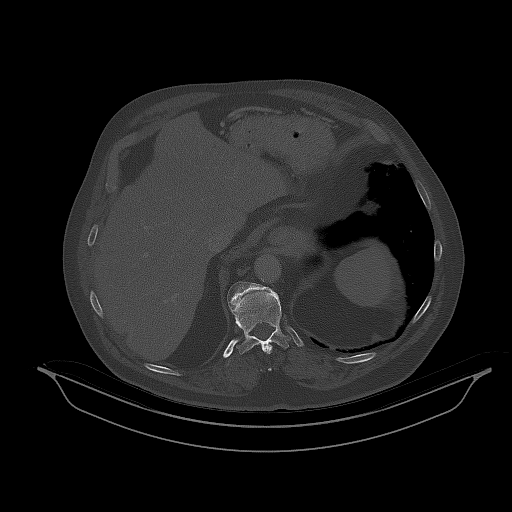
[im 53/159  bone]
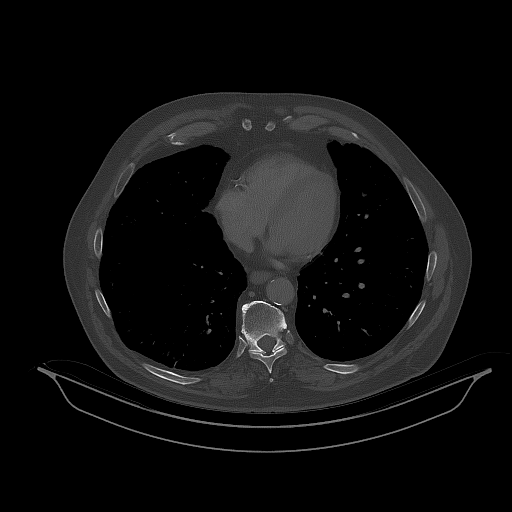
[im 74/159  bone]
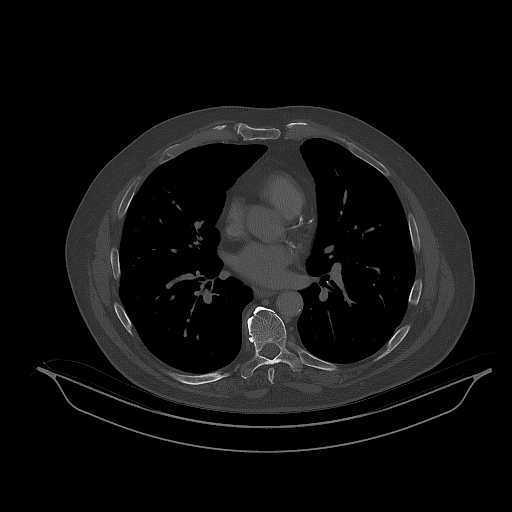

[Series 7: renal delay · axial · delayed · 0.94mm/px · z∈[-352,-292]mm · 2 of 38 slices shown, 5 images]
[im 13/38  soft-tissue]
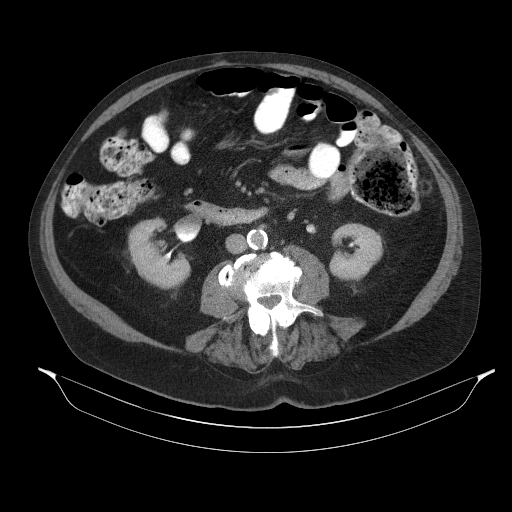
[im 13/38  lung]
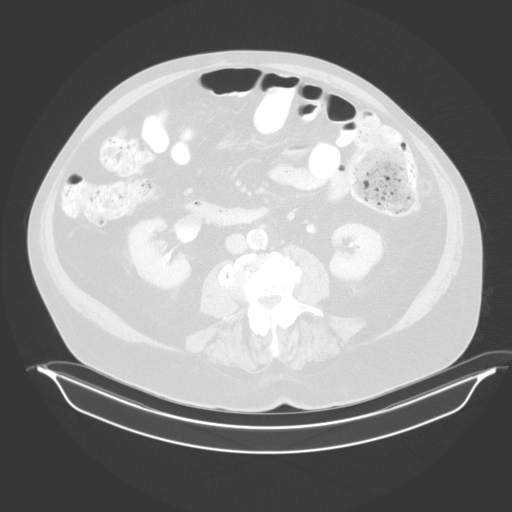
[im 13/38  bone]
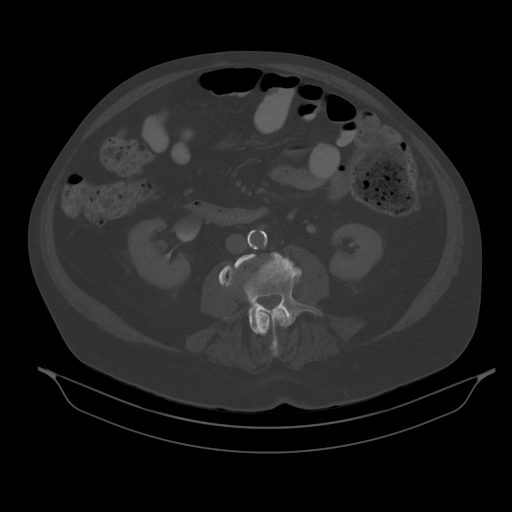
[im 25/38  soft-tissue]
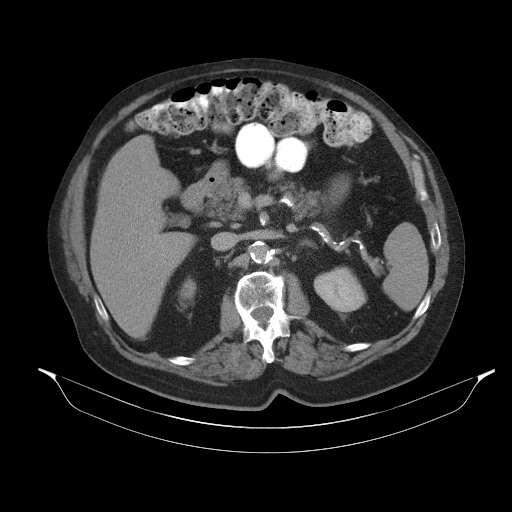
[im 25/38  lung]
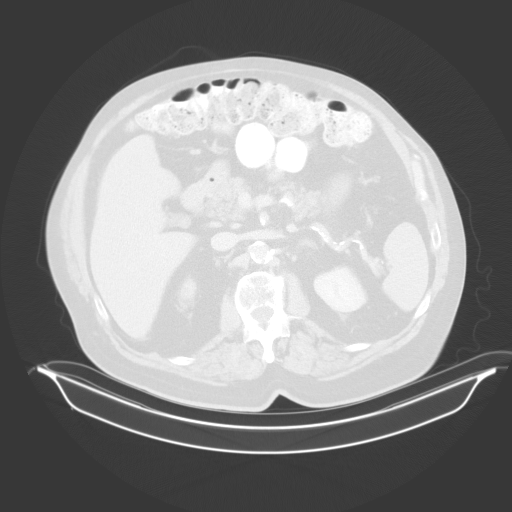

[14 of 32 positions shown; findings below may reference images not displayed]

FINDINGS: CT CHEST FINDINGS

Cardiovascular: Calcified and noncalcified atheromatous plaque in
the thoracic aorta. No aneurysmal dilation. Three-vessel coronary
artery disease. Heart size is normal without pericardial effusion.
Central pulmonary vasculature is unremarkable.

Mediastinum/Nodes: Thoracic inlet structures are normal. No axillary
lymphadenopathy. No mediastinal lymphadenopathy. No hilar
lymphadenopathy.

Lungs/Pleura: Basilar atelectasis. No consolidation. No pleural
effusion. Airways are patent. Pulmonary emphysema, centrilobular
predominant and mild-to-moderate worse at the lung apices.

Small nodule at the RIGHT lung apex (image 22 of series 4) 4 mm not
changed since [H5]

Grouped nodules in the posterior RIGHT upper lobe just above the
major fissure with branching configuration, largest approximately 4
mm (image 49, series 4)

Musculoskeletal: See below for full musculoskeletal details. No
chest wall mass.

CT ABDOMEN PELVIS FINDINGS

Hepatobiliary: Low-attenuation of hepatic parenchyma no
pericholecystic stranding. No biliary duct dilation. No focal,
suspicious hepatic lesion. Portal vein is patent.

Pancreas: Normal

Spleen: Normal

Adrenals/Urinary Tract: Adrenal glands are normal. Symmetric renal
enhancement. No hydronephrosis. No suspicious renal lesion. Small
low-density lesion is in the LEFT kidney are compatible with small
cysts.

Stomach/Bowel: No acute gastrointestinal process. Post LEFT partial
colectomy. Sigmoid diverticular disease.

Vascular/Lymphatic: Calcified atheromatous plaque in the abdominal
aorta. No aneurysmal dilation.

Enlarged lymph nodes seen along the LEFT periaortic chain on the
previous exam, largest now approximately 1 cm (image 70, series 2)
previously approximately 1.2 cm. No signs of new or enlarging lymph
nodes.

Reproductive: Mildly heterogeneous prostate, nonspecific finding
unchanged from previous imaging. Urinary bladder under distended. No
pelvic adenopathy.

Other: Small fat containing bilateral inguinal hernias LEFT slightly
greater than RIGHT.

Musculoskeletal: Spinal degenerative changes. No acute or
destructive bone process.
IMPRESSION: 1. Top-normal size of LEFT para-aortic lymph nodes which were
previously slightly above 1 cm. Attention on follow-up.
2. No definitive signs of metastatic disease to the chest, abdomen
or pelvis with postoperative changes of LEFT hemicolectomy.
3. New area of grouped nodularity in the RIGHT upper lobe
posteriorly just above the fissure may be infectious or inflammatory
given branching and tree-in-bud characteristics. Consider
three-month follow-up to ensure resolution.
4. Pulmonary emphysema, centrilobular predominant and
mild-to-moderate worse at the lung apices.
5. Three-vessel coronary artery disease.
6. Hepatic steatosis.
7. Sigmoid diverticular disease.
8. Small fat containing bilateral inguinal hernias LEFT slightly
greater than RIGHT.
9. Emphysema and aortic atherosclerosis.

Aortic Atherosclerosis ([H5]-[H5]) and Emphysema ([H5]-[H5]).

## 2020-04-14 IMAGING — CT CT CHEST W/ CM
3 series · 14 of 32 positions shown, 17 images · IV contrast (APPLIED)
Comparison: Examinations dating back to [H5] with most recent
comparison from [DATE]

CLINICAL DATA: Colorectal cancer follow-up in this 79-year-old male

EXAM:
CT CHEST, ABDOMEN, AND PELVIS WITH CONTRAST
TECHNIQUE: Multidetector CT imaging of the chest, abdomen and pelvis was
performed following the standard protocol during bolus
administration of intravenous contrast.
CONTRAST:  80mL [H5] IOPAMIDOL ([H5]) INJECTION 61%

[Series 2: chest/abd/pelvis w/cm · axial · 0.94mm/px · z∈[-571,-51]mm · 8 of 128 slices shown]
[im 12/128  soft-tissue]
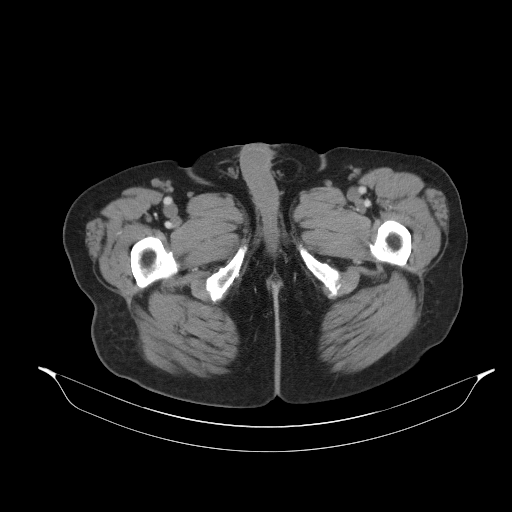
[im 24/128  soft-tissue]
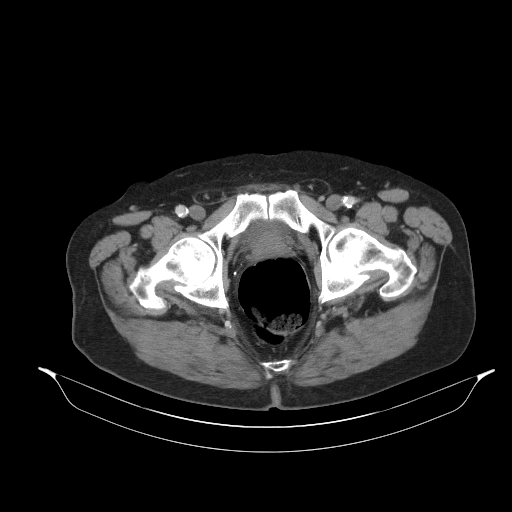
[im 47/128  soft-tissue]
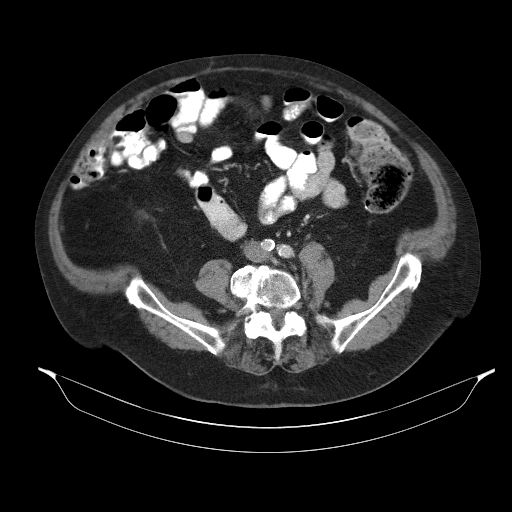
[im 58/128  soft-tissue]
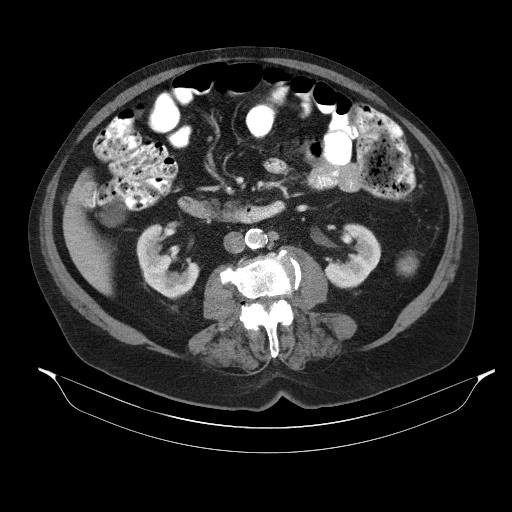
[im 70/128  soft-tissue]
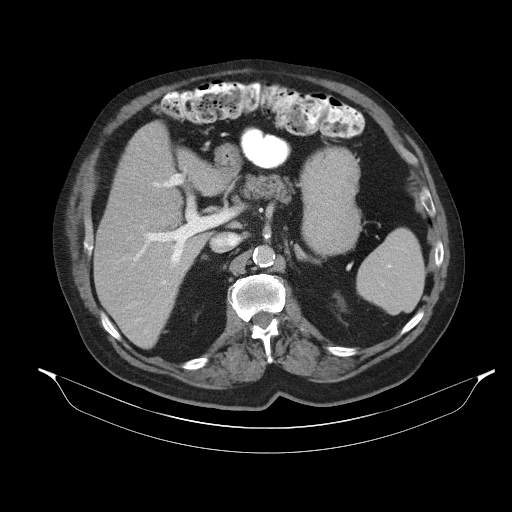
[im 81/128  soft-tissue]
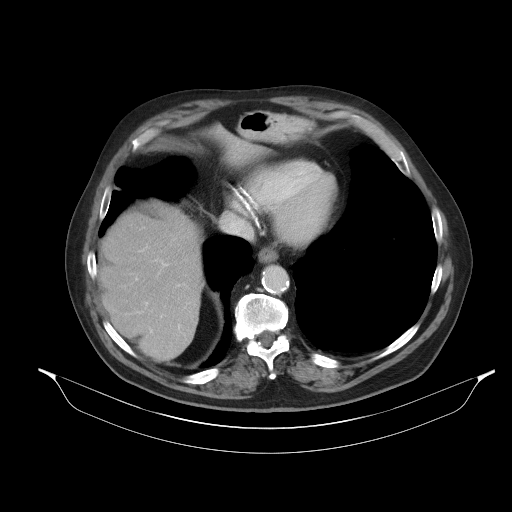
[im 104/128  soft-tissue]
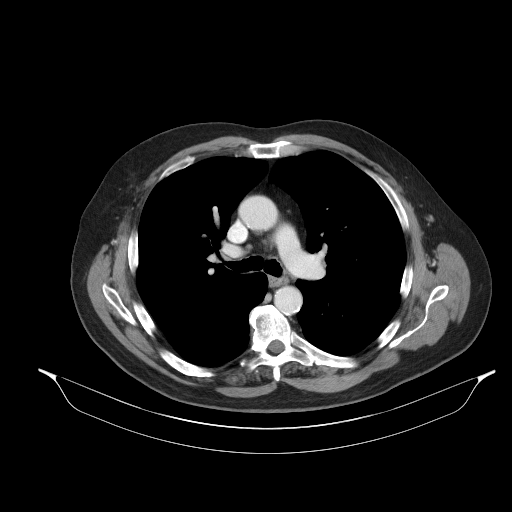
[im 116/128  soft-tissue]
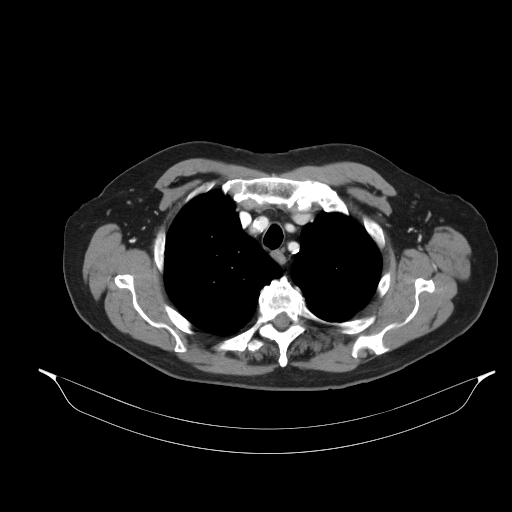

[Series 4: lung · axial · 0.94mm/px · z∈[-287,-161]mm · 4 of 159 slices shown]
[im 11/159  bone]
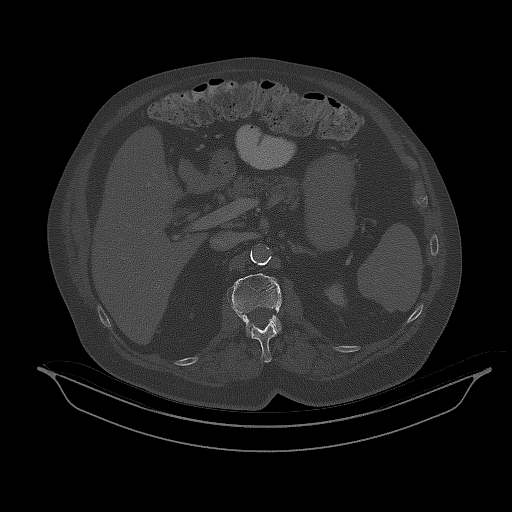
[im 32/159  bone]
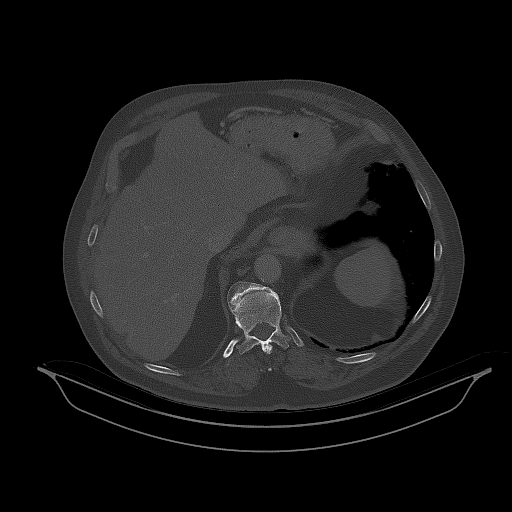
[im 53/159  bone]
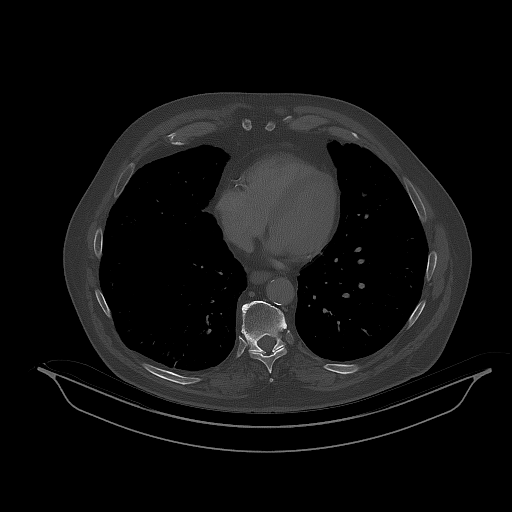
[im 74/159  bone]
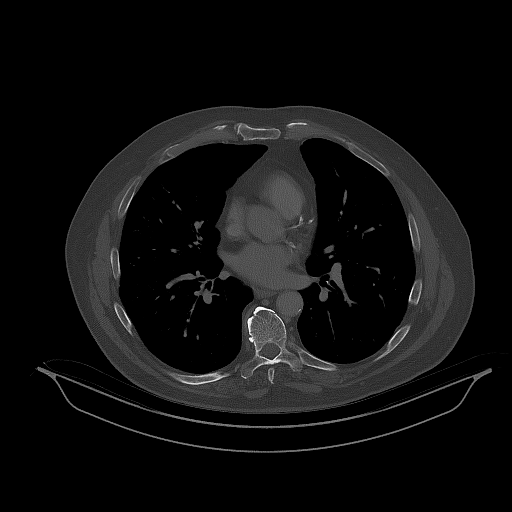

[Series 7: renal delay · axial · delayed · 0.94mm/px · z∈[-352,-292]mm · 2 of 38 slices shown, 5 images]
[im 13/38  soft-tissue]
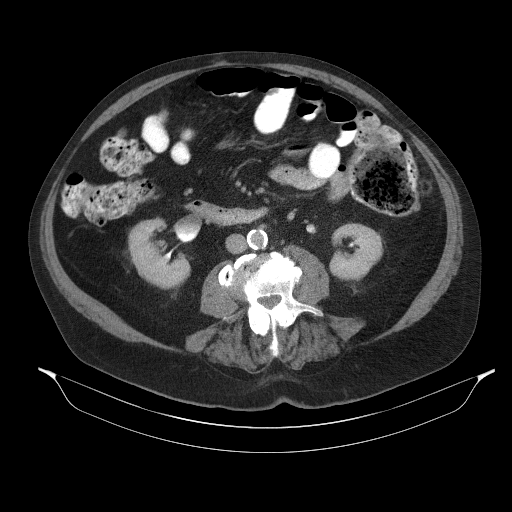
[im 13/38  lung]
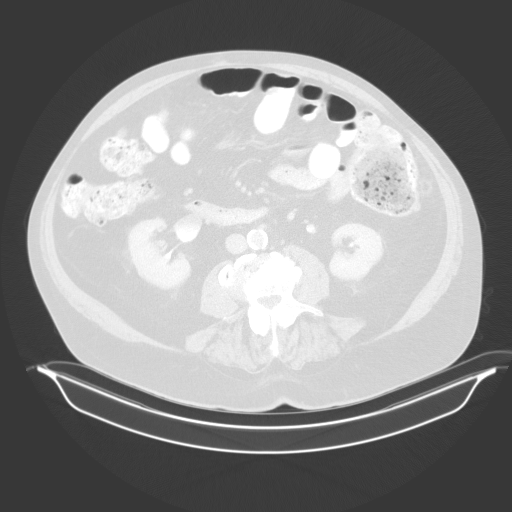
[im 13/38  bone]
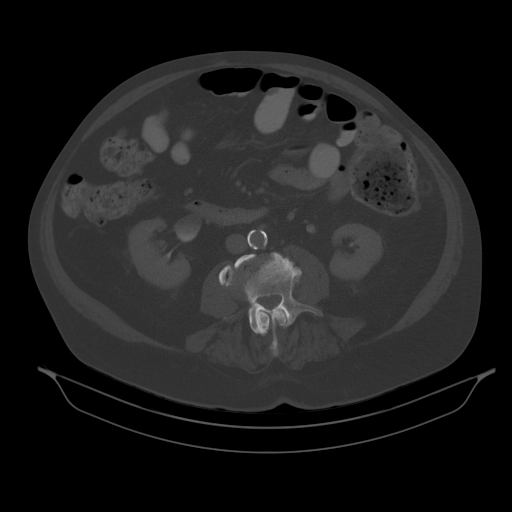
[im 25/38  soft-tissue]
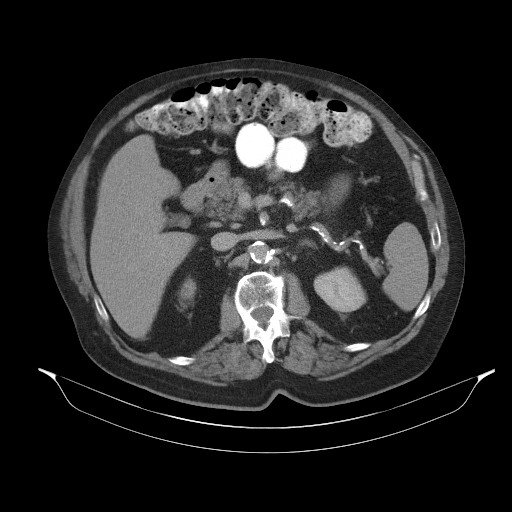
[im 25/38  lung]
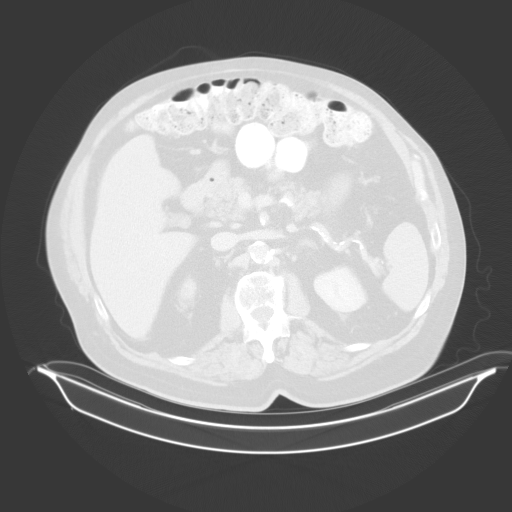

[14 of 32 positions shown; findings below may reference images not displayed]

FINDINGS: CT CHEST FINDINGS

Cardiovascular: Calcified and noncalcified atheromatous plaque in
the thoracic aorta. No aneurysmal dilation. Three-vessel coronary
artery disease. Heart size is normal without pericardial effusion.
Central pulmonary vasculature is unremarkable.

Mediastinum/Nodes: Thoracic inlet structures are normal. No axillary
lymphadenopathy. No mediastinal lymphadenopathy. No hilar
lymphadenopathy.

Lungs/Pleura: Basilar atelectasis. No consolidation. No pleural
effusion. Airways are patent. Pulmonary emphysema, centrilobular
predominant and mild-to-moderate worse at the lung apices.

Small nodule at the RIGHT lung apex (image 22 of series 4) 4 mm not
changed since [H5]

Grouped nodules in the posterior RIGHT upper lobe just above the
major fissure with branching configuration, largest approximately 4
mm (image 49, series 4)

Musculoskeletal: See below for full musculoskeletal details. No
chest wall mass.

CT ABDOMEN PELVIS FINDINGS

Hepatobiliary: Low-attenuation of hepatic parenchyma no
pericholecystic stranding. No biliary duct dilation. No focal,
suspicious hepatic lesion. Portal vein is patent.

Pancreas: Normal

Spleen: Normal

Adrenals/Urinary Tract: Adrenal glands are normal. Symmetric renal
enhancement. No hydronephrosis. No suspicious renal lesion. Small
low-density lesion is in the LEFT kidney are compatible with small
cysts.

Stomach/Bowel: No acute gastrointestinal process. Post LEFT partial
colectomy. Sigmoid diverticular disease.

Vascular/Lymphatic: Calcified atheromatous plaque in the abdominal
aorta. No aneurysmal dilation.

Enlarged lymph nodes seen along the LEFT periaortic chain on the
previous exam, largest now approximately 1 cm (image 70, series 2)
previously approximately 1.2 cm. No signs of new or enlarging lymph
nodes.

Reproductive: Mildly heterogeneous prostate, nonspecific finding
unchanged from previous imaging. Urinary bladder under distended. No
pelvic adenopathy.

Other: Small fat containing bilateral inguinal hernias LEFT slightly
greater than RIGHT.

Musculoskeletal: Spinal degenerative changes. No acute or
destructive bone process.
IMPRESSION: 1. Top-normal size of LEFT para-aortic lymph nodes which were
previously slightly above 1 cm. Attention on follow-up.
2. No definitive signs of metastatic disease to the chest, abdomen
or pelvis with postoperative changes of LEFT hemicolectomy.
3. New area of grouped nodularity in the RIGHT upper lobe
posteriorly just above the fissure may be infectious or inflammatory
given branching and tree-in-bud characteristics. Consider
three-month follow-up to ensure resolution.
4. Pulmonary emphysema, centrilobular predominant and
mild-to-moderate worse at the lung apices.
5. Three-vessel coronary artery disease.
6. Hepatic steatosis.
7. Sigmoid diverticular disease.
8. Small fat containing bilateral inguinal hernias LEFT slightly
greater than RIGHT.
9. Emphysema and aortic atherosclerosis.

Aortic Atherosclerosis ([H5]-[H5]) and Emphysema ([H5]-[H5]).

## 2020-04-14 MED ORDER — IOPAMIDOL (ISOVUE-300) INJECTION 61%
80.0000 mL | Freq: Once | INTRAVENOUS | Status: AC | PRN
  Administered 2020-04-14: 80 mL via INTRAVENOUS

## 2020-05-03 ENCOUNTER — Other Ambulatory Visit: Payer: Self-pay

## 2020-05-03 NOTE — Progress Notes (Signed)
The proposed treatment discussed in conference is for discussion purposes only and is not a binding recommendation.  The patients have not been physically examined, or presented with their treatment options.  Therefore, final treatment plans cannot be decided.   

## 2020-05-17 DIAGNOSIS — R220 Localized swelling, mass and lump, head: Secondary | ICD-10-CM | POA: Diagnosis not present

## 2020-05-19 ENCOUNTER — Ambulatory Visit: Payer: Medicare PPO | Admitting: Neurology

## 2020-06-05 DIAGNOSIS — D140 Benign neoplasm of middle ear, nasal cavity and accessory sinuses: Secondary | ICD-10-CM | POA: Diagnosis not present

## 2020-06-12 NOTE — Progress Notes (Signed)
Assessment/Plan:    1.  Essential Tremor  -Continue primidone, 50 mg twice per day  2.  Diplopia, likely the result of an ischemic event  -Now resolved  -Patient's MRI brain with white matter disease and moderate stenosis of the right MCA.  -MRA neck with 50% stenosis of the right carotid (note that carotid ultrasound had been estimating this much higher)  -Patient on Lipitor, 40 mg daily.  LDL was at goal in September, 2021 (54)  -Patient on aspirin, 81 mg daily.  3.  Peripheral neuropathy  -On gabapentin, 100 mg in the morning and 600 mg at night Subjective:   Glen [name] was seen today in follow up for essential tremor.  My previous records were reviewed prior to todays visit.  Tremor is generally good unless he has to do something where he has to increase force on the hands.   When last seen, patient was having diplopia, felt vascular in nature.  He had an MRA of the head that demonstrated moderate stenosis of the right MCA, but was otherwise unremarkable.  MRA of the neck demonstrated no ICA stenosis on the left and questionable 50% stenosis on the right.  States that wife put him on a diet last year and lost 20 lbs until xmas.    Current prescribed movement disorder medications: 50 mg twice per day (increased last visit)    ALLERGIES:   Allergies  Allergen Reactions  . Aspirin Other (See Comments)    pt has bleeding ulcers  . Lisinopril Cough  . Other     Other reaction(s): GI Upset (intolerance) AVOID NSAIDS    CURRENT MEDICATIONS:  Outpatient Encounter Medications as of 06/13/2020  Medication Sig  . acetaminophen (TYLENOL) 650 MG CR tablet Take 1,950 mg by mouth at bedtime.  . ALPRAZolam (XANAX) 0.25 MG tablet Take 0.25 mg by mouth daily as needed for anxiety.   [name] amLODipine (NORVASC) 10 MG tablet Take 10 mg by mouth every evening.   [name] aspirin EC 81 MG tablet Take 81 mg by mouth at bedtime.   . diphenhydrAMINE (BENADRYL) 25 MG tablet Take 75 mg by mouth at  bedtime.   . gabapentin (NEURONTIN) 100 MG capsule TAKE 1 CAPSULE BY MOUTH IN AM AND 1 CAPSULE BY MOUTH IN AFTERNOON (Patient taking differently: TAKE 1 CAPSULE BY MOUTH IN am)  . gabapentin (NEURONTIN) 300 MG capsule TAKE 2 CAPSULES(600 MG) BY MOUTH AT BEDTIME (Patient taking differently: Take 1 tab in the morning and 2 at bedtime)  . hydrochlorothiazide (MICROZIDE) 12.5 MG capsule Take 12.5 mg by mouth daily.   [name] levalbuterol (XOPENEX) 0.63 MG/3ML nebulizer solution USE 1 VIAL VIA NEBULIZER EVERY 4 HOURS AS NEEDED FOR WHEEZING OR SHORTNESS OF BREATH  . omeprazole (PRILOSEC) 20 MG capsule Take 20 mg by mouth daily.  . primidone (MYSOLINE) 50 MG tablet Take 1 tablet (50 mg total) by mouth in the morning and at bedtime.  [name] PROAIR HFA 108 (90 Base) MCG/ACT inhaler INHALE 2 PUFFS INTO THE LUNGS EVERY 4 HOURS AS NEEDED FOR WHEEZING OR SHORTNESS OF BREATH.  . sertraline (ZOLOFT) 100 MG tablet Take 100 mg by mouth daily.   [name] telmisartan (MICARDIS) 40 MG tablet Take 40 mg by mouth daily.  . TRELEGY ELLIPTA 100-62.5-25 MCG/INH AEPB INHALE 1 PUFF INTO THE LUNGS DAILY  . vitamin B-12 (CYANOCOBALAMIN) 1000 MCG tablet Take 1,000 mcg by mouth daily.  [name] atorvastatin (LIPITOR) 40 MG tablet Take 40 mg by mouth at bedtime.  No facility-administered encounter medications on file as of 06/13/2020.     Objective:    PHYSICAL EXAMINATION:    VITALS:   Vitals:   06/13/20 1420  BP: (!) 126/52  Pulse: 69  SpO2: 99%  Weight: 192 lb (87.1 kg)  Height: 5\' 7"  (1.702 m)   Wt Readings from Last 3 Encounters:  06/13/20 192 lb (87.1 kg)  04/05/20 190 lb (86.2 kg)  03/16/20 189 lb 12.8 oz (86.1 kg)     GEN:  The patient appears stated age and is in NAD. HEENT:  Normocephalic, atraumatic.  The mucous membranes are moist. The superficial temporal arteries are without ropiness or tenderness. CV:  RRR Lungs:  CTAB Neck/HEME:  There are no carotid bruits bilaterally.  Neurological examination:  Orientation:  The patient is alert and oriented x3. Cranial nerves: There is good facial symmetry. The speech is fluent and clear. Soft palate rises symmetrically and there is no tongue deviation. Hearing is intact to conversational tone. Sensation: Sensation is intact to light touch throughout Motor: Strength is at least antigravity x4.  Movement examination: Tone: There is normal tone in the UE/LE Abnormal movements: no rest or postural tremor.  Mild trouble with archimedes, L>R Coordination:  There is no decremation with RAM's Gait and Station: The patient has no difficulty arising out of a deep-seated chair without the use of the hands. The patient's stride length is good I have reviewed and interpreted the following labs independently   Chemistry      Component Value Date/Time   NA 141 07/25/2018 0330   K 3.3 (L) 07/25/2018 0330   CL 105 07/25/2018 0330   CO2 29 07/25/2018 0330   BUN <5 (L) 07/25/2018 0330   CREATININE 0.81 07/25/2018 0330      Component Value Date/Time   CALCIUM 8.1 (L) 07/25/2018 0330   ALKPHOS 85 07/17/2018 0929   AST 19 07/17/2018 0929   ALT 14 07/17/2018 0929   BILITOT 0.6 07/17/2018 0929      Lab Results  Component Value Date   WBC 5.4 07/25/2018   HGB 8.9 (L) 07/25/2018   HCT 27.7 (L) 07/25/2018   MCV 93.3 07/25/2018   PLT 245 07/25/2018   Lab Results  Component Value Date   TSH 1.48 11/20/2015     Chemistry      Component Value Date/Time   NA 141 07/25/2018 0330   K 3.3 (L) 07/25/2018 0330   CL 105 07/25/2018 0330   CO2 29 07/25/2018 0330   BUN <5 (L) 07/25/2018 0330   CREATININE 0.81 07/25/2018 0330      Component Value Date/Time   CALCIUM 8.1 (L) 07/25/2018 0330   ALKPHOS 85 07/17/2018 0929   AST 19 07/17/2018 0929   ALT 14 07/17/2018 0929   BILITOT 0.6 07/17/2018 0929         Total time spent on today's visit was 20 minutes, including both face-to-face time and nonface-to-face time.  Time included that spent on review of records  (prior notes available to me/labs/imaging if pertinent), discussing treatment and goals, answering patient's questions and coordinating care.  Cc:  [name], MD

## 2020-06-13 ENCOUNTER — Ambulatory Visit: Payer: Medicare PPO | Admitting: Neurology

## 2020-06-13 ENCOUNTER — Other Ambulatory Visit: Payer: Self-pay

## 2020-06-13 ENCOUNTER — Encounter: Payer: Self-pay | Admitting: Neurology

## 2020-06-13 VITALS — BP 126/52 | HR 69 | Ht 67.0 in | Wt 192.0 lb

## 2020-06-13 DIAGNOSIS — G250 Essential tremor: Secondary | ICD-10-CM

## 2020-06-26 ENCOUNTER — Other Ambulatory Visit: Payer: Self-pay | Admitting: Internal Medicine

## 2020-07-05 DIAGNOSIS — D140 Benign neoplasm of middle ear, nasal cavity and accessory sinuses: Secondary | ICD-10-CM | POA: Diagnosis not present

## 2020-07-05 DIAGNOSIS — J343 Hypertrophy of nasal turbinates: Secondary | ICD-10-CM | POA: Diagnosis not present

## 2020-07-05 DIAGNOSIS — J342 Deviated nasal septum: Secondary | ICD-10-CM | POA: Diagnosis not present

## 2020-07-11 DIAGNOSIS — Z85038 Personal history of other malignant neoplasm of large intestine: Secondary | ICD-10-CM | POA: Diagnosis not present

## 2020-07-11 DIAGNOSIS — Z86010 Personal history of colonic polyps: Secondary | ICD-10-CM | POA: Diagnosis not present

## 2020-07-11 DIAGNOSIS — K2270 Barrett's esophagus without dysplasia: Secondary | ICD-10-CM | POA: Diagnosis not present

## 2020-07-25 ENCOUNTER — Other Ambulatory Visit: Payer: Self-pay | Admitting: Neurology

## 2020-07-25 DIAGNOSIS — E7800 Pure hypercholesterolemia, unspecified: Secondary | ICD-10-CM | POA: Diagnosis not present

## 2020-07-25 DIAGNOSIS — Z79899 Other long term (current) drug therapy: Secondary | ICD-10-CM | POA: Diagnosis not present

## 2020-07-25 DIAGNOSIS — I10 Essential (primary) hypertension: Secondary | ICD-10-CM | POA: Diagnosis not present

## 2020-07-25 DIAGNOSIS — J449 Chronic obstructive pulmonary disease, unspecified: Secondary | ICD-10-CM | POA: Diagnosis not present

## 2020-07-25 DIAGNOSIS — R7309 Other abnormal glucose: Secondary | ICD-10-CM | POA: Diagnosis not present

## 2020-07-25 DIAGNOSIS — K219 Gastro-esophageal reflux disease without esophagitis: Secondary | ICD-10-CM | POA: Diagnosis not present

## 2020-07-25 DIAGNOSIS — Z85038 Personal history of other malignant neoplasm of large intestine: Secondary | ICD-10-CM | POA: Diagnosis not present

## 2020-07-25 DIAGNOSIS — F321 Major depressive disorder, single episode, moderate: Secondary | ICD-10-CM | POA: Diagnosis not present

## 2020-07-31 DIAGNOSIS — Z01812 Encounter for preprocedural laboratory examination: Secondary | ICD-10-CM | POA: Diagnosis not present

## 2020-08-03 ENCOUNTER — Other Ambulatory Visit: Payer: Self-pay | Admitting: Neurology

## 2020-08-03 DIAGNOSIS — K2950 Unspecified chronic gastritis without bleeding: Secondary | ICD-10-CM | POA: Diagnosis not present

## 2020-08-03 DIAGNOSIS — K2270 Barrett's esophagus without dysplasia: Secondary | ICD-10-CM | POA: Diagnosis not present

## 2020-08-03 DIAGNOSIS — Z980 Intestinal bypass and anastomosis status: Secondary | ICD-10-CM | POA: Diagnosis not present

## 2020-08-03 DIAGNOSIS — K449 Diaphragmatic hernia without obstruction or gangrene: Secondary | ICD-10-CM | POA: Diagnosis not present

## 2020-08-03 DIAGNOSIS — Z86010 Personal history of colonic polyps: Secondary | ICD-10-CM | POA: Diagnosis not present

## 2020-08-03 DIAGNOSIS — Z85038 Personal history of other malignant neoplasm of large intestine: Secondary | ICD-10-CM | POA: Diagnosis not present

## 2020-08-03 DIAGNOSIS — K5730 Diverticulosis of large intestine without perforation or abscess without bleeding: Secondary | ICD-10-CM | POA: Diagnosis not present

## 2020-08-03 NOTE — Telephone Encounter (Signed)
Rx(s) sent to pharmacy electronically.  

## 2020-08-04 DIAGNOSIS — J343 Hypertrophy of nasal turbinates: Secondary | ICD-10-CM | POA: Diagnosis not present

## 2020-08-04 DIAGNOSIS — J342 Deviated nasal septum: Secondary | ICD-10-CM | POA: Diagnosis not present

## 2020-08-04 DIAGNOSIS — D140 Benign neoplasm of middle ear, nasal cavity and accessory sinuses: Secondary | ICD-10-CM | POA: Diagnosis not present

## 2020-08-07 ENCOUNTER — Telehealth: Payer: Self-pay | Admitting: Oncology

## 2020-08-07 NOTE — Telephone Encounter (Signed)
Calendar & letter has been mailed to the patient with updated address for appointments at [facility]

## 2020-08-08 DIAGNOSIS — K2270 Barrett's esophagus without dysplasia: Secondary | ICD-10-CM | POA: Diagnosis not present

## 2020-08-28 DIAGNOSIS — H903 Sensorineural hearing loss, bilateral: Secondary | ICD-10-CM | POA: Diagnosis not present

## 2020-09-14 ENCOUNTER — Inpatient Hospital Stay: Payer: Medicare PPO | Attending: Oncology | Admitting: Oncology

## 2020-09-14 ENCOUNTER — Telehealth: Payer: Self-pay | Admitting: Neurology

## 2020-09-14 ENCOUNTER — Other Ambulatory Visit: Payer: Medicare PPO

## 2020-09-14 ENCOUNTER — Inpatient Hospital Stay: Payer: Medicare PPO

## 2020-09-14 ENCOUNTER — Other Ambulatory Visit: Payer: Self-pay

## 2020-09-14 VITALS — BP 132/60 | HR 70 | Temp 97.8°F | Resp 18 | Ht 67.0 in | Wt 192.6 lb

## 2020-09-14 DIAGNOSIS — J449 Chronic obstructive pulmonary disease, unspecified: Secondary | ICD-10-CM | POA: Diagnosis not present

## 2020-09-14 DIAGNOSIS — I10 Essential (primary) hypertension: Secondary | ICD-10-CM | POA: Insufficient documentation

## 2020-09-14 DIAGNOSIS — C186 Malignant neoplasm of descending colon: Secondary | ICD-10-CM | POA: Diagnosis not present

## 2020-09-14 DIAGNOSIS — G629 Polyneuropathy, unspecified: Secondary | ICD-10-CM | POA: Diagnosis not present

## 2020-09-14 DIAGNOSIS — Z85038 Personal history of other malignant neoplasm of large intestine: Secondary | ICD-10-CM | POA: Insufficient documentation

## 2020-09-14 DIAGNOSIS — E538 Deficiency of other specified B group vitamins: Secondary | ICD-10-CM | POA: Diagnosis not present

## 2020-09-14 LAB — CEA (ACCESS): CEA (CHCC): 2.04 ng/mL (ref 0.00–5.00)

## 2020-09-14 NOTE — Telephone Encounter (Signed)
If it is getting worse, he may need to be evaluated at [facility] or ER.

## 2020-09-14 NOTE — Telephone Encounter (Signed)
New message    Patient C/o woke up with ankles & lower calf are numb.  Oncology appt today @ 11am

## 2020-09-14 NOTE — Telephone Encounter (Signed)
Called patient and advised him that if symptoms are getting worse to seek care/evaluation at Urgent Care or Emergency Room. Patient verbalized understanding and had no further questions.

## 2020-09-14 NOTE — Telephone Encounter (Signed)
Called patient and he stated that the numbness is happening on both sides and that since this morning it has been getting worse. Patient stated that his toes get so numb that sometimes they are asleep. Patient has been taking 700mg  of Gabapentin daily.   Patient stated that the PN began after seeing Dr. Carles Collet.  Patient was advised that I would send a message to Dr. Carles Collet and call him once I hear back. Patient verbalized understanding.

## 2020-09-14 NOTE — Telephone Encounter (Signed)
Is it both sides?  He has always had PN.  How is this different than that?  He is on gabapentin for it.

## 2020-09-14 NOTE — Progress Notes (Signed)
[place] OFFICE PROGRESS NOTE   Diagnosis: Colon cancer  INTERVAL HISTORY:   Glen [name] returns as scheduled.  He reports increase in neuropathy symptoms in the legs.  He placed a call to Dr. Carles Collet today.  He has constipation, relieved with MiraLAX.  He has occasional "hemorrhoid "bleeding.  This is a chronic issue.  Good appetite.  No other complaint.  Objective:  Vital signs in last 24 hours:  Blood pressure 132/60, pulse 70, temperature 97.8 F (36.6 C), temperature source Oral, resp. rate 18, height 5' 7" (1.702 m), weight 192 lb 9.6 oz (87.4 kg), SpO2 97 %.   Lymphatics: No cervical, supraclavicular, axillary, or inguinal nodes Resp: End inspiratory coarse rhonchi at the posterior base bilaterally, no respiratory distress Cardio: Regular rate and rhythm GI: No hepatosplenomegaly, no mass, nontender Vascular: No leg edema    Lab Results:  Lab Results  Component Value Date   WBC 5.4 07/25/2018   HGB 8.9 (L) 07/25/2018   HCT 27.7 (L) 07/25/2018   MCV 93.3 07/25/2018   PLT 245 07/25/2018   NEUTROABS 5.3 07/17/2018    CMP  Lab Results  Component Value Date   NA 141 07/25/2018   K 3.3 (L) 07/25/2018   CL 105 07/25/2018   CO2 29 07/25/2018   GLUCOSE 107 (H) 07/25/2018   BUN <5 (L) 07/25/2018   CREATININE 0.81 07/25/2018   CALCIUM 8.1 (L) 07/25/2018   PROT 6.0 (L) 07/17/2018   ALBUMIN 3.4 (L) 07/17/2018   AST 19 07/17/2018   ALT 14 07/17/2018   ALKPHOS 85 07/17/2018   BILITOT 0.6 07/17/2018   GFRNONAA >60 07/25/2018   GFRAA >60 07/25/2018    Lab Results  Component Value Date   CEA1 1.46 03/16/2020    Medications: I have reviewed the patient's current medications.   Assessment/Plan: 1. Colon cancer   Colonoscopy by Dr. Oletta Lamas 06/22/2018-polypoid completely obstructing large mass was found in the proximal sigmoid colon.  Pathology showed fragments of ulcer and inflammatory debris; no viable tissue present.    CT abdomen/pelvis  06/30/2018-severe descending and sigmoid diverticulosis.  There was a focal dilatation of the mid descending colon to approximately 8.5 x 6.2 cm.  There was no overt exophytic mass or mesenteric lymphadenopathy.  No evidence of abdominal metastatic disease.    07/21/2018 open left colectomy with primary anastomosis, primary repair of umbilical hernia by Dr. Dalbert Batman.  Pathology showed invasive moderately differentiated adenocarcinoma with mucinous features measuring 7.5 cm.  There was circumferential involvement of the descending colon with luminal obstruction.  Carcinoma focally invaded into the pericolonic soft tissue.  Margins negative.  No lymphovascular or perineural invasion.  21 lymph nodes negative for cancer; loss of expression MLH1 and PMS2; MSI high.  MLH1 hyper methylation present  Colonoscopy 05/27/2019-polyps removed from the sigmoid, descending, cecum, ascending, and transverse colon, nodular mucosa at the colonic anastomosis biopsied-benign ulcerated mucosa, polyps were tubular adenomas, mucosal prolapse polyps,  and a hyperplastic polyp 2. Upper endoscopy 06/22/2018-esophageal mucosal changes suggestive of long segment Barrett's esophagus.  A few gastric polyps.  Normal examined duodenum.  GERD.  Stomach biopsy-fundic gland polyp.  Negative for dysplasia.  Esophagus biopsy-Barrett's mucosa.  Negative for dysplasia.   3. COPD 4. Hypertension 5. B12 deficiency 6. Peripheral neuropathy     Disposition: Glen [name] remains in clinical remission from colon cancer.  We will follow up on the CEA from today.  He continues follow-up with Dr. Watt Climes for colonoscopy surveillance.  He last had a  colonoscopy for removal of tubular adenomas in January 2021.  Glen [name] will return for an office visit and CEA in 6 months.  Betsy Coder, MD  09/14/2020  11:44 AM

## 2020-09-15 ENCOUNTER — Other Ambulatory Visit: Payer: Self-pay | Admitting: *Deleted

## 2020-09-15 DIAGNOSIS — I6523 Occlusion and stenosis of bilateral carotid arteries: Secondary | ICD-10-CM

## 2020-09-15 LAB — CEA (IN HOUSE-CHCC): CEA (CHCC-In House): 1.87 ng/mL (ref 0.00–5.00)

## 2020-09-18 ENCOUNTER — Telehealth: Payer: Self-pay | Admitting: *Deleted

## 2020-09-18 NOTE — Telephone Encounter (Signed)
-----   Message from Ladell Pier, MD sent at 09/15/2020 12:59 PM EDT ----- Please call patient, CEA is normal, follow-up as scheduled

## 2020-09-18 NOTE — Telephone Encounter (Signed)
Per Dr.Sherrill, called pt with message below. Pt verbalized understanding. Advised to call office with any concerns

## 2020-09-22 DIAGNOSIS — G4733 Obstructive sleep apnea (adult) (pediatric): Secondary | ICD-10-CM | POA: Diagnosis not present

## 2020-09-27 DIAGNOSIS — H52203 Unspecified astigmatism, bilateral: Secondary | ICD-10-CM | POA: Diagnosis not present

## 2020-09-27 DIAGNOSIS — Z961 Presence of intraocular lens: Secondary | ICD-10-CM | POA: Diagnosis not present

## 2020-09-27 DIAGNOSIS — H532 Diplopia: Secondary | ICD-10-CM | POA: Diagnosis not present

## 2020-10-04 ENCOUNTER — Other Ambulatory Visit: Payer: Self-pay

## 2020-10-04 ENCOUNTER — Ambulatory Visit: Payer: Medicare PPO | Admitting: Vascular Surgery

## 2020-10-04 ENCOUNTER — Encounter: Payer: Self-pay | Admitting: Vascular Surgery

## 2020-10-04 ENCOUNTER — Ambulatory Visit (HOSPITAL_COMMUNITY)
Admission: RE | Admit: 2020-10-04 | Discharge: 2020-10-04 | Disposition: A | Discharge status: Home / Self Care | Payer: Medicare PPO | Source: Ambulatory Visit | Attending: Vascular Surgery | Admitting: Vascular Surgery

## 2020-10-04 VITALS — BP 113/65 | HR 60 | Temp 98.3°F | Resp 20 | Ht 67.0 in | Wt 191.0 lb

## 2020-10-04 DIAGNOSIS — I6523 Occlusion and stenosis of bilateral carotid arteries: Secondary | ICD-10-CM | POA: Diagnosis not present

## 2020-10-04 DIAGNOSIS — I6521 Occlusion and stenosis of right carotid artery: Secondary | ICD-10-CM

## 2020-10-04 NOTE — Progress Notes (Signed)
REASON FOR VISIT:   Follow-up of right carotid stenosis  MEDICAL ISSUES:   ASYMPTOMATIC 60 TO 79% RIGHT CAROTID STENOSIS: This patient has an asymptomatic 60 to 79% right carotid stenosis with no significant stenosis on the left.  The velocities on the right have improved slightly compared to a study 6 months ago.  He is on aspirin and is on a statin.  He quit smoking several years ago.  We have discussed the importance of exercise and also the importance of nutrition.  I ordered a follow-up carotid duplex scan in 6 months and I will see him back at that time.  He understands we would not consider right carotid endarterectomy unless the stenosis progressed to greater than 80% or he develop new right hemispheric symptoms.   HPI:   Glen [name] is a pleasant 80 y.o. male who I last saw on 04/05/2020.  I been following him with an asymptomatic 60 to 79% right carotid stenosis.  He has no significant stenosis on the left.  I explained we would not consider carotid endarterectomy unless he developed new right hemispheric symptoms or the stenosis progressed to greater than 80%.  He was on aspirin and was on a statin.  He was set up for 29-month follow-up carotid duplex scan.  Since I saw him last, he denies any history of stroke, TIAs, expressive or receptive aphasia or amaurosis fugax.  He quit smoking several years ago.  He is on aspirin and is on a statin.  There have been no significant changes to his medical history.  Past Medical History:  Diagnosis Date  . Allergic rhinitis, cause unspecified   . Anxiety   . Asthma   . Cancer ([place]) 07/21/2018   colon  . COPD (chronic obstructive pulmonary disease) ([place])   . Depression   . Dyspnea    upon exertion  . GERD (gastroesophageal reflux disease)   . Hypertension   . PUD (peptic ulcer disease)    avoids aspirin  . Sleep apnea    uses C-PAP    Family History  Problem Relation Age of Onset  . Alcohol abuse Father         commited suicide  . Heart disease Father        had bypass  . Breast cancer Mother   . Prostate cancer Maternal Uncle        several     SOCIAL HISTORY: Social History   Tobacco Use  . Smoking status: Former Smoker    Packs/day: 0.50    Types: Cigarettes    Quit date: 04/12/2011    Years since quitting: 9.4  . Smokeless tobacco: Never Used  Substance Use Topics  . Alcohol use: Yes    Alcohol/week: 0.0 - 6.0 standard drinks    Comment: 1 liquor drink, 1 glass wine daily    Allergies  Allergen Reactions  . Aspirin Other (See Comments)    pt has bleeding ulcers  . Lisinopril Cough  . Other     Other reaction(s): GI Upset (intolerance) AVOID NSAIDS    Current Outpatient Medications  Medication Sig Dispense Refill  . acetaminophen (TYLENOL) 650 MG CR tablet Take 1,950 mg by mouth at bedtime.    . ALPRAZolam (XANAX) 0.25 MG tablet Take 0.25 mg by mouth daily as needed for anxiety.     [name] amLODipine (NORVASC) 10 MG tablet Take 10 mg by mouth every evening.     [name] aspirin EC 81 MG tablet Take 81  mg by mouth at bedtime.     [name] atorvastatin (LIPITOR) 40 MG tablet Take 40 mg by mouth at bedtime.     . diphenhydrAMINE (BENADRYL) 25 MG tablet Take 75 mg by mouth at bedtime.     . gabapentin (NEURONTIN) 100 MG capsule TAKE 1 CAPSULE BY MOUTH IN AM AND 1 CAPSULE BY MOUTH IN AFTERNOON (Patient taking differently: TAKE 1 CAPSULE BY MOUTH IN am) 90 capsule 1  . gabapentin (NEURONTIN) 300 MG capsule TAKE 2 CAPSULES(600 MG) BY MOUTH AT BEDTIME 180 capsule 1  . hydrochlorothiazide (MICROZIDE) 12.5 MG capsule Take 12.5 mg by mouth daily.     [name] levalbuterol (XOPENEX) 0.63 MG/3ML nebulizer solution USE 1 VIAL VIA NEBULIZER EVERY 4 HOURS AS NEEDED FOR WHEEZING OR SHORTNESS OF BREATH 1650 mL 1  . omeprazole (PRILOSEC) 20 MG capsule Take 20 mg by mouth daily.    . primidone (MYSOLINE) 50 MG tablet TAKE 1 TABLET(50 MG) BY MOUTH IN THE MORNING AND AT BEDTIME 180 tablet 0  . PROAIR HFA 108 (90 Base)  MCG/ACT inhaler INHALE 2 PUFFS INTO THE LUNGS EVERY 4 HOURS AS NEEDED FOR WHEEZING OR SHORTNESS OF BREATH. 8.5 g 0  . sertraline (ZOLOFT) 100 MG tablet Take 100 mg by mouth daily.   4  . telmisartan (MICARDIS) 40 MG tablet Take 40 mg by mouth daily.    . TRELEGY ELLIPTA 100-62.5-25 MCG/INH AEPB INHALE 1 PUFF INTO THE LUNGS DAILY 180 each 1  . vitamin B-12 (CYANOCOBALAMIN) 1000 MCG tablet Take 1,000 mcg by mouth daily.     No current facility-administered medications for this visit.    REVIEW OF SYSTEMS:  [X]  denotes positive finding, [ ]  denotes negative finding Cardiac  Comments:  Chest pain or chest pressure:    Shortness of breath upon exertion: x   Short of breath when lying flat:    Irregular heart rhythm:        Vascular    Pain in calf, thigh, or hip brought on by ambulation:    Pain in feet at night that wakes you up from your sleep:     Blood clot in your veins:    Leg swelling:         Pulmonary    Oxygen at home:    Productive cough:  x   Wheezing:  x       Neurologic    Sudden weakness in arms or legs:     Sudden numbness in arms or legs:     Sudden onset of difficulty speaking or slurred speech:    Temporary loss of vision in one eye:     Problems with dizziness:         Gastrointestinal    Blood in stool:     Vomited blood:         Genitourinary    Burning when urinating:     Blood in urine:        Psychiatric    Major depression:         Hematologic    Bleeding problems:    Problems with blood clotting too easily:        Skin    Rashes or ulcers:        Constitutional    Fever or chills:     PHYSICAL EXAM:   Vitals:   10/04/20 1250 10/04/20 1252  BP: 110/68 113/65  Pulse: 60   Resp: 20   Temp: 98.3 F (36.8 C)   SpO2:  94%   Weight: 191 lb (86.6 kg)   Height: 5\' 7"  (1.702 m)    Body mass index is 29.91 kg/m.  GENERAL: The patient is a well-nourished male, in no acute distress. The vital signs are documented above. CARDIAC: There  is a regular rate and rhythm.  VASCULAR: I do not detect carotid bruits. He has palpable femoral pulses. On the right side he has a monophasic dorsalis pedis signal with a biphasic posterior tibial signal. On the left side he has a biphasic dorsalis pedis and posterior tibial signal. PULMONARY: There is good air exchange bilaterally without wheezing or rales. ABDOMEN: Soft and non-tender with normal pitched bowel sounds.  I do not palpate an abdominal aortic aneurysm although it is difficult to assess because of his body habitus. MUSCULOSKELETAL: There are no major deformities or cyanosis. NEUROLOGIC: No focal weakness or paresthesias are detected. SKIN: There are no ulcers or rashes noted. PSYCHIATRIC: The patient has a normal affect.  DATA:    CAROTID DUPLEX: I have independently interpreted his carotid duplex scan today.  On the right side there is a 60 to 79% carotid stenosis.  I did compare his velocities from his study back in November and the velocities are slightly improved on the right.  The right vertebral artery is patent with antegrade flow.  On the left side there is a less than 39% stenosis.  The left vertebral artery is patent with antegrade flow.    Deitra Mayo [facility] [phone]

## 2020-10-06 ENCOUNTER — Other Ambulatory Visit: Payer: Self-pay

## 2020-10-06 DIAGNOSIS — I6523 Occlusion and stenosis of bilateral carotid arteries: Secondary | ICD-10-CM

## 2020-10-22 ENCOUNTER — Other Ambulatory Visit: Payer: Self-pay | Admitting: Neurology

## 2020-10-23 NOTE — Telephone Encounter (Signed)
Next appt 12/12/2020

## 2020-12-01 DIAGNOSIS — R229 Localized swelling, mass and lump, unspecified: Secondary | ICD-10-CM | POA: Diagnosis not present

## 2020-12-07 ENCOUNTER — Other Ambulatory Visit: Payer: Self-pay | Admitting: Neurology

## 2020-12-11 NOTE — Progress Notes (Signed)
Assessment/Plan:    1.  Essential Tremor  -Continue primidone, 50 mg twice per day  2.  Diplopia, likely the result of an ischemic event, September, 2021  -Now resolved  -Patient's MRI brain with white matter disease and moderate stenosis of the right MCA.  -MRA neck with 50% stenosis of the right carotid (note that carotid ultrasound had been estimating this much higher)  -Patient on Lipitor, 40 mg daily.  LDL was at goal at the time of the event  -Patient on aspirin, 81 mg daily.  3.  Peripheral neuropathy  -On gabapentin, 100 mg in the morning and 600 mg at night.  Was refilled today.  4.  Carotid stenosis, right  -Followed by vascular surgery.  He had a recent carotid ultrasound with 60 to 79% right carotid stenosis (again, the MRA did not show this as high)  5.  B12 deficiency  -on oral supplementation  6.  F/u 1 year Subjective:   Glen [name] was seen today in follow up for essential tremor.  My previous records were reviewed prior to todays visit.  Tremor is generally well controlled except with anxiety or physical fatigue.  Patient did call in May stating that he woke up Wednesday with his ankles and lower calves now.  I had my staff call back and find out how that was different than his usual peripheral neuropathy.  He was already on gabapentin for paresthesias associated with peripheral neuropathy.  He ended up stating that it was just worse that particular morning.  It was still bilateral.  He states that they are "still numb."   He has been followed by vascular surgery for carotid stenosis.  He last saw Dr. Doren Custard on May 25.  Current prescribed movement disorder medications: 50 mg twice per day  Gabapentin, 100 mg in the morning, 600 mg at night   ALLERGIES:   Allergies  Allergen Reactions   Aspirin Other (See Comments)    pt has bleeding ulcers   Lisinopril Cough   Other     Other reaction(s): GI Upset (intolerance) AVOID NSAIDS    CURRENT  MEDICATIONS:  Outpatient Encounter Medications as of 12/12/2020  Medication Sig   acetaminophen (TYLENOL) 650 MG CR tablet Take 1,950 mg by mouth at bedtime.   ALPRAZolam (XANAX) 0.25 MG tablet Take 0.25 mg by mouth daily as needed for anxiety.    amLODipine (NORVASC) 10 MG tablet Take 10 mg by mouth every evening.    aspirin EC 81 MG tablet Take 81 mg by mouth at bedtime.    atorvastatin (LIPITOR) 40 MG tablet Take 40 mg by mouth at bedtime.    diphenhydrAMINE (BENADRYL) 25 MG tablet Take 75 mg by mouth at bedtime.    fluticasone (FLONASE) 50 MCG/ACT nasal spray Place into both nostrils daily. As needed   gabapentin (NEURONTIN) 100 MG capsule TAKE 1 CAPSULE BY MOUTH IN AM AND 1 CAPSULE BY MOUTH IN AFTERNOON (Patient taking differently: TAKE 1 CAPSULE BY MOUTH IN am)   hydrochlorothiazide (MICROZIDE) 12.5 MG capsule Take 12.5 mg by mouth daily.    omeprazole (PRILOSEC) 20 MG capsule Take 20 mg by mouth daily.   PROAIR HFA 108 (90 Base) MCG/ACT inhaler INHALE 2 PUFFS INTO THE LUNGS EVERY 4 HOURS AS NEEDED FOR WHEEZING OR SHORTNESS OF BREATH.   sertraline (ZOLOFT) 100 MG tablet Take 100 mg by mouth daily.    telmisartan (MICARDIS) 40 MG tablet Take 40 mg by mouth daily.   TRELEGY ELLIPTA  100-62.5-25 MCG/INH AEPB INHALE 1 PUFF INTO THE LUNGS DAILY   vitamin B-12 (CYANOCOBALAMIN) 1000 MCG tablet Take 1,000 mcg by mouth daily.   [DISCONTINUED] gabapentin (NEURONTIN) 300 MG capsule TAKE 2 CAPSULES(600 MG) BY MOUTH AT BEDTIME   [DISCONTINUED] primidone (MYSOLINE) 50 MG tablet TAKE 1 TABLET(50 MG) BY MOUTH IN THE MORNING AND AT BEDTIME   gabapentin (NEURONTIN) 300 MG capsule Take 2 capsules (600 mg total) by mouth at bedtime.   levalbuterol (XOPENEX) 0.63 MG/3ML nebulizer solution USE 1 VIAL VIA NEBULIZER EVERY 4 HOURS AS NEEDED FOR WHEEZING OR SHORTNESS OF BREATH (Patient not taking: Reported on 12/12/2020)   primidone (MYSOLINE) 50 MG tablet Take 1 tablet (50 mg total) by mouth 2 (two) times daily.    No facility-administered encounter medications on file as of 12/12/2020.     Objective:    PHYSICAL EXAMINATION:    VITALS:   Vitals:   12/12/20 1040  BP: (!) 114/56  Pulse: 70  SpO2: 96%  Weight: 194 lb 9.6 oz (88.3 kg)  Height: 5\' 7"  (1.702 m)    Wt Readings from Last 3 Encounters:  12/12/20 194 lb 9.6 oz (88.3 kg)  10/04/20 191 lb (86.6 kg)  09/14/20 192 lb 9.6 oz (87.4 kg)     GEN:  The patient appears stated age and is in NAD. HEENT:  Normocephalic, atraumatic.  The mucous membranes are moist. The superficial temporal arteries are without ropiness or tenderness. CV:  RRR Lungs:  CTAB Neck/HEME:  There are no carotid bruits bilaterally.  Neurological examination:  Orientation: The patient is alert and oriented x3. Cranial nerves: There is good facial symmetry. The speech is fluent and clear. Soft palate rises symmetrically and there is no tongue deviation. Hearing is intact to conversational tone. Sensation: Sensation is intact to light touch throughout Motor: Strength is at least antigravity x4.  Movement examination: Tone: There is normal tone in the UE/LE Abnormal movements: no rest or postural tremor.  Mild trouble with archimedes, L>R (maybe better than previous but definitely not worse) Coordination:  There is no decremation with RAM's Gait and Station: The patient has no difficulty arising out of a deep-seated chair without the use of the hands.  I have reviewed and interpreted the following labs independently   Chemistry      Component Value Date/Time   NA 141 07/25/2018 0330   K 3.3 (L) 07/25/2018 0330   CL 105 07/25/2018 0330   CO2 29 07/25/2018 0330   BUN <5 (L) 07/25/2018 0330   CREATININE 0.81 07/25/2018 0330      Component Value Date/Time   CALCIUM 8.1 (L) 07/25/2018 0330   ALKPHOS 85 07/17/2018 0929   AST 19 07/17/2018 0929   ALT 14 07/17/2018 0929   BILITOT 0.6 07/17/2018 0929      Lab Results  Component Value Date   WBC 5.4  07/25/2018   HGB 8.9 (L) 07/25/2018   HCT 27.7 (L) 07/25/2018   MCV 93.3 07/25/2018   PLT 245 07/25/2018   Lab Results  Component Value Date   TSH 1.48 11/20/2015     Chemistry      Component Value Date/Time   NA 141 07/25/2018 0330   K 3.3 (L) 07/25/2018 0330   CL 105 07/25/2018 0330   CO2 29 07/25/2018 0330   BUN <5 (L) 07/25/2018 0330   CREATININE 0.81 07/25/2018 0330      Component Value Date/Time   CALCIUM 8.1 (L) 07/25/2018 0330   ALKPHOS 85 07/17/2018 0929  AST 19 07/17/2018 0929   ALT 14 07/17/2018 0929   BILITOT 0.6 07/17/2018 0929     Lab Results  Component Value Date   VITAMINB12 244 04/03/2018       Total time spent on today's visit was 20 minutes, including both face-to-face time and nonface-to-face time.  Time included that spent on review of records (prior notes available to me/labs/imaging if pertinent), discussing treatment and goals, answering patient's questions and coordinating care.  Cc:  [name], MD

## 2020-12-12 ENCOUNTER — Other Ambulatory Visit: Payer: Self-pay

## 2020-12-12 ENCOUNTER — Ambulatory Visit: Payer: Medicare PPO | Admitting: Neurology

## 2020-12-12 ENCOUNTER — Encounter: Payer: Self-pay | Admitting: Neurology

## 2020-12-12 VITALS — BP 114/56 | HR 70 | Ht 67.0 in | Wt 194.6 lb

## 2020-12-12 DIAGNOSIS — G250 Essential tremor: Secondary | ICD-10-CM | POA: Diagnosis not present

## 2020-12-12 MED ORDER — PRIMIDONE 50 MG PO TABS
50.0000 mg | ORAL_TABLET | Freq: Two times a day (BID) | ORAL | 1 refills | Status: DC
Start: 2020-12-12 — End: 2021-06-04

## 2020-12-12 MED ORDER — GABAPENTIN 300 MG PO CAPS
600.0000 mg | ORAL_CAPSULE | Freq: Every day | ORAL | 1 refills | Status: DC
Start: 2020-12-12 — End: 2021-01-29

## 2020-12-12 NOTE — Patient Instructions (Signed)
Essential Tremor A tremor is trembling or shaking that a person cannot control. Most tremors affect the hands or arms. Tremors can also affect the head, vocal cords, legs, and other parts of the body. Essential tremor is a tremor without a known cause. Usually, it occurs while a person is trying to perform an action. Ittends to get worse gradually as a person ages. What are the causes? The cause of this condition is not known. What increases the risk? You are more likely to develop this condition if: You have a family member with essential tremor. You are age 40 or older. You take certain medicines. What are the signs or symptoms? The main sign of a tremor is a rhythmic shaking of certain parts of your body that is uncontrolled and unintentional. You may: Have difficulty eating with a spoon or fork. Have difficulty writing. Nod your head up and down or side to side. Have a quivering voice. The shaking may: Get worse over time. Come and go. Be more noticeable on one side of your body. Get worse due to stress, fatigue, caffeine, and extreme heat or cold. How is this diagnosed? This condition may be diagnosed based on: Your symptoms and medical history. A physical exam. There is no single test to diagnose an essential tremor. However, your health care provider may order tests to rule out other causes of your condition. These may include: Blood and urine tests. Imaging studies of your brain, such as CT scan and MRI. A test that measures involuntary muscle movement (electromyogram). How is this treated? Treatment for essential tremor depends on the severity of the condition. Some tremors may go away without treatment. Mild tremors may not need treatment if they do not affect your day-to-day life. Severe tremors may need to be treated using one or more of the following options: Medicines. Lifestyle changes. Occupational or physical therapy. Follow these instructions at  home: Lifestyle  Do not use any products that contain nicotine or tobacco, such as cigarettes and e-cigarettes. If you need help quitting, ask your health care provider. Limit your caffeine intake as told by your health care provider. Try to get 8 hours of sleep each night. Find ways to manage your stress that fits your lifestyle and personality. Consider trying meditation or yoga. Try to anticipate stressful situations and allow extra time to manage them. If you are struggling emotionally with the effects of your tremor, consider working with a mental health provider.  General instructions Take over-the-counter and prescription medicines only as told by your health care provider. Avoid extreme heat and extreme cold. Keep all follow-up visits as told by your health care provider. This is important. Visits may include physical therapy visits. Contact a health care provider if: You experience any changes in the location or intensity of your tremors. You start having a tremor after starting a new medicine. You have tremor with other symptoms, such as: Numbness. Tingling. Pain. Weakness. Your tremor gets worse. Your tremor interferes with your daily life. You feel down, blue, or sad for at least 2 weeks in a row. Worrying about your tremor and what other people think about you interferes with your everyday life functions, including relationships, work, or school. Summary Essential tremor is a tremor without a known cause. Usually, it occurs when you are trying to perform an action. You are more likely to develop this condition if you have a family member with essential tremor. The main sign of a tremor is a rhythmic shaking of   certain parts of your body that is uncontrolled and unintentional. Treatment for essential tremor depends on the severity of the condition. This information is not intended to replace advice given to you by your health care provider. Make sure you discuss any  questions you have with your healthcare provider. Document Revised: 01/21/2020 Document Reviewed: 01/21/2020 Elsevier Patient Education  2022 [employer]  

## 2020-12-26 ENCOUNTER — Other Ambulatory Visit: Payer: Self-pay | Admitting: Internal Medicine

## 2020-12-28 DIAGNOSIS — G4733 Obstructive sleep apnea (adult) (pediatric): Secondary | ICD-10-CM | POA: Diagnosis not present

## 2021-01-01 ENCOUNTER — Telehealth: Payer: Self-pay | Admitting: Internal Medicine

## 2021-01-01 ENCOUNTER — Telehealth: Payer: Self-pay

## 2021-01-01 DIAGNOSIS — G4733 Obstructive sleep apnea (adult) (pediatric): Secondary | ICD-10-CM | POA: Diagnosis not present

## 2021-01-01 MED ORDER — ALBUTEROL SULFATE HFA 108 (90 BASE) MCG/ACT IN AERS: 2.0000 | INHALATION_SPRAY | RESPIRATORY_TRACT | 1 refills | Status: DC | PRN

## 2021-01-01 NOTE — Telephone Encounter (Signed)
Created in error

## 2021-01-01 NOTE — Telephone Encounter (Signed)
Patient has yearly appointment in September with [facility] . Albuterol sent in x1.  Explained to patient to make sure they refilled a years worth when he came to his visit.   Patient voiced understanding Nothing further needed at this time.

## 2021-01-05 ENCOUNTER — Telehealth: Payer: Self-pay | Admitting: Neurology

## 2021-01-05 ENCOUNTER — Other Ambulatory Visit: Payer: Self-pay

## 2021-01-05 MED ORDER — GABAPENTIN 100 MG PO CAPS
100.0000 mg | ORAL_CAPSULE | Freq: Every day | ORAL | 1 refills | Status: DC
Start: 2021-01-05 — End: 2021-06-26

## 2021-01-05 NOTE — Telephone Encounter (Signed)
Refill for 100mg  gabapentin sent in for pt to pharmacy

## 2021-01-05 NOTE — Telephone Encounter (Signed)
Refill sent.

## 2021-01-05 NOTE — Telephone Encounter (Signed)
1. Which medications need to be refilled? (please list name of each medication and dose if known) Gabapentin 100mg   2. Which pharmacy/location (including street and city if local pharmacy) is medication to be sent to? [facility] [address]

## 2021-01-22 ENCOUNTER — Encounter: Payer: Self-pay | Admitting: Internal Medicine

## 2021-01-23 NOTE — Progress Notes (Signed)
Subjective:    Patient ID: Glen [name], male    DOB: 1940/05/27, 80 y.o.   MRN: [id number]  HPI male former smoker followed for COPD,OSA,  rhinitis, history urticaria, complicated by HBP [id number] obstructive airways disease with response to bronchodilator, air trapping, normal diffusion. FEV1/FVC 0.52, TLC 93%, DLCO 87 Walk Test 06/07/2016-Baseline 94% desaturated only to 92% on room air CT chest 12/22/2015-1.2 cm right upper lobe groundglass pulmonary nodule-----Resolved CT 06/15/16 ONOX room air 07/02/16- recorded 3 minutes with saturation less than or equal to 88%. HST 12/17/2016-AHI 7.9/hour, desaturation to 81%, body weight 210 pounds Walk Test 09/08/2017-3 laps/ 555 feet, lowest saturation 90%. ------------------------------------------------------------------   01/25/20- 80 year old male former smoker followed for COPD, lung nodule,OSA,  Rhinitis, history urticaria, complicated by HBP, Colon cancer resected.  CPAP auto 5-20/ [facility] Download compliance 87%, AHI 1.4/ hr Body weight today 189 lbs Covid vax- 2 Phizer Flu vax- already done Trelegy 100, Proair hfa, neb xop -----yearly follow up--doing good with the cpap. discuss battery operated cpap machine to use when camping  He plays banjo in musical gatherings, often w/o electricity source over night. Feels much better since colon cancer treated.  CXR 09/15/18-  IMPRESSION: No active cardiopulmonary disease. Hyperinflation with emphysematous disease.  01/24/21- 80 year old male former smoker followed for COPD, lung nodule,OSA,  Rhinitis, history urticaria, complicated by HBP, Colon cancer resected, GERD/ Barrett's,  -Neb Xop 0.63, Trelegy 100, albuterol hfa CPAP auto 5-20/ [facility]       AirSense 10 AutoSet Download compliance 87%, AHI 0.8/hr Body weight today  Covid vax- 2 Phizer        [facility] tomorrow -----42yr f/u for COPD and OSA. States his current mask is causing him to have sore, sensitive areas on his  face.  FFM irritates face some.  Download reviewed. Breathing stable and ok except on hot, humid days. No acute. Infrequent need for rescue inhaler or neb solution. Occasional intervals of cough/ white phlegm. No blood.  CT chest 04/17/20-  IMPRESSION: 1. Top-normal size of LEFT para-aortic lymph nodes which were previously slightly above 1 cm. Attention on follow-up. 2. No definitive signs of metastatic disease to the chest, abdomen or pelvis with postoperative changes of LEFT hemicolectomy. 3. New area of grouped nodularity in the RIGHT upper lobe posteriorly just above the fissure may be infectious or inflammatory given branching and tree-in-bud characteristics. Consider three-month follow-up to ensure resolution. 4. Pulmonary emphysema, centrilobular predominant and mild-to-moderate worse at the lung apices. 5. Three-vessel coronary artery disease. 6. Hepatic steatosis. 7. Sigmoid diverticular disease. 8. Small fat containing bilateral inguinal hernias LEFT slightly greater than RIGHT. 9. Emphysema and aortic atherosclerosis. Aortic Atherosclerosis (ICD10-I70.0) and Emphysema (ICD10-J43.9).  ROS-see HPI  + = positive Constitutional:   No-   weight loss, night sweats, fevers, chills, +fatigue, lassitude. HEENT:   No-  headaches, difficulty swallowing, tooth/dental problems, sore throat,       No-  sneezing, itching, ear ache, nasal congestion, post nasal drip,  CV:  No-   chest pain, orthopnea, PND, swelling in lower extremities, anasarca, dizziness, palpitations Resp: +  shortness of breath with exertion or at rest.         + productive cough,  + non-productive cough,  No- coughing up of blood.              No-   change in color of mucus.  No- wheezing.   Skin: No-   rash or lesions. GI:  No-   heartburn, indigestion,  abdominal pain, nausea, vomiting,  GU: . MS:  No-   joint pain or swelling.  . Neuro-     nothing unusual Psych:  No- change in mood or affect. + depression or  anxiety.  No memory loss.  OBJ- Physical Exam General- Alert, Oriented, Affect-appropriate, Distress- none acute, + obese Skin- rash-none, lesions- none, excoriation- none Lymphadenopathy- none Head- atraumatic            Eyes- Gross vision intact, PERRLA, conjunctivae and secretions clear            Ears- Hearing, canals-normal            Nose- Clear, no-Septal dev, mucus, polyps, erosion, perforation             Throat- Mallampati II , mucosa clear , drainage- none, tonsils- atrophic Neck- flexible , trachea midline, no stridor , thyroid nl, carotid no bruit Chest - symmetrical excursion , unlabored           Heart/CV- RRR , no murmur , no gallop  , no rub, nl s1 s2                           - JVD- none , edema- none, stasis changes- none, varices- none           Lung- +quiet, distant, wheeze- none, cough- none , dullness-none, rub- none           Chest wall-  Abd-  Br/ Gen/ Rectal- Not done, not indicated Extrem- cyanosis- none, clubbing, none, atrophy- none, strength- nl Neuro- grossly intact to observation Assessment & Plan:

## 2021-01-24 ENCOUNTER — Encounter: Payer: Self-pay | Admitting: Internal Medicine

## 2021-01-24 ENCOUNTER — Ambulatory Visit: Payer: Medicare PPO | Admitting: Internal Medicine

## 2021-01-24 ENCOUNTER — Other Ambulatory Visit: Payer: Self-pay

## 2021-01-24 VITALS — BP 112/68 | HR 67 | Ht 67.0 in | Wt 192.6 lb

## 2021-01-24 DIAGNOSIS — J449 Chronic obstructive pulmonary disease, unspecified: Secondary | ICD-10-CM | POA: Diagnosis not present

## 2021-01-24 DIAGNOSIS — R918 Other nonspecific abnormal finding of lung field: Secondary | ICD-10-CM | POA: Diagnosis not present

## 2021-01-24 DIAGNOSIS — G4733 Obstructive sleep apnea (adult) (pediatric): Secondary | ICD-10-CM | POA: Diagnosis not present

## 2021-01-24 MED ORDER — ALBUTEROL SULFATE HFA 108 (90 BASE) MCG/ACT IN AERS
INHALATION_SPRAY | RESPIRATORY_TRACT | 12 refills | Status: DC
Start: 2021-01-24 — End: 2022-12-03

## 2021-01-24 NOTE — Patient Instructions (Signed)
We have refilled albuterol rescue inhaler  I think you can use your current levalbuterol nebulizer solution for another 6 months as needed. If you do happen to use it and don't feel it is working, we can replace it then.  Order  CT chest, High Resolution, no contrast   dx RUL nodules  You can talk with Dr Dorthy Cooler about the atherosclerosis with calcium deposits in your aorta and heart, seen on CT scan.   Order- DME [facility]-  please replace old CPAP machine if eligible, auto 5-20, mask of choice, humidifier, supplies, AirView/ card.           Also - now please change mask to try nasal mask with chin strap

## 2021-01-25 DIAGNOSIS — K2270 Barrett's esophagus without dysplasia: Secondary | ICD-10-CM | POA: Diagnosis not present

## 2021-01-25 DIAGNOSIS — I10 Essential (primary) hypertension: Secondary | ICD-10-CM | POA: Diagnosis not present

## 2021-01-25 DIAGNOSIS — K2950 Unspecified chronic gastritis without bleeding: Secondary | ICD-10-CM | POA: Diagnosis not present

## 2021-01-25 DIAGNOSIS — R7303 Prediabetes: Secondary | ICD-10-CM | POA: Diagnosis not present

## 2021-01-25 DIAGNOSIS — J449 Chronic obstructive pulmonary disease, unspecified: Secondary | ICD-10-CM | POA: Diagnosis not present

## 2021-01-25 DIAGNOSIS — Z0001 Encounter for general adult medical examination with abnormal findings: Secondary | ICD-10-CM | POA: Diagnosis not present

## 2021-01-25 DIAGNOSIS — Z23 Encounter for immunization: Secondary | ICD-10-CM | POA: Diagnosis not present

## 2021-01-25 DIAGNOSIS — F321 Major depressive disorder, single episode, moderate: Secondary | ICD-10-CM | POA: Diagnosis not present

## 2021-01-25 DIAGNOSIS — E7800 Pure hypercholesterolemia, unspecified: Secondary | ICD-10-CM | POA: Diagnosis not present

## 2021-01-25 DIAGNOSIS — Z79899 Other long term (current) drug therapy: Secondary | ICD-10-CM | POA: Diagnosis not present

## 2021-01-28 ENCOUNTER — Other Ambulatory Visit: Payer: Self-pay | Admitting: Neurology

## 2021-01-28 DIAGNOSIS — G250 Essential tremor: Secondary | ICD-10-CM

## 2021-01-29 ENCOUNTER — Other Ambulatory Visit: Payer: Self-pay

## 2021-01-30 ENCOUNTER — Encounter: Payer: Self-pay | Admitting: Internal Medicine

## 2021-01-30 NOTE — Assessment & Plan Note (Signed)
Chronic bronchitis- cough, mucus. Abnormal CT with RUL nodularity in December.  Plan- refill albuterol hfa. Update CT chest for RUL nodularity.

## 2021-01-30 NOTE — Assessment & Plan Note (Signed)
Benefits with good compliance and control. Due for replacement. Discussed supply delays. Plan- change mask to nasal mask with chin strap. Replace ld CPAP auto 5-20.

## 2021-01-31 ENCOUNTER — Ambulatory Visit (INDEPENDENT_AMBULATORY_CARE_PROVIDER_SITE_OTHER)
Admission: RE | Admit: 2021-01-31 | Discharge: 2021-01-31 | Disposition: A | Discharge status: Home / Self Care | Payer: Medicare PPO | Source: Ambulatory Visit | Attending: Internal Medicine | Admitting: Internal Medicine

## 2021-01-31 ENCOUNTER — Other Ambulatory Visit: Payer: Self-pay

## 2021-01-31 DIAGNOSIS — J439 Emphysema, unspecified: Secondary | ICD-10-CM | POA: Diagnosis not present

## 2021-01-31 DIAGNOSIS — R918 Other nonspecific abnormal finding of lung field: Secondary | ICD-10-CM | POA: Diagnosis not present

## 2021-01-31 DIAGNOSIS — I700 Atherosclerosis of aorta: Secondary | ICD-10-CM | POA: Diagnosis not present

## 2021-01-31 DIAGNOSIS — R911 Solitary pulmonary nodule: Secondary | ICD-10-CM | POA: Diagnosis not present

## 2021-01-31 IMAGING — CT CT CHEST HIGH RESOLUTION W/O CM
2 of 7 series · 14 of 36 positions shown, 17 images · non-contrast
Comparison: CT chest, abdomen and pelvis dated [DATE]

CLINICAL DATA: History of lung nodules

EXAM:
CT CHEST WITHOUT CONTRAST
TECHNIQUE: Multidetector CT imaging of the chest was performed following the
standard protocol without intravenous contrast. High resolution
imaging of the lungs, as well as inspiratory and expiratory imaging,
was performed.

[Series 4: high resolution · axial · 0.78mm/px · z∈[-339,-83]mm · 11 of 308 slices shown, 14 images]
[im 26/308  mediastinal]
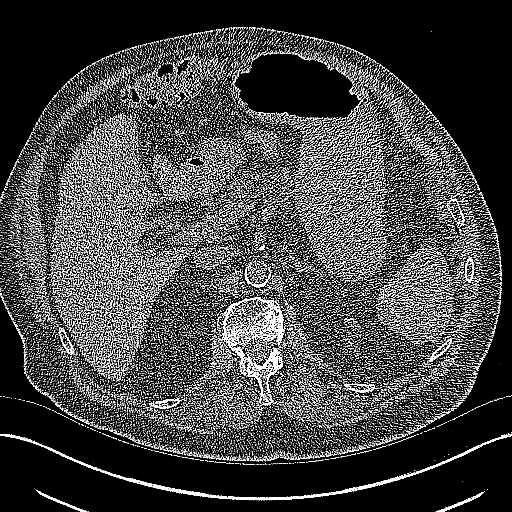
[im 26/308  lung]
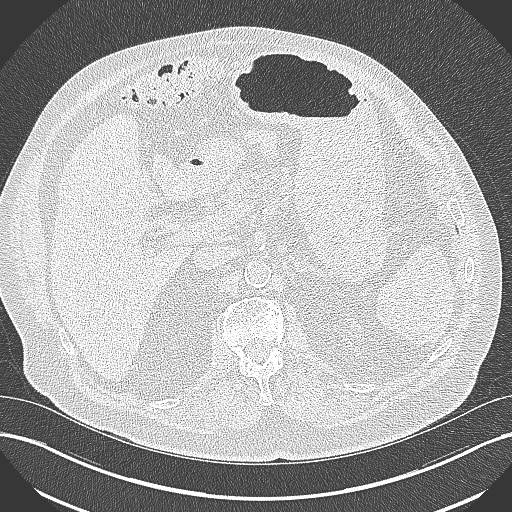
[im 52/308  lung]
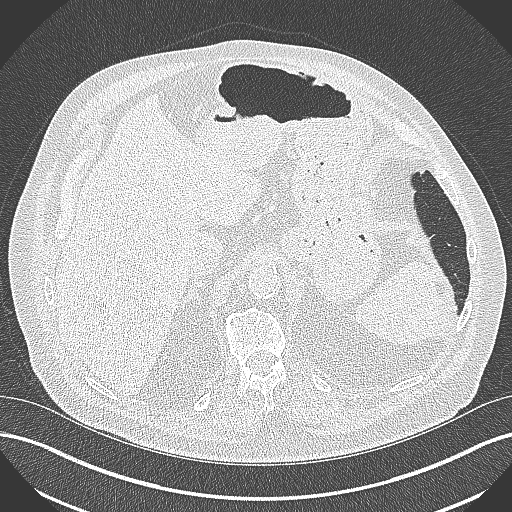
[im 77/308  lung]
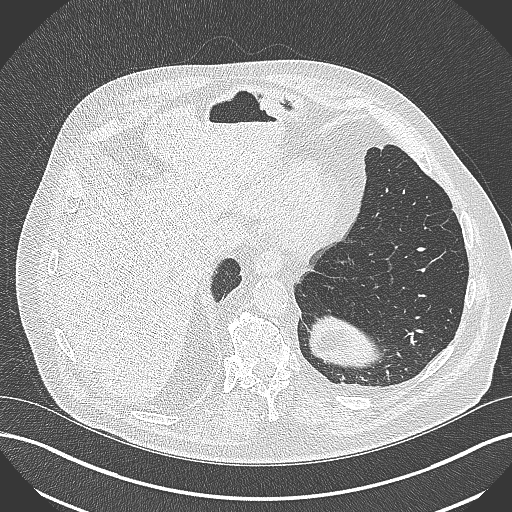
[im 103/308  lung]
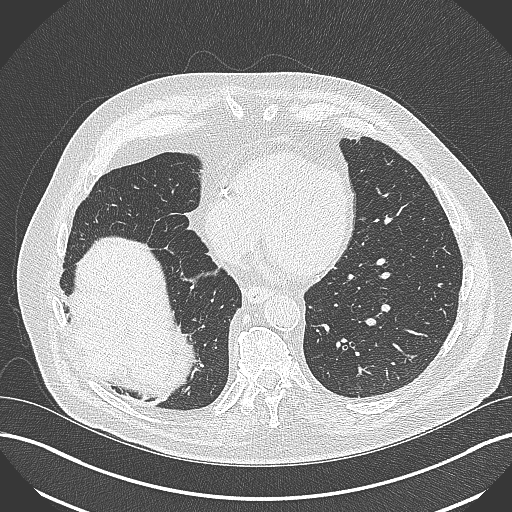
[im 128/308  mediastinal]
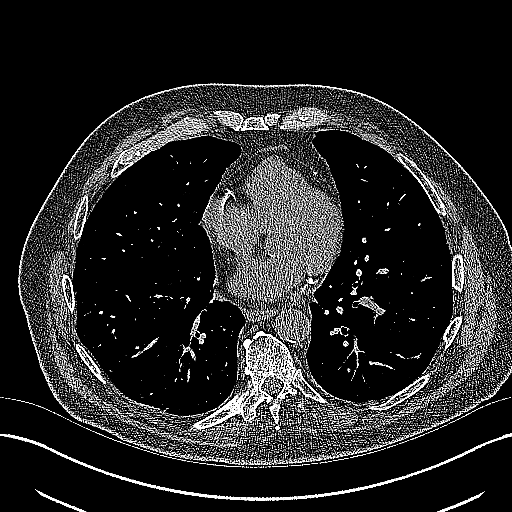
[im 128/308  lung]
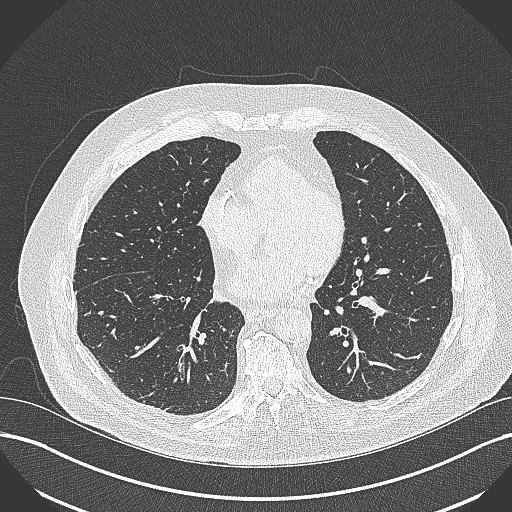
[im 154/308  lung]
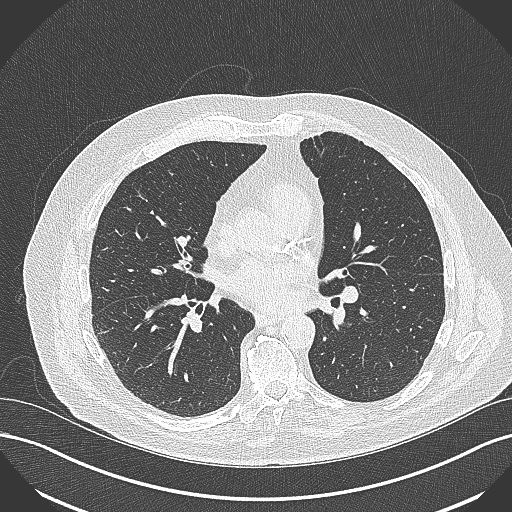
[im 180/308  lung]
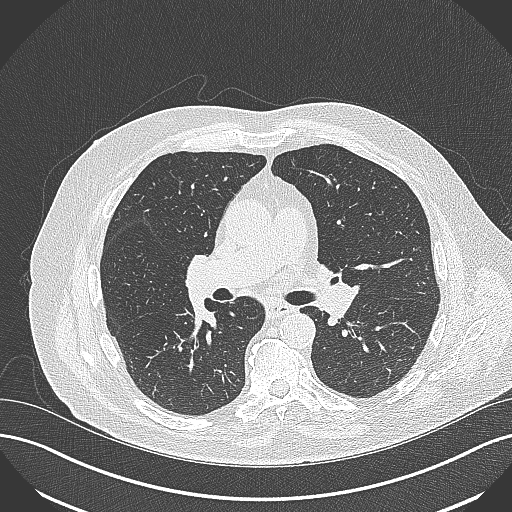
[im 205/308  lung]
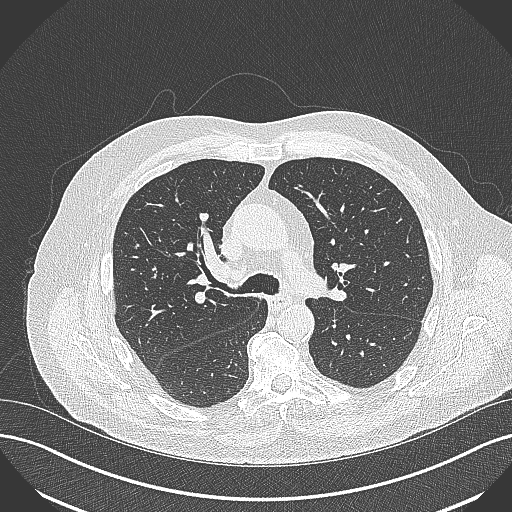
[im 231/308  mediastinal]
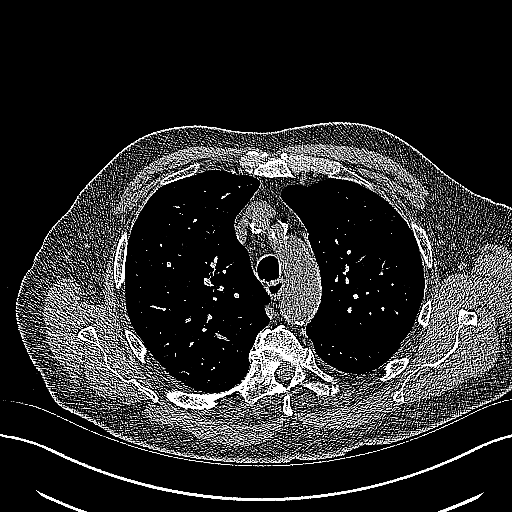
[im 231/308  lung]
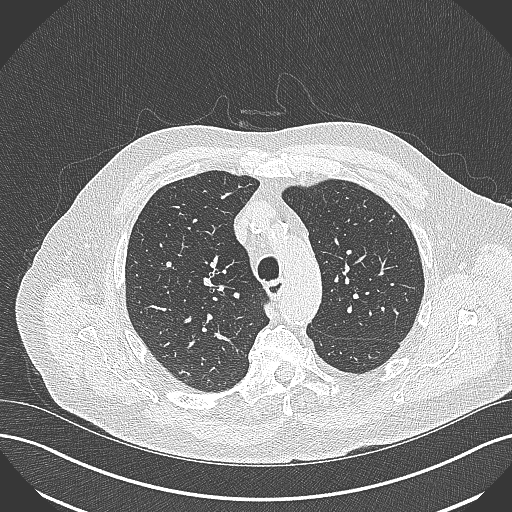
[im 256/308  lung]
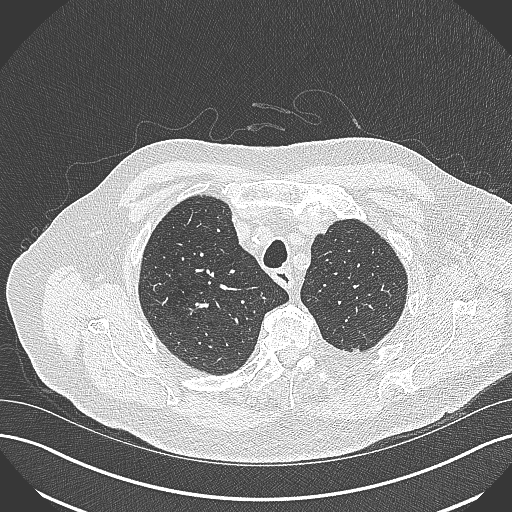
[im 282/308  lung]
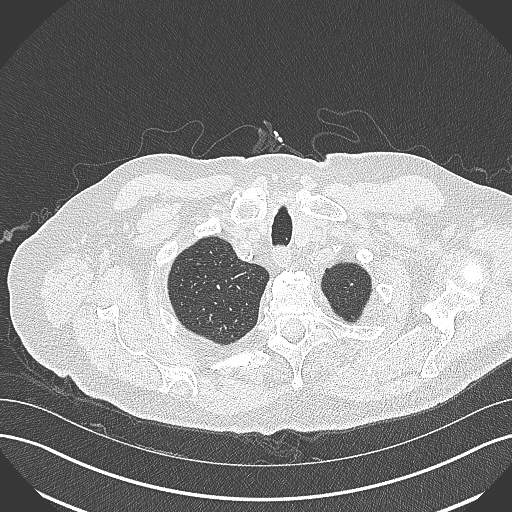

[Series 8: coronal · coronal · 0.63mm/px · 3 of 140 slices shown]
[im 28/140  lung]
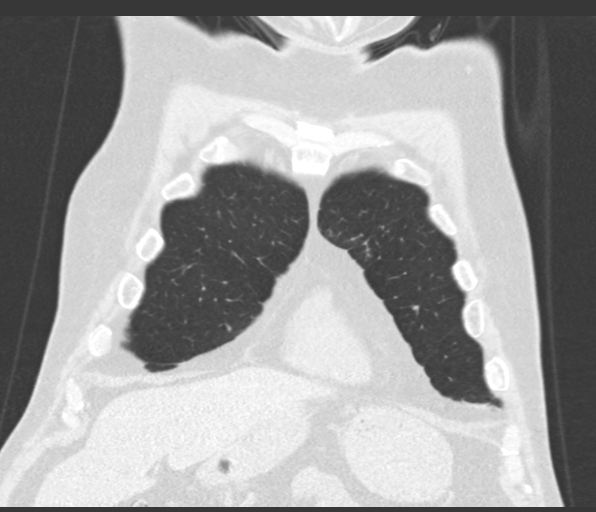
[im 56/140  lung]
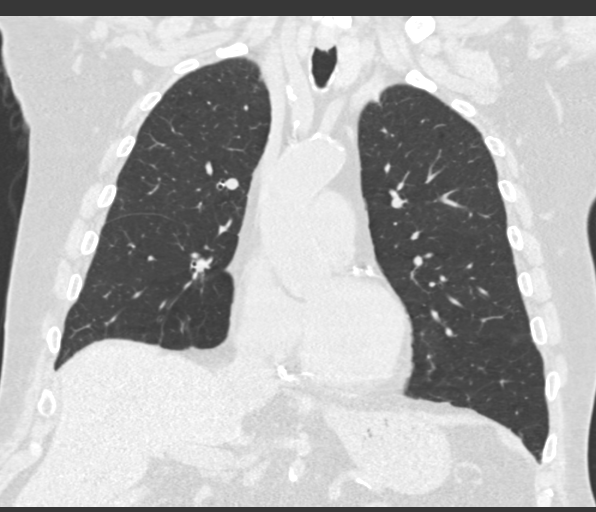
[im 84/140  lung]
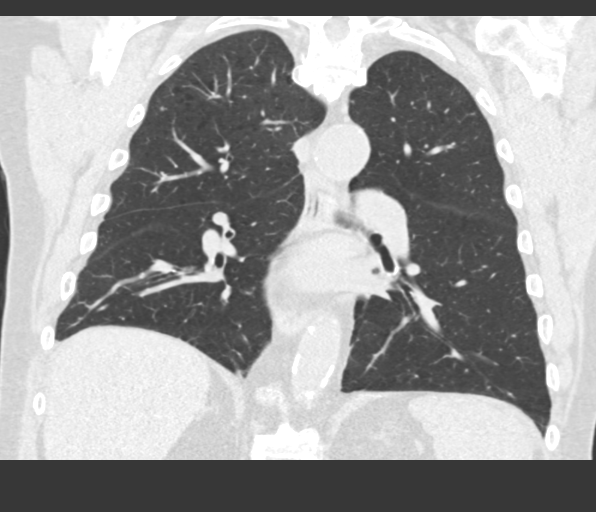

[14 of 36 positions shown; findings below may reference images not displayed]

FINDINGS: Cardiovascular: Normal heart size. No pericardial effusion.
Three-vessel coronary artery calcifications. Atherosclerotic disease
of the thoracic aorta.

Mediastinum/Nodes: No enlarged mediastinal, hilar, or axillary lymph
nodes. Thyroid gland, trachea, and esophagus demonstrate no
significant findings.

Lungs/Pleura: Central airways are patent. Upper lobe predominant
centrilobular emphysema. Small clustered nodules of the posterior
right upper lobe are unchanged compared to prior exam. Largest
measures 3 mm on series 2, image 44. New solid pulmonary nodule of
the right upper lobe measuring 5 mm on series 3, image 51. Cystic
lesion of the posterior right lower lobe on series 3, image 87 is
unchanged dating back to [XJ] prior exam and likely sequela of prior
infection. Other scattered small pulmonary nodules are stable.

Upper Abdomen: No acute abnormality.

Musculoskeletal: No chest wall mass or suspicious bone lesions
identified.
IMPRESSION: New solid nodule of the right upper lobe measuring 5 mm. Consider
follow-up in 3 months.

Previously seen clustered nodules of the right upper lobe are stable
compared to prior and likely sequela of prior infection.

Aortic Atherosclerosis ([XJ]-[XJ]) and Emphysema ([XJ]-[XJ]).

## 2021-02-02 ENCOUNTER — Other Ambulatory Visit: Payer: Self-pay | Admitting: *Deleted

## 2021-02-02 ENCOUNTER — Encounter: Payer: Self-pay | Admitting: *Deleted

## 2021-02-02 DIAGNOSIS — R918 Other nonspecific abnormal finding of lung field: Secondary | ICD-10-CM

## 2021-02-07 ENCOUNTER — Encounter: Payer: Self-pay | Admitting: Cardiology

## 2021-02-07 ENCOUNTER — Other Ambulatory Visit: Payer: Self-pay

## 2021-02-07 ENCOUNTER — Ambulatory Visit: Payer: Medicare PPO | Admitting: Cardiology

## 2021-02-07 VITALS — BP 141/57 | HR 73 | Temp 97.8°F | Resp 16 | Ht 67.0 in | Wt 194.0 lb

## 2021-02-07 DIAGNOSIS — E782 Mixed hyperlipidemia: Secondary | ICD-10-CM | POA: Diagnosis not present

## 2021-02-07 DIAGNOSIS — I10 Essential (primary) hypertension: Secondary | ICD-10-CM | POA: Diagnosis not present

## 2021-02-07 DIAGNOSIS — I2510 Atherosclerotic heart disease of native coronary artery without angina pectoris: Secondary | ICD-10-CM | POA: Insufficient documentation

## 2021-02-07 MED ORDER — EZETIMIBE 10 MG PO TABS: 10.0000 mg | ORAL_TABLET | Freq: Every day | ORAL | 3 refills | Status: DC

## 2021-02-07 NOTE — Progress Notes (Signed)
Patient referred by [name], MD for coronary calcification  Subjective:   Glen [name], male    DOB: [birth year], 80 y.o.   MRN: [id number]   Chief Complaint  Patient presents with   Coronary calcification     HPI  80 y.o. 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 He enjoys playing the banjo and plays regularly with his friends. Patient recently underwent CT scan for monitoring of lung nodules. This showed Three-vessel coronary artery calcifications and atherosclerotic disease of aorta.  Patient denies any chest pain. He has stable, unchanged exertional dyspnea for several years. He is already on Aspirin and lipitor for carotid artery disease-asymptomatic moderate right carotid stenosis, managed by Dr. Doren Custard.   Past Medical History:  Diagnosis Date   Allergic rhinitis, cause unspecified    Anxiety    Asthma    Cancer ([place]) 07/21/2018   colon   COPD (chronic obstructive pulmonary disease) (HCC)    Depression    Dyspnea    upon exertion   GERD (gastroesophageal reflux disease)    Hypertension    PUD (peptic ulcer disease)    avoids aspirin   Sleep apnea    uses C-PAP     Past Surgical History:  Procedure Laterality Date   CATARACT EXTRACTION, BILATERAL     COLONOSCOPY     LAPAROSCOPIC LYSIS OF ADHESIONS N/A 07/21/2018   Procedure: Laparoscopic Lysis Of Adhesions;  Surgeon: Fanny Skates, MD;  Location: [facility];  Service: General;  Laterality: N/A;   LAPAROSCOPIC PARTIAL COLECTOMY Left 07/21/2018   Procedure: LAPARASCOPIC TAKEDOWN OF SPLENIC FLEXURE; OPEN LEFT COLETCTOMY WITH PRIMARY ANASTOMOSIS;  Surgeon: Fanny Skates, MD;  Location: [facility];  Service: General;  Laterality: Left;   NASAL SEPTUM SURGERY  1960's    TONSILLECTOMY     UMBILICAL HERNIA REPAIR N/A 07/21/2018   Procedure: PRIMARY REPAIR OF UMBILICAL HERNIA , ADULT;  Surgeon: Fanny Skates, MD;  Location: [facility];  Service: General;  Laterality: N/A;     Social History   Tobacco Use  Smoking Status Former   Packs/day: 0.50   Types: Cigarettes   Quit date: 04/12/2011   Years since quitting: 9.8  Smokeless Tobacco Never    Social History   Substance and Sexual Activity  Alcohol Use Yes   Alcohol/week: 0.0 - 6.0 standard drinks   Comment: 1 liquor drink, 1 glass wine daily     Family History  Problem Relation Age of Onset   Alcohol abuse Father        commited suicide   Heart disease Father        had bypass   Breast cancer Mother    Prostate cancer Maternal Uncle        several      Current Outpatient Medications on File Prior to Visit  Medication Sig Dispense Refill   acetaminophen (TYLENOL) 650 MG CR tablet Take 1,950 mg by mouth at bedtime.     albuterol (PROAIR HFA) 108 (90 Base) MCG/ACT inhaler Inhale 2 puffs every 6 hours as needed 8.5 g 12   ALPRAZolam (XANAX) 0.25 MG tablet Take 0.25 mg by mouth daily as needed for anxiety.      amLODipine (NORVASC) 10 MG tablet Take 10 mg by mouth every evening.      aspirin EC 81  MG tablet Take 81 mg by mouth at bedtime.      atorvastatin (LIPITOR) 40 MG tablet Take 40 mg by mouth at bedtime.      diphenhydrAMINE (BENADRYL) 25 MG tablet Take 75 mg by mouth at bedtime.      fluticasone (FLONASE) 50 MCG/ACT nasal spray Place into both nostrils daily. As needed     gabapentin (NEURONTIN) 100 MG capsule Take 1 capsule (100 mg total) by mouth at bedtime. 90 capsule 1   gabapentin (NEURONTIN) 300 MG capsule TAKE 2 CAPSULES(600 MG) BY MOUTH AT BEDTIME 180 capsule 1   hydrochlorothiazide (MICROZIDE) 12.5 MG capsule Take 12.5 mg by mouth daily.      levalbuterol (XOPENEX) 0.63 MG/3ML nebulizer solution USE 1 VIAL VIA NEBULIZER EVERY 4 HOURS AS NEEDED FOR WHEEZING OR SHORTNESS OF  BREATH 1650 mL 1   omeprazole (PRILOSEC) 20 MG capsule Take 20 mg by mouth daily.     primidone (MYSOLINE) 50 MG tablet Take 1 tablet (50 mg total) by mouth 2 (two) times daily. 180 tablet 1   sertraline (ZOLOFT) 100 MG tablet Take 100 mg by mouth daily.   4   telmisartan (MICARDIS) 40 MG tablet Take 40 mg by mouth daily.     TRELEGY ELLIPTA 100-62.5-25 MCG/INH AEPB INHALE 1 PUFF INTO THE LUNGS DAILY 120 each 0   vitamin B-12 (CYANOCOBALAMIN) 1000 MCG tablet Take 1,000 mcg by mouth daily.     No current facility-administered medications on file prior to visit.    Cardiovascular and other pertinent studies:  EKG 02/07/2021: Sinus rhythm 62 bpm  Normal EKG   CT chest 01/31/2021: New solid nodule of the right upper lobe measuring 5 mm. Consider follow-up in 3 months.   Previously seen clustered nodules of the right upper lobe are stable compared to prior and likely sequela of prior infection.   Three-vessel coronary artery calcifications. Atherosclerotic disease of the thoracic aorta and Emphysema (ICD10-J43.9).  Carotid US 10/04/2020: Right Carotid: Velocities in the right ICA are consistent with a 60-79%                 stenosis. Calcified plaque with acoustic shadowing in the                 proximal segment may underestimate level of disease.  Left Carotid: Velocities in the left ICA are consistent with a 1-39%  stenosis.  Vertebrals:  Bilateral vertebral arteries demonstrate antegrade flow.  Subclavians: Normal flow hemodynamics were seen in bilateral subclavian               arteries.   Recent labs: 01/25/2021: Glucose 100, BUN/Cr 18/1.1. EGFR 67. Na/K 137/4.3. Rest of the CMP normal H/H 10/31.5. MCV 78. Platelets 256 HbA1C 6.3% Chol 110, TG 135, HDL 34, LDL 77   Review of Systems  Cardiovascular:  Positive for dyspnea on exertion. Negative for chest pain, leg swelling, palpitations and syncope.        Vitals:   02/07/21 1320  BP: (!) 141/57  Pulse: 73  Resp: 16   Temp: 97.8 F (36.6 C)  SpO2: 93%    Body mass index is 30.17 kg/m. Filed Weights   02/07/21 1320  Weight: 194 lb (88 kg)     Objective:   Physical Exam Vitals and nursing note reviewed.  Constitutional:      General: He is not in acute distress. Neck:     Vascular: No JVD.  Cardiovascular:     Rate and Rhythm: Normal  rate and regular rhythm.     Pulses:          Carotid pulses are  on the right side with bruit.    Heart sounds: Normal heart sounds. No murmur heard. Pulmonary:     Effort: Pulmonary effort is normal.     Breath sounds: Normal breath sounds. No wheezing or rales.  Musculoskeletal:     Right lower leg: No edema.     Left lower leg: No edema.         Assessment & Recommendations:   80 y.o. Caucasian male with hyperlipidemia, prediabetes, carotid artery disease, COPD, peripheral neuropathy, major depression, h/o colon cancer, now referred for evaluation management of coronary artery disease  Coronary calcification: Incidental finding on CT chest. No angina symptoms at this time. Mild exertional dyspnea stable for several years is likely due to COPD. He is already on Aspirin and lipitor with fairly well controlled lipids. Added Zetia 10 mg for further improvement in LDL with goal <70.  Recommend stress testing only if he develops ay symptoms s/o obstructive CAD-new chest pain or worsening exertional dyspnea  Hypertension: Well controlled at baseline. Suspect white coat hypertension. No change made today.  Thank you for referring the patient to [place]. Please feel free to contact with any questions.   Nigel Mormon, MD Pager: [phone] Office: [phone]

## 2021-02-08 DIAGNOSIS — Z86010 Personal history of colonic polyps: Secondary | ICD-10-CM | POA: Diagnosis not present

## 2021-02-08 DIAGNOSIS — E611 Iron deficiency: Secondary | ICD-10-CM | POA: Diagnosis not present

## 2021-02-19 ENCOUNTER — Other Ambulatory Visit: Payer: Self-pay | Admitting: Internal Medicine

## 2021-02-19 DIAGNOSIS — D140 Benign neoplasm of middle ear, nasal cavity and accessory sinuses: Secondary | ICD-10-CM | POA: Diagnosis not present

## 2021-02-19 DIAGNOSIS — H903 Sensorineural hearing loss, bilateral: Secondary | ICD-10-CM | POA: Diagnosis not present

## 2021-02-19 DIAGNOSIS — J342 Deviated nasal septum: Secondary | ICD-10-CM | POA: Diagnosis not present

## 2021-02-19 DIAGNOSIS — J343 Hypertrophy of nasal turbinates: Secondary | ICD-10-CM | POA: Diagnosis not present

## 2021-03-22 ENCOUNTER — Inpatient Hospital Stay: Payer: Medicare PPO | Attending: Oncology | Admitting: Oncology

## 2021-03-22 ENCOUNTER — Inpatient Hospital Stay: Payer: Medicare PPO

## 2021-03-22 ENCOUNTER — Other Ambulatory Visit: Payer: Self-pay

## 2021-03-22 VITALS — BP 128/60 | HR 87 | Temp 98.7°F | Resp 20 | Ht 67.0 in | Wt 190.8 lb

## 2021-03-22 DIAGNOSIS — E538 Deficiency of other specified B group vitamins: Secondary | ICD-10-CM | POA: Insufficient documentation

## 2021-03-22 DIAGNOSIS — C187 Malignant neoplasm of sigmoid colon: Secondary | ICD-10-CM | POA: Diagnosis not present

## 2021-03-22 DIAGNOSIS — C186 Malignant neoplasm of descending colon: Secondary | ICD-10-CM

## 2021-03-22 DIAGNOSIS — J449 Chronic obstructive pulmonary disease, unspecified: Secondary | ICD-10-CM | POA: Insufficient documentation

## 2021-03-22 DIAGNOSIS — I10 Essential (primary) hypertension: Secondary | ICD-10-CM | POA: Insufficient documentation

## 2021-03-22 DIAGNOSIS — G629 Polyneuropathy, unspecified: Secondary | ICD-10-CM | POA: Insufficient documentation

## 2021-03-22 LAB — CEA (ACCESS): CEA (CHCC): 2.18 ng/mL (ref 0.00–5.00)

## 2021-03-22 NOTE — Progress Notes (Signed)
  [facility] OFFICE PROGRESS NOTE   Diagnosis: Colon cancer  INTERVAL HISTORY:   Glen [name] returns as scheduled.  He feels well.  Good appetite.  No difficulty with bowel function.  No new complaint.  He continues follow-up with Dr. Annamaria Boots for for management of COPD.  Objective:  Vital signs in last 24 hours:  Blood pressure 128/60, pulse 87, temperature 98.7 F (37.1 C), temperature source Oral, resp. rate 20, height $RemoveBe'5\' 7"'nKDsokwIj$  (1.702 m), weight 190 lb 12.8 oz (86.5 kg), SpO2 100 %.    Lymphatics: No cervical, supraclavicular, axillary, or inguinal nodes Resp: Lungs clear bilaterally Cardio: Regular rate and rhythm GI: No hepatosplenomegaly, no mass Vascular: No leg edema  Lab Results:  Lab Results  Component Value Date   WBC 5.4 07/25/2018   HGB 8.9 (L) 07/25/2018   HCT 27.7 (L) 07/25/2018   MCV 93.3 07/25/2018   PLT 245 07/25/2018   NEUTROABS 5.3 07/17/2018    CMP  Lab Results  Component Value Date   NA 141 07/25/2018   K 3.3 (L) 07/25/2018   CL 105 07/25/2018   CO2 29 07/25/2018   GLUCOSE 107 (H) 07/25/2018   BUN <5 (L) 07/25/2018   CREATININE 0.81 07/25/2018   CALCIUM 8.1 (L) 07/25/2018   PROT 6.0 (L) 07/17/2018   ALBUMIN 3.4 (L) 07/17/2018   AST 19 07/17/2018   ALT 14 07/17/2018   ALKPHOS 85 07/17/2018   BILITOT 0.6 07/17/2018   GFRNONAA >60 07/25/2018   GFRAA >60 07/25/2018    Lab Results  Component Value Date   CEA1 1.87 09/14/2020   CEA 2.04 09/14/2020     Medications: I have reviewed the patient's current medications.   Assessment/Plan: Colon cancer  Colonoscopy by Dr. Oletta Lamas 06/22/2018-polypoid completely obstructing large mass was found in the proximal sigmoid colon.  Pathology showed fragments of ulcer and inflammatory debris; no viable tissue present.   CT abdomen/pelvis 06/30/2018-severe descending and sigmoid diverticulosis.  There was a focal dilatation of the mid descending colon to approximately 8.5 x 6.2 cm.  There was  no overt exophytic mass or mesenteric lymphadenopathy.  No evidence of abdominal metastatic disease.   07/21/2018 open left colectomy with primary anastomosis, primary repair of umbilical hernia by Dr. Dalbert Batman.  Pathology showed invasive moderately differentiated adenocarcinoma with mucinous features measuring 7.5 cm.  There was circumferential involvement of the descending colon with luminal obstruction.  Carcinoma focally invaded into the pericolonic soft tissue.  Margins negative.  No lymphovascular or perineural invasion.  21 lymph nodes negative for cancer; loss of expression MLH1 and PMS2; MSI high.  MLH1 hyper methylation present Colonoscopy 05/27/2019-polyps removed from the sigmoid, descending, cecum, ascending, and transverse colon, nodular mucosa at the colonic anastomosis biopsied-benign ulcerated mucosa, polyps were tubular adenomas, mucosal prolapse polyps,  and a hyperplastic polyp Upper endoscopy 06/22/2018-esophageal mucosal changes suggestive of long segment Barrett's esophagus.  A few gastric polyps.  Normal examined duodenum.  GERD.  Stomach biopsy-fundic gland polyp.  Negative for dysplasia.  Esophagus biopsy-Barrett's mucosa.  Negative for dysplasia.   COPD Hypertension B12 deficiency Peripheral neuropathy   Disposition: Glen. [name] is in clinical remission from colon cancer.  The CEA is normal.  He will return for an office visit and CEA in 6 months.  Betsy Coder, MD  03/22/2021  11:08 AM

## 2021-03-23 ENCOUNTER — Telehealth: Payer: Self-pay

## 2021-03-23 NOTE — Telephone Encounter (Signed)
Pt verbalized understanding. Stated he already received the flu shot.

## 2021-03-23 NOTE — Telephone Encounter (Signed)
-----   Message from Ladell Pier, MD sent at 03/22/2021  1:33 PM EST ----- Please call patient, CEA is normal, follow-up as scheduled, be sure he has received an influenza vaccine this year

## 2021-03-26 DIAGNOSIS — Z85038 Personal history of other malignant neoplasm of large intestine: Secondary | ICD-10-CM | POA: Diagnosis not present

## 2021-04-02 ENCOUNTER — Other Ambulatory Visit: Payer: Self-pay | Admitting: Surgery

## 2021-04-02 DIAGNOSIS — Z85038 Personal history of other malignant neoplasm of large intestine: Secondary | ICD-10-CM

## 2021-04-23 DIAGNOSIS — E611 Iron deficiency: Secondary | ICD-10-CM | POA: Diagnosis not present

## 2021-04-25 ENCOUNTER — Ambulatory Visit
Admission: RE | Admit: 2021-04-25 | Discharge: 2021-04-25 | Disposition: A | Discharge status: Home / Self Care | Payer: Medicare PPO | Source: Ambulatory Visit | Attending: Surgery | Admitting: Surgery

## 2021-04-25 ENCOUNTER — Other Ambulatory Visit: Payer: Self-pay

## 2021-04-25 DIAGNOSIS — K4020 Bilateral inguinal hernia, without obstruction or gangrene, not specified as recurrent: Secondary | ICD-10-CM | POA: Diagnosis not present

## 2021-04-25 DIAGNOSIS — I700 Atherosclerosis of aorta: Secondary | ICD-10-CM | POA: Diagnosis not present

## 2021-04-25 DIAGNOSIS — Z85038 Personal history of other malignant neoplasm of large intestine: Secondary | ICD-10-CM

## 2021-04-25 DIAGNOSIS — K5730 Diverticulosis of large intestine without perforation or abscess without bleeding: Secondary | ICD-10-CM | POA: Diagnosis not present

## 2021-04-25 IMAGING — CT CT ABD-PELV W/ CM
1 of 3 series · 12 of 32 positions shown, 18 images · IV contrast (APPLIED)
Comparison: Multiple priors including most recent CT abdomen
[DATE]

CLINICAL DATA: History of sigmoid colon cancer status post
resection.

EXAM:
CT ABDOMEN AND PELVIS WITH CONTRAST
TECHNIQUE: Multidetector CT imaging of the abdomen and pelvis was performed
using the standard protocol following bolus administration of
intravenous contrast.
Creatinine was obtained on site at [HOSPITAL] at [HOSPITAL].
Results: Creatinine 1.2 mg/dL.
CONTRAST:  100mL [EI] IOPAMIDOL ([EI]) INJECTION 61%

[Series 2: abd/pelvis w/cm · axial · 0.93mm/px · z∈[-400,-65]mm · 12 of 81 slices shown, 18 images]
[im 7/81  soft-tissue]
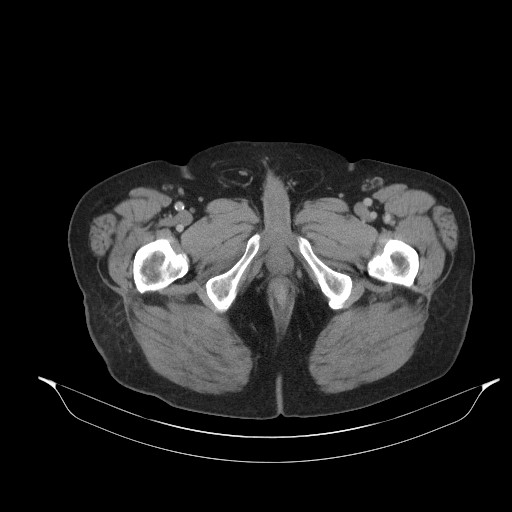
[im 7/81  bone]
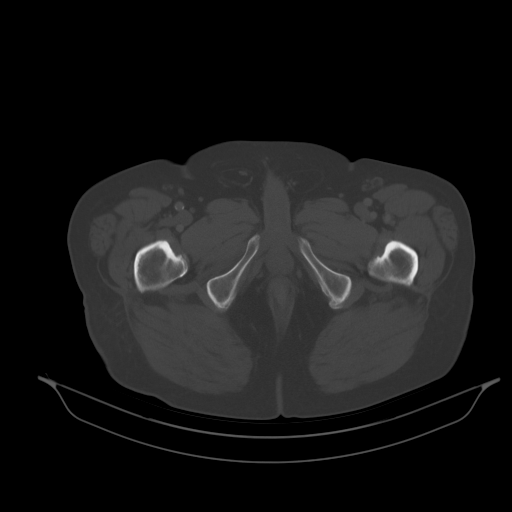
[im 13/81  soft-tissue]
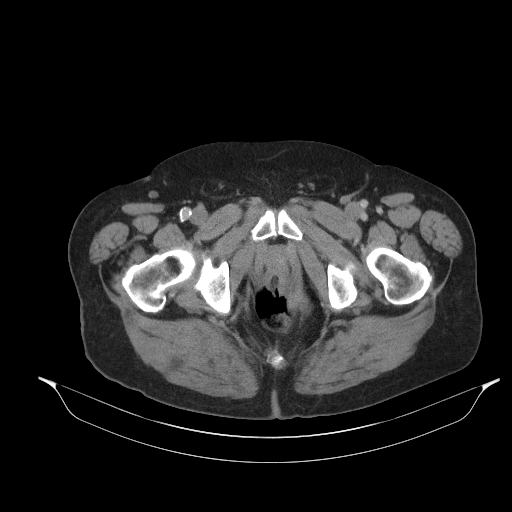
[im 19/81  soft-tissue]
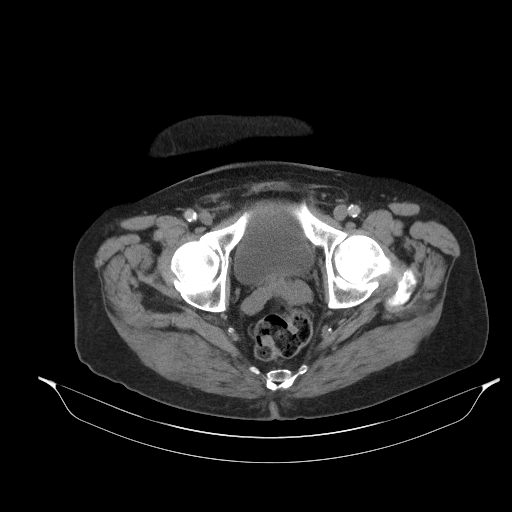
[im 25/81  soft-tissue]
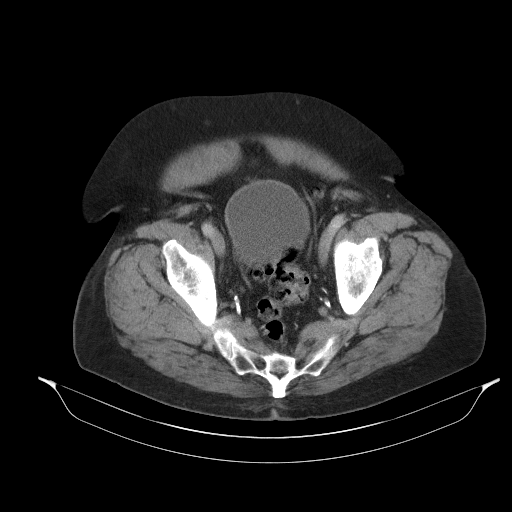
[im 31/81  soft-tissue]
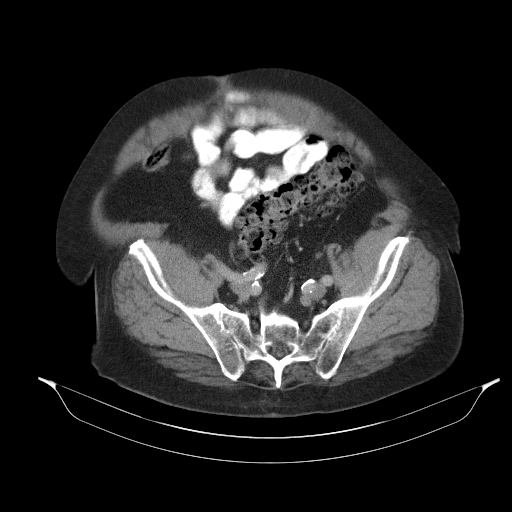
[im 37/81  soft-tissue]
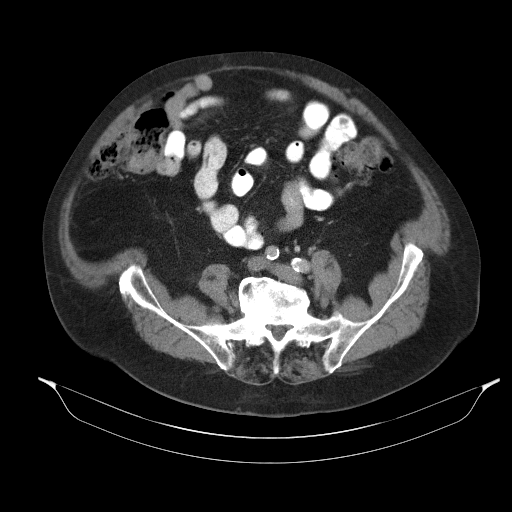
[im 44/81  soft-tissue]
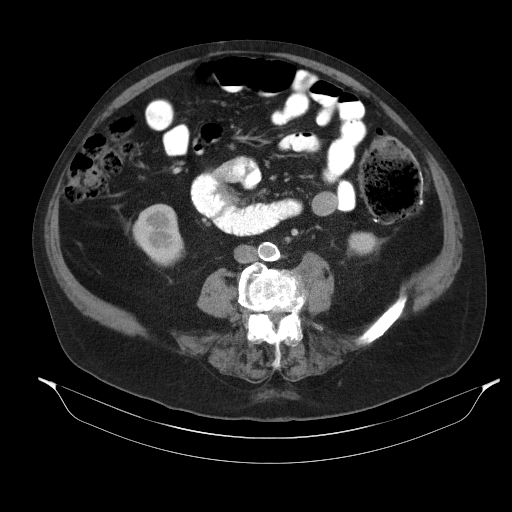
[im 50/81  soft-tissue]
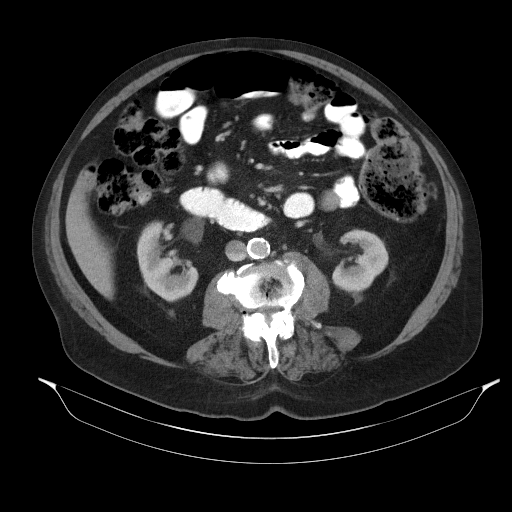
[im 56/81  soft-tissue]
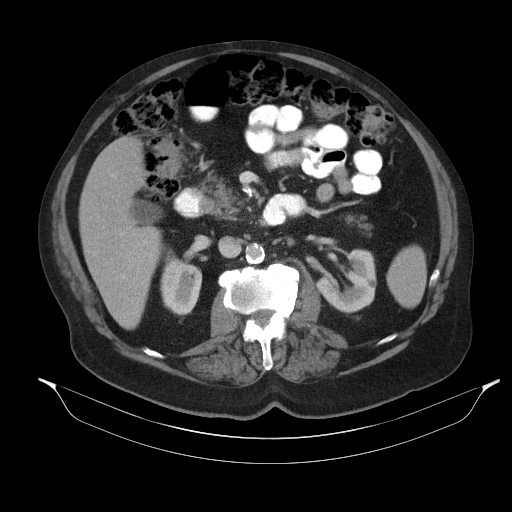
[im 56/81  lung]
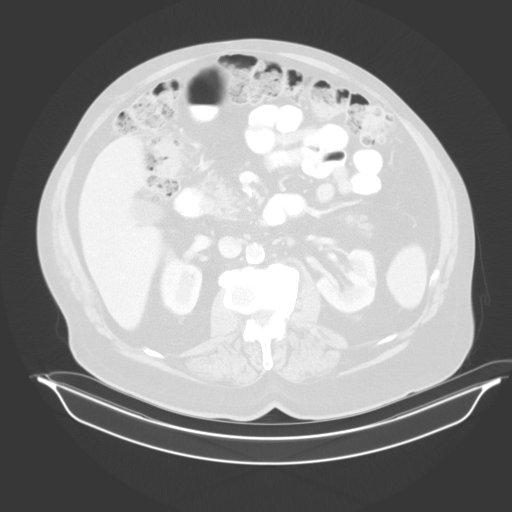
[im 56/81  bone]
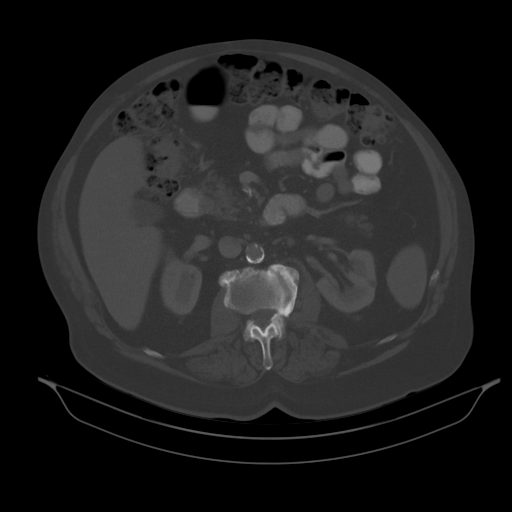
[im 62/81  soft-tissue]
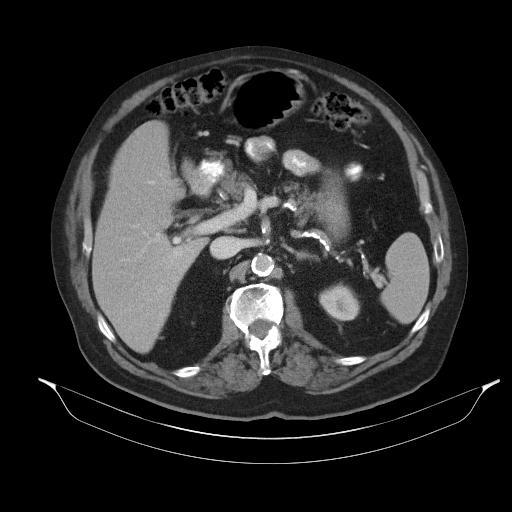
[im 62/81  lung]
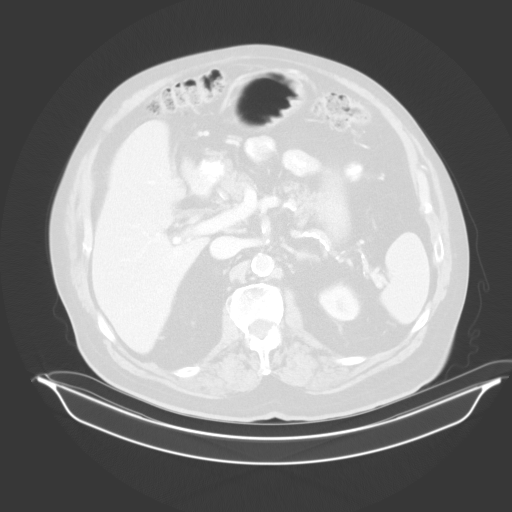
[im 68/81  soft-tissue]
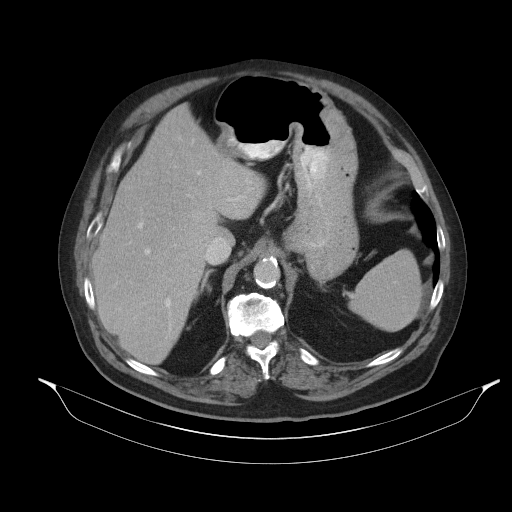
[im 68/81  lung]
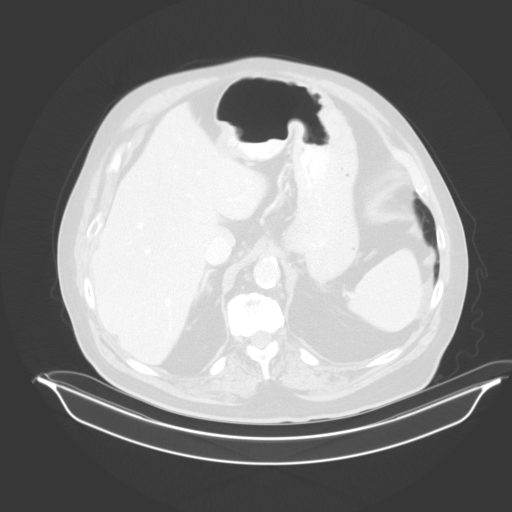
[im 74/81  soft-tissue]
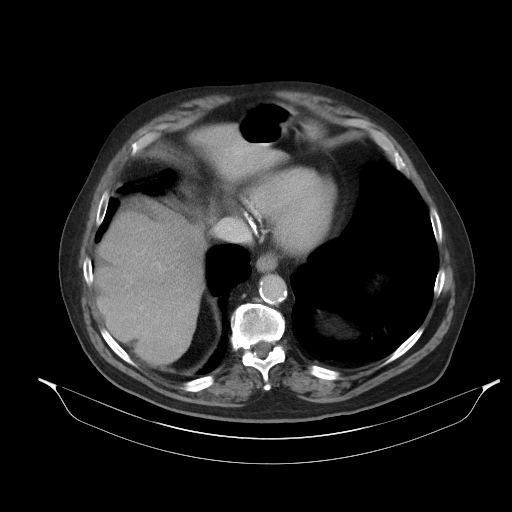
[im 74/81  lung]
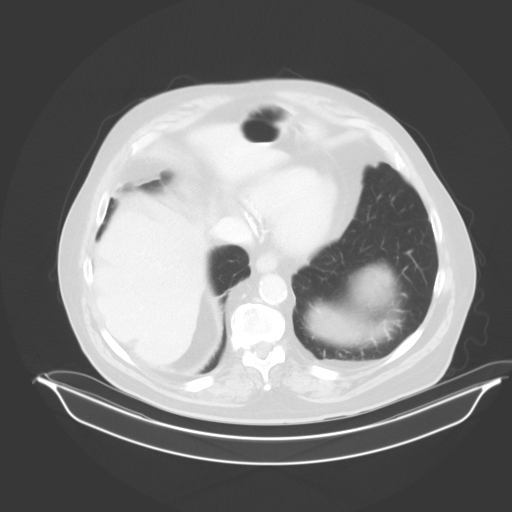

[12 of 32 positions shown; findings below may reference images not displayed]

FINDINGS: Lower chest: Evaluation lung bases is degraded by respiratory
motion. Bibasilar atelectasis for scarring.

Hepatobiliary: No suspicious hepatic lesion. Gallbladder is
unremarkable. No biliary ductal dilation.

Pancreas: No pancreatic ductal dilation or evidence of acute
inflammation.

Spleen: Within normal limits.

Adrenals/Urinary Tract: Bilateral adrenal glands are unremarkable.
No hydronephrosis. Bilateral subcentimeter hypodense renal lesions
are technically too small to accurately characterize but
statistically likely to reflect cysts. No solid enhancing renal
mass. Urinary bladder is unremarkable for degree of distension.

Stomach/Bowel: Radiopaque enteric contrast material traverses distal
loops of small bowel. No pathologic dilation of small or large
bowel. Postsurgical change of prior partial left hemicolectomy with
anastomotic sutures in the left hemiabdomen. Sigmoid colonic
diverticulosis without findings of acute diverticulitis.

Vascular/Lymphatic: Aortic and branch vessel atherosclerosis without
abdominal aortic aneurysm.

Previously indexed left periaortic chain lymph nodes measure up to 8
mm in short axis on image [DATE] on most recent prior measuring 1 cm
and on prior CT from [DATE] measured measuring 1.2 cm. No
new or enlarging abdominal or pelvic lymph nodes.

Reproductive: Prostate is unremarkable.

Other: Small fat containing bilateral inguinal hernias. Postsurgical
change in the abdominal wall.

Musculoskeletal: Multilevel degenerative changes spine. No
aggressive lytic or blastic lesion of bone.
IMPRESSION: 1. Prior partial left hemicolectomy without definite evidence of
metastatic disease in the abdomen or pelvis.
2. Further decrease in size of the now subcentimeter left periaortic
chain lymph nodes. No new or enlarging abdominal or pelvic lymph
nodes.
3. Sigmoid colonic diverticulosis without findings of acute
diverticulitis.
4.  Aortic Atherosclerosis ([EI]-[EI]).

## 2021-04-25 MED ORDER — IOPAMIDOL (ISOVUE-300) INJECTION 61%
100.0000 mL | Freq: Once | INTRAVENOUS | Status: AC | PRN
  Administered 2021-04-25: 16:00:00 100 mL via INTRAVENOUS

## 2021-04-26 ENCOUNTER — Ambulatory Visit (HOSPITAL_COMMUNITY): Payer: Medicare PPO

## 2021-04-26 ENCOUNTER — Ambulatory Visit: Payer: Medicare PPO | Admitting: Vascular Surgery

## 2021-04-30 ENCOUNTER — Other Ambulatory Visit (HOSPITAL_COMMUNITY): Payer: Self-pay | Admitting: Cardiology

## 2021-04-30 DIAGNOSIS — I2510 Atherosclerotic heart disease of native coronary artery without angina pectoris: Secondary | ICD-10-CM | POA: Diagnosis not present

## 2021-05-01 LAB — LIPID PANEL
Chol/HDL Ratio: 2.6 ratio (ref 0.0–5.0)
Cholesterol, Total: 102 mg/dL (ref 100–199)
HDL: 39 mg/dL — ABNORMAL LOW (ref >39)
LDL Chol Calc (NIH): 47 mg/dL (ref 0–99)
Triglycerides: 75 mg/dL (ref 0–149)
VLDL Cholesterol Cal: 16 mg/dL (ref 5–40)

## 2021-05-03 ENCOUNTER — Ambulatory Visit (INDEPENDENT_AMBULATORY_CARE_PROVIDER_SITE_OTHER)
Admission: RE | Admit: 2021-05-03 | Discharge: 2021-05-03 | Disposition: A | Discharge status: Home / Self Care | Payer: Medicare PPO | Source: Ambulatory Visit | Attending: Internal Medicine | Admitting: Internal Medicine

## 2021-05-03 ENCOUNTER — Other Ambulatory Visit: Payer: Self-pay

## 2021-05-03 DIAGNOSIS — R918 Other nonspecific abnormal finding of lung field: Secondary | ICD-10-CM | POA: Diagnosis not present

## 2021-05-03 DIAGNOSIS — J439 Emphysema, unspecified: Secondary | ICD-10-CM | POA: Diagnosis not present

## 2021-05-03 DIAGNOSIS — R911 Solitary pulmonary nodule: Secondary | ICD-10-CM | POA: Diagnosis not present

## 2021-05-03 DIAGNOSIS — I700 Atherosclerosis of aorta: Secondary | ICD-10-CM | POA: Diagnosis not present

## 2021-05-03 IMAGING — CT CT CHEST W/O CM
2 of 4 series · 15 of 36 positions shown, 18 images · non-contrast
Comparison: [DATE], [DATE]

CLINICAL DATA: Follow-up lung nodules

EXAM:
CT CHEST WITHOUT CONTRAST
TECHNIQUE: Multidetector CT imaging of the chest was performed following the
standard protocol without IV contrast.

[Series 2: thorax · axial · 0.94mm/px · z∈[+1355,+1643]mm · 12 of 172 slices shown, 15 images]
[im 14/172  mediastinal]
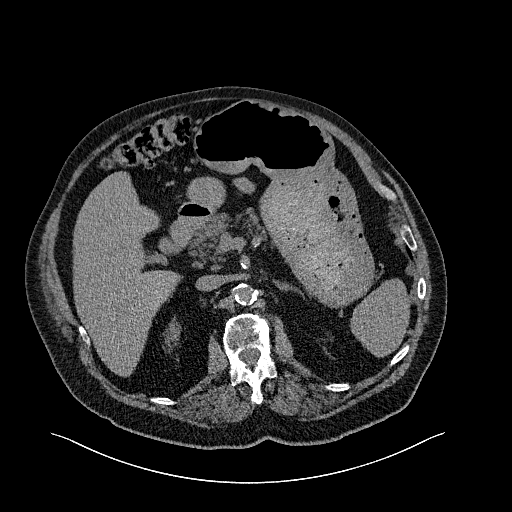
[im 14/172  lung]
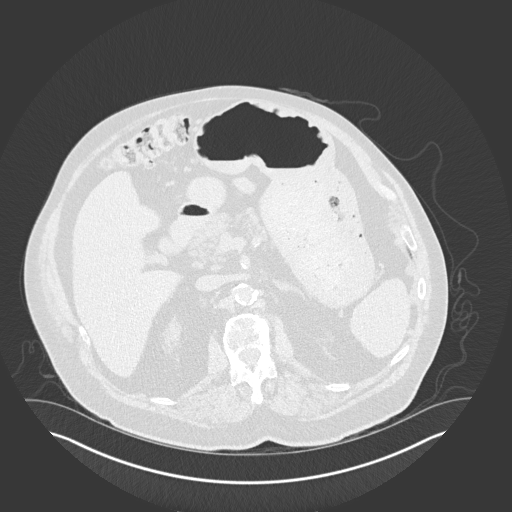
[im 27/172  lung]
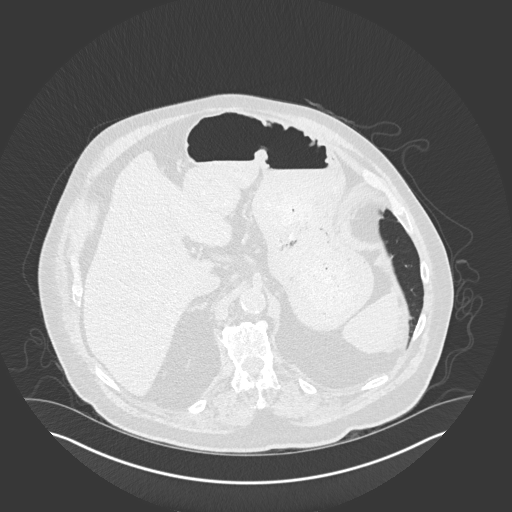
[im 40/172  lung]
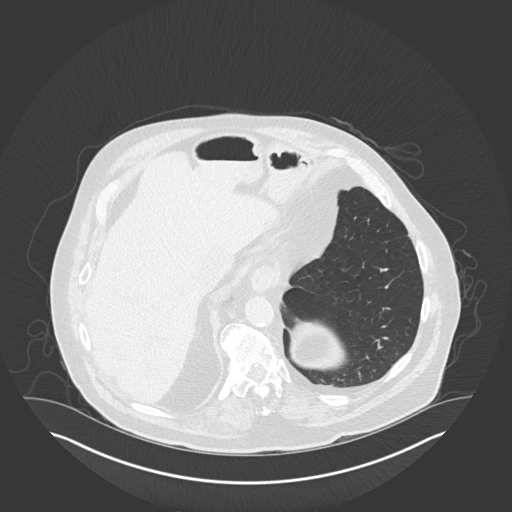
[im 53/172  lung]
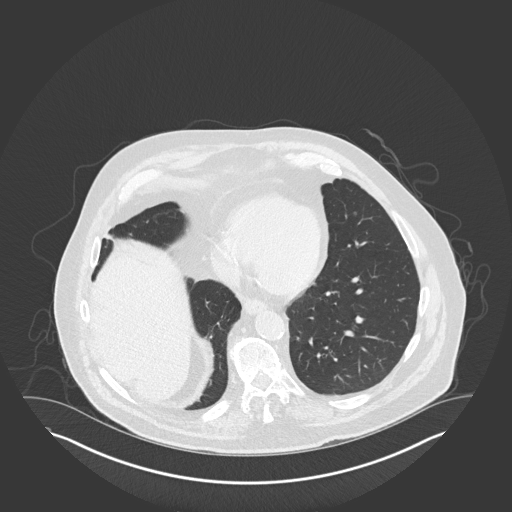
[im 66/172  mediastinal]
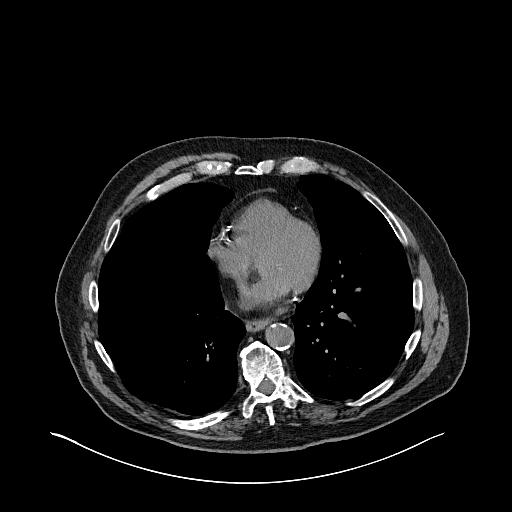
[im 66/172  lung]
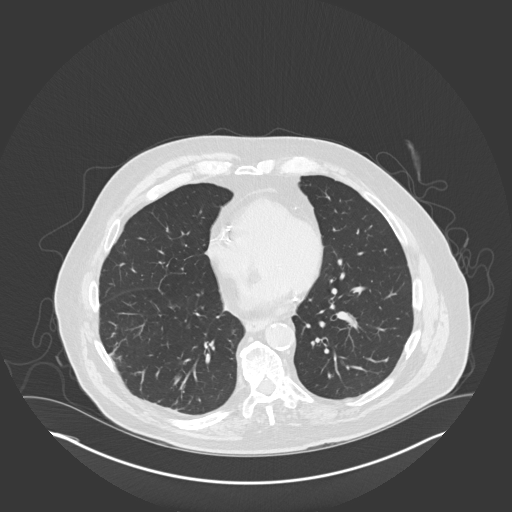
[im 79/172  lung]
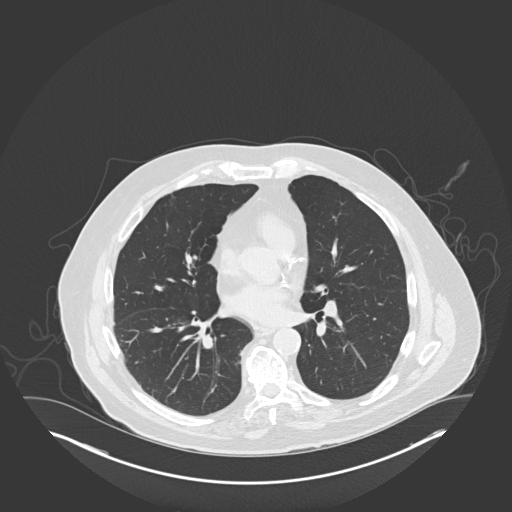
[im 93/172  lung]
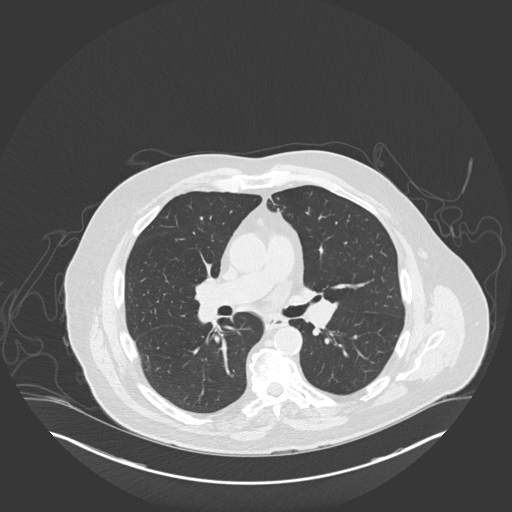
[im 106/172  lung]
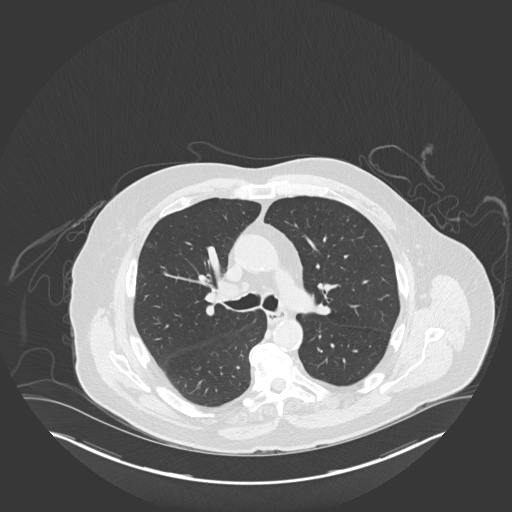
[im 119/172  mediastinal]
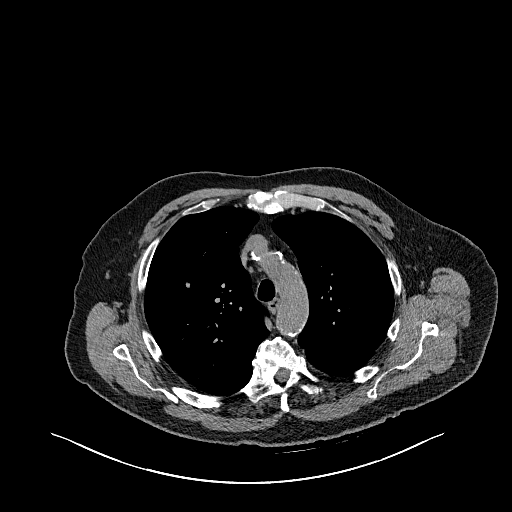
[im 119/172  lung]
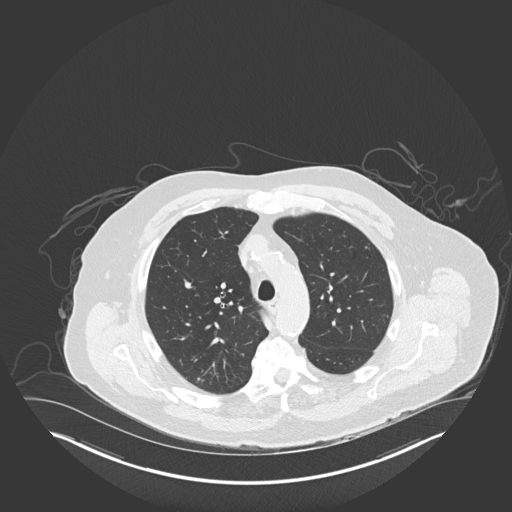
[im 132/172  lung]
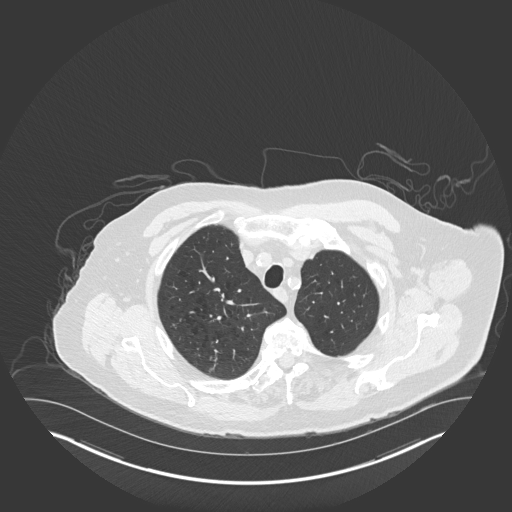
[im 145/172  lung]
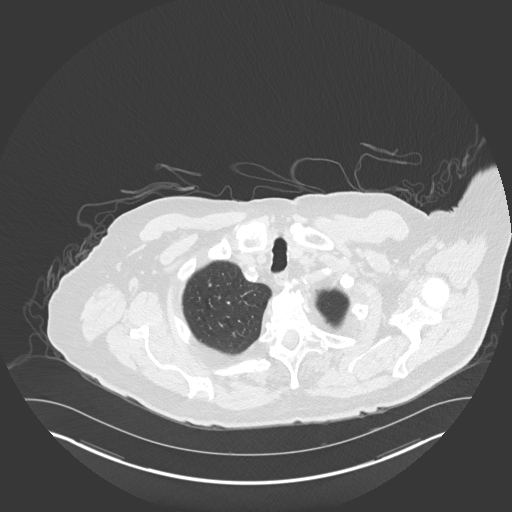
[im 158/172  lung]
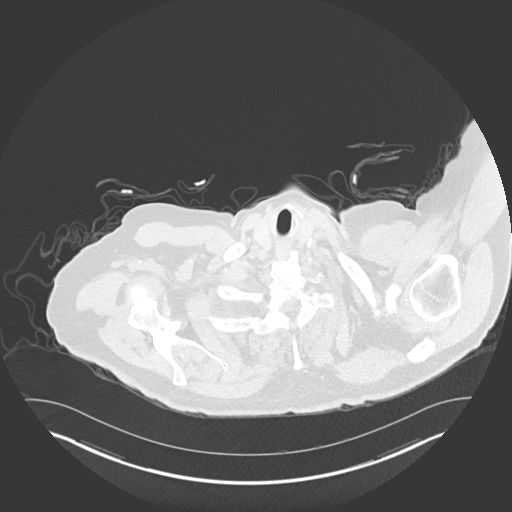

[Series 5: coronal · coronal · 0.67mm/px · 3 of 151 slices shown]
[im 31/151  lung]
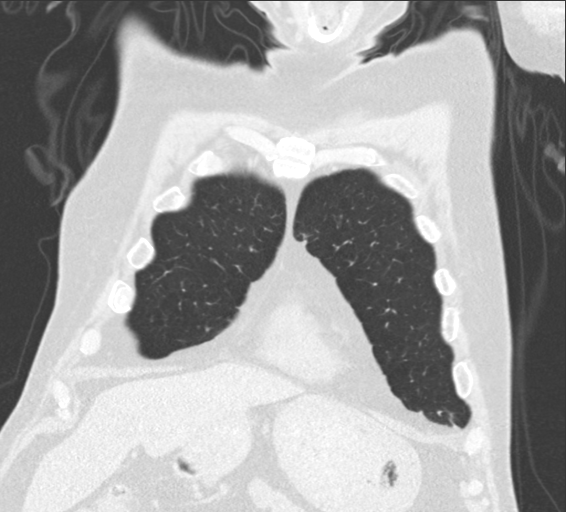
[im 61/151  lung]
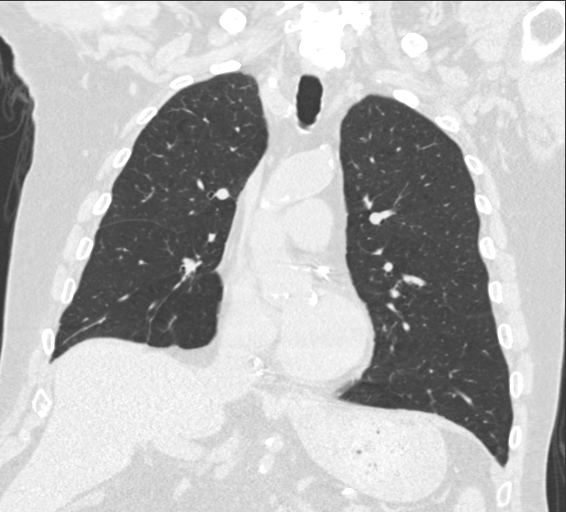
[im 91/151  lung]
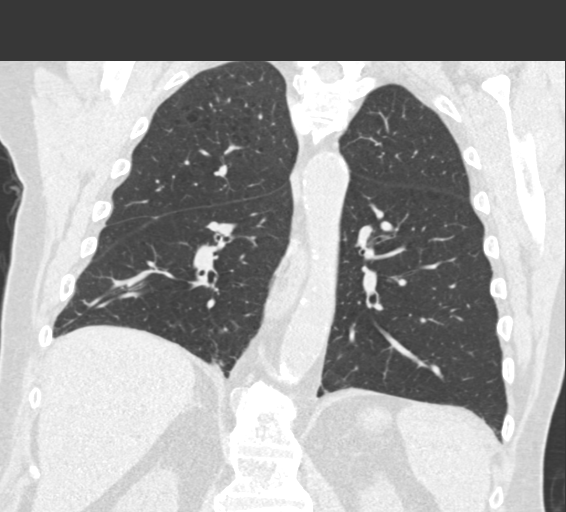

[15 of 36 positions shown; findings below may reference images not displayed]

FINDINGS: Cardiovascular: Aortic atherosclerosis. Normal heart size.
Three-vessel coronary artery calcifications. No pericardial
effusion.

Mediastinum/Nodes: No enlarged mediastinal, hilar, or axillary lymph
nodes. Thyroid gland, trachea, and esophagus demonstrate no
significant findings.

Lungs/Pleura: Mild centrilobular emphysema. Interval increase in
size and fullness of a nodule of the inferior right upper lobe,
measuring 1.1 x 0.5 cm, previously no greater than 0.8 x 0.3 cm
(series 3, image 64). Stable, benign, clustered centrilobular and
tree-in-bud nodules of the posterior right upper lobe (series 3,
image 40). No pleural effusion or pneumothorax.

Upper Abdomen: No acute abnormality.

Musculoskeletal: No chest wall mass or suspicious bone lesions
identified.
IMPRESSION: 1. Interval increase in size and fullness of a nodule of the
inferior right upper lobe, measuring 1.1 x 0.5 cm, previously no
greater than 0.8 x 0.3 cm. This is concerning for malignancy
although may reflect nonspecific infection or inflammation. Size is
near the lower limit of resolution for metabolic characterization by
PET-CT but may be amenable. Consider follow-up CT examination in 3
months.
2. Stable, benign, clustered centrilobular and tree-in-bud nodules
of the posterior right upper lobe, benign incidental sequelae of
prior infection or inflammation.
3. Emphysema.
4. Coronary artery disease.

Aortic Atherosclerosis ([NA]-[NA]) and Emphysema ([NA]-[NA]).

## 2021-05-04 ENCOUNTER — Other Ambulatory Visit: Payer: Self-pay

## 2021-05-04 DIAGNOSIS — R918 Other nonspecific abnormal finding of lung field: Secondary | ICD-10-CM

## 2021-05-09 ENCOUNTER — Other Ambulatory Visit: Payer: Self-pay

## 2021-05-09 ENCOUNTER — Encounter: Payer: Self-pay | Admitting: Cardiology

## 2021-05-09 ENCOUNTER — Ambulatory Visit: Payer: Medicare PPO | Admitting: Cardiology

## 2021-05-09 VITALS — BP 143/57 | HR 72 | Temp 97.5°F | Resp 16 | Ht 67.0 in | Wt 193.0 lb

## 2021-05-09 DIAGNOSIS — E782 Mixed hyperlipidemia: Secondary | ICD-10-CM

## 2021-05-09 DIAGNOSIS — I6521 Occlusion and stenosis of right carotid artery: Secondary | ICD-10-CM | POA: Diagnosis not present

## 2021-05-09 DIAGNOSIS — I2510 Atherosclerotic heart disease of native coronary artery without angina pectoris: Secondary | ICD-10-CM | POA: Diagnosis not present

## 2021-05-09 DIAGNOSIS — I10 Essential (primary) hypertension: Secondary | ICD-10-CM

## 2021-05-09 NOTE — Progress Notes (Signed)
Patient referred by [name], MD for coronary calcification  Subjective:   Glen [name], male    DOB: 1940/05/30, 80 y.o.   MRN: [id number]   Chief Complaint  Patient presents with   Coronary Artery Disease   Follow-up    3 month   Results    lab     HPI  80 y.o. Caucasian male with hyperlipidemia, prediabetes, carotid artery disease, COPD, peripheral neuropathy, major depression, h/o colon cancer, now referred for evaluation management of coronary artery disease  Patient is doing well, has no significant complaints. He was going to have carotid duplex with his vascular surgeon last week, but had to be rescheduled. Reviewed lipid panel results, details below.   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 He enjoys playing the banjo and plays regularly with his friends. Patient recently underwent CT scan for monitoring of lung nodules. This showed Three-vessel coronary artery calcifications and atherosclerotic disease of aorta.  Patient denies any chest pain. He has stable, unchanged exertional dyspnea for several years. He is already on Aspirin and lipitor for carotid artery disease-asymptomatic moderate right carotid stenosis, managed by Dr. Doren Custard.   Current Outpatient Medications on File Prior to Visit  Medication Sig Dispense Refill   acetaminophen (TYLENOL) 650 MG CR tablet Take 1,950 mg by mouth at bedtime.     albuterol (PROAIR HFA) 108 (90 Base) MCG/ACT inhaler Inhale 2 puffs every 6 hours as needed 8.5 g 12   ALPRAZolam (XANAX) 0.25 MG tablet Take 0.25 mg by mouth daily as needed for anxiety.      amLODipine (NORVASC) 10 MG tablet Take 10 mg by mouth every evening.      aspirin EC 81 MG tablet Take 81 mg by mouth at bedtime.      atorvastatin (LIPITOR) 40 MG tablet Take 40 mg by mouth at bedtime.      diphenhydrAMINE (SOMINEX) 25 MG tablet Take by mouth.     ezetimibe (ZETIA) 10 MG tablet  Take 1 tablet (10 mg total) by mouth daily. 30 tablet 3   fluticasone (FLONASE) 50 MCG/ACT nasal spray Place into both nostrils daily. As needed     gabapentin (NEURONTIN) 100 MG capsule Take 1 capsule (100 mg total) by mouth at bedtime. (Patient taking differently: Take 100 mg by mouth every morning.) 90 capsule 1   gabapentin (NEURONTIN) 300 MG capsule TAKE 2 CAPSULES(600 MG) BY MOUTH AT BEDTIME 180 capsule 1   hydrochlorothiazide (MICROZIDE) 12.5 MG capsule Take 12.5 mg by mouth daily.      levalbuterol (XOPENEX) 0.63 MG/3ML nebulizer solution USE 1 VIAL VIA NEBULIZER EVERY 4 HOURS AS NEEDED FOR WHEEZING OR SHORTNESS OF BREATH 1650 mL 1   omeprazole (PRILOSEC) 20 MG capsule Take 20 mg by mouth daily.     primidone (MYSOLINE) 50 MG tablet Take 1 tablet (50 mg total) by mouth 2 (two) times daily. 180 tablet 1   sertraline (ZOLOFT) 100 MG tablet Take 100 mg by mouth daily.   4   telmisartan (MICARDIS) 40 MG tablet Take 40 mg by mouth daily.     TRELEGY ELLIPTA 100-62.5-25 MCG/INH AEPB INHALE 1 PUFF INTO THE LUNGS DAILY 120 each 3   vitamin B-12 (CYANOCOBALAMIN) 1000 MCG tablet Take 1,000 mcg by mouth daily.     No current facility-administered medications on file prior to visit.    Cardiovascular and other pertinent studies:  EKG 02/07/2021: Sinus rhythm 62 bpm  Normal EKG   CT chest 01/31/2021: New solid nodule of the right upper lobe measuring 5 mm. Consider follow-up in 3 months.   Previously seen clustered nodules of the right upper lobe are stable compared to prior and likely sequela of prior infection.   Three-vessel coronary artery calcifications. Atherosclerotic disease of the thoracic aorta and Emphysema (ICD10-J43.9).  Carotid US 10/04/2020: Right Carotid: Velocities in the right ICA are consistent with a 60-79%                 stenosis. Calcified plaque with acoustic shadowing in the                 proximal segment may underestimate level of disease.  Left Carotid:  Velocities in the left ICA are consistent with a 1-39%  stenosis.  Vertebrals:  Bilateral vertebral arteries demonstrate antegrade flow.  Subclavians: Normal flow hemodynamics were seen in bilateral subclavian               arteries.   Recent labs: 04/30/2021: Chol 102, TG 75, HDL 39, LDL 47  01/25/2021: Glucose 100, BUN/Cr 18/1.1. EGFR 67. Na/K 137/4.3. Rest of the CMP normal H/H 10/31.5. MCV 78. Platelets 256 HbA1C 6.3% Chol 110, TG 135, HDL 34, LDL 77   Review of Systems  Cardiovascular:  Positive for dyspnea on exertion. Negative for chest pain, leg swelling, palpitations and syncope.        Vitals:   05/09/21 0923  BP: (!) 143/57  Pulse: 72  Resp: 16  Temp: (!) 97.5 F (36.4 C)  SpO2: 95%    Body mass index is 30.23 kg/m. Filed Weights   05/09/21 0923  Weight: 193 lb (87.5 kg)     Objective:   Physical Exam Vitals and nursing note reviewed.  Constitutional:      General: He is not in acute distress. Neck:     Vascular: No JVD.  Cardiovascular:     Rate and Rhythm: Normal rate and regular rhythm.     Pulses:          Carotid pulses are  on the right side with bruit.    Heart sounds: Normal heart sounds. No murmur heard. Pulmonary:     Effort: Pulmonary effort is normal.     Breath sounds: Normal breath sounds. No wheezing or rales.  Musculoskeletal:     Right lower leg: No edema.     Left lower leg: No edema.         Assessment & Recommendations:   80 y.o. Caucasian male with hyperlipidemia, prediabetes, carotid artery disease, COPD, peripheral neuropathy, major depression, h/o colon cancer, now referred for evaluation management of coronary artery disease  Coronary calcification: Incidental finding on CT chest. No angina symptoms at this time. Mild exertional dyspnea stable for several years is likely due to COPD. Continue Aspirin, lipitor, zetia.  Lipids very well controlled, LDL 47. Recommend stress testing only if he develops ay symptoms  s/o obstructive CAD-new chest pain or worsening exertional dyspnea  Carotid artery disease: Rt moderate stenosis, continue medical therapy and surveillance.  Hypertension: Well controlled at baseline. Suspect white coat hypertension. No change made today.  F/u in 1 year    Nigel Mormon, MD Pager: [phone] Office: [phone]

## 2021-05-17 ENCOUNTER — Ambulatory Visit: Payer: Medicare PPO | Admitting: Vascular Surgery

## 2021-05-17 ENCOUNTER — Ambulatory Visit (HOSPITAL_COMMUNITY)
Admission: RE | Admit: 2021-05-17 | Discharge: 2021-05-17 | Disposition: A | Discharge status: Home / Self Care | Payer: Medicare PPO | Source: Ambulatory Visit | Attending: Vascular Surgery | Admitting: Vascular Surgery

## 2021-05-17 ENCOUNTER — Other Ambulatory Visit: Payer: Self-pay

## 2021-05-17 ENCOUNTER — Encounter: Payer: Self-pay | Admitting: Vascular Surgery

## 2021-05-17 VITALS — BP 120/58 | HR 69 | Temp 98.4°F | Resp 20 | Ht 67.0 in | Wt 194.0 lb

## 2021-05-17 DIAGNOSIS — I6523 Occlusion and stenosis of bilateral carotid arteries: Secondary | ICD-10-CM | POA: Diagnosis not present

## 2021-05-17 NOTE — Progress Notes (Signed)
REASON FOR VISIT:   Follow-up of carotid disease.  MEDICAL ISSUES:   ASYMPTOMATIC 60 TO 79% RIGHT CAROTID STENOSIS: His right carotid stenosis is stable.  He is asymptomatic.  He is on aspirin and is on a statin.  He quit smoking several years ago.  He understands we would not consider carotid endarterectomy unless the stenosis progressed to greater than 80% or he develop new right hemispheric symptoms.  I have ordered a follow-up carotid duplex scan in 6 months and he will be seen by the PA at that time.  I have encouraged him to stay as active as possible.  He knows to call sooner if he has problems.   HPI:   Glen [name] is a pleasant 81 y.o. male who I have been following with a right carotid stenosis.  I last saw him on 10/04/2020.  At that time he had an asymptomatic 60 to 79% right carotid stenosis with no significant stenosis on the left.  The velocities on the right had actually improved slightly over the last 6 months.  He was on aspirin and was on a statin.  He had quit smoking several years ago.  I set up a follow-up duplex scan for 6 months.  Since I saw him last, he denies any history of stroke, TIAs, expressive or receptive aphasia, or amaurosis fugax.  There have been no significant changes to his medical history.  He is on aspirin and is on a statin.  Past Medical History:  Diagnosis Date   Allergic rhinitis, cause unspecified    Anxiety    Asthma    Cancer ([place]) 07/21/2018   colon   COPD (chronic obstructive pulmonary disease) ([facility])    Depression    Dyspnea    upon exertion   GERD (gastroesophageal reflux disease)    Hypertension    PUD (peptic ulcer disease)    avoids aspirin   Sleep apnea    uses C-PAP    Family History  Problem Relation Age of Onset   Alcohol abuse Father        commited suicide   Heart disease Father        had bypass   Breast cancer Mother    Prostate cancer Maternal Uncle        several     SOCIAL HISTORY: Social  History   Tobacco Use   Smoking status: Former    Packs/day: 0.50    Types: Cigarettes    Quit date: 04/12/2011    Years since quitting: 10.1   Smokeless tobacco: Never  Substance Use Topics   Alcohol use: Yes    Alcohol/week: 0.0 - 6.0 standard drinks    Comment: 1 liquor drink, 1 glass wine daily    Allergies  Allergen Reactions   Aspirin Other (See Comments)    pt has bleeding ulcers   Lisinopril Cough   Other     Other reaction(s): GI Upset (intolerance) AVOID NSAIDS    Current Outpatient Medications  Medication Sig Dispense Refill   acetaminophen (TYLENOL) 650 MG CR tablet Take 1,950 mg by mouth at bedtime.     albuterol (PROAIR HFA) 108 (90 Base) MCG/ACT inhaler Inhale 2 puffs every 6 hours as needed 8.5 g 12   ALPRAZolam (XANAX) 0.25 MG tablet Take 0.25 mg by mouth daily as needed for anxiety.      amLODipine (NORVASC) 10 MG tablet Take 10 mg by mouth every evening.      aspirin EC 81  MG tablet Take 81 mg by mouth at bedtime.      atorvastatin (LIPITOR) 40 MG tablet Take 40 mg by mouth at bedtime.      fluticasone (FLONASE) 50 MCG/ACT nasal spray Place into both nostrils daily. As needed     gabapentin (NEURONTIN) 100 MG capsule Take 1 capsule (100 mg total) by mouth at bedtime. (Patient taking differently: Take 100 mg by mouth every morning.) 90 capsule 1   gabapentin (NEURONTIN) 300 MG capsule TAKE 2 CAPSULES(600 MG) BY MOUTH AT BEDTIME 180 capsule 1   hydrochlorothiazide (MICROZIDE) 12.5 MG capsule Take 12.5 mg by mouth daily.      levalbuterol (XOPENEX) 0.63 MG/3ML nebulizer solution USE 1 VIAL VIA NEBULIZER EVERY 4 HOURS AS NEEDED FOR WHEEZING OR SHORTNESS OF BREATH 1650 mL 1   omeprazole (PRILOSEC) 20 MG capsule Take 20 mg by mouth daily.     primidone (MYSOLINE) 50 MG tablet Take 1 tablet (50 mg total) by mouth 2 (two) times daily. 180 tablet 1   sertraline (ZOLOFT) 100 MG tablet Take 100 mg by mouth daily.   4   telmisartan (MICARDIS) 40 MG tablet Take 40 mg by  mouth daily.     TRELEGY ELLIPTA 100-62.5-25 MCG/INH AEPB INHALE 1 PUFF INTO THE LUNGS DAILY 120 each 3   vitamin B-12 (CYANOCOBALAMIN) 1000 MCG tablet Take 1,000 mcg by mouth daily.     ezetimibe (ZETIA) 10 MG tablet Take 1 tablet (10 mg total) by mouth daily. 30 tablet 3   No current facility-administered medications for this visit.    REVIEW OF SYSTEMS:  [X]  denotes positive finding, [ ]  denotes negative finding Cardiac  Comments:  Chest pain or chest pressure:    Shortness of breath upon exertion: x   Short of breath when lying flat:    Irregular heart rhythm:        Vascular    Pain in calf, thigh, or hip brought on by ambulation:    Pain in feet at night that wakes you up from your sleep:     Blood clot in your veins:    Leg swelling:         Pulmonary    Oxygen at home:    Productive cough:     Wheezing:  x       Neurologic    Sudden weakness in arms or legs:     Sudden numbness in arms or legs:     Sudden onset of difficulty speaking or slurred speech:    Temporary loss of vision in one eye:     Problems with dizziness:         Gastrointestinal    Blood in stool:     Vomited blood:         Genitourinary    Burning when urinating:     Blood in urine:        Psychiatric    Major depression:         Hematologic    Bleeding problems:    Problems with blood clotting too easily:        Skin    Rashes or ulcers:        Constitutional    Fever or chills:     PHYSICAL EXAM:   Vitals:   05/17/21 1607 05/17/21 1608  BP: 119/60 (!) 120/58  Pulse: 69   Resp: 20   Temp: 98.4 F (36.9 C)   SpO2: 94%   Weight: 194 lb (88 kg)  Height: 5\' 7"  (1.702 m)     GENERAL: The patient is a well-nourished male, in no acute distress. The vital signs are documented above. CARDIAC: There is a regular rate and rhythm.  VASCULAR: I do not detect carotid bruits. He has mild bilateral lower extremity swelling. Both feet are warm and well-perfused. PULMONARY: There is  good air exchange bilaterally without wheezing or rales. ABDOMEN: Soft and non-tender with normal pitched bowel sounds.  MUSCULOSKELETAL: There are no major deformities or cyanosis. NEUROLOGIC: No focal weakness or paresthesias are detected. SKIN: There are no ulcers or rashes noted. PSYCHIATRIC: The patient has a normal affect.  DATA:    CAROTID DUPLEX: I have independently interpreted his carotid duplex scan today.  On the right side there is a 60 to 79% stenosis. (584/41 -the peak systolic velocity has improved compared to his previous study).  Right vertebral artery is patent with antegrade flow.  On the left side there is a less than 39% stenosis.  The left vertebral artery is patent with antegrade flow.  Deitra [facility] [phone]

## 2021-05-22 ENCOUNTER — Other Ambulatory Visit: Payer: Self-pay | Admitting: *Deleted

## 2021-05-22 DIAGNOSIS — I6523 Occlusion and stenosis of bilateral carotid arteries: Secondary | ICD-10-CM

## 2021-05-28 ENCOUNTER — Other Ambulatory Visit: Payer: Self-pay | Admitting: Cardiology

## 2021-05-28 DIAGNOSIS — I2510 Atherosclerotic heart disease of native coronary artery without angina pectoris: Secondary | ICD-10-CM

## 2021-06-03 ENCOUNTER — Other Ambulatory Visit: Payer: Self-pay | Admitting: Neurology

## 2021-06-26 ENCOUNTER — Other Ambulatory Visit: Payer: Self-pay | Admitting: Neurology

## 2021-06-26 DIAGNOSIS — G250 Essential tremor: Secondary | ICD-10-CM

## 2021-07-23 ENCOUNTER — Other Ambulatory Visit: Payer: Self-pay | Admitting: Neurology

## 2021-07-23 DIAGNOSIS — G250 Essential tremor: Secondary | ICD-10-CM

## 2021-07-25 DIAGNOSIS — I2510 Atherosclerotic heart disease of native coronary artery without angina pectoris: Secondary | ICD-10-CM | POA: Diagnosis not present

## 2021-07-25 DIAGNOSIS — F321 Major depressive disorder, single episode, moderate: Secondary | ICD-10-CM | POA: Diagnosis not present

## 2021-07-25 DIAGNOSIS — E611 Iron deficiency: Secondary | ICD-10-CM | POA: Diagnosis not present

## 2021-07-25 DIAGNOSIS — Z79899 Other long term (current) drug therapy: Secondary | ICD-10-CM | POA: Diagnosis not present

## 2021-07-25 DIAGNOSIS — I10 Essential (primary) hypertension: Secondary | ICD-10-CM | POA: Diagnosis not present

## 2021-07-25 DIAGNOSIS — G629 Polyneuropathy, unspecified: Secondary | ICD-10-CM | POA: Diagnosis not present

## 2021-07-25 DIAGNOSIS — F419 Anxiety disorder, unspecified: Secondary | ICD-10-CM | POA: Diagnosis not present

## 2021-07-25 DIAGNOSIS — G8929 Other chronic pain: Secondary | ICD-10-CM | POA: Diagnosis not present

## 2021-07-25 DIAGNOSIS — R7303 Prediabetes: Secondary | ICD-10-CM | POA: Diagnosis not present

## 2021-07-30 ENCOUNTER — Telehealth: Payer: Self-pay | Admitting: Neurology

## 2021-07-30 NOTE — Telephone Encounter (Signed)
Called pharmacy they can not fill until tomorrow  ?

## 2021-07-30 NOTE — Telephone Encounter (Signed)
1. Which medications need refilled? (List name and dosage, if known) gabapentin 300MG  ? ?2. Which pharmacy/location is medication to be sent to? (include street and city if Management consultant) [facility] on [address] in [place]  ? ?3. Do they need a 30 day or 90 day supply? 90 day supply ? ?Patient is out of the medication and has been out of it since in 07/20/21. ? ?

## 2021-07-31 ENCOUNTER — Ambulatory Visit
Admission: RE | Admit: 2021-07-31 | Discharge: 2021-07-31 | Disposition: A | Discharge status: Home / Self Care | Payer: Medicare PPO | Source: Ambulatory Visit | Attending: Internal Medicine | Admitting: Internal Medicine

## 2021-07-31 ENCOUNTER — Other Ambulatory Visit: Payer: Self-pay

## 2021-07-31 DIAGNOSIS — J9811 Atelectasis: Secondary | ICD-10-CM | POA: Diagnosis not present

## 2021-07-31 DIAGNOSIS — J439 Emphysema, unspecified: Secondary | ICD-10-CM | POA: Diagnosis not present

## 2021-07-31 DIAGNOSIS — R911 Solitary pulmonary nodule: Secondary | ICD-10-CM | POA: Diagnosis not present

## 2021-07-31 DIAGNOSIS — R918 Other nonspecific abnormal finding of lung field: Secondary | ICD-10-CM

## 2021-07-31 IMAGING — CT CT CHEST W/O CM
1 of 2 series · 15 of 31 positions shown, 19 images · non-contrast
Comparison: [DATE]

CLINICAL DATA: [DATE].



[Series 4: super d · axial · 0.90mm/px · z∈[-379,-60]mm · 15 of 447 slices shown, 19 images]
[im 24/447  mediastinal]
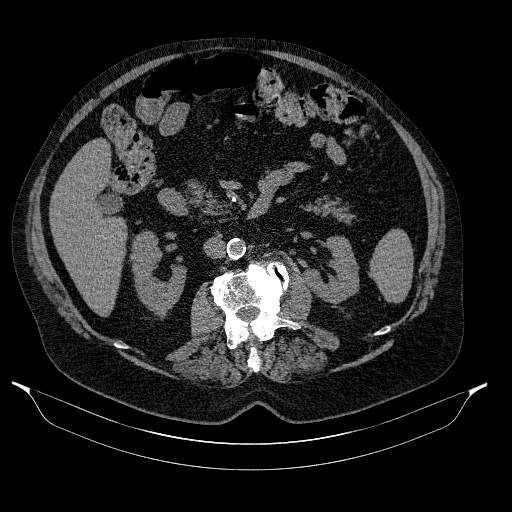
[im 24/447  lung]
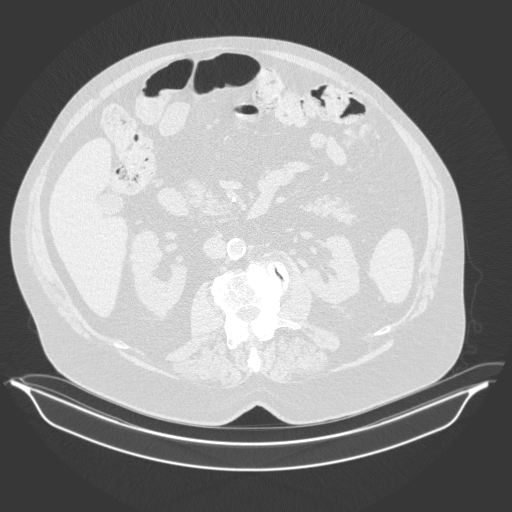
[im 71/447  lung]
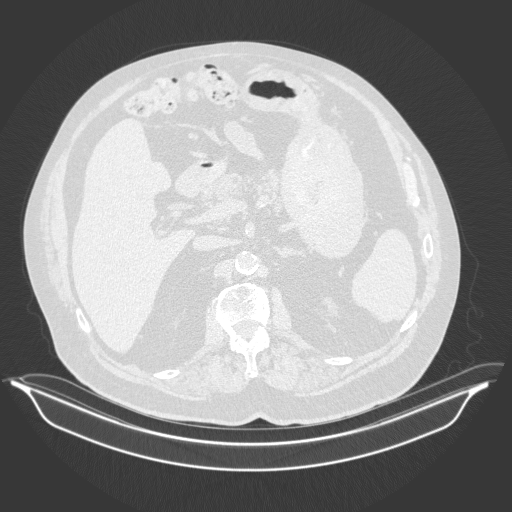
[im 94/447  lung]
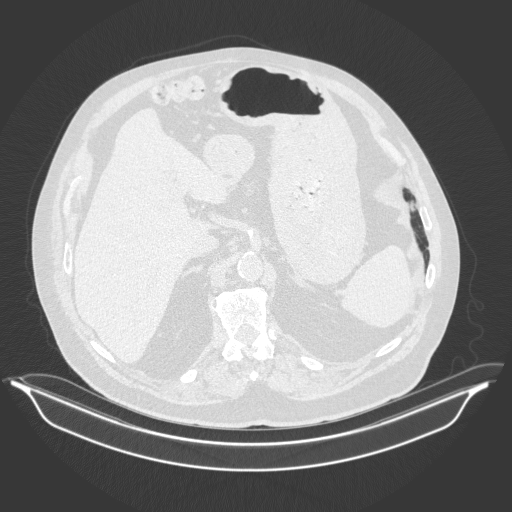
[im 118/447  lung]
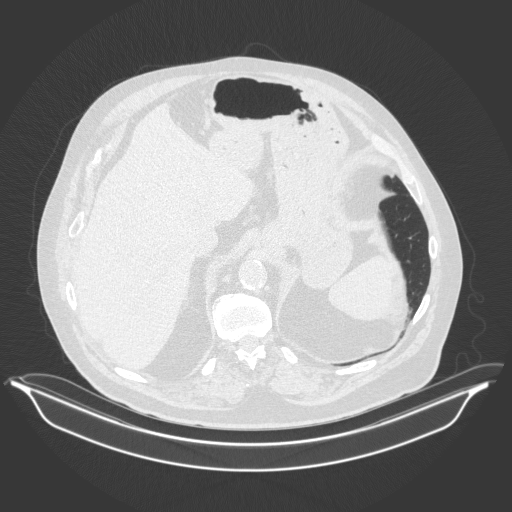
[im 149/447  mediastinal]
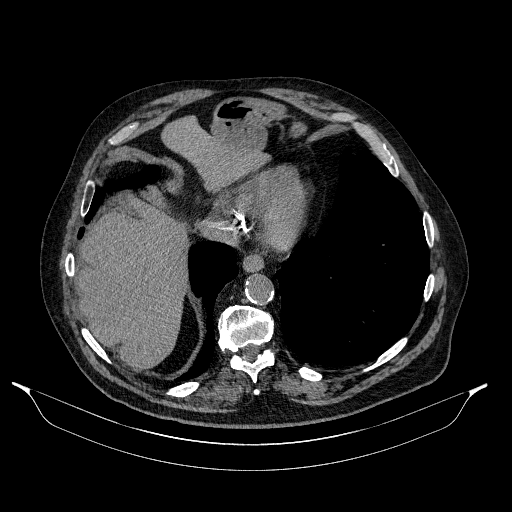
[im 149/447  lung]
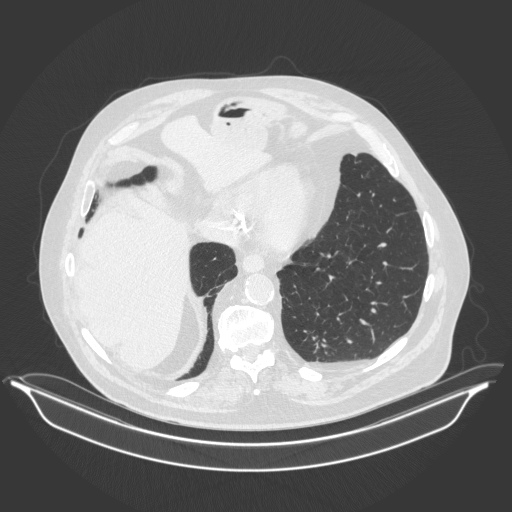
[im 165/447  lung]
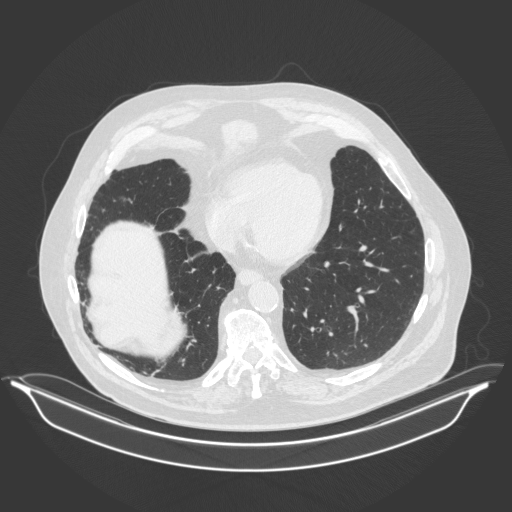
[im 188/447  lung]
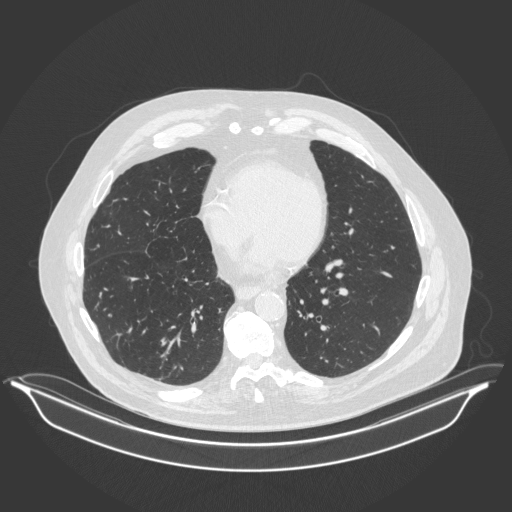
[im 235/447  lung]
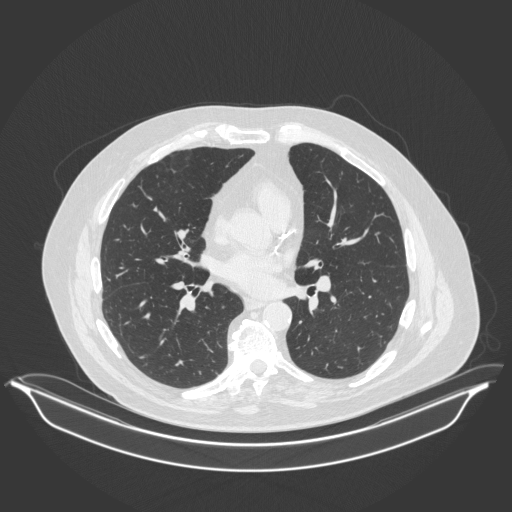
[im 259/447  mediastinal]
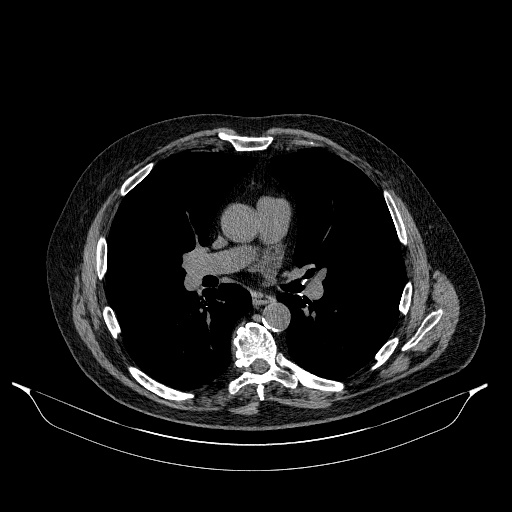
[im 259/447  lung]
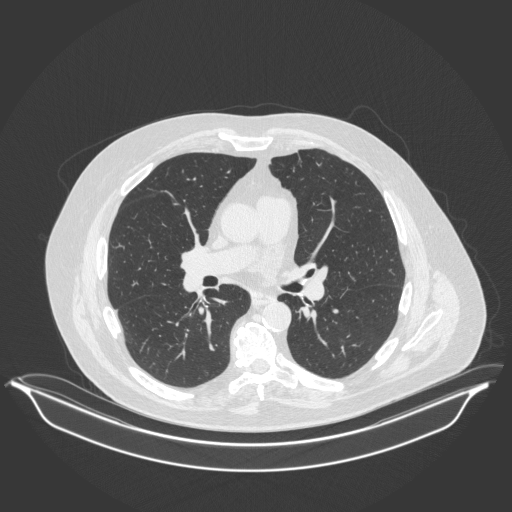
[im 282/447  lung]
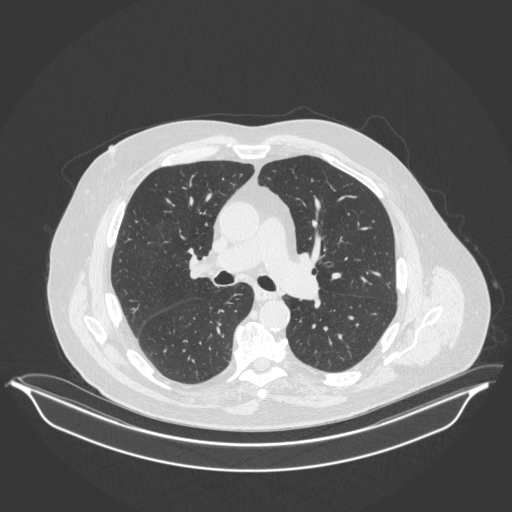
[im 306/447  lung]
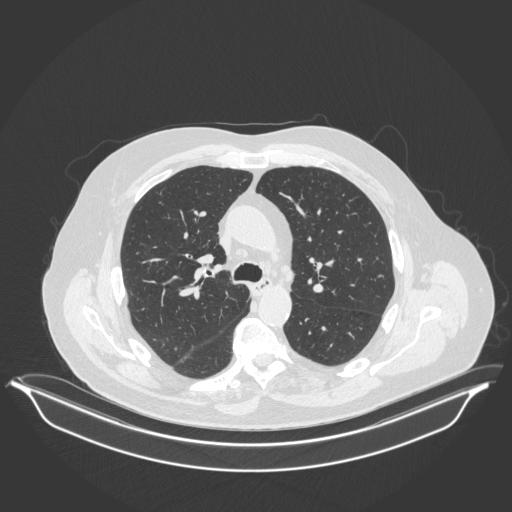
[im 329/447  lung]
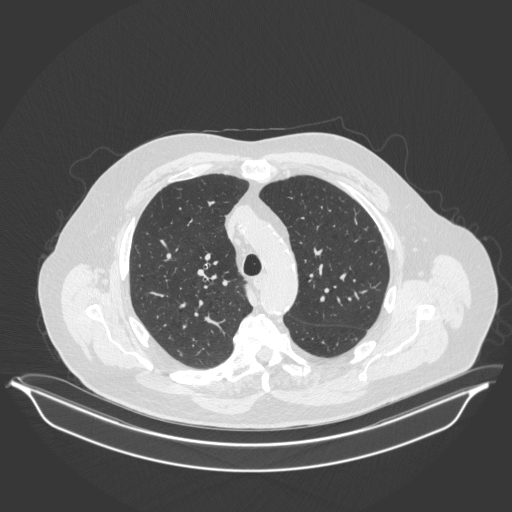
[im 353/447  mediastinal]
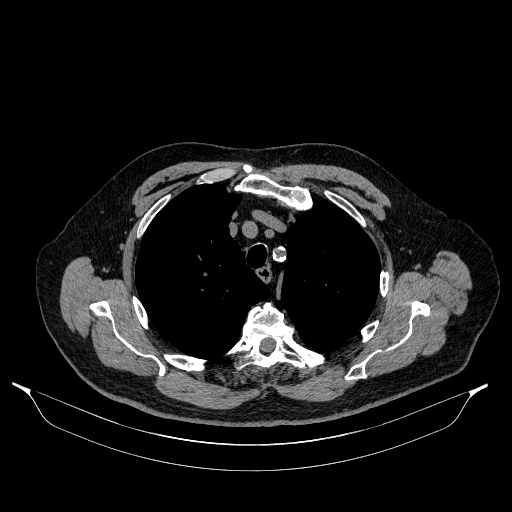
[im 353/447  lung]
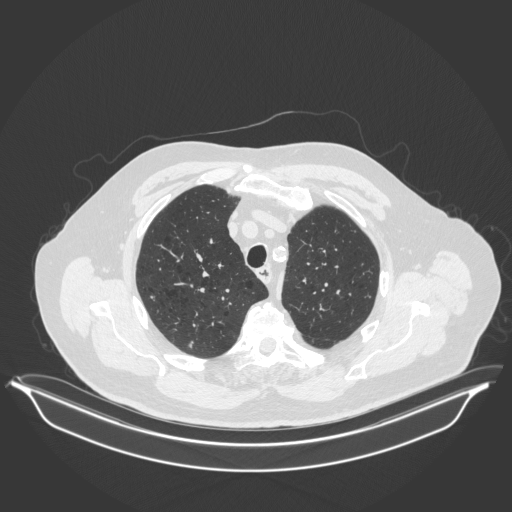
[im 400/447  lung]
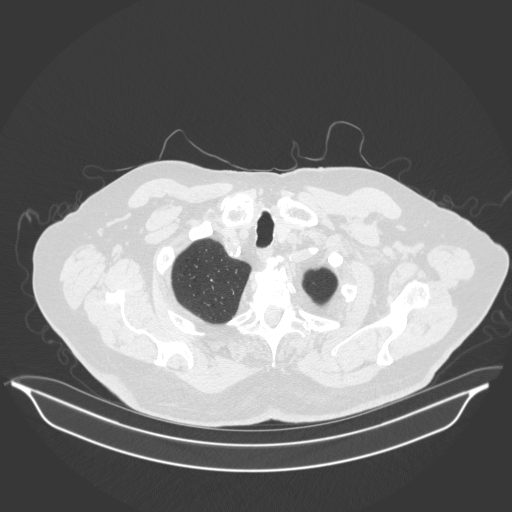
[im 423/447  lung]
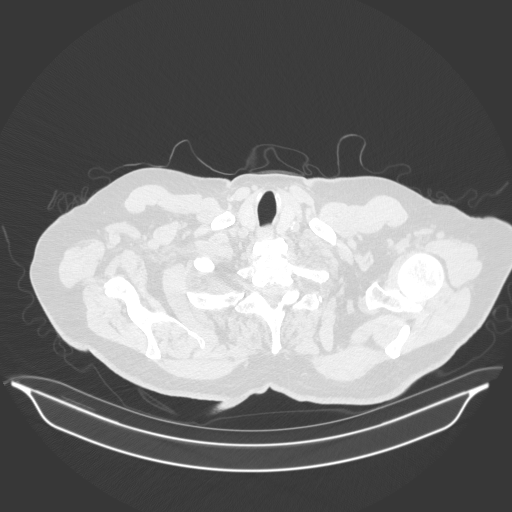

[15 of 31 positions shown; findings below may reference images not displayed]

FINDINGS: Cardiovascular: Calcified atheromatous plaque of the thoracic aorta
no aneurysmal dilation. Extensive 3 vessel coronary artery disease.
Normal heart size. Normal caliber of central pulmonary vessels.
Limited assessment of cardiovascular structures given lack of
intravenous contrast.

Mediastinum/Nodes: No gross hilar adenopathy. No mediastinal or
thoracic inlet adenopathy. No axillary lymphadenopathy.

Lungs/Pleura: Pulmonary emphysema and scattered tree-in-bud
nodularity throughout the chest. Basilar atelectasis. No effusion.
No consolidation. Airways are patent.

RIGHT upper lobe pulmonary nodule increasing in size (image 60/3) 13
x 9 mm as compared to 11 by 5 mm.

Scattered smaller nodules and aforementioned tree-in-bud nodularity
without change.

Upper Abdomen: Imaged portions the liver, gallbladder, pancreas,
spleen, adrenal glands, kidneys and gastrointestinal tract without
acute process. Colonic diverticulosis. Limited imaging of the upper
abdomen.

Musculoskeletal: No acute bone finding. No destructive bone process.
Spinal degenerative changes.
IMPRESSION: 1. RIGHT upper lobe pulmonary nodule increasing in size 13 x 9 mm as
compared to 11 by 5 mm. Continued enlargement suspicious for small
bronchogenic neoplasm referral to multi disciplinary thoracic
oncologic setting with PET or biopsy as warranted is suggested.
2. Scattered smaller nodules and aforementioned tree-in-bud
nodularity without change representing a background of chronic post
infectious or inflammatory changes. Attention on follow-up.
3. Pulmonary emphysema and aortic atherosclerosis.
4. Colonic diverticulosis.

Aortic Atherosclerosis ([V9]-[V9]) and Emphysema ([V9]-[V9]).

## 2021-08-07 ENCOUNTER — Telehealth: Payer: Self-pay | Admitting: Internal Medicine

## 2021-08-07 DIAGNOSIS — R918 Other nonspecific abnormal finding of lung field: Secondary | ICD-10-CM

## 2021-08-07 NOTE — Telephone Encounter (Signed)
CT chest- there is a growing nodule of concern in the upper right lung. We need to evaluate this with a PET scan, which will also check to make sure there are no others like it. Then I will ask one of the specialists in my group to consider a biopsy. ? ?Patient is aware of results and voiced his understanding.  ?He agrees with PET. PET has been ordered. ?Nothing further needed.  ? ?

## 2021-08-07 NOTE — Telephone Encounter (Signed)
Patient called regarding his CT scan results- wanted to make sure Dr. Annamaria Boots was aware of the growth. Informed patient Dr. Annamaria Boots has reviewed them and a nurse will be reaching out to explain. Patient verbalized understanding. ? ? ?

## 2021-08-08 ENCOUNTER — Other Ambulatory Visit: Payer: Self-pay

## 2021-08-15 ENCOUNTER — Telehealth: Payer: Self-pay | Admitting: Internal Medicine

## 2021-08-15 MED ORDER — LEVALBUTEROL HCL 0.63 MG/3ML IN NEBU: INHALATION_SOLUTION | RESPIRATORY_TRACT | 3 refills | Status: AC

## 2021-08-15 NOTE — Telephone Encounter (Signed)
Called and spoke with patient, verified that he needs his levalbuterol nebulizer solution refilled and verified his pharmacy.  He states he has a box and a half that have expired and requested that we send less of the medication.  I advised him I would send less medication with refills.  He also stated he is having issues with the pollen and has never had this problem before.  He states he is having a running nose and watery eyes.  I offered to schedule him an appointment with Dr. Annamaria Boots, however he declined. He said he has a PET scan scheduled for 4/19 and would prefer to wait until after that is completed to f/u with Dr. Annamaria Boots.  I advised him to call us back if he changes his mind or symptoms get worse.  He verbalized understanding.  Script sent.  Nothing further needed. ?

## 2021-08-22 DIAGNOSIS — J342 Deviated nasal septum: Secondary | ICD-10-CM | POA: Diagnosis not present

## 2021-08-22 DIAGNOSIS — D140 Benign neoplasm of middle ear, nasal cavity and accessory sinuses: Secondary | ICD-10-CM | POA: Diagnosis not present

## 2021-08-22 DIAGNOSIS — J309 Allergic rhinitis, unspecified: Secondary | ICD-10-CM | POA: Diagnosis not present

## 2021-08-22 DIAGNOSIS — H903 Sensorineural hearing loss, bilateral: Secondary | ICD-10-CM | POA: Diagnosis not present

## 2021-08-22 DIAGNOSIS — J343 Hypertrophy of nasal turbinates: Secondary | ICD-10-CM | POA: Diagnosis not present

## 2021-08-23 ENCOUNTER — Other Ambulatory Visit: Payer: Self-pay | Admitting: Cardiology

## 2021-08-23 DIAGNOSIS — I2510 Atherosclerotic heart disease of native coronary artery without angina pectoris: Secondary | ICD-10-CM

## 2021-08-29 ENCOUNTER — Ambulatory Visit (HOSPITAL_COMMUNITY)
Admission: RE | Admit: 2021-08-29 | Discharge: 2021-08-29 | Disposition: A | Discharge status: Home / Self Care | Payer: Medicare PPO | Source: Ambulatory Visit | Attending: Internal Medicine | Admitting: Internal Medicine

## 2021-08-29 DIAGNOSIS — I700 Atherosclerosis of aorta: Secondary | ICD-10-CM | POA: Insufficient documentation

## 2021-08-29 DIAGNOSIS — K5730 Diverticulosis of large intestine without perforation or abscess without bleeding: Secondary | ICD-10-CM | POA: Insufficient documentation

## 2021-08-29 DIAGNOSIS — R918 Other nonspecific abnormal finding of lung field: Secondary | ICD-10-CM | POA: Insufficient documentation

## 2021-08-29 LAB — GLUCOSE, CAPILLARY: Glucose-Capillary: 129 mg/dL — ABNORMAL HIGH (ref 70–99)

## 2021-08-29 IMAGING — CT NM PET TUM IMG INITIAL (PI) SKULL BASE T - THIGH
1 of 7 series · 1 of 25 positions shown · non-contrast
Comparison: Chest CT of [DATE] and multiple prior exams.

CLINICAL DATA: Initial treatment strategy for pulmonary nodule in a
80-year-old male with interval increase in size over time.

EXAM:
NUCLEAR MEDICINE PET SKULL BASE TO THIGH
TECHNIQUE: 9.6 mCi F-18 FDG was injected intravenously. Full-ring PET imaging
was performed from the skull base to thigh after the radiotracer. CT
data was obtained and used for attenuation correction and anatomic
localization.
Fasting blood glucose: 129 mg/dl

[Series 4: ct sk_thigh 5.0 br38 · axial · 5.0mm · 0.98mm/px · 1 of 221 slices shown]
[im 221/221  brain]
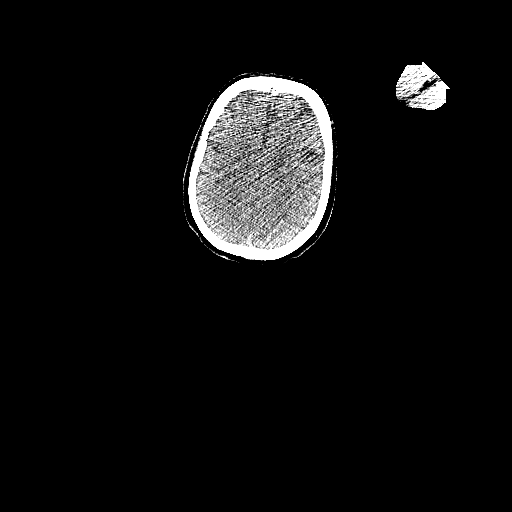

[1 of 25 positions shown; findings below may reference images not displayed]

FINDINGS: Mediastinal blood pool activity: SUV max

Liver activity: SUV max NA

NECK: No hypermetabolic lymph nodes in the neck.

Incidental CT findings: RIGHT maxillary sinus disease. Partial
opacification of the RIGHT maxillary sinus without increased
metabolic activity. No adenopathy by size criteria in the neck.

CHEST: Nodule of concern in the RIGHT upper lobe (image [DATE])
approximately 13 mm showing a maximum SUV of 4.90. Nodule is not
associated with hypermetabolic lymph nodes in the chest. No
additional areas of increased metabolic activity in the chest.

Incidental CT findings: Calcified atheromatous plaque in the
thoracic aorta. Three-vessel coronary artery disease. Normal heart
size without substantial pericardial effusion. Normal caliber of
central pulmonary vessels. Limited assessment of cardiovascular
structures given lack of intravenous contrast.

Basilar atelectasis.  Airways are patent.

ABDOMEN/PELVIS: No abnormal hypermetabolic activity within the
liver, pancreas, adrenal glands, or spleen. No hypermetabolic lymph
nodes in the abdomen or pelvis.

Incidental CT findings: No acute findings relative to liver,
gallbladder, pancreas, spleen, adrenal glands, kidneys, stomach,
small or large bowel. Pancolonic diverticulosis post partial colonic
resection in the area of the descending colon. Sigmoid diverticular
disease is moderate to marked.

Atherosclerotic changes of the abdominal aorta without aneurysm.
Urinary bladder with smooth contours. Fat containing bilateral
inguinal hernias.

SKELETON: No focal hypermetabolic activity to suggest skeletal
metastasis.

Incidental CT findings: Spinal degenerative changes. No acute or
destructive bone process.
IMPRESSION: Nodule of concern in the RIGHT upper lobe is hypermetabolic and
remains concerning for if not consistent with bronchogenic neoplasm.
No signs of adenopathy in the chest or signs of distant metastatic
disease.

Colonic diverticulosis, aortic atherosclerosis and other incidental
findings as outlined above.

Aortic Atherosclerosis ([NA]-[NA]).

## 2021-08-29 MED ORDER — FLUDEOXYGLUCOSE F - 18 (FDG) INJECTION
9.0000 | Freq: Once | INTRAVENOUS | Status: AC | PRN
Start: 2021-08-29 — End: 2021-08-29
  Administered 2021-08-29: 9.6 via INTRAVENOUS

## 2021-08-30 ENCOUNTER — Telehealth: Payer: Self-pay | Admitting: Internal Medicine

## 2021-08-30 NOTE — Telephone Encounter (Signed)
I called Glen [name] to let him know the PET scan shows the RUL nodule is hot and probably a cancer. I will be asking one of our lung nodule specialists to manage this. I indicated understanding.  ?

## 2021-09-03 ENCOUNTER — Encounter: Payer: Self-pay | Admitting: Pulmonary Disease

## 2021-09-03 ENCOUNTER — Ambulatory Visit: Payer: Medicare PPO | Admitting: Pulmonary Disease

## 2021-09-03 VITALS — BP 124/60 | HR 76 | Temp 98.7°F | Ht 67.0 in | Wt 197.0 lb

## 2021-09-03 DIAGNOSIS — R942 Abnormal results of pulmonary function studies: Secondary | ICD-10-CM

## 2021-09-03 DIAGNOSIS — R911 Solitary pulmonary nodule: Secondary | ICD-10-CM

## 2021-09-03 DIAGNOSIS — Z87891 Personal history of nicotine dependence: Secondary | ICD-10-CM

## 2021-09-03 NOTE — Patient Instructions (Signed)
Thank you for visiting Dr. Valeta Harms at [facility]. ?Today we recommend the following: ? ?Orders Placed This Encounter  ?Procedures  ? Procedural/ Surgical Case Request: ROBOTIC ASSISTED NAVIGATIONAL BRONCHOSCOPY  ? Ambulatory referral to Pulmonology  ? ?Tentative bronchoscopy date: 09/11/2021 ? ?Return in about 15 days (around 09/18/2021) for Eric Form, NP - Review bronchoscopy results . ? ? ? ?Please do your part to reduce the spread of COVID-19.  ? ?

## 2021-09-03 NOTE — Progress Notes (Signed)
? ?Synopsis: Referred in April 2023 for Lung nodule by [name], MD ? ?Subjective:  ? ?PATIENT ID: Glen [name] GENDER: male DOB: Nov 21, 1940, MRN: [id number] ? ?Chief Complaint  ?Patient presents with  ? New Patient (Initial Visit)  ?  New pt from Dr Annamaria Boots. Pt has COPD and had it for several years and a lung nodule.   ? ? ?This is an 81 year old gentleman, past medical history of colon cancer, COPD, gastroesophageal reflux, sleep apnea on CPAP.  Follows with Dr. Annamaria Boots in sleep clinic.CT imaging of the chest and abnormal PET scan.  Patient had a CT scan that was completed on 08/01/2021.  Patient was referred for abnormal he was found to have a new right upper lobe pulmonary nodule patient was sent for nuclear medicine PET scan on 08/29/2021.  This revealed a hypermetabolic uptake within the right upper lobe pulmonary nodule with no evidence of a distant metastatic disease.  Patient was referred today for evaluation of lung nodule and to discuss next best steps and consideration for bronchoscopy. ? ? ?Past Medical History:  ?Diagnosis Date  ? Allergic rhinitis, cause unspecified   ? Anxiety   ? Asthma   ? Cancer ([place]) 07/21/2018  ? colon  ? COPD (chronic obstructive pulmonary disease) ([place])   ? Depression   ? Dyspnea   ? upon exertion  ? GERD (gastroesophageal reflux disease)   ? Hypertension   ? PUD (peptic ulcer disease)   ? avoids aspirin  ? Sleep apnea   ? uses C-PAP  ?  ? ?Family History  ?Problem Relation Age of Onset  ? Alcohol abuse Father   ?     commited suicide  ? Heart disease Father   ?     had bypass  ? Breast cancer Mother   ? Prostate cancer Maternal Uncle   ?     several   ?  ? ?Past Surgical History:  ?Procedure Laterality Date  ? CATARACT EXTRACTION, BILATERAL    ? COLONOSCOPY    ? LAPAROSCOPIC LYSIS OF ADHESIONS N/A 07/21/2018  ? Procedure: Laparoscopic Lysis Of Adhesions;  Surgeon: Fanny Skates, MD;  Location: [facility];  Service: General;  Laterality: N/A;  ? LAPAROSCOPIC PARTIAL COLECTOMY  Left 07/21/2018  ? Procedure: LAPARASCOPIC TAKEDOWN OF SPLENIC FLEXURE; OPEN LEFT COLETCTOMY WITH PRIMARY ANASTOMOSIS;  Surgeon: Fanny Skates, MD;  Location: [facility];  Service: General;  Laterality: Left;  ? NASAL SEPTUM SURGERY  1960's  ? TONSILLECTOMY    ? UMBILICAL HERNIA REPAIR N/A 07/21/2018  ? Procedure: PRIMARY REPAIR OF UMBILICAL HERNIA , ADULT;  Surgeon: Fanny Skates, MD;  Location: [facility];  Service: General;  Laterality: N/A;  ? ? ?Social History  ? ?Socioeconomic History  ? Marital status: Married  ?  Spouse name: Not on file  ? Number of children: 0  ? Years of education: Not on file  ? Highest education level: Bachelor's degree (e.g., BA, AB, BS)  ?Occupational History  ? Occupation: retired Psychiatric nurse  ?Tobacco Use  ? Smoking status: Former  ?  Packs/day: 0.50  ?  Types: Cigarettes  ?  Quit date: 04/12/2011  ?  Years since quitting: 10.4  ? Smokeless tobacco: Never  ?Vaping Use  ? Vaping Use: Never used  ?Substance and Sexual Activity  ? Alcohol use: Yes  ?  Alcohol/week: 0.0 - 6.0 standard drinks  ?  Comment: 1 liquor drink, 1 glass wine daily  ? Drug use: No  ? Sexual activity: Not  on file  ?Other Topics Concern  ? Not on file  ?Social History Narrative  ? Left handed  ? One story home  ? Drinks caffeine  ? ?Social Determinants of Health  ? ?Financial Resource Strain: Not on file  ?Food Insecurity: Not on file  ?Transportation Needs: Not on file  ?Physical Activity: Not on file  ?Stress: Not on file  ?Social Connections: Not on file  ?Intimate Partner Violence: Not on file  ?  ? ?Allergies  ?Allergen Reactions  ? Aspirin Other (See Comments)  ?  pt has bleeding ulcers  ? Lisinopril Cough  ? Other   ?  Other reaction(s): GI Upset (intolerance) ?AVOID NSAIDS  ?  ? ?Outpatient Medications Prior to Visit  ?Medication Sig Dispense Refill  ? acetaminophen (TYLENOL) 650 MG CR tablet Take 1,950 mg by mouth at bedtime.    ? albuterol (PROAIR HFA) 108 (90 Base) MCG/ACT inhaler Inhale 2 puffs  every 6 hours as needed 8.5 g 12  ? ALPRAZolam (XANAX) 0.25 MG tablet Take 0.25 mg by mouth daily as needed for anxiety.     ? amLODipine (NORVASC) 10 MG tablet Take 10 mg by mouth every evening.     ? aspirin EC 81 MG tablet Take 81 mg by mouth at bedtime.     ? atorvastatin (LIPITOR) 40 MG tablet Take 40 mg by mouth at bedtime.     ? ezetimibe (ZETIA) 10 MG tablet TAKE 1 TABLET(10 MG) BY MOUTH DAILY 90 tablet 0  ? fluticasone (FLONASE) 50 MCG/ACT nasal spray Place into both nostrils daily. As needed    ? gabapentin (NEURONTIN) 100 MG capsule Take 1 capsule (100 mg total) by mouth every morning. 90 capsule 1  ? gabapentin (NEURONTIN) 300 MG capsule TAKE 2 CAPSULES BY MOUTH AT BEDTIME 180 capsule 0  ? hydrochlorothiazide (MICROZIDE) 12.5 MG capsule Take 12.5 mg by mouth daily.     ? levalbuterol (XOPENEX) 0.63 MG/3ML nebulizer solution USE 1 VIAL VIA NEBULIZER EVERY 4 HOURS AS NEEDED FOR WHEEZING OR SHORTNESS OF BREATH 1650 mL 1  ? levalbuterol (XOPENEX) 0.63 MG/3ML nebulizer solution Use 1 vial via nebulizer every 4 hours as needed for wheezing or shortness of breath. 425 mL 3  ? omeprazole (PRILOSEC) 20 MG capsule Take 20 mg by mouth daily.    ? primidone (MYSOLINE) 50 MG tablet TAKE 1 TABLET(50 MG) BY MOUTH TWICE DAILY 180 tablet 1  ? sertraline (ZOLOFT) 100 MG tablet Take 100 mg by mouth daily.   4  ? telmisartan (MICARDIS) 40 MG tablet Take 40 mg by mouth daily.    ? TRELEGY ELLIPTA 100-62.5-25 MCG/INH AEPB INHALE 1 PUFF INTO THE LUNGS DAILY 120 each 3  ? vitamin B-12 (CYANOCOBALAMIN) 1000 MCG tablet Take 1,000 mcg by mouth daily.    ? ?No facility-administered medications prior to visit.  ? ? ?Review of Systems  ?Constitutional:  Negative for chills, fever, malaise/fatigue and weight loss.  ?HENT:  Negative for hearing loss, sore throat and tinnitus.   ?Eyes:  Negative for blurred vision and double vision.  ?Respiratory:  Negative for cough, hemoptysis, sputum production, shortness of breath, wheezing and  stridor.   ?Cardiovascular:  Negative for chest pain, palpitations, orthopnea, leg swelling and PND.  ?Gastrointestinal:  Negative for abdominal pain, constipation, diarrhea, heartburn, nausea and vomiting.  ?Genitourinary:  Negative for dysuria, hematuria and urgency.  ?Musculoskeletal:  Negative for joint pain and myalgias.  ?Skin:  Negative for itching and rash.  ?Neurological:  Negative for  dizziness, tingling, weakness and headaches.  ?Endo/Heme/Allergies:  Negative for environmental allergies. Does not bruise/bleed easily.  ?Psychiatric/Behavioral:  Negative for depression. The patient is nervous/anxious. The patient does not have insomnia.   ?All other systems reviewed and are negative. ? ? ?Objective:  ?Physical Exam ?Vitals reviewed.  ?Constitutional:   ?   General: He is not in acute distress. ?   Appearance: He is well-developed. He is obese.  ?HENT:  ?   Head: Normocephalic and atraumatic.  ?Eyes:  ?   General: No scleral icterus. ?   Conjunctiva/sclera: Conjunctivae normal.  ?   Pupils: Pupils are equal, round, and reactive to light.  ?Neck:  ?   Vascular: No JVD.  ?   Trachea: No tracheal deviation.  ?Cardiovascular:  ?   Rate and Rhythm: Normal rate and regular rhythm.  ?   Heart sounds: Normal heart sounds. No murmur heard. ?Pulmonary:  ?   Effort: Pulmonary effort is normal. No tachypnea, accessory muscle usage or respiratory distress.  ?   Breath sounds: No stridor. No wheezing, rhonchi or rales.  ?Abdominal:  ?   General: There is no distension.  ?   Palpations: Abdomen is soft.  ?   Tenderness: There is no abdominal tenderness.  ?Musculoskeletal:     ?   General: No tenderness.  ?   Cervical back: Neck supple.  ?Lymphadenopathy:  ?   Cervical: No cervical adenopathy.  ?Skin: ?   General: Skin is warm and dry.  ?   Capillary Refill: Capillary refill takes less than 2 seconds.  ?   Findings: No rash.  ?Neurological:  ?   Mental Status: He is alert and oriented to person, place, and time.   ?Psychiatric:     ?   Behavior: Behavior normal.  ? ? ? ?Vitals:  ? 09/03/21 1354  ?BP: 124/60  ?Pulse: 76  ?Temp: 98.7 ?F (37.1 ?C)  ?TempSrc: Oral  ?SpO2: 95%  ?Weight: 197 lb (89.4 kg)  ?Height: 5\' 7"  (1.702 m)

## 2021-09-03 NOTE — H&P (View-Only) (Signed)
? ?Synopsis: Referred in April 2023 for Lung nodule by Glen Amel, MD ? ?Subjective:  ? ?PATIENT ID: Glen [name] GENDER: male DOB: [birth year], MRN: [id number] ? ?Chief Complaint  ?Patient presents with  ? New Patient (Initial Visit)  ?  New pt from Dr Annamaria Boots. Pt has COPD and had it for several years and a lung nodule.   ? ? ?This is an 81 year old gentleman, past medical history of colon cancer, COPD, gastroesophageal reflux, sleep apnea on CPAP.  Follows with Dr. Annamaria Boots in sleep clinic.CT imaging of the chest and abnormal PET scan.  Patient had a CT scan that was completed on 08/01/2021.  Patient was referred for abnormal he was found to have a new right upper lobe pulmonary nodule patient was sent for nuclear medicine PET scan on 08/29/2021.  This revealed a hypermetabolic uptake within the right upper lobe pulmonary nodule with no evidence of a distant metastatic disease.  Patient was referred today for evaluation of lung nodule and to discuss next best steps and consideration for bronchoscopy. ? ? ?Past Medical History:  ?Diagnosis Date  ? Allergic rhinitis, cause unspecified   ? Anxiety   ? Asthma   ? Cancer ([place]) 07/21/2018  ? colon  ? COPD (chronic obstructive pulmonary disease) ([place])   ? Depression   ? Dyspnea   ? upon exertion  ? GERD (gastroesophageal reflux disease)   ? Hypertension   ? PUD (peptic ulcer disease)   ? avoids aspirin  ? Sleep apnea   ? uses C-PAP  ?  ? ?Family History  ?Problem Relation Age of Onset  ? Alcohol abuse Father   ?     commited suicide  ? Heart disease Father   ?     had bypass  ? Breast cancer Mother   ? Prostate cancer Maternal Uncle   ?     several   ?  ? ?Past Surgical History:  ?Procedure Laterality Date  ? CATARACT EXTRACTION, BILATERAL    ? COLONOSCOPY    ? LAPAROSCOPIC LYSIS OF ADHESIONS N/A 07/21/2018  ? Procedure: Laparoscopic Lysis Of Adhesions;  Surgeon: Fanny Skates, MD;  Location: [place];  Service: General;  Laterality: N/A;  ? LAPAROSCOPIC PARTIAL COLECTOMY  Left 07/21/2018  ? Procedure: LAPARASCOPIC TAKEDOWN OF SPLENIC FLEXURE; OPEN LEFT COLETCTOMY WITH PRIMARY ANASTOMOSIS;  Surgeon: Fanny Skates, MD;  Location: [place];  Service: General;  Laterality: Left;  ? NASAL SEPTUM SURGERY  1960's  ? TONSILLECTOMY    ? UMBILICAL HERNIA REPAIR N/A 07/21/2018  ? Procedure: PRIMARY REPAIR OF UMBILICAL HERNIA , ADULT;  Surgeon: Fanny Skates, MD;  Location: [place];  Service: General;  Laterality: N/A;  ? ? ?Social History  ? ?Socioeconomic History  ? Marital status: Married  ?  Spouse name: Not on file  ? Number of children: 0  ? Years of education: Not on file  ? Highest education level: Bachelor's degree (e.g., BA, AB, BS)  ?Occupational History  ? Occupation: retired Psychiatric nurse  ?Tobacco Use  ? Smoking status: Former  ?  Packs/day: 0.50  ?  Types: Cigarettes  ?  Quit date: 04/12/2011  ?  Years since quitting: 10.4  ? Smokeless tobacco: Never  ?Vaping Use  ? Vaping Use: Never used  ?Substance and Sexual Activity  ? Alcohol use: Yes  ?  Alcohol/week: 0.0 - 6.0 standard drinks  ?  Comment: 1 liquor drink, 1 glass wine daily  ? Drug use: No  ? Sexual activity: Not  on file  ?Other Topics Concern  ? Not on file  ?Social History Narrative  ? Left handed  ? One story home  ? Drinks caffeine  ? ?Social Determinants of Health  ? ?Financial Resource Strain: Not on file  ?Food Insecurity: Not on file  ?Transportation Needs: Not on file  ?Physical Activity: Not on file  ?Stress: Not on file  ?Social Connections: Not on file  ?Intimate Partner Violence: Not on file  ?  ? ?Allergies  ?Allergen Reactions  ? Aspirin Other (See Comments)  ?  pt has bleeding ulcers  ? Lisinopril Cough  ? Other   ?  Other reaction(s): GI Upset (intolerance) ?AVOID NSAIDS  ?  ? ?Outpatient Medications Prior to Visit  ?Medication Sig Dispense Refill  ? acetaminophen (TYLENOL) 650 MG CR tablet Take 1,950 mg by mouth at bedtime.    ? albuterol (PROAIR HFA) 108 (90 Base) MCG/ACT inhaler Inhale 2 puffs  every 6 hours as needed 8.5 g 12  ? ALPRAZolam (XANAX) 0.25 MG tablet Take 0.25 mg by mouth daily as needed for anxiety.     ? amLODipine (NORVASC) 10 MG tablet Take 10 mg by mouth every evening.     ? aspirin EC 81 MG tablet Take 81 mg by mouth at bedtime.     ? atorvastatin (LIPITOR) 40 MG tablet Take 40 mg by mouth at bedtime.     ? ezetimibe (ZETIA) 10 MG tablet TAKE 1 TABLET(10 MG) BY MOUTH DAILY 90 tablet 0  ? fluticasone (FLONASE) 50 MCG/ACT nasal spray Place into both nostrils daily. As needed    ? gabapentin (NEURONTIN) 100 MG capsule Take 1 capsule (100 mg total) by mouth every morning. 90 capsule 1  ? gabapentin (NEURONTIN) 300 MG capsule TAKE 2 CAPSULES BY MOUTH AT BEDTIME 180 capsule 0  ? hydrochlorothiazide (MICROZIDE) 12.5 MG capsule Take 12.5 mg by mouth daily.     ? levalbuterol (XOPENEX) 0.63 MG/3ML nebulizer solution USE 1 VIAL VIA NEBULIZER EVERY 4 HOURS AS NEEDED FOR WHEEZING OR SHORTNESS OF BREATH 1650 mL 1  ? levalbuterol (XOPENEX) 0.63 MG/3ML nebulizer solution Use 1 vial via nebulizer every 4 hours as needed for wheezing or shortness of breath. 425 mL 3  ? omeprazole (PRILOSEC) 20 MG capsule Take 20 mg by mouth daily.    ? primidone (MYSOLINE) 50 MG tablet TAKE 1 TABLET(50 MG) BY MOUTH TWICE DAILY 180 tablet 1  ? sertraline (ZOLOFT) 100 MG tablet Take 100 mg by mouth daily.   4  ? telmisartan (MICARDIS) 40 MG tablet Take 40 mg by mouth daily.    ? TRELEGY ELLIPTA 100-62.5-25 MCG/INH AEPB INHALE 1 PUFF INTO THE LUNGS DAILY 120 each 3  ? vitamin B-12 (CYANOCOBALAMIN) 1000 MCG tablet Take 1,000 mcg by mouth daily.    ? ?No facility-administered medications prior to visit.  ? ? ?Review of Systems  ?Constitutional:  Negative for chills, fever, malaise/fatigue and weight loss.  ?HENT:  Negative for hearing loss, sore throat and tinnitus.   ?Eyes:  Negative for blurred vision and double vision.  ?Respiratory:  Negative for cough, hemoptysis, sputum production, shortness of breath, wheezing and  stridor.   ?Cardiovascular:  Negative for chest pain, palpitations, orthopnea, leg swelling and PND.  ?Gastrointestinal:  Negative for abdominal pain, constipation, diarrhea, heartburn, nausea and vomiting.  ?Genitourinary:  Negative for dysuria, hematuria and urgency.  ?Musculoskeletal:  Negative for joint pain and myalgias.  ?Skin:  Negative for itching and rash.  ?Neurological:  Negative for  dizziness, tingling, weakness and headaches.  ?Endo/Heme/Allergies:  Negative for environmental allergies. Does not bruise/bleed easily.  ?Psychiatric/Behavioral:  Negative for depression. The patient is nervous/anxious. The patient does not have insomnia.   ?All other systems reviewed and are negative. ? ? ?Objective:  ?Physical Exam ?Vitals reviewed.  ?Constitutional:   ?   General: He is not in acute distress. ?   Appearance: He is well-developed. He is obese.  ?HENT:  ?   Head: Normocephalic and atraumatic.  ?Eyes:  ?   General: No scleral icterus. ?   Conjunctiva/sclera: Conjunctivae normal.  ?   Pupils: Pupils are equal, round, and reactive to light.  ?Neck:  ?   Vascular: No JVD.  ?   Trachea: No tracheal deviation.  ?Cardiovascular:  ?   Rate and Rhythm: Normal rate and regular rhythm.  ?   Heart sounds: Normal heart sounds. No murmur heard. ?Pulmonary:  ?   Effort: Pulmonary effort is normal. No tachypnea, accessory muscle usage or respiratory distress.  ?   Breath sounds: No stridor. No wheezing, rhonchi or rales.  ?Abdominal:  ?   General: There is no distension.  ?   Palpations: Abdomen is soft.  ?   Tenderness: There is no abdominal tenderness.  ?Musculoskeletal:     ?   General: No tenderness.  ?   Cervical back: Neck supple.  ?Lymphadenopathy:  ?   Cervical: No cervical adenopathy.  ?Skin: ?   General: Skin is warm and dry.  ?   Capillary Refill: Capillary refill takes less than 2 seconds.  ?   Findings: No rash.  ?Neurological:  ?   Mental Status: He is alert and oriented to person, place, and time.   ?Psychiatric:     ?   Behavior: Behavior normal.  ? ? ? ?Vitals:  ? 09/03/21 1354  ?BP: 124/60  ?Pulse: 76  ?Temp: 98.7 ?F (37.1 ?C)  ?TempSrc: Oral  ?SpO2: 95%  ?Weight: 197 lb (89.4 kg)  ?Height: 5\' 7"  (1.702 m)

## 2021-09-06 ENCOUNTER — Other Ambulatory Visit: Payer: Self-pay | Admitting: Pulmonary Disease

## 2021-09-06 LAB — SARS CORONAVIRUS 2 (TAT 6-24 HRS): SARS Coronavirus 2: NEGATIVE

## 2021-09-10 ENCOUNTER — Other Ambulatory Visit: Payer: Self-pay

## 2021-09-10 ENCOUNTER — Encounter (HOSPITAL_COMMUNITY): Payer: Self-pay | Admitting: Pulmonary Disease

## 2021-09-10 NOTE — Anesthesia Preprocedure Evaluation (Addendum)
Anesthesia Evaluation  ?Patient identified by MRN, date of birth, ID band ?Patient awake ? ? ? ?Reviewed: ?Allergy & Precautions, NPO status , Patient's Chart, lab work & pertinent test results ? ?Airway ?Mallampati: II ? ?TM Distance: >3 FB ?Neck ROM: Full ? ? ? Dental ? ?(+) Dental Advisory Given ?  ?Pulmonary ?asthma , sleep apnea , COPD, former smoker,  ?  ?breath sounds clear to auscultation ? ? ? ? ? ? Cardiovascular ?hypertension, Pt. on medications ?+ CAD  ? ?Rhythm:Regular Rate:Normal ? ? ?  ?Neuro/Psych ?negative neurological ROS ?   ? GI/Hepatic ?Neg liver ROS, PUD, GERD  ,  ?Endo/Other  ?negative endocrine ROS ? Renal/GU ?negative Renal ROS  ? ?  ?Musculoskeletal ? ? Abdominal ?  ?Peds ? Hematology ?negative hematology ROS ?(+)   ?Anesthesia Other Findings ? ? Reproductive/Obstetrics ? ?  ? ? ? ? ? ? ? ? ? ? ? ? ? ?  ?  ? ? ? ? ? ? ? ?Anesthesia Physical ?Anesthesia Plan ? ?ASA: 3 ? ?Anesthesia Plan: General  ? ?Post-op Pain Management: Minimal or no pain anticipated  ? ?Induction: Intravenous ? ?PONV Risk Score and Plan: 2 and Dexamethasone, Ondansetron and Treatment may vary due to age or medical condition ? ?Airway Management Planned: Oral ETT ? ?Additional Equipment: None ? ?Intra-op Plan:  ? ?Post-operative Plan: Extubation in OR ? ?Informed Consent: I have reviewed the patients History and Physical, chart, labs and discussed the procedure including the risks, benefits and alternatives for the proposed anesthesia with the patient or authorized representative who has indicated his/her understanding and acceptance.  ? ? ? ?Dental advisory given ? ?Plan Discussed with: CRNA ? ?Anesthesia Plan Comments: (See APP note by Durel Salts, FNP )  ? ? ? ? ? ?Anesthesia Quick Evaluation ? ?

## 2021-09-10 NOTE — Pre-Procedure Instructions (Signed)
SDW CALL ? ?Patient was given pre-op instructions over the phone. The opportunity was given for the patient to ask questions. No further questions asked. Patient verbalized understanding of instructions given. ? ? ?PCP - Dorthy Cooler, [place] MD ?Cardiologist - Nigel Mormon, MD ? ?PPM/ICD - denies ?Chest x-ray - N/A ?EKG - 02/07/21 ?Stress Test - yes, normal per patient- unknown when this was done "years ago" ?ECHO - denies ?Cardiac Cath - denies ? ?Sleep Study - yes- sleep study  ?CPAP - pressure settings unknown by patient ? ?Fasting Blood Sugar - pt is pre-diabetic- does not check blood sugars at home ? ?Aspirin Instructions: pt states he was not told to stop taking his Aspirin by Dr. Valeta Harms. Pt educated to call office for instructions. Pt states he does not plan on taking his Aspirin in the morning.  ? ?ERAS Protcol - NPO ?COVID TEST- 09/06/21- will need repeat DOS ? ? ?Anesthesia review: see not from Willeen Cass, NP ? ?Patient denies shortness of breath, fever, cough and chest pain over the phone call ? ? ? ?Surgical Instructions ? ? ? Your procedure is scheduled on 09/11/21 ? Report to [facility] Main Entrance "A" at 11:30 A.M., then check in with the Admitting office. ? Call this number if you have problems the morning of surgery: ? [phone] ? ? ? Remember: ? Do not eat or drink after midnight the night before your surgery ? ? ? Take these medicines the morning of surgery with A SIP OF WATER:  ? ?Amlodipine (NORVASC) ?gabapentin (NEURONTIN)  ?omeprazole (PRILOSEC)  ?primidone (MYSOLINE) ?Sertraline (zoloft) ?TRELEGY ELLIPTA 100-62.5-25 MCG/INH  ?albuterol (PROAIR HFA) 108 (90 Base) MCG/ACT inhaler if needed ?fluticasone (FLONASE) 50 MCG/ACT nasal spray if needed ?levalbuterol (XOPENEX) 0.63 MG/3ML nebulizer solution if needed ? ?As of today, STOP taking any Aleve, Naproxen, Ibuprofen, Motrin, Advil, Goody's, BC's, all herbal medications, fish oil, and all vitamins. ? ?[facility] is not responsible for any  belongings or valuables.  ? ? ?If you use a CPAP at night, you may bring your mask for your overnight stay. ?  ?Contacts, glasses, hearing aids, dentures or partials may not be worn into surgery, please bring cases for these belongings ?  ?Patients discharged the day of surgery will not be allowed to drive home, and someone needs to stay with them for 24 hours. ? ? ?SURGICAL WAITING ROOM VISITATION ?No visitors are allowed in pre-op area with patient.  ?Patients having surgery or a procedure in a hospital may have two support people in the waiting room. ?Children under the age of 61 must have an adult with them who is not the patient. ?They may stay in the waiting area during the procedure and may switch out with other visitors. If the patient needs to stay at the hospital during part of their recovery, the visitor guidelines for inpatient rooms apply. ? ?Please refer to the [facility] website for the visitor guidelines for Inpatients (after your surgery is over and you are in a regular room).  ? ? ? ?Special instructions:   ? ?Oral Hygiene is also important to reduce your risk of infection.  Remember - BRUSH YOUR TEETH THE MORNING OF SURGERY WITH YOUR REGULAR TOOTHPASTE ? ? ?Day of Surgery: ? ?Take a shower the day of or night before with antibacterial soap. ?Wear Clean/Comfortable clothing the morning of surgery ?Do not apply any deodorants/lotions.   ?Do not wear jewelry or makeup ?Do not wear lotions, powders, perfumes/colognes, or deodorant. ?Do not shave  48 hours prior to surgery.  Men may shave face and neck. ?Do not bring valuables to the hospital. ?Do not wear nail polish, gel polish, artificial nails, or any other type of covering on natural nails (fingers and toes) ?If you have artificial nails or gel coating that need to be removed by a nail salon, please have this removed prior to surgery. Artificial nails or gel coating may interfere with anesthesia's ability to adequately monitor your vital  signs. ?Remember to brush your teeth WITH YOUR REGULAR TOOTHPASTE. ? ? ? ? ? ?

## 2021-09-10 NOTE — Progress Notes (Signed)
Anesthesia Chart Review: ? ?Pt is a same day work up  ? ? Case: [id number] Date/Time: 09/11/21 1430  ? Procedure: ROBOTIC ASSISTED NAVIGATIONAL BRONCHOSCOPY (Right) - ION w/ CIOS  ? Anesthesia type: General  ? Diagnosis: Right upper lobe pulmonary nodule [R91.1]  ? Pre-op diagnosis: Iung nodule  ? [facility] 3 / [facility]  ? Surgeons: Garner Nash, DO  ? ?  ? ? ?DISCUSSION: ?Pt is 81 years old with hx CAD (coronary calcifications on CT), HTN, COPD, asthma, OSA ? ? ?PROVIDERS: ?- PCP is [name], MD ?- Cardiologist is Vernell Leep, MD. Last office visit 05/09/21 ? ?- Vascular surgeon is Deitra Mayo, MD. Last office visit note 05/17/21 indicates pt is asymptomatic with moderate carotid artery disease and that CEA will not be considered unless stenosis is >80% ? ? ?LABS: Will be obtained day of surgery as needed.  ? ? ?IMAGES: ?CT chest 07/31/21:  ?1. RIGHT upper lobe pulmonary nodule increasing in size 13 x 9 mm as compared to 11 by 5 mm. Continued enlargement suspicious for small bronchogenic neoplasm referral to multi disciplinary thoracic oncologic setting with PET or biopsy as warranted is suggested. ?2. Scattered smaller nodules and aforementioned tree-in-bud nodularity without change representing a background of chronic post infectious or inflammatory changes. Attention on follow-up. ?3. Pulmonary emphysema and aortic atherosclerosis. ?4. Colonic diverticulosis. ?- Aortic Atherosclerosis and Emphysema  ? ? ?EKG 02/07/21:sinus rhythm ? ? ?CV: ?Carotid duplex 05/17/21:  ?- Right Carotid: Velocities in the right ICA are consistent with a 60-79% stenosis. Category of stenosis is the same as on the previous exam, however the velocities are less.  ?- Left Carotid: Velocities in the left ICA are consistent with a 1-39% stenosis.  ?- Vertebrals:  Bilateral vertebral arteries demonstrate antegrade flow.  ?- Subclavians: Normal flow hemodynamics were seen in bilateral subclavian  arteries.  ? ? ?Past Medical History:  ?Diagnosis Date  ? Allergic rhinitis, cause unspecified   ? Anxiety   ? Asthma   ? Cancer ([place]) 07/21/2018  ? colon  ? COPD (chronic obstructive pulmonary disease) ([place])   ? Depression   ? Dyspnea   ? upon exertion  ? GERD (gastroesophageal reflux disease)   ? Hypertension   ? PUD (peptic ulcer disease)   ? avoids aspirin  ? Sleep apnea   ? uses C-PAP  ? ? ?Past Surgical History:  ?Procedure Laterality Date  ? CATARACT EXTRACTION, BILATERAL    ? COLONOSCOPY    ? LAPAROSCOPIC LYSIS OF ADHESIONS N/A 07/21/2018  ? Procedure: Laparoscopic Lysis Of Adhesions;  Surgeon: Fanny Skates, MD;  Location: [place];  Service: General;  Laterality: N/A;  ? LAPAROSCOPIC PARTIAL COLECTOMY Left 07/21/2018  ? Procedure: LAPARASCOPIC TAKEDOWN OF SPLENIC FLEXURE; OPEN LEFT COLETCTOMY WITH PRIMARY ANASTOMOSIS;  Surgeon: Fanny Skates, MD;  Location: [place];  Service: General;  Laterality: Left;  ? NASAL SEPTUM SURGERY  1960's  ? TONSILLECTOMY    ? UMBILICAL HERNIA REPAIR N/A 07/21/2018  ? Procedure: PRIMARY REPAIR OF UMBILICAL HERNIA , ADULT;  Surgeon: Fanny Skates, MD;  Location: [facility];  Service: General;  Laterality: N/A;  ? ? ?MEDICATIONS: ?No current facility-administered medications for this encounter.  ? ? acetaminophen (TYLENOL) 650 MG CR tablet  ? albuterol (PROAIR HFA) 108 (90 Base) MCG/ACT inhaler  ? ALPRAZolam (XANAX) 0.25 MG tablet  ? amLODipine (NORVASC) 10 MG tablet  ? aspirin EC 81 MG tablet  ? atorvastatin (LIPITOR) 40 MG tablet  ? diphenhydrAMINE (  BENADRYL) 25 MG tablet  ? ezetimibe (ZETIA) 10 MG tablet  ? ferrous sulfate 325 (65 FE) MG tablet  ? fluticasone (FLONASE) 50 MCG/ACT nasal spray  ? gabapentin (NEURONTIN) 100 MG capsule  ? gabapentin (NEURONTIN) 300 MG capsule  ? hydrochlorothiazide (MICROZIDE) 12.5 MG capsule  ? levalbuterol (XOPENEX) 0.63 MG/3ML nebulizer solution  ? omeprazole (PRILOSEC) 20 MG capsule  ? primidone (MYSOLINE) 50 MG tablet  ? sertraline (ZOLOFT) 100 MG  tablet  ? telmisartan (MICARDIS) 40 MG tablet  ? TRELEGY ELLIPTA 100-62.5-25 MCG/INH AEPB  ? vitamin B-12 (CYANOCOBALAMIN) 1000 MCG tablet  ? ? ?If labs acceptable day of surgery, I anticipate pt can proceed with surgery as scheduled.  ? ?Willeen Cass, PhD, FNP-BC ?[facility]/Anesthesiology ?Phone: [phone] ?09/10/2021 9:20 AM  ? ? ? ? ? ? ?

## 2021-09-11 ENCOUNTER — Ambulatory Visit (HOSPITAL_BASED_OUTPATIENT_CLINIC_OR_DEPARTMENT_OTHER): Payer: Medicare PPO | Admitting: Emergency Medicine

## 2021-09-11 ENCOUNTER — Encounter (HOSPITAL_COMMUNITY): Payer: Self-pay | Admitting: Pulmonary Disease

## 2021-09-11 ENCOUNTER — Ambulatory Visit (HOSPITAL_COMMUNITY)
Admission: RE | Admit: 2021-09-11 | Discharge: 2021-09-11 | Disposition: A | Discharge status: Home / Self Care | Payer: Medicare PPO | Attending: Pulmonary Disease | Admitting: Pulmonary Disease

## 2021-09-11 ENCOUNTER — Ambulatory Visit (HOSPITAL_COMMUNITY): Payer: Medicare PPO

## 2021-09-11 ENCOUNTER — Encounter (HOSPITAL_COMMUNITY)
Admission: RE | Disposition: A | Discharge status: Home / Self Care | Payer: Self-pay | Source: Home / Self Care | Attending: Pulmonary Disease

## 2021-09-11 ENCOUNTER — Ambulatory Visit (HOSPITAL_COMMUNITY): Payer: Medicare PPO | Admitting: Emergency Medicine

## 2021-09-11 DIAGNOSIS — R911 Solitary pulmonary nodule: Secondary | ICD-10-CM

## 2021-09-11 DIAGNOSIS — I10 Essential (primary) hypertension: Secondary | ICD-10-CM | POA: Diagnosis not present

## 2021-09-11 DIAGNOSIS — I2510 Atherosclerotic heart disease of native coronary artery without angina pectoris: Secondary | ICD-10-CM

## 2021-09-11 DIAGNOSIS — Z8711 Personal history of peptic ulcer disease: Secondary | ICD-10-CM | POA: Insufficient documentation

## 2021-09-11 DIAGNOSIS — K219 Gastro-esophageal reflux disease without esophagitis: Secondary | ICD-10-CM | POA: Insufficient documentation

## 2021-09-11 DIAGNOSIS — J449 Chronic obstructive pulmonary disease, unspecified: Secondary | ICD-10-CM | POA: Insufficient documentation

## 2021-09-11 DIAGNOSIS — R918 Other nonspecific abnormal finding of lung field: Secondary | ICD-10-CM | POA: Insufficient documentation

## 2021-09-11 DIAGNOSIS — Z87891 Personal history of nicotine dependence: Secondary | ICD-10-CM | POA: Insufficient documentation

## 2021-09-11 DIAGNOSIS — G4730 Sleep apnea, unspecified: Secondary | ICD-10-CM | POA: Diagnosis not present

## 2021-09-11 DIAGNOSIS — Z79899 Other long term (current) drug therapy: Secondary | ICD-10-CM | POA: Diagnosis not present

## 2021-09-11 HISTORY — PX: BRONCHIAL BRUSHINGS: SHX5108

## 2021-09-11 HISTORY — PX: VIDEO BRONCHOSCOPY WITH RADIAL ENDOBRONCHIAL ULTRASOUND: SHX6849

## 2021-09-11 HISTORY — DX: Prediabetes: R73.03

## 2021-09-11 HISTORY — PX: FIDUCIAL MARKER PLACEMENT: SHX6858

## 2021-09-11 HISTORY — PX: BRONCHIAL BIOPSY: SHX5109

## 2021-09-11 LAB — BASIC METABOLIC PANEL
Anion gap: 6 (ref 5–15)
BUN: 21 mg/dL (ref 8–23)
CO2: 26 mmol/L (ref 22–32)
Calcium: 9.5 mg/dL (ref 8.9–10.3)
Chloride: 105 mmol/L (ref 98–111)
Creatinine, Ser: 1.12 mg/dL (ref 0.61–1.24)
GFR, Estimated: >60 mL/min (ref >60)
Glucose, Bld: 121 mg/dL — ABNORMAL HIGH (ref 70–99)
Potassium: 4.5 mmol/L (ref 3.5–5.1)
Sodium: 137 mmol/L (ref 135–145)

## 2021-09-11 IMAGING — DX DG CHEST 1V PORT
1 series · 1 of 1 positions shown · non-contrast
Comparison: Radiograph [DATE], chest CT [DATE]

CLINICAL DATA: Status post bronchoscopy

EXAM:
PORTABLE CHEST 1 VIEW

[chest ap]
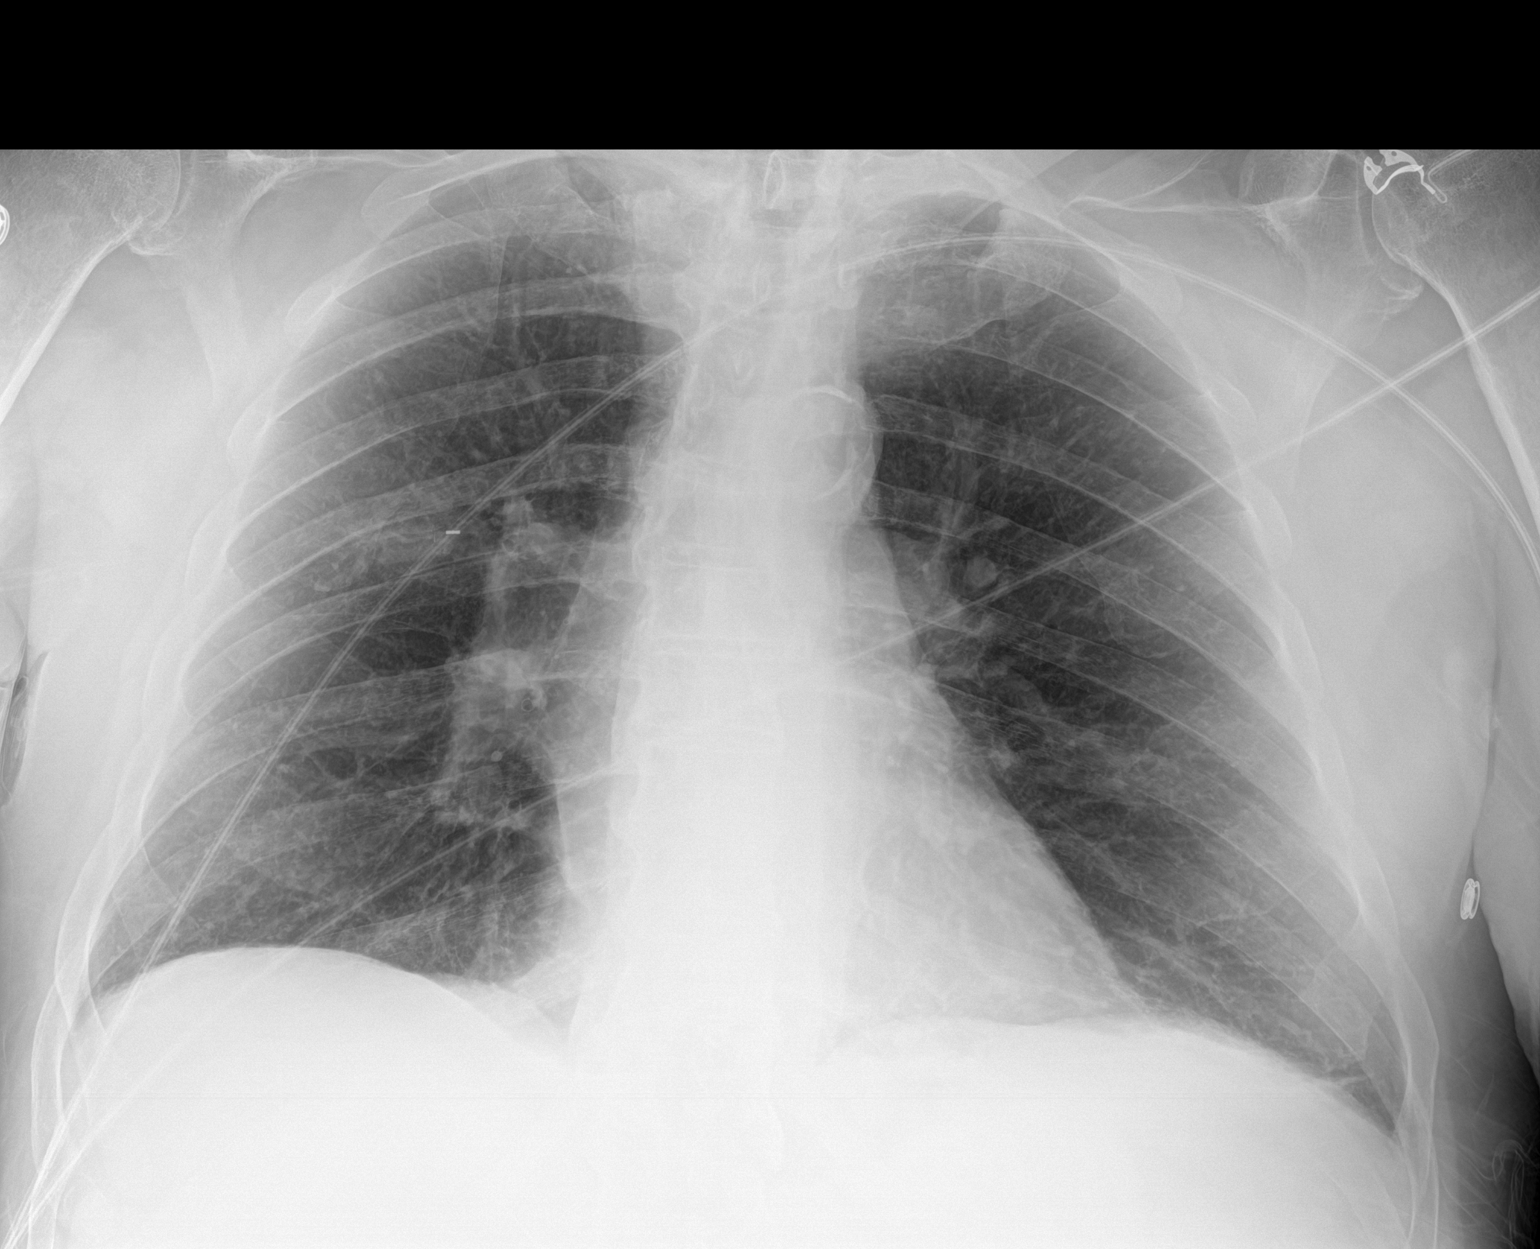

[1 of 1 positions shown; findings below may reference images not displayed]

FINDINGS: Unchanged cardiomediastinal silhouette. Faint opacification in the
right upper lobe in the region of known pulmonary nodule. No large
pleural effusion. No visible pneumothorax. No acute osseous
abnormality.
IMPRESSION: Faint opacity in the region of known right upper lobe pulmonary
nodule, consistent with post bronchoscopy/biopsy changes. No visible
pneumothorax.

## 2021-09-11 SURGERY — BRONCHOSCOPY, WITH BIOPSY USING ELECTROMAGNETIC NAVIGATION
Anesthesia: General | Laterality: Right

## 2021-09-11 MED ORDER — SUCCINYLCHOLINE CHLORIDE 200 MG/10ML IV SOSY
PREFILLED_SYRINGE | INTRAVENOUS | Status: DC | PRN
  Administered 2021-09-11: 100 mg via INTRAVENOUS

## 2021-09-11 MED ORDER — DEXAMETHASONE SODIUM PHOSPHATE 10 MG/ML IJ SOLN
INTRAMUSCULAR | Status: DC | PRN
Start: 2021-09-11 — End: 2021-09-11
  Administered 2021-09-11: 4 mg via INTRAVENOUS

## 2021-09-11 MED ORDER — ONDANSETRON HCL 4 MG/2ML IJ SOLN
INTRAMUSCULAR | Status: DC | PRN
  Administered 2021-09-11: 4 mg via INTRAVENOUS

## 2021-09-11 MED ORDER — SUGAMMADEX SODIUM 200 MG/2ML IV SOLN
INTRAVENOUS | Status: DC | PRN
Start: 2021-09-11 — End: 2021-09-11
  Administered 2021-09-11: 200 mg via INTRAVENOUS

## 2021-09-11 MED ORDER — PROPOFOL 10 MG/ML IV BOLUS
INTRAVENOUS | Status: DC | PRN
  Administered 2021-09-11: 150 mg via INTRAVENOUS

## 2021-09-11 MED ORDER — CHLORHEXIDINE GLUCONATE 0.12 % MT SOLN
15.0000 mL | Freq: Once | OROMUCOSAL | Status: AC
  Administered 2021-09-11: 15 mL via OROMUCOSAL
  Filled 2021-09-11: qty 15

## 2021-09-11 MED ORDER — EPHEDRINE SULFATE-NACL 50-0.9 MG/10ML-% IV SOSY
PREFILLED_SYRINGE | INTRAVENOUS | Status: DC | PRN
Start: 2021-09-11 — End: 2021-09-11
  Administered 2021-09-11 (×3): 5 mg via INTRAVENOUS

## 2021-09-11 MED ORDER — FENTANYL CITRATE (PF) 250 MCG/5ML IJ SOLN
INTRAMUSCULAR | Status: DC | PRN
  Administered 2021-09-11: 50 ug via INTRAVENOUS

## 2021-09-11 MED ORDER — LIDOCAINE 2% (20 MG/ML) 5 ML SYRINGE
INTRAMUSCULAR | Status: DC | PRN
  Administered 2021-09-11: 60 mg via INTRAVENOUS

## 2021-09-11 MED ORDER — LACTATED RINGERS IV SOLN: INTRAVENOUS | Status: DC

## 2021-09-11 MED ORDER — ROCURONIUM BROMIDE 10 MG/ML (PF) SYRINGE
PREFILLED_SYRINGE | INTRAVENOUS | Status: DC | PRN
Start: 2021-09-11 — End: 2021-09-11
  Administered 2021-09-11: 40 mg via INTRAVENOUS

## 2021-09-11 SURGICAL SUPPLY — 1 items: superlock fiducial marker ×1 IMPLANT

## 2021-09-11 NOTE — Discharge Instructions (Signed)
Flexible Bronchoscopy, Care After ?This sheet gives you information about how to care for yourself after your test. Your doctor may also give you more specific instructions. If you have problems or questions, contact your doctor. ?Follow these instructions at home: ?Eating and drinking ?Do not eat or drink anything (not even water) for 2 hours after your test, or until your numbing medicine (local anesthetic) wears off. ?When your numbness is gone and your cough and gag reflexes have come back, you may: ?Eat only soft foods. ?Slowly drink liquids. ?The day after the test, go back to your normal diet. ?Driving ?Do not drive for 24 hours if you were given a medicine to help you relax (sedative). ?Do not drive or use heavy machinery while taking prescription pain medicine. ?General instructions ? ?Take over-the-counter and prescription medicines only as told by your doctor. ?Return to your normal activities as told. Ask what activities are safe for you. ?Do not use any products that have nicotine or tobacco in them. This includes cigarettes and e-cigarettes. If you need help quitting, ask your doctor. ?Keep all follow-up visits as told by your doctor. This is important. It is very important if you had a tissue sample (biopsy) taken. ?Get help right away if: ?You have shortness of breath that gets worse. ?You get light-headed. ?You feel like you are going to pass out (faint). ?You have chest pain. ?You cough up: ?More than a little blood. ?More blood than before. ?Summary ?Do not eat or drink anything (not even water) for 2 hours after your test, or until your numbing medicine wears off. ?Do not use cigarettes. Do not use e-cigarettes. ?Get help right away if you have chest pain. ? ?This information is not intended to replace advice given to you by your health care provider. Make sure you discuss any questions you have with your health care provider. ?Document Released: 02/24/2009 Document Revised: 04/11/2017 Document  Reviewed: 05/17/2016 ?Elsevier Patient Education ? [place]. ? ?

## 2021-09-11 NOTE — Anesthesia Procedure Notes (Signed)
Procedure Name: Intubation ?Date/Time: 09/11/2021 2:09 PM ?Performed by: Reece Agar, CRNA ?Pre-anesthesia Checklist: Patient identified, Emergency Drugs available, Suction available and Patient being monitored ?Patient Re-evaluated:Patient Re-evaluated prior to induction ?Oxygen Delivery Method: Circle System Utilized ?Preoxygenation: Pre-oxygenation with 100% oxygen ?Induction Type: IV induction ?Ventilation: Mask ventilation without difficulty ?Laryngoscope Size: Mac and 4 ?Grade View: Grade I ?Tube type: Oral ?Number of attempts: 1 ?Airway Equipment and Method: Stylet ?Placement Confirmation: ETT inserted through vocal cords under direct vision, positive ETCO2 and breath sounds checked- equal and bilateral ?Secured at: 23 cm ?Tube secured with: Tape ?Dental Injury: Teeth and Oropharynx as per pre-operative assessment  ? ? ? ? ?

## 2021-09-11 NOTE — Progress Notes (Signed)
Patient had negative COVID test on 09/06/21, 5 days ago, 1 day outside testing window.  Dr. Valeta Harms made aware.  He does not want the patient retested. ?

## 2021-09-11 NOTE — Transfer of Care (Signed)
Immediate Anesthesia Transfer of Care Note ? ?Patient: Glen [name] [id number] ? ?Procedure(s) Performed: ROBOTIC ASSISTED NAVIGATIONAL BRONCHOSCOPY (Right) ?RADIAL ENDOBRONCHIAL ULTRASOUND ?BRONCHIAL BIOPSIES ?BRONCHIAL BRUSHINGS ?FIDUCIAL MARKER PLACEMENT ? ?Patient Location: PACU ? ?Anesthesia Type:General ? ?Level of Consciousness: awake, oriented and patient cooperative ? ?Airway & Oxygen Therapy: Patient Spontanous Breathing and Patient connected to nasal cannula oxygen ? ?Post-op Assessment: Report given to RN and Post -op Vital signs reviewed and stable ? ?Post vital signs: Reviewed ? ?Last Vitals:  ?Vitals Value Taken Time  ?BP    ?Temp    ?Pulse 68 09/11/21 1512  ?Resp 0 09/11/21 1512  ?SpO2 96 % 09/11/21 1512  ?Vitals shown include unvalidated device data. ? ?Last Pain:  ?Vitals:  ? 09/11/21 1158  ?TempSrc:   ?PainSc: 0-No pain  ?   ? ?  ? ?Complications: No notable events documented. ?

## 2021-09-11 NOTE — Op Note (Signed)
Video Bronchoscopy with Robotic Assisted Bronchoscopic Navigation  ? ?Date of Operation: 09/11/2021  ? ?Pre-op Diagnosis: RUL lung nodule  ? ?Post-op Diagnosis: RUL lung nodule  ? ?Surgeon: Bradley L Icard, DO ? ?Assistants: None  ? ?Anesthesia: General endotracheal anesthesia ? ?Operation: Flexible video fiberoptic bronchoscopy with robotic assistance and biopsies. ? ?Estimated Blood Loss: Minimal ? ?Complications: None ? ?Indications and History: ?Glen [name] is a 81 y.o. male with history of RUL lung nodule. The risks, benefits, complications, treatment options and expected outcomes were discussed with the patient.  The possibilities of pneumothorax, pneumonia, reaction to medication, pulmonary aspiration, perforation of a viscus, bleeding, failure to diagnose a condition and creating a complication requiring transfusion or operation were discussed with the patient who freely signed the consent.   ? ?Description of Procedure: ?The patient was seen in the Preoperative Area, was examined and was deemed appropriate to proceed.  The patient was taken to [facility] endoscopy room 3, identified as [place] [facility] and the procedure verified as Flexible Video Fiberoptic Bronchoscopy.  A Time Out was held and the above information confirmed.  ? ?Prior to the date of the procedure a high-resolution CT scan of the chest was performed. Utilizing ION software program a virtual tracheobronchial tree was generated to allow the creation of distinct navigation pathways to the patient's parenchymal abnormalities. After being taken to the operating room general anesthesia was initiated and the patient  was orally intubated. The video fiberoptic bronchoscope was introduced via the endotracheal tube and a general inspection was performed which showed normal right and left lung anatomy, aspiration of the bilateral mainstems was completed to remove any remaining secretions. Robotic catheter inserted into patient's endotracheal tube.   ? ?Target #1 RUL lung nodule: ?The distinct navigation pathways prepared prior to this procedure were then utilized to navigate to patient's lesion identified on CT scan. The robotic catheter was secured into place and the vision probe was withdrawn.  Lesion location was approximated using fluoroscopy and radial endobronchial ultrasound for peripheral targeting. Under fluoroscopic guidance transbronchial needle brushings, transbronchial needle biopsies, and transbronchial forceps biopsies were performed to be sent for cytology and pathology. Following tissue sampling a single fiducial was placed in proximity of the lesion using the fiducial catheter wire and delivery kit under direct fluoroscopy. ? ?At the end of the procedure a general airway inspection was performed and there was no evidence of active bleeding. The bronchoscope was removed.  The patient tolerated the procedure well. There was no significant blood loss and there were no obvious complications. A post-procedural chest x-ray is pending. ? ?Samples Target #1: ?1. Transbronchial needle brushings from right upper lobe ?2. Transbronchial forceps biopsies from right upper lobe ? ?Plans:  ?The patient will be discharged from the PACU to home when recovered from anesthesia and after chest x-ray is reviewed. We will review the cytology, pathology and microbiology results with the patient when they become available. Outpatient followup will be with Bradley L Icard, DO. ? ?Bradley L Icard, DO ?[facility] ?09/11/2021 3:27 PM    ? ?

## 2021-09-11 NOTE — Interval H&P Note (Signed)
History and Physical Interval Note: ? ?09/11/2021 ?1:57 PM ? ?Glen [name] [id number]  has presented today for surgery, with the diagnosis of Iung nodule.  The various methods of treatment have been discussed with the patient and family. After consideration of risks, benefits and other options for treatment, the patient has consented to  Procedure(s) with comments: ?ROBOTIC ASSISTED NAVIGATIONAL BRONCHOSCOPY (Right) - ION w/ CIOS as a surgical intervention.  The patient's history has been reviewed, patient examined, no change in status, stable for surgery.  I have reviewed the patient's chart and labs.  Questions were answered to the patient's satisfaction.   ? ? ?Glen [name] ? ? ?

## 2021-09-12 ENCOUNTER — Encounter (HOSPITAL_COMMUNITY): Payer: Self-pay | Admitting: Pulmonary Disease

## 2021-09-12 LAB — ACID FAST SMEAR (AFB, MYCOBACTERIA): Acid Fast Smear: NEGATIVE

## 2021-09-12 NOTE — Anesthesia Postprocedure Evaluation (Signed)
Anesthesia Post Note ? ?Patient: Glen [name] CLEXNTZ ? ?Procedure(s) Performed: ROBOTIC ASSISTED NAVIGATIONAL BRONCHOSCOPY (Right) ?RADIAL ENDOBRONCHIAL ULTRASOUND ?BRONCHIAL BIOPSIES ?BRONCHIAL BRUSHINGS ?FIDUCIAL MARKER PLACEMENT ? ?  ? ?Patient location during evaluation: PACU ?Anesthesia Type: General ?Level of consciousness: awake and alert ?Pain management: pain level controlled ?Vital Signs Assessment: post-procedure vital signs reviewed and stable ?Respiratory status: spontaneous breathing, nonlabored ventilation, respiratory function stable and patient connected to nasal cannula oxygen ?Cardiovascular status: blood pressure returned to baseline and stable ?Postop Assessment: no apparent nausea or vomiting ?Anesthetic complications: no ? ? ?No notable events documented. ? ?Last Vitals:  ?Vitals:  ? 09/11/21 1530 09/11/21 1540  ?BP: (!) 132/44 (!) 128/48  ?Pulse: 67 63  ?Resp: 14 10  ?Temp:    ?SpO2: 95% 96%  ?  ?Last Pain:  ?Vitals:  ? 09/11/21 1540  ?TempSrc:   ?PainSc: 0-No pain  ? ? ?  ?  ?  ?  ?  ?  ? ?Suzette Battiest E ? ? ? ? ?

## 2021-09-14 LAB — CYTOLOGY - NON PAP

## 2021-09-17 LAB — AEROBIC/ANAEROBIC CULTURE W GRAM STAIN (SURGICAL/DEEP WOUND)
Culture: NO GROWTH
Gram Stain: NONE SEEN

## 2021-09-17 NOTE — Progress Notes (Addendum)
? ?History of Present Illness ?Glen [name] is a 81 y.o. male with past medical history of colon cancer, COPD, gastroesophageal reflux, sleep apnea on CPAP.  Follows with Dr. Annamaria Boots in sleep clinic. ?Pt. Had a CT imaging of the chest and abnormal PET scan.  Patient had a CT scan that was completed on 08/01/2021.  Patient was referred for abnormal he was found to have a new right upper lobe pulmonary nodule patient was sent for nuclear medicine PET scan on 08/29/2021.  This revealed a hypermetabolic uptake within the right upper lobe pulmonary nodule with no evidence of a distant metastatic disease.  Patient was referred to pulmonary for evaluation of lung nodule and to discuss next best steps and consideration for bronchoscopy.He underwent Flexible video fiberoptic bronchoscopy with robotic assistance and biopsies on 09/11/2021 by Dr. Valeta Harms. ? ? ? ?09/18/2021 ?Pt. Presents for follow up with his wife.. He states he has been doing well after his bronchoscopy. He was coughing up bloody secretions until Saturday. They have subsequently cleared. He did have a sore throat and some calf pain, but both have self resolved. We discussed that his biopsy results were not definitive. We discussed that there were some atypical cells in the brushing , which are not normal cells and require cautious attention. We discussed the option of waiting 3 months to re-evaluate the nodule with CT imaging, but the nodule had grown at the previous 3 month interval scan , and was PET Avid on PET done 08/2021.  We discussed presenting his case at  the thoracic conference this Thursday to see if radiation oncology will consider radiation without definitive tissue diagnosis. I also will message Dr. Tammi Klippel to see if he will review the case and give consideration to radiation therapy without definitive diagnosis.  Pt. Is otherwise fully recovered from his procedure and has been given precautions for return to clinic or to seek emergency care.    ? ? ?Test Results: ?Cytology 09/11/2021 ?CYTOLOGY - NON PAP  ?CASE: [id number]  ?PATIENT: Glen [name]  ?FINAL MICROSCOPIC DIAGNOSIS:  ?A. LUNG, RUL, BRUSH:  ?- Atypical large cells suspicious for tumor (see comment)  ?- Background benign bronchial cells, pulmonary macrophages and acute  ?inflammatory cells  ? ?B. LUNG, RUL, BIOPSIES:  ?- No malignant cells identified  ?- Numerous pulmonary macrophages and fragments of alveolar laded lung  ?showing chronic inflammation and a poorly formed granuloma (see comment) ? ? ?PET scan 08/29/2021 ?Nodule of concern in the RIGHT upper lobe is hypermetabolic and ?remains concerning for if not consistent with bronchogenic neoplasm. ?No signs of adenopathy in the chest or signs of distant metastatic ?disease. ? ?CT Chest 07/31/2021 ?RIGHT upper lobe pulmonary nodule increasing in size 13 x 9 mm as ?compared to 11 by 5 mm. Continued enlargement suspicious for small ?bronchogenic neoplasm referral to multi disciplinary thoracic ?oncologic setting with PET or biopsy as warranted is suggested. ?2. Scattered smaller nodules and aforementioned tree-in-bud ?nodularity without change representing a background of chronic post ?infectious or inflammatory changes. Attention on follow-up. ?3. Pulmonary emphysema and aortic atherosclerosis. ?4. Colonic diverticulosis. ? ? ?  Latest Ref Rng & Units 07/25/2018  ?  3:30 AM 07/23/2018  ?  2:25 AM 07/22/2018  ?  3:32 AM  ?CBC  ?WBC 4.0 - 10.5 K/uL 5.4   10.2   13.1    ?Hemoglobin 13.0 - 17.0 g/dL 8.9   9.6   11.7    ?Hematocrit 39.0 - 52.0 % 27.7   30.8  35.5    ?Platelets 150 - 400 K/uL 245   232   293    ? ? ? ?  Latest Ref Rng & Units 09/11/2021  ? 11:43 AM 07/25/2018  ?  3:30 AM 07/23/2018  ?  2:25 AM  ?BMP  ?Glucose 70 - 99 mg/dL 121   107   102    ?BUN 8 - 23 mg/dL 21   <5   17    ?Creatinine 0.61 - 1.24 mg/dL 1.12   0.81   1.38    ?Sodium 135 - 145 mmol/L 137   141   139    ?Potassium 3.5 - 5.1 mmol/L 4.5   3.3   3.6    ?Chloride 98 - 111  mmol/L 105   105   107    ?CO2 22 - 32 mmol/L 26   29   26     ?Calcium 8.9 - 10.3 mg/dL 9.5   8.1   8.0    ? ? ?BNP ?No results found for: BNP ? ?ProBNP ?No results found for: PROBNP ? ?PFT ?No results found for: FEV1PRE, FEV1POST, FVCPRE, FVCPOST, TLC, DLCOUNC, PREFEV1FVCRT, PSTFEV1FVCRT ? ?NM PET Image Initial (PI) Skull Base To Thigh (F-18 FDG) ? ?Result Date: 08/29/2021 ?CLINICAL DATA:  Initial treatment strategy for pulmonary nodule in a 81 year old male with interval increase in size over time. EXAM: NUCLEAR MEDICINE PET SKULL BASE TO THIGH TECHNIQUE: 9.6 mCi F-18 FDG was injected intravenously. Full-ring PET imaging was performed from the skull base to thigh after the radiotracer. CT data was obtained and used for attenuation correction and anatomic localization. Fasting blood glucose: 129 mg/dl COMPARISON:  Chest CT of July 31, 2021 and multiple prior exams. FINDINGS: Mediastinal blood pool activity: SUV max 2.93 Liver activity: SUV max NA NECK: No hypermetabolic lymph nodes in the neck. Incidental CT findings: RIGHT maxillary sinus disease. Partial opacification of the RIGHT maxillary sinus without increased metabolic activity. No adenopathy by size criteria in the neck. CHEST: Nodule of concern in the RIGHT upper lobe (image 25/7) approximately 13 mm showing a maximum SUV of 4.90. Nodule is not associated with hypermetabolic lymph nodes in the chest. No additional areas of increased metabolic activity in the chest. Incidental CT findings: Calcified atheromatous plaque in the thoracic aorta. Three-vessel coronary artery disease. Normal heart size without substantial pericardial effusion. Normal caliber of central pulmonary vessels. Limited assessment of cardiovascular structures given lack of intravenous contrast. Basilar atelectasis.  Airways are patent. ABDOMEN/PELVIS: No abnormal hypermetabolic activity within the liver, pancreas, adrenal glands, or spleen. No hypermetabolic lymph nodes in the abdomen or  pelvis. Incidental CT findings: No acute findings relative to liver, gallbladder, pancreas, spleen, adrenal glands, kidneys, stomach, small or large bowel. Pancolonic diverticulosis post partial colonic resection in the area of the descending colon. Sigmoid diverticular disease is moderate to marked. Atherosclerotic changes of the abdominal aorta without aneurysm. Urinary bladder with smooth contours. Fat containing bilateral inguinal hernias. SKELETON: No focal hypermetabolic activity to suggest skeletal metastasis. Incidental CT findings: Spinal degenerative changes. No acute or destructive bone process. IMPRESSION: Nodule of concern in the RIGHT upper lobe is hypermetabolic and remains concerning for if not consistent with bronchogenic neoplasm. No signs of adenopathy in the chest or signs of distant metastatic disease. Colonic diverticulosis, aortic atherosclerosis and other incidental findings as outlined above. Aortic Atherosclerosis (ICD10-I70.0). Electronically Signed   By: Zetta Bills M.D.   On: 08/29/2021 17:04  ? ?DG CHEST PORT 1 VIEW ? ?Result Date: 09/11/2021 ?  CLINICAL DATA:  Status post bronchoscopy EXAM: PORTABLE CHEST 1 VIEW COMPARISON:  Radiograph 01/25/2020, chest CT 07/31/2021 FINDINGS: Unchanged cardiomediastinal silhouette. Faint opacification in the right upper lobe in the region of known pulmonary nodule. No large pleural effusion. No visible pneumothorax. No acute osseous abnormality. IMPRESSION: Faint opacity in the region of known right upper lobe pulmonary nodule, consistent with post bronchoscopy/biopsy changes. No visible pneumothorax. Electronically Signed   By: Maurine Simmering M.D.   On: 09/11/2021 15:29  ? ?DG C-ARM BRONCHOSCOPY ? ?Result Date: 09/11/2021 ?C-ARM BRONCHOSCOPY: Fluoroscopy was utilized by the requesting physician.  No radiographic interpretation.   ? ? ?Past medical hx ?Past Medical History:  ?Diagnosis Date  ? Allergic rhinitis, cause unspecified   ? Anxiety   ? Asthma   ?  Cancer ([place]) 07/21/2018  ? colon  ? COPD (chronic obstructive pulmonary disease) ([place])   ? Depression   ? Dyspnea   ? upon exertion  ? GERD (gastroesophageal reflux disease)   ? Hypertension   ? Pre-diabetes

## 2021-09-18 ENCOUNTER — Ambulatory Visit: Payer: Medicare PPO | Admitting: Acute Care

## 2021-09-18 ENCOUNTER — Encounter: Payer: Self-pay | Admitting: Acute Care

## 2021-09-18 ENCOUNTER — Other Ambulatory Visit: Payer: Self-pay | Admitting: Acute Care

## 2021-09-18 VITALS — BP 116/62 | HR 74 | Temp 97.5°F | Ht 67.0 in | Wt 193.0 lb

## 2021-09-18 DIAGNOSIS — R911 Solitary pulmonary nodule: Secondary | ICD-10-CM

## 2021-09-18 DIAGNOSIS — J449 Chronic obstructive pulmonary disease, unspecified: Secondary | ICD-10-CM | POA: Diagnosis not present

## 2021-09-18 NOTE — Patient Instructions (Addendum)
It is good to see you today. ?Your biopsy was not definitive . ?We will have your case reviewed at Thoracic Conference this week.  ?Dr. Valeta Harms will call you with the results of that review.  ?Options are we will go ahead with radiation therapy vs waiting for 3 months to re-evaluate with CT Scan.  ?Call of you have any question. ?Call if you start coughing up blood.  ?Continue Trelegy 1 puff once daily as you have been doing. ?Rinse mouth after use. ?Xopenex nebs as needed foe break through shortness of breath or wheezing.  ?Please contact office for sooner follow up if symptoms do not improve or worsen or seek emergency care   ? ?

## 2021-09-19 ENCOUNTER — Inpatient Hospital Stay: Payer: Medicare PPO | Attending: Oncology | Admitting: Oncology

## 2021-09-19 ENCOUNTER — Telehealth: Payer: Self-pay

## 2021-09-19 ENCOUNTER — Inpatient Hospital Stay: Payer: Medicare PPO

## 2021-09-19 ENCOUNTER — Telehealth: Payer: Self-pay | Admitting: Acute Care

## 2021-09-19 VITALS — BP 124/60 | HR 76 | Temp 98.2°F | Resp 18 | Ht 67.0 in | Wt 194.0 lb

## 2021-09-19 DIAGNOSIS — G629 Polyneuropathy, unspecified: Secondary | ICD-10-CM | POA: Insufficient documentation

## 2021-09-19 DIAGNOSIS — E538 Deficiency of other specified B group vitamins: Secondary | ICD-10-CM | POA: Insufficient documentation

## 2021-09-19 DIAGNOSIS — C187 Malignant neoplasm of sigmoid colon: Secondary | ICD-10-CM | POA: Insufficient documentation

## 2021-09-19 DIAGNOSIS — I10 Essential (primary) hypertension: Secondary | ICD-10-CM | POA: Insufficient documentation

## 2021-09-19 DIAGNOSIS — R911 Solitary pulmonary nodule: Secondary | ICD-10-CM | POA: Diagnosis not present

## 2021-09-19 DIAGNOSIS — J449 Chronic obstructive pulmonary disease, unspecified: Secondary | ICD-10-CM | POA: Insufficient documentation

## 2021-09-19 DIAGNOSIS — C186 Malignant neoplasm of descending colon: Secondary | ICD-10-CM

## 2021-09-19 LAB — CEA (ACCESS): CEA (CHCC): 2.1 ng/mL (ref 0.00–5.00)

## 2021-09-19 NOTE — Telephone Encounter (Signed)
I called Glen [name] and explained that Dr. Valeta Harms and I had discussed his case last evening, and that we decided to reach out to Dr. Tammi Klippel to see of he would consider SBRT without definitive diagnosis to expedite an appointment, rather than waiting to discuss this at Thoracic Conference. . Dr. Tammi Klippel reviewed the case and has kindly agreed to discuss SBRT with Glen [name] and [name]. I explained this to Glen [name] this morning, and he is in agreement with the plan. He is scheduled to see Dr. Tammi Klippel 09/27/2021. Greatly appreciate radiation oncology seeing this kind patient.  ?

## 2021-09-19 NOTE — Progress Notes (Signed)
?[facility] ?OFFICE PROGRESS NOTE ? ? ?Diagnosis: Colon cancer ? ?INTERVAL HISTORY:  ? ?[name] returns as scheduled.  Good appetite.  No difficulty with bowel function.  He has chronic exertional dyspnea.  He has an enlarging lung nodule followed by pulmonary medicine.  The nodule was hypermetabolic on a PET.  He underwent a bronchoscopic biopsy by Dr. Valeta Harms 09/11/2021.  The pathology from a right upper lobe brushing revealed atypical large cells suspicious for tumor.  The transbronchial right upper lobe biopsy revealed no malignant cells. ? ? ? ?Objective: ? ?Vital signs in last 24 hours: ? ?Blood pressure 124/60, [name] 76, temperature 98.2 ?F (36.8 ?C), temperature source Oral, resp. rate 18, height 5' 7"  (1.702 m), weight 194 lb (88 kg), SpO2 100 %. ?  ? ?Lymphatics: No cervical, supraclavicular, axillary, or inguinal nodes ?Resp: Lungs with distant breath sounds, no respiratory distress ?Cardio: Regular rate and rhythm ?GI: No hepatosplenomegaly, no mass, nontender ?Vascular: No leg edema ? ?Lab Results: ? ?Lab Results  ?Component Value Date  ? WBC 5.4 07/25/2018  ? HGB 8.9 (L) 07/25/2018  ? HCT 27.7 (L) 07/25/2018  ? MCV 93.3 07/25/2018  ? PLT 245 07/25/2018  ? NEUTROABS 5.3 07/17/2018  ? ? ?CMP  ?Lab Results  ?Component Value Date  ? NA 137 09/11/2021  ? K 4.5 09/11/2021  ? CL 105 09/11/2021  ? CO2 26 09/11/2021  ? GLUCOSE 121 (H) 09/11/2021  ? BUN 21 09/11/2021  ? CREATININE 1.12 09/11/2021  ? CALCIUM 9.5 09/11/2021  ? PROT 6.0 (L) 07/17/2018  ? ALBUMIN 3.4 (L) 07/17/2018  ? AST 19 07/17/2018  ? ALT 14 07/17/2018  ? ALKPHOS 85 07/17/2018  ? BILITOT 0.6 07/17/2018  ? GFRNONAA >60 09/11/2021  ? GFRAA >60 07/25/2018  ? ? ?Lab Results  ?Component Value Date  ? CEA1 1.87 09/14/2020  ? CEA 2.10 09/19/2021  ? ? ?Lab Results  ?Component Value Date  ? LABPROT 97 04/03/2018  ? ? ?Imaging: ? ?No results found. ? ?Medications: I have reviewed the patient's current  medications. ? ? ?Assessment/Plan: ?Colon cancer  ?Colonoscopy by Dr. Oletta Lamas 06/22/2018-polypoid completely obstructing large mass was found in the proximal sigmoid colon.  Pathology showed fragments of ulcer and inflammatory debris; no viable tissue present.   ?CT abdomen/pelvis 06/30/2018-severe descending and sigmoid diverticulosis.  There was a focal dilatation of the mid descending colon to approximately 8.5 x 6.2 cm.  There was no overt exophytic mass or mesenteric lymphadenopathy.  No evidence of abdominal metastatic disease.   ?07/21/2018 open left colectomy with primary anastomosis, primary repair of umbilical hernia by Dr. Dalbert Batman.  Pathology showed invasive moderately differentiated adenocarcinoma with mucinous features measuring 7.5 cm.  There was circumferential involvement of the descending colon with luminal obstruction.  Carcinoma focally invaded into the pericolonic soft tissue.  Margins negative.  No lymphovascular or perineural invasion.  21 lymph nodes negative for cancer; loss of expression MLH1 and PMS2; MSI high.  MLH1 hyper methylation present ?Colonoscopy 05/27/2019-polyps removed from the sigmoid, descending, cecum, ascending, and transverse colon, nodular mucosa at the colonic anastomosis biopsied-benign ulcerated mucosa, polyps were tubular adenomas, mucosal prolapse polyps,  and a hyperplastic polyp ?Upper endoscopy 06/22/2018-esophageal mucosal changes suggestive of long segment Barrett's esophagus.  A few gastric polyps.  Normal examined duodenum.  GERD.  Stomach biopsy-fundic gland polyp.  Negative for dysplasia.  Esophagus biopsy-Barrett's mucosa.  Negative for dysplasia.   ?COPD ?Hypertension ?B12 deficiency ?Peripheral neuropathy ?Right lung nodule ?CT chest 07/31/2021-increased  size of a right upper lobe nodule compared to December 2022 ?PET 08/29/2021-hypermetabolic right upper lobe nodule, no adenopathy or evidence of distant metastatic disease ?Bronchoscopy biopsy of right upper lobe  nodule 09/11/2021-brushing with atypical large cells suspicious for tumor ? ? ? ? ?Disposition: ?[name] remains in clinical remission from colon cancer.  He has a hypermetabolic right lung nodule.  The lung nodule most likely represents an early stage non-small cell lung cancer.  A biopsy on 09/11/2021 was nondiagnostic.  He plans to discuss treatment options further with pulmonary medicine.  He is not a surgical candidate.  He may be a candidate for SBRT.  He plans to follow-up with pulmonary medicine and radiation oncology for management of the lung nodule.  I am available to answer questions and help with management of the lung nodule as needed.  I think it is very unlikely the lung nodule is related to the colon cancer from 2020. ? ?He will return for an office visit in 6 months. ? ?Betsy Coder, MD ? ?09/19/2021  ?10:06 AM ? ? ?

## 2021-09-19 NOTE — Telephone Encounter (Signed)
-----   Message from Ladell Pier, MD sent at 09/19/2021  3:34 PM EDT ----- ?Please call patient, the CEA is normal, follow-up as scheduled ? ?

## 2021-09-19 NOTE — Telephone Encounter (Signed)
Pt verbalized understanding.

## 2021-09-25 DIAGNOSIS — C3411 Malignant neoplasm of upper lobe, right bronchus or lung: Secondary | ICD-10-CM | POA: Diagnosis not present

## 2021-09-25 DIAGNOSIS — Z87891 Personal history of nicotine dependence: Secondary | ICD-10-CM | POA: Diagnosis not present

## 2021-09-25 NOTE — Progress Notes (Signed)
Thoracic Location of Tumor / Histology: Lung Nodule right upper lobe  Associated Dx:  Descending Colon Ca (06/22/2018)  Dr. Benay Spice   Biopsies:  DG CHEST PORT 09/11/2021  FINDINGS: Unchanged cardiomediastinal silhouette. Faint opacification in the right upper lobe in the region of known pulmonary nodule. No large pleural effusion. No visible pneumothorax. No acute osseous  abnormality.   IMPRESSION: Faint opacity in the region of known right upper lobe pulmonary nodule, consistent with post  bronchoscopy/biopsy changes. No visible pneumothorax.   NM PET 08/29/2021  IMPRESSION: Nodule of concern in the RIGHT upper lobe is hypermetabolic and remains concerning for if not consistent with bronchogenic neoplasm. No signs of adenopathy in the chest or signs of distant metastatic disease.   Colonic diverticulosis, aortic atherosclerosis and other incidental findings as outlined above.   Aortic Atherosclerosis (ICD10-I70.0).   Tobacco/Marijuana/Snuff/ETOH use: Quit tobacco use 03/2011, no drug use, and yes to alcohol.  Past/Anticipated interventions by cardiothoracic surgery, if any: 09/11/2021  ROBOTIC ASSISTED NAVIGATIONAL BRONCHOSCOPY  Past/Anticipated interventions by medical oncology, if any:  NA  Signs/Symptoms Weight changes, if any:  No Respiratory complaints, if any: SOB-COPD Hemoptysis, if any: After biopsy only Pain issues, if any:  0/10 at this time but has chronic issues that can cause discomfort. (Neuropathy and arthritis)  SAFETY ISSUES: Prior radiation?  No Pacemaker/ICD?  No Possible current pregnancy? Male Is the patient on methotrexate? No  Current Complaints / other details:   Need more information on treatment options.

## 2021-09-25 NOTE — Progress Notes (Signed)
  Radiation Oncology         [phone]) [phone] ________________________________  Name: Glen [name] MRN: [id number]  Date: 09/27/2021  DOB: [birth year]  STEREOTACTIC BODY RADIOTHERAPY SIMULATION AND TREATMENT PLANNING NOTE    ICD-10-CM   1. Putative stage I cancer of right upper lobe of lung (HCC)  C34.11       DIAGNOSIS:  81 yo man with putative clinical stage I non-small cell carcinoma of the right upper lung  NARRATIVE:  The patient was brought to the [facility].  Identity was confirmed.  All relevant records and images related to the planned course of therapy were reviewed.  The patient freely provided informed written consent to proceed with treatment after reviewing the details related to the planned course of therapy. The consent form was witnessed and verified by the simulation staff.  Then, the patient was set-up in a stable reproducible  supine position for radiation therapy.  A BodyFix immobilization pillow was fabricated for reproducible positioning.  Then I personally applied the abdominal compression paddle to limit respiratory excursion.  4D respiratoy motion management CT images were obtained.  Surface markings were placed.  The CT images were loaded into the planning software.  Then, using Cine, MIP, and standard views, the internal target volume (ITV) and planning target volumes (PTV) were delinieated, and avoidance structures were contoured.  Treatment planning then occurred.  The radiation prescription was entered and confirmed.  A total of two complex treatment devices were fabricated in the form of the BodyFix immobilization pillow and a neck accuform cushion.  I have requested : 3D Simulation  I have requested a DVH of the following structures: Heart, Lungs, Esophagus, Chest Wall, Brachial Plexus, Major Blood Vessels, and targets.  SPECIAL TREATMENT PROCEDURE:  The planned course of therapy using radiation constitutes a special treatment procedure. Special  care is required in the management of this patient for the following reasons. This treatment constitutes a Special Treatment Procedure for the following reason: [ High dose per fraction requiring special monitoring for increased toxicities of treatment including daily imaging..  The special nature of the planned course of radiotherapy will require increased physician supervision and oversight to ensure patient's safety with optimal treatment outcomes.  This requires extended time and effort.    RESPIRATORY MOTION MANAGEMENT SIMULATION:  In order to account for effect of respiratory motion on target structures and other organs in the planning and delivery of radiotherapy, this patient underwent respiratory motion management simulation.  To accomplish this, when the patient was brought to the CT simulation planning suite, 4D respiratoy motion management CT images were obtained.  The CT images were loaded into the planning software.  Then, using a variety of tools including Cine, MIP, and standard views, the target volume and planning target volumes (PTV) were delineated.  Avoidance structures were contoured.  Treatment planning then occurred.  Dose volume histograms were generated and reviewed for each of the requested structure.  The resulting plan was carefully reviewed and approved today.  PLAN:  The patient will receive 54 Gy in 3 fractions.  ________________________________  Sheral Apley Tammi Klippel, M.D.

## 2021-09-27 ENCOUNTER — Ambulatory Visit
Admission: RE | Admit: 2021-09-27 | Discharge: 2021-09-27 | Disposition: A | Discharge status: Home / Self Care | Payer: Medicare PPO | Source: Ambulatory Visit | Attending: Radiation Oncology | Admitting: Radiation Oncology

## 2021-09-27 ENCOUNTER — Other Ambulatory Visit: Payer: Self-pay

## 2021-09-27 ENCOUNTER — Encounter: Payer: Self-pay | Admitting: Radiation Oncology

## 2021-09-27 VITALS — BP 120/55 | HR 69 | Temp 97.6°F | Resp 18 | Ht 67.0 in | Wt 196.4 lb

## 2021-09-27 DIAGNOSIS — Z79899 Other long term (current) drug therapy: Secondary | ICD-10-CM | POA: Insufficient documentation

## 2021-09-27 DIAGNOSIS — I2510 Atherosclerotic heart disease of native coronary artery without angina pectoris: Secondary | ICD-10-CM | POA: Diagnosis not present

## 2021-09-27 DIAGNOSIS — K219 Gastro-esophageal reflux disease without esophagitis: Secondary | ICD-10-CM | POA: Diagnosis not present

## 2021-09-27 DIAGNOSIS — C3411 Malignant neoplasm of upper lobe, right bronchus or lung: Secondary | ICD-10-CM

## 2021-09-27 DIAGNOSIS — R911 Solitary pulmonary nodule: Secondary | ICD-10-CM | POA: Insufficient documentation

## 2021-09-27 DIAGNOSIS — K4020 Bilateral inguinal hernia, without obstruction or gangrene, not specified as recurrent: Secondary | ICD-10-CM | POA: Diagnosis not present

## 2021-09-27 DIAGNOSIS — G4730 Sleep apnea, unspecified: Secondary | ICD-10-CM | POA: Insufficient documentation

## 2021-09-27 DIAGNOSIS — K5730 Diverticulosis of large intestine without perforation or abscess without bleeding: Secondary | ICD-10-CM | POA: Diagnosis not present

## 2021-09-27 DIAGNOSIS — Z803 Family history of malignant neoplasm of breast: Secondary | ICD-10-CM | POA: Insufficient documentation

## 2021-09-27 DIAGNOSIS — J449 Chronic obstructive pulmonary disease, unspecified: Secondary | ICD-10-CM | POA: Diagnosis not present

## 2021-09-27 DIAGNOSIS — Z8042 Family history of malignant neoplasm of prostate: Secondary | ICD-10-CM | POA: Insufficient documentation

## 2021-09-27 DIAGNOSIS — I10 Essential (primary) hypertension: Secondary | ICD-10-CM | POA: Diagnosis not present

## 2021-09-27 DIAGNOSIS — Z87891 Personal history of nicotine dependence: Secondary | ICD-10-CM | POA: Diagnosis not present

## 2021-09-27 DIAGNOSIS — Z510 Encounter for antineoplastic radiation therapy: Secondary | ICD-10-CM | POA: Diagnosis not present

## 2021-09-27 DIAGNOSIS — Z8711 Personal history of peptic ulcer disease: Secondary | ICD-10-CM | POA: Diagnosis not present

## 2021-09-27 NOTE — Progress Notes (Signed)
Radiation Oncology         [phone] ________________________________  Initial outpatient Consultation  Name: Glen [name] MRN: [id number]  Date of Service: 09/27/2021 DOB: 07/04/1940  GY:[id number], Dibas, MD  Garner Nash, DO   REFERRING PHYSICIAN: Garner Nash, DO  DIAGNOSIS: 81 year old male putative, Stage IA, NSCLC in the RUL lung    ICD-10-CM   1. Putative stage I cancer of right upper lobe of lung (HCC)  C34.11       HISTORY OF PRESENT ILLNESS: Glen [name] is a 81 y.o. male seen at the request of Dr. Valeta Harms.  He is an established pulmonary patient, followed in the sleep clinic with Dr. Baird Lyons for obstructive sleep apnea and COPD.  He was found to have a new, 5 mm solid nodule in the right upper lobe lung on a CT chest scan in September 2022.  Repeat CT chest imaging in December 2022 showed increased size, up to 1.1 cm.  The decision at that time was to continue to monitor this nodule closely and a follow-up CT chest scan was performed on 07/31/2021 again showing continued interval enlargement, now measuring 13 x 9 mm, suspicious for a small bronchogenic neoplasm.  A PET scan was performed on 08/29/2021 for further evaluation and confirmed hypermetabolism in the RUL nodule but without any associated lymphadenopathy.    He met with Dr. Valeta Harms in consult on 09/03/2021 and was scheduled for robotic bronchoscopy for biopsy and fiducial marker placement.  This procedure was performed on 09/11/2021 with final pathology showing atypical, large cells, suspicious for tumor but no definitive malignancy.   He reviewed these results with Dr. Valeta Harms and has been kindly referred today to discuss the potential role of stereotactic body radiotherapy (SBRT) in the management of this putative, Stage IA, NSCLC.  PREVIOUS RADIATION THERAPY: No  PAST MEDICAL HISTORY:  His wife was previously treated here at [facility] for breast cancer and is doing well.  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 Past Medical History:  Diagnosis Date   Allergic rhinitis, cause unspecified    Anxiety    Asthma    Cancer ([place]) 07/21/2018   colon   COPD (chronic obstructive pulmonary disease) (HCC)    Depression    Dyspnea    upon exertion   GERD (gastroesophageal reflux disease)    Hypertension    Pre-diabetes    PUD (peptic ulcer disease)    avoids aspirin   Sleep apnea    uses C-PAP      PAST SURGICAL HISTORY: Past Surgical History:  Procedure Laterality Date   BRONCHIAL BIOPSY  09/11/2021   Procedure: BRONCHIAL BIOPSIES;  Surgeon: Garner Nash, DO;  Location: [facility];  Service: Pulmonary;;   BRONCHIAL BRUSHINGS  09/11/2021   Procedure: BRONCHIAL BRUSHINGS;  Surgeon: Garner Nash, DO;  Location: [facility];  Service: Pulmonary;;   CATARACT EXTRACTION, BILATERAL     COLONOSCOPY     FIDUCIAL MARKER PLACEMENT  09/11/2021   Procedure: FIDUCIAL MARKER PLACEMENT;  Surgeon: Garner Nash, DO;  Location: [facility];  Service: Pulmonary;;   LAPAROSCOPIC LYSIS OF ADHESIONS N/A 07/21/2018   Procedure: Laparoscopic Lysis Of Adhesions;  Surgeon: Fanny Skates, MD;  Location: [facility];  Service: General;  Laterality: N/A;   LAPAROSCOPIC PARTIAL COLECTOMY Left 07/21/2018   Procedure: LAPARASCOPIC TAKEDOWN OF SPLENIC FLEXURE; OPEN LEFT COLETCTOMY WITH PRIMARY ANASTOMOSIS;  Surgeon: Fanny Skates, MD;  Location: [facility];  Service: General;  Laterality: Left;  NASAL SEPTUM SURGERY  1960's   TONSILLECTOMY     UMBILICAL HERNIA REPAIR N/A 07/21/2018   Procedure: PRIMARY REPAIR OF UMBILICAL HERNIA , ADULT;  Surgeon: Fanny Skates, MD;  Location: [facility];  Service: General;  Laterality: N/A;   VIDEO BRONCHOSCOPY WITH RADIAL ENDOBRONCHIAL ULTRASOUND  09/11/2021   Procedure: RADIAL ENDOBRONCHIAL ULTRASOUND;  Surgeon: Garner Nash, DO;  Location: [facility];  Service: Pulmonary;;    FAMILY HISTORY:  Family History  Problem Relation Age of Onset   Alcohol abuse Father         commited suicide   Heart disease Father        had bypass   Breast cancer Mother    Prostate cancer Maternal Uncle        several     SOCIAL HISTORY:  Social History   Socioeconomic History   Marital status: Married    Spouse name: Not on file   Number of children: 0   Years of education: Not on file   Highest education level: Bachelor's degree (e.g., BA, AB, BS)  Occupational History   Occupation: retired Psychiatric nurse  Tobacco Use   Smoking status: Former    Packs/day: 0.50    Types: Cigarettes    Quit date: 04/12/2011    Years since quitting: 10.4   Smokeless tobacco: Never  Vaping Use   Vaping Use: Never used  Substance and Sexual Activity   Alcohol use: Yes    Alcohol/week: 0.0 - 6.0 standard drinks    Comment: 1 liquor drink, 1 glass wine eveyr 2-3 weeks   Drug use: No   Sexual activity: Not on file  Other Topics Concern   Not on file  Social History Narrative   Left handed   One story home   Drinks caffeine   Social Determinants of Health   Financial Resource Strain: Not on file  Food Insecurity: Not on file  Transportation Needs: Not on file  Physical Activity: Not on file  Stress: Not on file  Social Connections: Not on file  Intimate Partner Violence: Not on file    ALLERGIES: Aspirin, Lisinopril, and Other  MEDICATIONS:  Current Outpatient Medications  Medication Sig Dispense Refill   acetaminophen (TYLENOL) 650 MG CR tablet Take 1,950 mg by mouth at bedtime.     albuterol (PROAIR HFA) 108 (90 Base) MCG/ACT inhaler Inhale 2 puffs every 6 hours as needed (Patient taking differently: Inhale 2 puffs into the lungs every 6 (six) hours as needed for wheezing or shortness of breath. Inhale 2 puffs every 6 hours as needed) 8.5 g 12   ALPRAZolam (XANAX) 0.25 MG tablet Take 0.25 mg by mouth daily as needed for anxiety.      amLODipine (NORVASC) 10 MG tablet Take 10 mg by mouth daily.     aspirin EC 81 MG tablet Take 81 mg by mouth at  bedtime.      atorvastatin (LIPITOR) 40 MG tablet Take 40 mg by mouth at bedtime.      diphenhydrAMINE (BENADRYL) 25 MG tablet Take 50 mg by mouth at bedtime.     ezetimibe (ZETIA) 10 MG tablet TAKE 1 TABLET(10 MG) BY MOUTH DAILY 90 tablet 0   ferrous sulfate 325 (65 FE) MG tablet Take 325 mg by mouth daily.     fluticasone (FLONASE) 50 MCG/ACT nasal spray Place 2 sprays into both nostrils daily as needed for allergies.     gabapentin (NEURONTIN) 100 MG capsule Take 1 capsule (100 mg total)  by mouth every morning. 90 capsule 1   gabapentin (NEURONTIN) 300 MG capsule TAKE 2 CAPSULES BY MOUTH AT BEDTIME 180 capsule 0   hydrochlorothiazide (MICROZIDE) 12.5 MG capsule Take 12.5 mg by mouth daily.      levalbuterol (XOPENEX) 0.63 MG/3ML nebulizer solution Use 1 vial via nebulizer every 4 hours as needed for wheezing or shortness of breath. 425 mL 3   omeprazole (PRILOSEC) 20 MG capsule Take 20 mg by mouth daily.     primidone (MYSOLINE) 50 MG tablet TAKE 1 TABLET(50 MG) BY MOUTH TWICE DAILY 180 tablet 1   sertraline (ZOLOFT) 100 MG tablet Take 100 mg by mouth daily.   4   telmisartan (MICARDIS) 40 MG tablet Take 40 mg by mouth daily.     TRELEGY ELLIPTA 100-62.5-25 MCG/INH AEPB INHALE 1 PUFF INTO THE LUNGS DAILY 120 each 3   vitamin B-12 (CYANOCOBALAMIN) 1000 MCG tablet Take 1,000 mcg by mouth daily.     No current facility-administered medications for this visit.    REVIEW OF SYSTEMS:  On review of systems, the patient reports that he is doing well overall. he denies any chest pain, increased shortness of breath, cough, hemoptysis, fevers, chills, night sweats or unintended weight changes. He denies any bowel or bladder disturbances, and denies abdominal pain, nausea or vomiting. He denies any new musculoskeletal or joint aches or pains. A complete review of systems is obtained and is otherwise negative.    PHYSICAL EXAM:  Wt Readings from Last 3 Encounters:  09/19/21 194 lb (88 kg)  09/18/21  193 lb (87.5 kg)  09/11/21 190 lb (86.2 kg)   Temp Readings from Last 3 Encounters:  09/19/21 98.2 F (36.8 C) (Oral)  09/18/21 (!) 97.5 F (36.4 C) (Oral)  09/11/21 97.7 F (36.5 C) (Oral)   BP Readings from Last 3 Encounters:  09/19/21 124/60  09/18/21 116/62  09/11/21 (!) 128/48   Pulse Readings from Last 3 Encounters:  09/19/21 76  09/18/21 74  09/11/21 63    /10  In general this is a well appearing Caucasian male in no acute distress. He 's alert and oriented x4 and appropriate throughout the examination. Cardiopulmonary assessment is negative for acute distress and he exhibits normal effort.   KPS = 100  100 - Normal; no complaints; no evidence of disease. 90   - Able to carry on normal activity; minor signs or symptoms of disease. 80   - Normal activity with effort; some signs or symptoms of disease. 82   - Cares for self; unable to carry on normal activity or to do active work. 60   - Requires occasional assistance, but is able to care for most of his personal needs. 50   - Requires considerable assistance and frequent medical care. 69   - Disabled; requires special care and assistance. 58   - Severely disabled; hospital admission is indicated although death not imminent. 37   - Very sick; hospital admission necessary; active supportive treatment necessary. 10   - Moribund; fatal processes progressing rapidly. 0     - Dead  Karnofsky DA, Abelmann Schleicher, Craver LS and Burchenal [facility] [phone]) The use of the nitrogen mustards in the palliative treatment of carcinoma: with particular reference to bronchogenic carcinoma Cancer 1 [id number]  LABORATORY DATA:  Lab Results  Component Value Date   WBC 5.4 07/25/2018   HGB 8.9 (L) 07/25/2018   HCT 27.7 (L) 07/25/2018   MCV 93.3 07/25/2018   PLT 245 07/25/2018  Lab Results  Component Value Date   NA 137 09/11/2021   K 4.5 09/11/2021   CL 105 09/11/2021   CO2 26 09/11/2021   Lab Results  Component Value Date   ALT 14  07/17/2018   AST 19 07/17/2018   ALKPHOS 85 07/17/2018   BILITOT 0.6 07/17/2018     RADIOGRAPHY: NM PET Image Initial (PI) Skull Base To Thigh (F-18 FDG)  Result Date: 08/29/2021 CLINICAL DATA:  Initial treatment strategy for pulmonary nodule in a 81 year old male with interval increase in size over time. EXAM: NUCLEAR MEDICINE PET SKULL BASE TO THIGH TECHNIQUE: 9.6 mCi F-18 FDG was injected intravenously. Full-ring PET imaging was performed from the skull base to thigh after the radiotracer. CT data was obtained and used for attenuation correction and anatomic localization. Fasting blood glucose: 129 mg/dl COMPARISON:  Chest CT of July 31, 2021 and multiple prior exams. FINDINGS: Mediastinal blood pool activity: SUV max 2.93 Liver activity: SUV max NA NECK: No hypermetabolic lymph nodes in the neck. Incidental CT findings: RIGHT maxillary sinus disease. Partial opacification of the RIGHT maxillary sinus without increased metabolic activity. No adenopathy by size criteria in the neck. CHEST: Nodule of concern in the RIGHT upper lobe (image 25/7) approximately 13 mm showing a maximum SUV of 4.90. Nodule is not associated with hypermetabolic lymph nodes in the chest. No additional areas of increased metabolic activity in the chest. Incidental CT findings: Calcified atheromatous plaque in the thoracic aorta. Three-vessel coronary artery disease. Normal heart size without substantial pericardial effusion. Normal caliber of central pulmonary vessels. Limited assessment of cardiovascular structures given lack of intravenous contrast. Basilar atelectasis.  Airways are patent. ABDOMEN/PELVIS: No abnormal hypermetabolic activity within the liver, pancreas, adrenal glands, or spleen. No hypermetabolic lymph nodes in the abdomen or pelvis. Incidental CT findings: No acute findings relative to liver, gallbladder, pancreas, spleen, adrenal glands, kidneys, stomach, small or large bowel. Pancolonic diverticulosis post  partial colonic resection in the area of the descending colon. Sigmoid diverticular disease is moderate to marked. Atherosclerotic changes of the abdominal aorta without aneurysm. Urinary bladder with smooth contours. Fat containing bilateral inguinal hernias. SKELETON: No focal hypermetabolic activity to suggest skeletal metastasis. Incidental CT findings: Spinal degenerative changes. No acute or destructive bone process. IMPRESSION: Nodule of concern in the RIGHT upper lobe is hypermetabolic and remains concerning for if not consistent with bronchogenic neoplasm. No signs of adenopathy in the chest or signs of distant metastatic disease. Colonic diverticulosis, aortic atherosclerosis and other incidental findings as outlined above. Aortic Atherosclerosis (ICD10-I70.0). Electronically Signed   By: Zetta Bills M.D.   On: 08/29/2021 17:04   DG CHEST PORT 1 VIEW  Result Date: 09/11/2021 CLINICAL DATA:  Status post bronchoscopy EXAM: PORTABLE CHEST 1 VIEW COMPARISON:  Radiograph 01/25/2020, chest CT 07/31/2021 FINDINGS: Unchanged cardiomediastinal silhouette. Faint opacification in the right upper lobe in the region of known pulmonary nodule. No large pleural effusion. No visible pneumothorax. No acute osseous abnormality. IMPRESSION: Faint opacity in the region of known right upper lobe pulmonary nodule, consistent with post bronchoscopy/biopsy changes. No visible pneumothorax. Electronically Signed   By: Maurine Simmering M.D.   On: 09/11/2021 15:29   DG C-ARM BRONCHOSCOPY  Result Date: 09/11/2021 C-ARM BRONCHOSCOPY: Fluoroscopy was utilized by the requesting physician.  No radiographic interpretation.      IMPRESSION/PLAN: 1. 81 y.o. male putative, Stage IA, NSCLC in the RUL lung. Today, we talked to the patient about the findings and workup thus far. Although the pathology was not  definitive, we support that this is most likely an early stage lung cancer, based on the radiographic evidence of gradual  enlargement of this lesion over time and the hypermetabolism on recent PET along with atypical cells seen on cytology. We discussed the natural history of Stage IA NSCLC and general treatment, highlighting the role of radiotherapy in the management. We discussed the available radiation techniques, and focused on the details and logistics of delivery. The recommendation is to proceed with a 3 fraction course of stereotactic body radiotherapy (SBRT) to the RUL nodule. We reviewed the anticipated acute and late sequelae associated with radiation in this setting. The patient was encouraged to ask questions that were answered to his stated satisfaction.   At the conclusion of our discussion, he is in agreement to proceed with the recommended 3 fraction course of stereotactic body radiotherapy (SBRT) to the RUL nodule.  He has freely signed written consent to proceed today in the office and a copy of this document will be placed in his chart. We will share our discussion with Dr. Valeta Harms and Dr. Benay Spice, and he will proceed with CT SIM/treatment planning following our visit today, in anticipation of beginning his treatments in the near future. We enjoyed meeting him today and look forward to continuing to participate in his care.  We personally spent 60 minutes in this encounter including chart review, reviewing radiological studies, meeting face-to-face with the patient, entering orders and completing documentation.    Nicholos Johns, PA-C    Tyler Pita, MD  [facility] Direct Dial: [phone]  Fax: [phone] [email or web address]  Skype  LinkedIn

## 2021-10-02 DIAGNOSIS — H26491 Other secondary cataract, right eye: Secondary | ICD-10-CM | POA: Diagnosis not present

## 2021-10-02 DIAGNOSIS — H52203 Unspecified astigmatism, bilateral: Secondary | ICD-10-CM | POA: Diagnosis not present

## 2021-10-02 DIAGNOSIS — H532 Diplopia: Secondary | ICD-10-CM | POA: Diagnosis not present

## 2021-10-02 DIAGNOSIS — Z961 Presence of intraocular lens: Secondary | ICD-10-CM | POA: Diagnosis not present

## 2021-10-04 DIAGNOSIS — Z87891 Personal history of nicotine dependence: Secondary | ICD-10-CM | POA: Diagnosis not present

## 2021-10-04 DIAGNOSIS — Z510 Encounter for antineoplastic radiation therapy: Secondary | ICD-10-CM | POA: Diagnosis not present

## 2021-10-04 DIAGNOSIS — C3411 Malignant neoplasm of upper lobe, right bronchus or lung: Secondary | ICD-10-CM | POA: Diagnosis not present

## 2021-10-10 ENCOUNTER — Other Ambulatory Visit: Payer: Self-pay

## 2021-10-10 ENCOUNTER — Ambulatory Visit
Admission: RE | Admit: 2021-10-10 | Discharge: 2021-10-10 | Disposition: A | Discharge status: Home / Self Care | Payer: Medicare PPO | Source: Ambulatory Visit | Attending: Radiation Oncology | Admitting: Radiation Oncology

## 2021-10-10 DIAGNOSIS — Z87891 Personal history of nicotine dependence: Secondary | ICD-10-CM | POA: Diagnosis not present

## 2021-10-10 DIAGNOSIS — C3411 Malignant neoplasm of upper lobe, right bronchus or lung: Secondary | ICD-10-CM

## 2021-10-10 DIAGNOSIS — Z510 Encounter for antineoplastic radiation therapy: Secondary | ICD-10-CM | POA: Diagnosis not present

## 2021-10-10 LAB — RAD ONC ARIA SESSION SUMMARY
Course Elapsed Days: 0
Plan Fractions Treated to Date: 1
Plan Prescribed Dose Per Fraction: 18 Gy
Plan Total Fractions Prescribed: 3
Plan Total Prescribed Dose: 54 Gy
Reference Point Dosage Given to Date: 18 Gy
Reference Point Session Dosage Given: 18 Gy
Session Number: 1

## 2021-10-11 ENCOUNTER — Ambulatory Visit: Payer: Medicare PPO | Admitting: Radiation Oncology

## 2021-10-12 ENCOUNTER — Other Ambulatory Visit: Payer: Self-pay

## 2021-10-12 ENCOUNTER — Ambulatory Visit
Admission: RE | Admit: 2021-10-12 | Discharge: 2021-10-12 | Disposition: A | Discharge status: Home / Self Care | Payer: Medicare PPO | Source: Ambulatory Visit | Attending: Radiation Oncology | Admitting: Radiation Oncology

## 2021-10-12 DIAGNOSIS — C3411 Malignant neoplasm of upper lobe, right bronchus or lung: Secondary | ICD-10-CM | POA: Insufficient documentation

## 2021-10-12 LAB — RAD ONC ARIA SESSION SUMMARY
Course Elapsed Days: 2
Plan Fractions Treated to Date: 2
Plan Prescribed Dose Per Fraction: 18 Gy
Plan Total Fractions Prescribed: 3
Plan Total Prescribed Dose: 54 Gy
Reference Point Dosage Given to Date: 36 Gy
Reference Point Session Dosage Given: 18 Gy
Session Number: 2

## 2021-10-12 LAB — FUNGAL ORGANISM REFLEX

## 2021-10-12 LAB — FUNGUS CULTURE RESULT

## 2021-10-12 LAB — FUNGUS CULTURE WITH STAIN

## 2021-10-16 ENCOUNTER — Encounter: Payer: Self-pay | Admitting: Urology

## 2021-10-16 ENCOUNTER — Ambulatory Visit: Payer: Medicare PPO

## 2021-10-16 ENCOUNTER — Other Ambulatory Visit: Payer: Self-pay

## 2021-10-16 ENCOUNTER — Ambulatory Visit
Admission: RE | Admit: 2021-10-16 | Discharge: 2021-10-16 | Disposition: A | Discharge status: Home / Self Care | Payer: Medicare PPO | Source: Ambulatory Visit | Attending: Radiation Oncology | Admitting: Radiation Oncology

## 2021-10-16 VITALS — BP 121/56 | HR 68 | Temp 97.2°F | Resp 20

## 2021-10-16 DIAGNOSIS — C3411 Malignant neoplasm of upper lobe, right bronchus or lung: Secondary | ICD-10-CM

## 2021-10-16 LAB — RAD ONC ARIA SESSION SUMMARY
Course Elapsed Days: 6
Plan Fractions Treated to Date: 3
Plan Prescribed Dose Per Fraction: 18 Gy
Plan Total Fractions Prescribed: 3
Plan Total Prescribed Dose: 54 Gy
Reference Point Dosage Given to Date: 54 Gy
Reference Point Session Dosage Given: 18 Gy
Session Number: 3

## 2021-10-17 ENCOUNTER — Ambulatory Visit: Payer: Medicare PPO | Admitting: Radiation Oncology

## 2021-10-17 DIAGNOSIS — H26491 Other secondary cataract, right eye: Secondary | ICD-10-CM | POA: Diagnosis not present

## 2021-10-24 ENCOUNTER — Other Ambulatory Visit: Payer: Self-pay | Admitting: Internal Medicine

## 2021-10-25 LAB — ACID FAST CULTURE WITH REFLEXED SENSITIVITIES (MYCOBACTERIA): Acid Fast Culture: NEGATIVE

## 2021-10-26 DIAGNOSIS — H903 Sensorineural hearing loss, bilateral: Secondary | ICD-10-CM | POA: Diagnosis not present

## 2021-10-29 ENCOUNTER — Other Ambulatory Visit: Payer: Self-pay | Admitting: Neurology

## 2021-10-29 DIAGNOSIS — G250 Essential tremor: Secondary | ICD-10-CM

## 2021-11-23 ENCOUNTER — Other Ambulatory Visit: Payer: Self-pay | Admitting: Cardiology

## 2021-11-23 DIAGNOSIS — I2510 Atherosclerotic heart disease of native coronary artery without angina pectoris: Secondary | ICD-10-CM

## 2021-11-28 ENCOUNTER — Encounter: Payer: Self-pay | Admitting: Urology

## 2021-11-28 ENCOUNTER — Other Ambulatory Visit: Payer: Self-pay | Admitting: Neurology

## 2021-11-28 NOTE — Progress Notes (Signed)
Telephone appointment. I verified patient's identity and began nursing interview. Patient reports some mild SOB and general arthritic aches. No other issues reported at this time.  Meaningful use complete.  Reminded patient of his 11:30am-11/29/21 telephone appointment w/ Ashlyn Bruning PA-C. I left my extension [phone] in case patient needs anything. Patient verbalized understanding.  Patient contact [phone]

## 2021-11-29 ENCOUNTER — Ambulatory Visit
Admission: RE | Admit: 2021-11-29 | Discharge: 2021-11-29 | Disposition: A | Discharge status: Home / Self Care | Payer: Medicare PPO | Source: Ambulatory Visit | Attending: Radiation Oncology | Admitting: Radiation Oncology

## 2021-11-29 DIAGNOSIS — C3411 Malignant neoplasm of upper lobe, right bronchus or lung: Secondary | ICD-10-CM | POA: Insufficient documentation

## 2021-12-04 ENCOUNTER — Telehealth: Payer: Self-pay | Admitting: *Deleted

## 2021-12-04 NOTE — Telephone Encounter (Signed)
CALLED PATIENT TO INFORM OF CT FOR 12-07-21- ARRIVAL TIME- 8:30 AM @ [facility], PATIENT TO HAVE WATER - 4 HRS. PRIOR TO TEST, PATIENT TO HAVE LABS DRAWN I-STAT IN RADIOLOGY, PATIENT TO RECEIVE RESULTS FROM ASHLYN BRUNING ON 12-12-21 @ 9 AM , VIA TELEPHONE , SPOKE WITH PATIENT AND HE IS AWARE OF THESE APPTS

## 2021-12-04 NOTE — Telephone Encounter (Signed)
RETURNED PATIENT'S PHONE CALL, SPOKE WITH PATIENT. ?

## 2021-12-04 NOTE — Progress Notes (Signed)
Radiation Oncology         [phone] ________________________________  Name: Glen [name] MRN: [id number]  Date: 11/29/2021  DOB: [birth year]  Post Treatment Note  CC: [name], MD  [name], MD  Diagnosis:    81 year old male putative, Stage IA, NSCLC in the RUL lung  Interval Since Last Radiation:  6.5 weeks  10/10/21 - 10/16/21: SBRT//  The target in the RUL lung was treated to 54 Gy in 3 fractions of 18 Gy  Narrative:  The patient returns today for routine follow-up.  He tolerated radiation treatment relatively well without any ill side effects aside from mild fatigue and mild dysphagia.                              On review of systems, the patient states that he is doing very well in general.  He reports that the dysphagia has resolved completely and his energy level is gradually improving.  He is currently without complaints and specifically denies increased shortness of breath, productive cough, chest pain, fever, chills or night sweats.  ALLERGIES:  is allergic to aspirin, lisinopril, nsaids, and other.  Meds: Current Outpatient Medications  Medication Sig Dispense Refill   acetaminophen (TYLENOL) 650 MG CR tablet Take 1,950 mg by mouth at bedtime.     albuterol (PROAIR HFA) 108 (90 Base) MCG/ACT inhaler Inhale 2 puffs every 6 hours as needed (Patient taking differently: Inhale 2 puffs into the lungs every 6 (six) hours as needed for wheezing or shortness of breath. Inhale 2 puffs every 6 hours as needed) 8.5 g 12   ALPRAZolam (XANAX) 0.25 MG tablet Take 0.25 mg by mouth daily as needed for anxiety.      amLODipine (NORVASC) 10 MG tablet Take 10 mg by mouth daily.     aspirin EC 81 MG tablet 1 tablet     atorvastatin (LIPITOR) 40 MG tablet Take 40 mg by mouth at bedtime.      Cyanocobalamin (VITAMIN B12) 1000 MCG TBCR 1 tablet     diphenhydrAMINE (BENADRYL) 25 MG tablet Take 50 mg by mouth at bedtime.     ezetimibe (ZETIA) 10 MG tablet TAKE 1 TABLET(10 MG) BY  MOUTH DAILY 90 tablet 0   ferrous sulfate 325 (65 FE) MG tablet Take 325 mg by mouth daily.     fluticasone (FLONASE) 50 MCG/ACT nasal spray Place 2 sprays into both nostrils daily as needed for allergies.     gabapentin (NEURONTIN) 100 MG capsule Take 1 capsule (100 mg total) by mouth every morning. 90 capsule 1   gabapentin (NEURONTIN) 300 MG capsule TAKE 2 CAPSULES BY MOUTH AT BEDTIME 180 capsule 0   hydrochlorothiazide (MICROZIDE) 12.5 MG capsule Take 12.5 mg by mouth daily.      levalbuterol (XOPENEX) 0.63 MG/3ML nebulizer solution Use 1 vial via nebulizer every 4 hours as needed for wheezing or shortness of breath. 425 mL 3   omeprazole (PRILOSEC) 20 MG capsule Take 20 mg by mouth daily.     primidone (MYSOLINE) 50 MG tablet TAKE 1 TABLET(50 MG) BY MOUTH TWICE DAILY 180 tablet 0   sertraline (ZOLOFT) 100 MG tablet Take 100 mg by mouth daily.   4   telmisartan (MICARDIS) 40 MG tablet Take 40 mg by mouth daily.     TRELEGY ELLIPTA 100-62.5-25 MCG/ACT AEPB INHALE 1 PUFF INTO THE LUNGS DAILY 120 each 3   vitamin B-12 (CYANOCOBALAMIN) 1000 MCG tablet  Take 1,000 mcg by mouth daily.     No current facility-administered medications for this encounter.    Physical Findings:  vitals were not taken for this visit.  Pain Assessment Pain Score: 0-No pain/10 Unable to assess due to telephone follow-up visit format.  Lab Findings: Lab Results  Component Value Date   WBC 5.4 07/25/2018   HGB 8.9 (L) 07/25/2018   HCT 27.7 (L) 07/25/2018   MCV 93.3 07/25/2018   PLT 245 07/25/2018     Radiographic Findings: No results found.  Impression/Plan: 31.  81 year old male putative, Stage IA, NSCLC in the RUL lung. He appears to have recovered well from the effects of his recent stereotactic body radiotherapy (SBRT) and is currently without complaints.  We discussed the plan to obtain a posttreatment CT chest in the next week to assess his treatment response.  I will plan to call him with those  results and pending this scan is stable, we will then proceed with serial CT chest scans every 3-6 months to continue to monitor for any evidence of disease recurrence or progression and we will follow-up by phone to review the results and any recommendations following each scan.  He appears to have a good understanding of these recommendations and is comfortable and in agreement with the stated plan.  He knows that he is welcome to call anytime in the interim with any questions or concerns related to his radiation.    Nicholos Johns, PA-C

## 2021-12-04 NOTE — Progress Notes (Signed)
  Radiation Oncology         [phone] ________________________________  Name: Glen [name] MRN: [id number]  Date: 10/16/2021  DOB: August 03, 1940  End of Treatment Note  Diagnosis:    81 year old male putative, Stage IA, NSCLC in the RUL lung     Indication for treatment:  Curative, Definitive SBRT       Radiation treatment dates:   10/10/21 - 10/16/21  Site/dose:   The target was treated to 54 Gy in 3 fractions of 18 Gy  Beams/energy:   The patient was treated using stereotactic body radiotherapy according to a 3D conformal radiotherapy plan.  Volumetric arc fields were employed to deliver 6 MV X-rays.  Image guidance was performed with per fraction cone beam CT prior to treatment under personal MD supervision.  Immobilization was achieved using BodyFix Pillow.  Narrative: The patient tolerated radiation treatment relatively well without any ill side effects.  Plan: The patient has completed radiation treatment. The patient will return to radiation oncology clinic for routine followup in one month. I advised them to call or return sooner if they have any questions or concerns related to their recovery or treatment. ________________________________  Sheral Apley. Tammi Klippel, M.D.

## 2021-12-07 ENCOUNTER — Ambulatory Visit (HOSPITAL_COMMUNITY)
Admission: RE | Admit: 2021-12-07 | Discharge: 2021-12-07 | Disposition: A | Discharge status: Home / Self Care | Payer: Medicare PPO | Source: Ambulatory Visit | Attending: Urology | Admitting: Urology

## 2021-12-07 DIAGNOSIS — C3411 Malignant neoplasm of upper lobe, right bronchus or lung: Secondary | ICD-10-CM | POA: Insufficient documentation

## 2021-12-07 DIAGNOSIS — C3490 Malignant neoplasm of unspecified part of unspecified bronchus or lung: Secondary | ICD-10-CM | POA: Diagnosis not present

## 2021-12-07 DIAGNOSIS — R918 Other nonspecific abnormal finding of lung field: Secondary | ICD-10-CM | POA: Diagnosis not present

## 2021-12-07 DIAGNOSIS — J439 Emphysema, unspecified: Secondary | ICD-10-CM | POA: Diagnosis not present

## 2021-12-07 LAB — POCT I-STAT CREATININE: Creatinine, Ser: 1.1 mg/dL (ref 0.61–1.24)

## 2021-12-07 MED ORDER — IOHEXOL 300 MG/ML  SOLN
75.0000 mL | Freq: Once | INTRAMUSCULAR | Status: AC | PRN
  Administered 2021-12-07: 75 mL via INTRAVENOUS

## 2021-12-07 MED ORDER — SODIUM CHLORIDE (PF) 0.9 % IJ SOLN
INTRAMUSCULAR | Status: AC
  Filled 2021-12-07: qty 50

## 2021-12-11 ENCOUNTER — Encounter: Payer: Self-pay | Admitting: Urology

## 2021-12-11 NOTE — Progress Notes (Signed)
Telephone appointment. I verified patient's identity and began nursing interview. Patient reports SOB, worsened by a RUL biopsy on 09/11/21 and patient's history of COPD. No other issues reported.  Meaningful use complete.  Reminded patient of their 11:00am-12/12/21 telephone appointment w/ Ashlyn Bruning PA-C. I left my extension [phone] in case patient needs anything. Patient verbalized understanding.  Patient contact [phone]

## 2021-12-11 NOTE — Progress Notes (Unsigned)
Assessment/Plan:    1.  Essential Tremor  -slightly increase primidone 50 mg, 2 in the AM, 1 in the evening  2.  Diplopia, likely the result of an ischemic event, September, 2021  -Now resolved  -Patient's MRI brain with white matter disease and moderate stenosis of the right MCA.  -MRA neck with 50% stenosis of the right carotid (note that carotid ultrasound had been estimating this much higher)  -Patient on Lipitor, 40 mg daily.  LDL was at goal at the time of the event  -Patient on aspirin, 81 mg daily.  3.  Peripheral neuropathy  -On gabapentin, 100 mg in the morning and 600 mg at night.    4.  Carotid stenosis, right  -Followed by vascular surgery.   5.  B12 deficiency  -on oral supplementation  6.  Lung cancer, stage I  -Patient currently undergoing radiation therapy.  -has f/u today.  CT looks improved Subjective:   Glen [name] was seen today in follow up for essential tremor.  My previous records were reviewed prior to todays visit.  Tremor has been fairly stable.  He continues to take his primidone, 50 mg twice per day. "I'm getting the tremors something awful." He is also on gabapentin for neuropathy.  Medical records reviewed since our last visit.  Patient has just recently been diagnosed with stage I lung cancer.  He did trip in the garden and fell.  He didn't get hurt.    Current prescribed movement disorder medications: 50 mg twice per day  Gabapentin, 100 mg in the morning, 600 mg at night   ALLERGIES:   Allergies  Allergen Reactions   Aspirin Other (See Comments)    pt has bleeding ulcers   Lisinopril Cough   Nsaids     Other reaction(s): GI upset   Other     GI Upset (intolerance)  AVOID NSAIDS    CURRENT MEDICATIONS:  Outpatient Encounter Medications as of 12/12/2021  Medication Sig   acetaminophen (TYLENOL) 650 MG CR tablet Take 1,950 mg by mouth at bedtime.   albuterol (PROAIR HFA) 108 (90 Base) MCG/ACT inhaler Inhale 2 puffs every 6  hours as needed (Patient taking differently: Inhale 2 puffs into the lungs every 6 (six) hours as needed for wheezing or shortness of breath. Inhale 2 puffs every 6 hours as needed)   ALPRAZolam (XANAX) 0.25 MG tablet Take 0.25 mg by mouth daily as needed for anxiety.    amLODipine (NORVASC) 10 MG tablet Take 10 mg by mouth daily.   aspirin EC 81 MG tablet 1 tablet   atorvastatin (LIPITOR) 40 MG tablet Take 40 mg by mouth at bedtime.    Cyanocobalamin (VITAMIN B12) 1000 MCG TBCR 1 tablet   diphenhydrAMINE (BENADRYL) 25 MG tablet Take 50 mg by mouth at bedtime.   ezetimibe (ZETIA) 10 MG tablet TAKE 1 TABLET(10 MG) BY MOUTH DAILY   ferrous sulfate 325 (65 FE) MG tablet Take 325 mg by mouth daily.   fluticasone (FLONASE) 50 MCG/ACT nasal spray Place 2 sprays into both nostrils daily as needed for allergies.   gabapentin (NEURONTIN) 100 MG capsule Take 1 capsule (100 mg total) by mouth every morning.   gabapentin (NEURONTIN) 300 MG capsule TAKE 2 CAPSULES BY MOUTH AT BEDTIME   hydrochlorothiazide (MICROZIDE) 12.5 MG capsule Take 12.5 mg by mouth daily.    levalbuterol (XOPENEX) 0.63 MG/3ML nebulizer solution Use 1 vial via nebulizer every 4 hours as needed for wheezing or shortness of breath.  omeprazole (PRILOSEC) 20 MG capsule Take 20 mg by mouth daily.   primidone (MYSOLINE) 50 MG tablet TAKE 1 TABLET(50 MG) BY MOUTH TWICE DAILY   sertraline (ZOLOFT) 100 MG tablet Take 100 mg by mouth daily.    telmisartan (MICARDIS) 40 MG tablet Take 40 mg by mouth daily.   TRELEGY ELLIPTA 100-62.5-25 MCG/ACT AEPB INHALE 1 PUFF INTO THE LUNGS DAILY   No facility-administered encounter medications on file as of 12/12/2021.     Objective:    PHYSICAL EXAMINATION:    VITALS:   Vitals:   12/12/21 0918  BP: (!) 120/50  Pulse: 76  SpO2: 94%  Weight: 198 lb 12.8 oz (90.2 kg)  Height: 5\' 7"  (1.702 m)     Wt Readings from Last 3 Encounters:  12/12/21 198 lb 12.8 oz (90.2 kg)  09/27/21 196 lb 6.4 oz  (89.1 kg)  09/19/21 194 lb (88 kg)     GEN:  The patient appears stated age and is in NAD. HEENT:  Normocephalic, atraumatic.   Neurological examination:  Orientation: The patient is alert and oriented x3. Cranial nerves: There is good facial symmetry. The speech is fluent and clear. Soft palate rises symmetrically and there is no tongue deviation. Hearing is intact to conversational tone. Sensation: Sensation is intact to light touch throughout Motor: Strength is at least antigravity x4.  Movement examination: Tone: There is normal tone in the UE/LE Abnormal movements: he has mild postural tremor (he states it gets worse).    Mild trouble with archimedes, L>R Coordination:  There is no decremation with RAM's Gait and Station: The patient has no difficulty arising out of a deep-seated chair without the use of the hands.   He has slight trouble in the turn I have reviewed and interpreted the following labs independently   Chemistry      Component Value Date/Time   NA 137 09/11/2021 1143   K 4.5 09/11/2021 1143   CL 105 09/11/2021 1143   CO2 26 09/11/2021 1143   BUN 21 09/11/2021 1143   CREATININE 1.10 12/07/2021 0835      Component Value Date/Time   CALCIUM 9.5 09/11/2021 1143   ALKPHOS 85 07/17/2018 0929   AST 19 07/17/2018 0929   ALT 14 07/17/2018 0929   BILITOT 0.6 07/17/2018 0929      Lab Results  Component Value Date   WBC 5.4 07/25/2018   HGB 8.9 (L) 07/25/2018   HCT 27.7 (L) 07/25/2018   MCV 93.3 07/25/2018   PLT 245 07/25/2018   Lab Results  Component Value Date   TSH 1.48 11/20/2015     Chemistry      Component Value Date/Time   NA 137 09/11/2021 1143   K 4.5 09/11/2021 1143   CL 105 09/11/2021 1143   CO2 26 09/11/2021 1143   BUN 21 09/11/2021 1143   CREATININE 1.10 12/07/2021 0835      Component Value Date/Time   CALCIUM 9.5 09/11/2021 1143   ALKPHOS 85 07/17/2018 0929   AST 19 07/17/2018 0929   ALT 14 07/17/2018 0929   BILITOT 0.6  07/17/2018 0929     Lab Results  Component Value Date   VITAMINB12 244 04/03/2018      Cc:  [name], MD

## 2021-12-12 ENCOUNTER — Ambulatory Visit
Admission: RE | Admit: 2021-12-12 | Discharge: 2021-12-12 | Disposition: A | Discharge status: Home / Self Care | Payer: Medicare PPO | Source: Ambulatory Visit | Attending: Urology | Admitting: Urology

## 2021-12-12 ENCOUNTER — Ambulatory Visit: Payer: Medicare PPO | Admitting: Neurology

## 2021-12-12 ENCOUNTER — Other Ambulatory Visit: Payer: Self-pay | Admitting: Urology

## 2021-12-12 ENCOUNTER — Encounter: Payer: Self-pay | Admitting: Neurology

## 2021-12-12 VITALS — BP 120/50 | HR 76 | Ht 67.0 in | Wt 198.8 lb

## 2021-12-12 DIAGNOSIS — G250 Essential tremor: Secondary | ICD-10-CM | POA: Diagnosis not present

## 2021-12-12 DIAGNOSIS — C3411 Malignant neoplasm of upper lobe, right bronchus or lung: Secondary | ICD-10-CM

## 2021-12-12 DIAGNOSIS — G609 Hereditary and idiopathic neuropathy, unspecified: Secondary | ICD-10-CM

## 2021-12-12 MED ORDER — PRIMIDONE 50 MG PO TABS: ORAL_TABLET | ORAL | 3 refills | Status: DC

## 2021-12-12 NOTE — Progress Notes (Signed)
Radiation Oncology         (336) [phone] ________________________________  Name: ANTHONNY SCHILLER MRN: [id number]  Date: 12/12/2021  DOB: [birth year]  Post Treatment Note  CC: [name], MD  [name], MD  Diagnosis:    81 year old male putative, Stage IA, NSCLC in the RUL lung  Interval Since Last Radiation:  8 weeks  10/10/21 - 10/16/21: SBRT//  The target in the RUL lung was treated to 54 Gy in 3 fractions of 18 Gy  Narrative:  I spoke with the patient to conduct his routine scheduled follow up visit via telephone to review his recent post-treatment CT Chest results to spare the patient unnecessary potential exposure in the healthcare setting during the current COVID-19 pandemic.  The patient was notified in advance and gave permission to proceed with this visit format.  He tolerated radiation treatment relatively well without any ill side effects aside from mild fatigue and mild dysphagia.                              On review of systems, the patient states that he is doing very well in general.  He reports that the dysphagia has resolved completely and his energy level is gradually improving.  He is currently without complaints and specifically denies productive cough, chest pain, fever, chills or night sweats. He has noted some mild increased shortness of breath but feels well otherwise. His recent post-treatment Chest CT from 12/07/21 shows an excellent response to treatment with the treated nodule in the right upper lobe lung now decreased in size, measuring 9 mm as compared to 13 mm prior to treatment and no new pulmonary nodules.  We reviewed these results today.  ALLERGIES:  is allergic to aspirin, lisinopril, nsaids, and other.  Meds: Current Outpatient Medications  Medication Sig Dispense Refill   acetaminophen (TYLENOL) 650 MG CR tablet Take 1,950 mg by mouth at bedtime.     albuterol (PROAIR HFA) 108 (90 Base) MCG/ACT inhaler Inhale 2 puffs every 6 hours as needed  (Patient taking differently: Inhale 2 puffs into the lungs every 6 (six) hours as needed for wheezing or shortness of breath. Inhale 2 puffs every 6 hours as needed) 8.5 g 12   ALPRAZolam (XANAX) 0.25 MG tablet Take 0.25 mg by mouth daily as needed for anxiety.      amLODipine (NORVASC) 10 MG tablet Take 10 mg by mouth daily.     aspirin EC 81 MG tablet 1 tablet     atorvastatin (LIPITOR) 40 MG tablet Take 40 mg by mouth at bedtime.      Cyanocobalamin (VITAMIN B12) 1000 MCG TBCR 1 tablet     diphenhydrAMINE (BENADRYL) 25 MG tablet Take 50 mg by mouth at bedtime.     ezetimibe (ZETIA) 10 MG tablet TAKE 1 TABLET(10 MG) BY MOUTH DAILY 90 tablet 0   ferrous sulfate 325 (65 FE) MG tablet Take 325 mg by mouth daily.     fluticasone (FLONASE) 50 MCG/ACT nasal spray Place 2 sprays into both nostrils daily as needed for allergies.     gabapentin (NEURONTIN) 100 MG capsule Take 1 capsule (100 mg total) by mouth every morning. 90 capsule 1   gabapentin (NEURONTIN) 300 MG capsule TAKE 2 CAPSULES BY MOUTH AT BEDTIME 180 capsule 0   hydrochlorothiazide (MICROZIDE) 12.5 MG capsule Take 12.5 mg by mouth daily.      levalbuterol (XOPENEX) 0.63 MG/3ML nebulizer solution Use 1  vial via nebulizer every 4 hours as needed for wheezing or shortness of breath. 425 mL 3   omeprazole (PRILOSEC) 20 MG capsule Take 20 mg by mouth daily.     primidone (MYSOLINE) 50 MG tablet 2 in the AM, 1 in the PM 270 tablet 3   sertraline (ZOLOFT) 100 MG tablet Take 100 mg by mouth daily.   4   telmisartan (MICARDIS) 40 MG tablet Take 40 mg by mouth daily.     TRELEGY ELLIPTA 100-62.5-25 MCG/ACT AEPB INHALE 1 PUFF INTO THE LUNGS DAILY 120 each 3   No current facility-administered medications for this encounter.    Physical Findings:  vitals were not taken for this visit.  Pain Assessment Pain Score: 0-No pain/10 Unable to assess due to telephone follow-up visit format.  Lab Findings: Lab Results  Component Value Date   WBC  5.4 07/25/2018   HGB 8.9 (L) 07/25/2018   HCT 27.7 (L) 07/25/2018   MCV 93.3 07/25/2018   PLT 245 07/25/2018     Radiographic Findings: CT Chest W Contrast  Result Date: 12/07/2021 CLINICAL DATA:  Non-small cell lung cancer.  Restaging. EXAM: CT CHEST WITH CONTRAST TECHNIQUE: Multidetector CT imaging of the chest was performed during intravenous contrast administration. RADIATION DOSE REDUCTION: This exam was performed according to the departmental dose-optimization program which includes automated exposure control, adjustment of the mA and/or kV according to patient size and/or use of iterative reconstruction technique. CONTRAST:  20mL OMNIPAQUE IOHEXOL 300 MG/ML  SOLN COMPARISON:  PET-CT 08/29/2021.  CT of the chest 07/31/2021. FINDINGS: Cardiovascular: No significant vascular findings. Normal heart size. No pericardial effusion. There are atherosclerotic calcifications of the aorta and coronary arteries. Mediastinum/Nodes: No enlarged mediastinal, hilar, or axillary lymph nodes. Thyroid gland, trachea, and esophagus demonstrate no significant findings. Lungs/Pleura: Mild emphysematous changes are again seen. There is a 3 mm nodule in the right upper lobe image 7/21 which is unchanged. Previously identified solid right upper lobe nodule is now less dense and slightly smaller in size measuring 9 x 8 mm (previously 12 x 9 mm). Radiopaque marker seen adjacent to this nodule, new from prior. Scattered tree-in-bud opacities in the posterior right upper lobe persist. No focal lung infiltrate, pleural effusion or pneumothorax. There are minimal secretions in the distal trachea. No new pulmonary nodules are identified. Upper Abdomen: No acute abnormality. Musculoskeletal: No chest wall abnormality. No acute osseous findings. There is DISH of the thoracic spine. IMPRESSION: 1. Right upper lobe pulmonary nodule has decreased in size and density now measuring up to 9 mm. No new pulmonary nodules. 2. Tree-in-bud  opacities in the right upper lobe are unchanged, likely infectious/inflammatory. 3. Aortic Atherosclerosis (ICD10-I70.0) and Emphysema (ICD10-J43.9). Electronically Signed   By: Ronney Asters M.D.   On: 12/07/2021 21:12    Impression/Plan: 48.  81 year old male putative, Stage IA, NSCLC in the RUL lung. He appears to have recovered well from the effects of his recent stereotactic body radiotherapy (SBRT).  He has some mild increased shortness of breath but feels well otherwise.  He is going to monitor this closely and knows to call immediately should this progress in any way or should he develop a productive cough, fever, chills or night sweats.  We reviewed the results of his recent posttreatment CT chest which shows an excellent response to treatment and discussed the plan to proceed with serial CT chest scans every 3-6 months going forward, to continue to monitor for any evidence of disease recurrence or progression. We will  follow-up by phone to review the results and any recommendations following each scan.  He appears to have a good understanding of these recommendations and is comfortable and in agreement with the stated plan.  He knows that he is welcome to call anytime in the interim with any questions or concerns related to his radiation.    Nicholos Johns, PA-C

## 2021-12-12 NOTE — Patient Instructions (Signed)
Essential Tremor ?A tremor is trembling or shaking that a person cannot control. Most tremors affect the hands or arms. Tremors can also affect the head, vocal cords, legs, and other parts of the body. Essential tremor is a tremor without a known cause. Usually, it occurs while a person is trying to perform an action. It tends to get worse gradually as a person ages. ?What are the causes? ?The cause of this condition is not known, but it often runs in families. ?What increases the risk? ?You are more likely to develop this condition if: ?You have a family member with essential tremor. ?You are 40 years of age or older. ?What are the signs or symptoms? ?The main sign of a tremor is a rhythmic shaking of certain parts of your body that is uncontrolled and unintentional. You may: ?Have difficulty eating with a spoon or fork. ?Have difficulty writing. ?Nod your head up and down or side to side. ?Have a quivering voice. ?The shaking may: ?Get worse over time. ?Come and go. ?Be more noticeable on one side of your body. ?Get worse due to stress, tiredness (fatigue), caffeine, and extreme heat or cold. ?How is this diagnosed? ?This condition may be diagnosed based on: ?Your symptoms and medical history. ?A physical exam. ?There is no single test to diagnose an essential tremor. However, your health care provider may order tests to rule out other causes of your condition. These may include: ?Blood and urine tests. ?Imaging studies of your brain, such as a CT scan or MRI. ?How is this treated? ?Treatment for essential tremor depends on the severity of the condition. ?Mild tremors may not need treatment if they do not affect your day-to-day life. ?Severe tremors may need to be treated using one or more of the following options: ?Medicines. ?Injections of a substance called botulinum toxin. ?Procedures such as deep brain stimulation (DBS) implantation or MRI-guided ultrasound treatment. ?Lifestyle changes. ?Occupational or  physical therapy. ?Follow these instructions at home: ?Lifestyle ? ?Do not use any products that contain nicotine or tobacco. These products include cigarettes, chewing tobacco, and vaping devices, such as e-cigarettes. If you need help quitting, ask your health care provider. ?Limit your caffeine intake as told by your health care provider. ?Try to get 8 hours of sleep each night. ?Find ways to manage your stress that fit your lifestyle and personality. Consider trying meditation or yoga. ?Try to anticipate stressful situations and allow extra time to manage them. ?If you are struggling emotionally with the effects of your tremor, consider working with a mental health provider. ?General instructions ?Take over-the-counter and prescription medicines only as told by your health care provider. ?Avoid extreme heat and extreme cold. ?Keep all follow-up visits. This is important. Visits may include physical therapy visits. ?Where to find more information ?National Institute of Neurological Disorders and Stroke: www.ninds.nih.gov ?Contact a health care provider if: ?You experience any changes in the location or intensity of your tremors. ?You start having a tremor after starting a new medicine. ?You have a tremor with other symptoms, such as: ?Numbness. ?Tingling. ?Pain. ?Weakness. ?Your tremor gets worse. ?Your tremor interferes with your daily life. ?You feel down, blue, or sad for at least 2 weeks in a row. ?Worrying about your tremor and what other people think about you interferes with your everyday life functions, including relationships, work, or school. ?Summary ?Essential tremor is a tremor without a known cause. Usually, it occurs when you are trying to perform an action. ?You are more likely   to develop this condition if you have a family member with essential tremor. ?The main sign of a tremor is a rhythmic shaking of certain parts of your body that is uncontrolled and unintentional. ?Treatment for essential  tremor depends on the severity of the condition. ?This information is not intended to replace advice given to you by your health care provider. Make sure you discuss any questions you have with your health care provider. ?Document Revised: 02/16/2021 Document Reviewed: 02/16/2021 ?Elsevier Patient Education ? 2023 [employer] ? ?

## 2021-12-21 ENCOUNTER — Encounter: Payer: Self-pay | Admitting: Neurology

## 2021-12-26 ENCOUNTER — Other Ambulatory Visit: Payer: Self-pay | Admitting: Neurology

## 2021-12-26 DIAGNOSIS — G250 Essential tremor: Secondary | ICD-10-CM

## 2022-01-26 ENCOUNTER — Other Ambulatory Visit: Payer: Self-pay | Admitting: Neurology

## 2022-01-26 DIAGNOSIS — G250 Essential tremor: Secondary | ICD-10-CM

## 2022-02-05 DIAGNOSIS — I6521 Occlusion and stenosis of right carotid artery: Secondary | ICD-10-CM | POA: Diagnosis not present

## 2022-02-05 DIAGNOSIS — Z0001 Encounter for general adult medical examination with abnormal findings: Secondary | ICD-10-CM | POA: Diagnosis not present

## 2022-02-05 DIAGNOSIS — E611 Iron deficiency: Secondary | ICD-10-CM | POA: Diagnosis not present

## 2022-02-05 DIAGNOSIS — R7303 Prediabetes: Secondary | ICD-10-CM | POA: Diagnosis not present

## 2022-02-05 DIAGNOSIS — Z23 Encounter for immunization: Secondary | ICD-10-CM | POA: Diagnosis not present

## 2022-02-05 DIAGNOSIS — R399 Unspecified symptoms and signs involving the genitourinary system: Secondary | ICD-10-CM | POA: Diagnosis not present

## 2022-02-05 DIAGNOSIS — I700 Atherosclerosis of aorta: Secondary | ICD-10-CM | POA: Diagnosis not present

## 2022-02-05 DIAGNOSIS — Z79899 Other long term (current) drug therapy: Secondary | ICD-10-CM | POA: Diagnosis not present

## 2022-02-05 DIAGNOSIS — I2510 Atherosclerotic heart disease of native coronary artery without angina pectoris: Secondary | ICD-10-CM | POA: Diagnosis not present

## 2022-02-05 DIAGNOSIS — J449 Chronic obstructive pulmonary disease, unspecified: Secondary | ICD-10-CM | POA: Diagnosis not present

## 2022-02-05 DIAGNOSIS — K2950 Unspecified chronic gastritis without bleeding: Secondary | ICD-10-CM | POA: Diagnosis not present

## 2022-02-05 DIAGNOSIS — E7800 Pure hypercholesterolemia, unspecified: Secondary | ICD-10-CM | POA: Diagnosis not present

## 2022-02-05 DIAGNOSIS — Z125 Encounter for screening for malignant neoplasm of prostate: Secondary | ICD-10-CM | POA: Diagnosis not present

## 2022-02-18 DIAGNOSIS — M25552 Pain in left hip: Secondary | ICD-10-CM | POA: Diagnosis not present

## 2022-02-18 DIAGNOSIS — M1612 Unilateral primary osteoarthritis, left hip: Secondary | ICD-10-CM | POA: Diagnosis not present

## 2022-02-21 ENCOUNTER — Other Ambulatory Visit: Payer: Self-pay | Admitting: Cardiology

## 2022-02-21 DIAGNOSIS — I2510 Atherosclerotic heart disease of native coronary artery without angina pectoris: Secondary | ICD-10-CM

## 2022-03-05 ENCOUNTER — Telehealth: Payer: Self-pay | Admitting: *Deleted

## 2022-03-05 NOTE — Telephone Encounter (Signed)
CALLED PATIENT TO INFORM OF CT FOR 03-14-22- ARRIVAL TIME- 2:30 PM @ [facility], PATIENT TO BE NPO- 4 HRS. PRIOR TO TEST, PATIENT TO RECEIVE RESULTS FROM ASHLYN BRUNING ON 03-28-22 @ 2 PM VIA TELEPHONE , LVM FOR A RETURN CALL

## 2022-03-13 DIAGNOSIS — M25552 Pain in left hip: Secondary | ICD-10-CM | POA: Diagnosis not present

## 2022-03-14 ENCOUNTER — Ambulatory Visit (HOSPITAL_COMMUNITY)
Admission: RE | Admit: 2022-03-14 | Discharge: 2022-03-14 | Disposition: A | Discharge status: Home / Self Care | Payer: Medicare PPO | Source: Ambulatory Visit | Attending: Urology | Admitting: Urology

## 2022-03-14 DIAGNOSIS — J432 Centrilobular emphysema: Secondary | ICD-10-CM | POA: Diagnosis not present

## 2022-03-14 DIAGNOSIS — C3490 Malignant neoplasm of unspecified part of unspecified bronchus or lung: Secondary | ICD-10-CM | POA: Diagnosis not present

## 2022-03-14 DIAGNOSIS — C3411 Malignant neoplasm of upper lobe, right bronchus or lung: Secondary | ICD-10-CM | POA: Diagnosis not present

## 2022-03-14 DIAGNOSIS — R918 Other nonspecific abnormal finding of lung field: Secondary | ICD-10-CM | POA: Diagnosis not present

## 2022-03-14 MED ORDER — IOHEXOL 300 MG/ML  SOLN
75.0000 mL | Freq: Once | INTRAMUSCULAR | Status: AC | PRN
Start: 2022-03-14 — End: 2022-03-14
  Administered 2022-03-14: 75 mL via INTRAVENOUS

## 2022-03-22 ENCOUNTER — Inpatient Hospital Stay: Payer: Medicare PPO | Attending: Oncology

## 2022-03-22 ENCOUNTER — Inpatient Hospital Stay: Payer: Medicare PPO | Admitting: Oncology

## 2022-03-22 VITALS — BP 129/58 | HR 84 | Temp 98.2°F | Resp 18 | Ht 67.0 in | Wt 196.4 lb

## 2022-03-22 DIAGNOSIS — G629 Polyneuropathy, unspecified: Secondary | ICD-10-CM | POA: Insufficient documentation

## 2022-03-22 DIAGNOSIS — C186 Malignant neoplasm of descending colon: Secondary | ICD-10-CM

## 2022-03-22 DIAGNOSIS — I10 Essential (primary) hypertension: Secondary | ICD-10-CM | POA: Diagnosis not present

## 2022-03-22 DIAGNOSIS — R911 Solitary pulmonary nodule: Secondary | ICD-10-CM | POA: Insufficient documentation

## 2022-03-22 DIAGNOSIS — K5900 Constipation, unspecified: Secondary | ICD-10-CM | POA: Diagnosis not present

## 2022-03-22 DIAGNOSIS — E538 Deficiency of other specified B group vitamins: Secondary | ICD-10-CM | POA: Insufficient documentation

## 2022-03-22 DIAGNOSIS — K219 Gastro-esophageal reflux disease without esophagitis: Secondary | ICD-10-CM | POA: Diagnosis not present

## 2022-03-22 DIAGNOSIS — J449 Chronic obstructive pulmonary disease, unspecified: Secondary | ICD-10-CM | POA: Diagnosis not present

## 2022-03-22 DIAGNOSIS — Z923 Personal history of irradiation: Secondary | ICD-10-CM | POA: Insufficient documentation

## 2022-03-22 LAB — CEA (ACCESS): CEA (CHCC): 1.98 ng/mL (ref 0.00–5.00)

## 2022-03-22 NOTE — Progress Notes (Signed)
[facility] OFFICE PROGRESS NOTE   Diagnosis: Colon cancer  INTERVAL HISTORY:   Glen. Ginley returns as scheduled.  Good appetite.  He reports improvement in left hip pain after receiving an injection.  He has diarrhea when taking MiraLAX.  He has constipation when off of MiraLAX.  No bleeding.  He continues iron therapy as recommended by his primary provider.  He completed SBRT to the right lung lesion in June.  Objective:  Vital signs in last 24 hours:  Blood pressure (!) 129/58, pulse 84, temperature 98.2 F (36.8 C), temperature source Oral, resp. rate 18, height _0  (1.702 m), weight 196 lb 6.4 oz (89.1 kg), SpO2 96 %.    HEENT: Neck without mass Lymphatics: No cervical, supraclavicular, axillary, or inguinal nodes Resp: Distant breath sounds, no respiratory distress Cardio: Regular rate and rhythm GI: No hepatosplenomegaly, no mass, nontender Vascular: No leg edema   Lab Results:  Lab Results  Component Value Date   WBC 5.4 07/25/2018   HGB 8.9 (L) 07/25/2018   HCT 27.7 (L) 07/25/2018   MCV 93.3 07/25/2018   PLT 245 07/25/2018   NEUTROABS 5.3 07/17/2018    CMP  Lab Results  Component Value Date   NA 137 09/11/2021   K 4.5 09/11/2021   CL 105 09/11/2021   CO2 26 09/11/2021   GLUCOSE 121 (H) 09/11/2021   BUN 21 09/11/2021   CREATININE 1.10 12/07/2021   CALCIUM 9.5 09/11/2021   PROT 6.0 (L) 07/17/2018   ALBUMIN 3.4 (L) 07/17/2018   AST 19 07/17/2018   ALT 14 07/17/2018   ALKPHOS 85 07/17/2018   BILITOT 0.6 07/17/2018   GFRNONAA >60 09/11/2021   GFRAA >60 07/25/2018    Lab Results  Component Value Date   CEA1 1.87 09/14/2020   CEA 2.10 09/19/2021    Medications: I have reviewed the patient's current medications.   Assessment/Plan:  Colon cancer  Colonoscopy by Dr. Oletta Lamas 06/22/2018-polypoid completely obstructing large mass was found in the proximal sigmoid colon.  Pathology showed fragments of ulcer and inflammatory debris;  no viable tissue present.   CT abdomen/pelvis 06/30/2018-severe descending and sigmoid diverticulosis.  There was a focal dilatation of the mid descending colon to approximately 8.5 x 6.2 cm.  There was no overt exophytic mass or mesenteric lymphadenopathy.  No evidence of abdominal metastatic disease.   07/21/2018 open left colectomy with primary anastomosis, primary repair of umbilical hernia by Dr. Dalbert Batman.  Pathology showed invasive moderately differentiated adenocarcinoma with mucinous features measuring 7.5 cm.  There was circumferential involvement of the descending colon with luminal obstruction.  Carcinoma focally invaded into the pericolonic soft tissue.  Margins negative.  No lymphovascular or perineural invasion.  21 lymph nodes negative for cancer; loss of expression MLH1 and PMS2; MSI high.  MLH1 hyper methylation present Colonoscopy 05/27/2019-polyps removed from the sigmoid, descending, cecum, ascending, and transverse colon, nodular mucosa at the colonic anastomosis biopsied-benign ulcerated mucosa, polyps were tubular adenomas, mucosal prolapse polyps,  and a hyperplastic polyp Upper endoscopy 06/22/2018-esophageal mucosal changes suggestive of long segment Barrett's esophagus.  A few gastric polyps.  Normal examined duodenum.  GERD.  Stomach biopsy-fundic gland polyp.  Negative for dysplasia.  Esophagus biopsy-Barrett's mucosa.  Negative for dysplasia.   COPD Hypertension B12 deficiency Peripheral neuropathy Right lung nodule CT chest 07/31/2021-increased size of a right upper lobe nodule compared to December 2022 PET 08/29/2021-hypermetabolic right upper lobe nodule, no adenopathy or evidence of distant metastatic disease Bronchoscopy biopsy of right upper lobe  nodule 09/11/2021-brushing with atypical large cells suspicious for tumor SBRT to right upper lung lesion 10/10/2021 - 10/17/2018 23-54 Gray in 3 fractions CT chest 11/29/2021-decrease size of right upper lobe nodule, no new nodules CT  chest 03/14/2022-development of groundglass and consolidative airspace opacity in the right upper lobe about a nodule with adjacent biopsy clip, nodule unchanged in size, new subpleural nodule in the medial right lower lobe-nonspecific     Disposition: Glen [name] remains in clinical remission from colon cancer.  He will be due for a colonoscopy next year.  He will continue follow-up with radiation oncology for the treated right upper lobe lesion.  There is a new subpleural nodule in the medial right lower lobe.  Radiology follow-up will be ordered by radiation oncology.  He will return for an office visit in 6 months.  Betsy Coder, MD  03/22/2022  10:13 AM

## 2022-03-27 ENCOUNTER — Encounter: Payer: Self-pay | Admitting: Urology

## 2022-03-27 NOTE — Progress Notes (Signed)
Telephone nursing appointment for 81 year old male putative, Stage IA, NSCLC in the RUL lung. I verified patient's identity and began nursing interview. Patient reports moderate SOB (copd), managed w/ albuterol products.   Meaningful use complete.   Patient aware of their 1:30pm telephone appointment w/ Ashlyn Bruning PA-C. I left my extension [phone] in case patient needs anything. Patient verbalized understanding. This concludes the nursing interview.   Patient contact [phone]     Leandra Kern, LPN

## 2022-03-28 ENCOUNTER — Ambulatory Visit
Admission: RE | Admit: 2022-03-28 | Discharge: 2022-03-28 | Disposition: A | Discharge status: Home / Self Care | Payer: Medicare PPO | Source: Ambulatory Visit | Attending: Urology | Admitting: Urology

## 2022-03-28 DIAGNOSIS — C3411 Malignant neoplasm of upper lobe, right bronchus or lung: Secondary | ICD-10-CM | POA: Diagnosis not present

## 2022-03-28 DIAGNOSIS — Z87891 Personal history of nicotine dependence: Secondary | ICD-10-CM | POA: Diagnosis not present

## 2022-03-28 MED ORDER — LEVOFLOXACIN 750 MG PO TABS: 750.0000 mg | ORAL_TABLET | Freq: Every day | ORAL | 0 refills | Status: DC

## 2022-03-28 MED ORDER — PREDNISONE 20 MG PO TABS
20.0000 mg | ORAL_TABLET | ORAL | 0 refills | Status: DC
Start: 2022-03-28 — End: 2022-09-20

## 2022-03-28 NOTE — Progress Notes (Signed)
Radiation Oncology         [phone] ________________________________  Name: Glen [name] MRN: [id number]  Date: 03/28/2022  DOB: Jun 30, 1940  Post Treatment Note  CC: [name], MD  [name], MD  Diagnosis:    81 year old male putative, Stage IA, NSCLC in the RUL lung  Interval Since Last Radiation:  5 months  10/10/21 - 10/16/21: SBRT//  The target in the RUL lung was treated to 54 Gy in 3 fractions of 18 Gy  Narrative:  I spoke with the patient to conduct his routine scheduled follow up visit via telephone to review his recent post-treatment CT Chest results to spare the patient unnecessary potential exposure in the healthcare setting during the current COVID-19 pandemic.  The patient was notified in advance and gave permission to proceed with this visit format.  He tolerated radiation treatment relatively well without any ill side effects aside from mild fatigue and mild dysphagia.                              On review of systems, the patient states that he is doing very well in general.  He reports that the dysphagia has resolved completely and his energy level is gradually improving.  He has developed some progressive shortness of breath and a productive cough with green/yellow phlegm but denies chest pain, fever, chills or night sweats. He also reports some increased fatigue but feels well otherwise.  His recent post-treatment Chest CT from 03/14/22 shows findings consistent with developing radiation pneumonitis and fibrosis but the treated nodule is stable.  There is a new subpleural nodule of the medial right lower lobe measuring 1.4 x 0.8 cm. This is nonspecific and is most likely infectious or inflammatory, particularly in light of the rapid development as well as additional new more clearly infectious/inflammatory nodularity seen in the left lung base.  We reviewed these results today.  ALLERGIES:  is allergic to aspirin, lisinopril, nsaids, and  other.  Meds: Current Outpatient Medications  Medication Sig Dispense Refill   levofloxacin (LEVAQUIN) 750 MG tablet Take 1 tablet (750 mg total) by mouth daily. 7 tablet 0   predniSONE (DELTASONE) 20 MG tablet Take 1 tablet (20 mg total) by mouth as directed. Beginning 5 days into taking antibiotics, take 3 tablets daily x5 days, then 2.5 daily x5 days, then 2 daily x5 days, then 1.5 daily x5 days, then 1 daily x5 days ,then 1/2 daily x5 days and stop. 53 tablet 0   acetaminophen (TYLENOL) 650 MG CR tablet Take 1,950 mg by mouth at bedtime.     albuterol (PROAIR HFA) 108 (90 Base) MCG/ACT inhaler Inhale 2 puffs every 6 hours as needed (Patient taking differently: Inhale 2 puffs into the lungs every 6 (six) hours as needed for wheezing or shortness of breath. Inhale 2 puffs every 6 hours as needed) 8.5 g 12   ALPRAZolam (XANAX) 0.25 MG tablet Take 0.25 mg by mouth daily as needed for anxiety.      amLODipine (NORVASC) 10 MG tablet Take 10 mg by mouth daily.     aspirin EC 81 MG tablet 1 tablet     atorvastatin (LIPITOR) 40 MG tablet Take 40 mg by mouth at bedtime.      Cyanocobalamin (VITAMIN B12) 1000 MCG TBCR 1 tablet     diphenhydrAMINE (BENADRYL) 25 MG tablet Take 50 mg by mouth at bedtime.     ezetimibe (ZETIA) 10 MG  tablet TAKE 1 TABLET(10 MG) BY MOUTH DAILY 90 tablet 0   ferrous sulfate 325 (65 FE) MG tablet Take 325 mg by mouth daily.     fluticasone (FLONASE) 50 MCG/ACT nasal spray Place 2 sprays into both nostrils daily as needed for allergies.     gabapentin (NEURONTIN) 100 MG capsule TAKE 1 CAPSULE(100 MG) BY MOUTH EVERY MORNING 90 capsule 1   gabapentin (NEURONTIN) 300 MG capsule TAKE 2 CAPSULES BY MOUTH AT BEDTIME 180 capsule 0   hydrochlorothiazide (MICROZIDE) 12.5 MG capsule Take 12.5 mg by mouth daily.      levalbuterol (XOPENEX) 0.63 MG/3ML nebulizer solution Use 1 vial via nebulizer every 4 hours as needed for wheezing or shortness of breath. (Patient not taking: Reported on  03/22/2022) 425 mL 3   omeprazole (PRILOSEC) 20 MG capsule Take 20 mg by mouth daily.     Polyethylene Glycol 3350 (MIRALAX PO) Take 17 g by mouth daily.     primidone (MYSOLINE) 50 MG tablet 2 in the AM, 1 in the PM 270 tablet 3   sertraline (ZOLOFT) 100 MG tablet Take 100 mg by mouth daily.   4   telmisartan (MICARDIS) 40 MG tablet Take 40 mg by mouth daily.     TRELEGY ELLIPTA 100-62.5-25 MCG/ACT AEPB INHALE 1 PUFF INTO THE LUNGS DAILY 120 each 3   No current facility-administered medications for this encounter.    Physical Findings:  vitals were not taken for this visit.   /10 Unable to assess due to telephone follow-up visit format.  Lab Findings: Lab Results  Component Value Date   WBC 5.4 07/25/2018   HGB 8.9 (L) 07/25/2018   HCT 27.7 (L) 07/25/2018   MCV 93.3 07/25/2018   PLT 245 07/25/2018     Radiographic Findings: CT Chest W Contrast  Result Date: 03/17/2022 CLINICAL DATA:  Non-small cell lung cancer restaging, status post right upper lobe SBRT * Tracking Code: BO * EXAM: CT CHEST WITH CONTRAST TECHNIQUE: Multidetector CT imaging of the chest was performed during intravenous contrast administration. RADIATION DOSE REDUCTION: This exam was performed according to the departmental dose-optimization program which includes automated exposure control, adjustment of the mA and/or kV according to patient size and/or use of iterative reconstruction technique. CONTRAST:  20mL OMNIPAQUE IOHEXOL 300 MG/ML  SOLN COMPARISON:  12/07/2021 FINDINGS: Cardiovascular: Aortic atherosclerosis. Normal heart size. Three-vessel coronary artery calcifications. No pericardial effusion. Mediastinum/Nodes: No enlarged mediastinal, hilar, or axillary lymph nodes. Thyroid gland, trachea, and esophagus demonstrate no significant findings. Lungs/Pleura: Mild centrilobular emphysema. Diffuse bilateral bronchial wall thickening. Interval development of extensive heterogeneous ground-glass and consolidative  airspace opacity in the inferior right upper lobe about a nodule with adjacent biopsy marking clip. Nodule is not significantly changed in size, measuring 0.9 x 0.4 cm (series 5, image 61). There is a new subpleural nodule of the medial right lower lobe measuring 1.4 x 0.8 cm (series 5, image 116). New clustered nodularity and ground-glass in the dependent left lung base (series 5, image 123). No pleural effusion or pneumothorax. Upper Abdomen: No acute abnormality.  Hepatic steatosis. Musculoskeletal: No chest wall abnormality. No acute osseous findings. IMPRESSION: 1. Interval development of extensive heterogeneous ground-glass and consolidative airspace opacity in the inferior right upper lobe about a nodule with adjacent biopsy marking clip. Nodule is not significantly changed in size, measuring 0.9 x 0.4 cm. Findings are consistent with developing radiation pneumonitis and fibrosis. 2. New subpleural nodule of the medial right lower lobe measuring 1.4 x 0.8 cm. This  is nonspecific and may be infectious or inflammatory, particularly in light of additional new more clearly infectious or inflammatory nodularity seen in the left lung base, however metastatic disease is not excluded. 3. Emphysema and diffuse bilateral bronchial wall thickening. 4. Coronary artery disease. 5. Hepatic steatosis. Aortic Atherosclerosis (ICD10-I70.0) and Emphysema (ICD10-J43.9). Electronically Signed   By: Delanna Ahmadi M.D.   On: 03/17/2022 15:13    Impression/Plan: 2.  81 year old male putative, Stage IA, NSCLC in the RUL lung. He appears to be recovering from the effects of his recent stereotactic body radiotherapy (SBRT) but has findings on his most recent CT chest to suggest developing radiation pneumonitis.  He also has some progressive shortness of breath and productive cough so we will plan to treat with antibiotics and steroid taper.  I have sent a Rx for Levaquin 750mg  po qd x7 days and a prednisone taper which he will  pick up and start soon.  We will plan to repeat short interval CT chest scan in 2 months to monitor his progress. If there is any indication of enlargement of the new RLL nodule on follow up scan, we will proceed with PET imaging but I suspect we will see improvement with treatment of the pneumonitis. If his upcoming scan in stable, we will then proceed with serial CT Chest scans every 3-6 months going forward, to continue to monitor for any evidence of disease recurrence or progression. We will follow-up by phone to review the results and any recommendations following each scan.  He appears to have a good understanding of these recommendations and is comfortable and in agreement with the stated plan.  He knows that he is welcome to call anytime in the interim with any questions or concerns related to his radiation.    Nicholos Johns, PA-C

## 2022-04-02 DIAGNOSIS — M545 Low back pain, unspecified: Secondary | ICD-10-CM | POA: Diagnosis not present

## 2022-04-02 DIAGNOSIS — M1612 Unilateral primary osteoarthritis, left hip: Secondary | ICD-10-CM | POA: Diagnosis not present

## 2022-04-16 DIAGNOSIS — M5416 Radiculopathy, lumbar region: Secondary | ICD-10-CM | POA: Diagnosis not present

## 2022-04-18 DIAGNOSIS — L819 Disorder of pigmentation, unspecified: Secondary | ICD-10-CM | POA: Diagnosis not present

## 2022-04-18 DIAGNOSIS — I781 Nevus, non-neoplastic: Secondary | ICD-10-CM | POA: Diagnosis not present

## 2022-04-29 ENCOUNTER — Other Ambulatory Visit: Payer: Self-pay | Admitting: Neurology

## 2022-04-29 ENCOUNTER — Telehealth: Payer: Self-pay | Admitting: Neurology

## 2022-04-29 DIAGNOSIS — G250 Essential tremor: Secondary | ICD-10-CM

## 2022-04-29 NOTE — Telephone Encounter (Signed)
Pt called in stating he tried to get a refill of his gabapentin 300mg  and the pharmacy told him our office refused to fill it. He would like to find out why and what he should do?

## 2022-04-29 NOTE — Telephone Encounter (Signed)
Called patient and pharmacy and they are working on his prescription right now

## 2022-04-30 DIAGNOSIS — M25562 Pain in left knee: Secondary | ICD-10-CM | POA: Diagnosis not present

## 2022-04-30 DIAGNOSIS — M1612 Unilateral primary osteoarthritis, left hip: Secondary | ICD-10-CM | POA: Diagnosis not present

## 2022-05-09 ENCOUNTER — Ambulatory Visit: Payer: Medicare PPO | Admitting: Cardiology

## 2022-05-14 ENCOUNTER — Telehealth: Payer: Self-pay | Admitting: *Deleted

## 2022-05-14 NOTE — Telephone Encounter (Signed)
CALLED PATIENT TO INFORM OF CT FOR 05-28-22- ARRIVAL TIME- 1 PM @ [facility], PATIENT TO HAVE WATER ONLY - 4 HRS. PRIOR TO TEST, PATIENT TO HAVE HIS BLOOD DRAWN I-STAT IN RADIOLOGY, PATIENT TO RECEIVE RESULTS FROM ASHLYN BRUNING ON 05-29-22 @ 11:30 AM VIA TELEPHONE, SPOKE WITH PATIENT AND HE IS AWARE OF THESE APPTS. AND THE INSTRUCTIONS

## 2022-05-15 ENCOUNTER — Ambulatory Visit: Payer: Medicare PPO | Admitting: Cardiology

## 2022-05-15 DIAGNOSIS — M48062 Spinal stenosis, lumbar region with neurogenic claudication: Secondary | ICD-10-CM | POA: Diagnosis not present

## 2022-05-15 DIAGNOSIS — M545 Low back pain, unspecified: Secondary | ICD-10-CM | POA: Diagnosis not present

## 2022-05-15 DIAGNOSIS — M5416 Radiculopathy, lumbar region: Secondary | ICD-10-CM | POA: Diagnosis not present

## 2022-05-15 DIAGNOSIS — M5136 Other intervertebral disc degeneration, lumbar region: Secondary | ICD-10-CM | POA: Diagnosis not present

## 2022-05-15 DIAGNOSIS — M5459 Other low back pain: Secondary | ICD-10-CM | POA: Diagnosis not present

## 2022-05-17 ENCOUNTER — Encounter: Payer: Medicare PPO | Admitting: Cardiology

## 2022-05-17 DIAGNOSIS — I6523 Occlusion and stenosis of bilateral carotid arteries: Secondary | ICD-10-CM

## 2022-05-18 DIAGNOSIS — M5416 Radiculopathy, lumbar region: Secondary | ICD-10-CM | POA: Diagnosis not present

## 2022-05-21 ENCOUNTER — Other Ambulatory Visit: Payer: Self-pay | Admitting: Cardiology

## 2022-05-21 DIAGNOSIS — M5136 Other intervertebral disc degeneration, lumbar region: Secondary | ICD-10-CM | POA: Diagnosis not present

## 2022-05-21 DIAGNOSIS — I2510 Atherosclerotic heart disease of native coronary artery without angina pectoris: Secondary | ICD-10-CM

## 2022-05-21 DIAGNOSIS — M533 Sacrococcygeal disorders, not elsewhere classified: Secondary | ICD-10-CM | POA: Diagnosis not present

## 2022-05-22 DIAGNOSIS — M533 Sacrococcygeal disorders, not elsewhere classified: Secondary | ICD-10-CM | POA: Diagnosis not present

## 2022-05-28 ENCOUNTER — Ambulatory Visit (HOSPITAL_COMMUNITY)
Admission: RE | Admit: 2022-05-28 | Discharge: 2022-05-28 | Disposition: A | Discharge status: Home / Self Care | Payer: Medicare PPO | Source: Ambulatory Visit | Attending: Urology | Admitting: Urology

## 2022-05-28 ENCOUNTER — Encounter (HOSPITAL_COMMUNITY): Payer: Self-pay

## 2022-05-28 DIAGNOSIS — C3411 Malignant neoplasm of upper lobe, right bronchus or lung: Secondary | ICD-10-CM | POA: Diagnosis not present

## 2022-05-28 DIAGNOSIS — C3490 Malignant neoplasm of unspecified part of unspecified bronchus or lung: Secondary | ICD-10-CM | POA: Diagnosis not present

## 2022-05-28 DIAGNOSIS — J439 Emphysema, unspecified: Secondary | ICD-10-CM | POA: Diagnosis not present

## 2022-05-28 MED ORDER — IOHEXOL 300 MG/ML  SOLN
75.0000 mL | Freq: Once | INTRAMUSCULAR | Status: AC | PRN
  Administered 2022-05-28: 75 mL via INTRAVENOUS

## 2022-05-29 ENCOUNTER — Ambulatory Visit
Admission: RE | Admit: 2022-05-29 | Discharge: 2022-05-29 | Disposition: A | Discharge status: Home / Self Care | Payer: Medicare PPO | Source: Ambulatory Visit | Attending: Urology | Admitting: Urology

## 2022-05-29 ENCOUNTER — Encounter: Payer: Self-pay | Admitting: Urology

## 2022-05-29 DIAGNOSIS — C3411 Malignant neoplasm of upper lobe, right bronchus or lung: Secondary | ICD-10-CM

## 2022-05-29 DIAGNOSIS — J449 Chronic obstructive pulmonary disease, unspecified: Secondary | ICD-10-CM

## 2022-05-29 DIAGNOSIS — Z87891 Personal history of nicotine dependence: Secondary | ICD-10-CM | POA: Diagnosis not present

## 2022-05-29 NOTE — Progress Notes (Signed)
Radiation Oncology         (336) [phone] ________________________________  Name: Glen [name] MRN: [id number]  Date: 05/29/2022  DOB: 06-Feb-1941  Post Treatment Note  CC: [name], MD  [name], MD  Diagnosis:    82 year old male putative, Stage IA, NSCLC in the RUL lung  Interval Since Last Radiation:  7 months  10/10/21 - 10/16/21: SBRT//  The target in the RUL lung was treated to 54 Gy in 3 fractions of 18 Gy  Narrative:  I spoke with the patient to conduct his routine scheduled follow up visit via telephone to review his recent post-treatment CT Chest results to spare the patient unnecessary potential exposure in the healthcare setting during the current COVID-19 pandemic.  The patient was notified in advance and gave permission to proceed with this visit format.  He tolerated radiation treatment relatively well without any ill side effects aside from mild fatigue and mild dysphagia.                              On review of systems, the patient states that he is doing well in general.  He reports that the dysphagia has resolved completely and his energy level is gradually improving.  He has persistent, progressive shortness of breath and a non-productive cough (clear/white sputum in am) but denies chest pain, fever, chills or night sweats. He has continued using his inhalers for COPD which do not seem to significantly improve his breathing but he feels well otherwise.  His post-treatment Chest CT from 03/14/22 showed findings consistent with developing radiation pneumonitis and fibrosis but the treated nodule in the RUL appeared stable.  There was a new subpleural nodule of the medial right lower lobe measuring 1.4 x 0.8 cm that was nonspecific and felt most likely infectious or inflammatory, particularly in light of the rapid development as well as additional new more clearly infectious/inflammatory nodularity seen in the left lung base.  Therefore, we tried a course of  antibiotics and prednisone which he completed as prescribed and has not noticed significant improvement in his breathing although the productive cough has resolved. We obtained a short interval follow up scan to assess treatment effect as well as further evaluate the new nodule in the RLL. He had his repeat CT Chest on 05/28/22 and this shows the post radiation fibrosis in the right upper lobe is similar to that seen on the prior scan although it has become somewhat more confluent inferiorly with more prominent volume loss. The 9 x 4 mm right upper lobe pulmonary nodule measured previously is no longer discretely evident in the background scarring.  There has been interval resolution of the right lower lobe subpleural nodule and clustered tree-in-bud nodularity seen previously in the left lower lobe with no new suspicious pulmonary nodule or mass. We reviewed these results today.  ALLERGIES:  is allergic to aspirin, lisinopril, nsaids, and other.  Meds: Current Outpatient Medications  Medication Sig Dispense Refill   acetaminophen (TYLENOL) 650 MG CR tablet Take 1,950 mg by mouth at bedtime.     albuterol (PROAIR HFA) 108 (90 Base) MCG/ACT inhaler Inhale 2 puffs every 6 hours as needed (Patient taking differently: Inhale 2 puffs into the lungs every 6 (six) hours as needed for wheezing or shortness of breath. Inhale 2 puffs every 6 hours as needed) 8.5 g 12   ALPRAZolam (XANAX) 0.25 MG tablet Take 0.25 mg by mouth daily as needed  for anxiety.      amLODipine (NORVASC) 10 MG tablet Take 10 mg by mouth daily.     aspirin EC 81 MG tablet 1 tablet     atorvastatin (LIPITOR) 40 MG tablet Take 40 mg by mouth at bedtime.      Cyanocobalamin (VITAMIN B12) 1000 MCG TBCR 1 tablet     diphenhydrAMINE (BENADRYL) 25 MG tablet Take 50 mg by mouth at bedtime.     ezetimibe (ZETIA) 10 MG tablet TAKE 1 TABLET(10 MG) BY MOUTH DAILY 90 tablet 0   ferrous sulfate 325 (65 FE) MG tablet Take 325 mg by mouth daily.      fluticasone (FLONASE) 50 MCG/ACT nasal spray Place 2 sprays into both nostrils daily as needed for allergies.     gabapentin (NEURONTIN) 100 MG capsule TAKE 1 CAPSULE(100 MG) BY MOUTH EVERY MORNING 90 capsule 1   gabapentin (NEURONTIN) 300 MG capsule TAKE 2 CAPSULES(600 MG) BY MOUTH AT BEDTIME 180 capsule 0   hydrochlorothiazide (MICROZIDE) 12.5 MG capsule Take 12.5 mg by mouth daily.      levalbuterol (XOPENEX) 0.63 MG/3ML nebulizer solution Use 1 vial via nebulizer every 4 hours as needed for wheezing or shortness of breath. (Patient not taking: Reported on 03/22/2022) 425 mL 3   levofloxacin (LEVAQUIN) 750 MG tablet Take 1 tablet (750 mg total) by mouth daily. 7 tablet 0   omeprazole (PRILOSEC) 20 MG capsule Take 20 mg by mouth daily.     Polyethylene Glycol 3350 (MIRALAX PO) Take 17 g by mouth daily.     predniSONE (DELTASONE) 20 MG tablet Take 1 tablet (20 mg total) by mouth as directed. Beginning 5 days into taking antibiotics, take 3 tablets daily x5 days, then 2.5 daily x5 days, then 2 daily x5 days, then 1.5 daily x5 days, then 1 daily x5 days ,then 1/2 daily x5 days and stop. 53 tablet 0   primidone (MYSOLINE) 50 MG tablet 2 in the AM, 1 in the PM 270 tablet 3   sertraline (ZOLOFT) 100 MG tablet Take 100 mg by mouth daily.   4   telmisartan (MICARDIS) 40 MG tablet Take 40 mg by mouth daily.     TRELEGY ELLIPTA 100-62.5-25 MCG/ACT AEPB INHALE 1 PUFF INTO THE LUNGS DAILY 120 each 3   No current facility-administered medications for this encounter.    Physical Findings:  vitals were not taken for this visit.  Pain Assessment Pain Score: 0-No pain/10 Unable to assess due to telephone follow-up visit format.  Lab Findings: Lab Results  Component Value Date   WBC 5.4 07/25/2018   HGB 8.9 (L) 07/25/2018   HCT 27.7 (L) 07/25/2018   MCV 93.3 07/25/2018   PLT 245 07/25/2018     Radiographic Findings: CT Chest W Contrast  Result Date: 05/29/2022 CLINICAL DATA:  Non-small-cell lung  cancer. Restaging. * Tracking Code: BO * EXAM: CT CHEST WITH CONTRAST TECHNIQUE: Multidetector CT imaging of the chest was performed during intravenous contrast administration. RADIATION DOSE REDUCTION: This exam was performed according to the departmental dose-optimization program which includes automated exposure control, adjustment of the mA and/or kV according to patient size and/or use of iterative reconstruction technique. CONTRAST:  11mL OMNIPAQUE IOHEXOL 300 MG/ML  SOLN COMPARISON:  03/14/2022 FINDINGS: Cardiovascular: The heart size is normal. No substantial pericardial effusion. Coronary artery calcification is evident. Mild atherosclerotic calcification is noted in the wall of the thoracic aorta. Mediastinum/Nodes: No mediastinal lymphadenopathy. There is no hilar lymphadenopathy. The esophagus has normal imaging features. There  is no axillary lymphadenopathy. Lungs/Pleura: Centrilobular emphsyema noted. Post radiation fibrosis in the right upper lobe is similar to prior although it has become somewhat more confluent inferiorly with more prominent volume loss (compare image 58/5 today to image 63/5 previously. The 9 x 4 mm right upper lobe pulmonary nodule measured previously is no longer discretely evident in the background scarring. The 14 x 8 mm right lower lobe subpleural nodule described as new on the prior study has resolved in the interval. The clustered tree-in-bud nodularity seen previously in the left lower lobe has also resolved in the interval. No new suspicious pulmonary nodule or mass. No pneumothorax. No pleural effusion. Upper Abdomen: Visualized portion of the upper abdomen is unremarkable. Musculoskeletal: No worrisome lytic or sclerotic osseous abnormality. IMPRESSION: 1. Post radiation fibrosis in the right upper lobe is similar to prior although it has become somewhat more confluent inferiorly with more prominent volume loss. The increased confluence is felt to be related to the  associated volume loss, but attention in this region on follow-up recommended. 2. The 9 x 4 mm right upper lobe pulmonary nodule measured previously is no longer discretely evident in the background scarring. 3. Interval resolution of right lower lobe subpleural nodule and clustered tree-in-bud nodularity seen previously in the left lower lobe. No new suspicious pulmonary nodule or mass. 4. Emphysema (ICD10-J43.9) and Aortic Atherosclerosis (ICD10-170.0) Electronically Signed   By: Kennith Center M.D.   On: 05/29/2022 08:33    Impression/Plan: 61.  82 year old male putative, Stage IA, NSCLC in the RUL lung. He appears to be recovering from the effects of his recent stereotactic body radiotherapy (SBRT) but has continued with some progressive shortness of breath despite treatment with antibiotics and steroid taper.  His short interval CT chest scan from 05/28/22 shows the post radiation fibrosis in the right upper lobe is similar to that seen on the prior scan although it has become somewhat more confluent inferiorly with more prominent volume loss. The 9 x 4 mm right upper lobe pulmonary nodule measured previously is no longer discretely evident in the background scarring.  There has been interval resolution of the right lower lobe subpleural nodule and clustered tree-in-bud nodularity seen previously in the left lower lobe with no new suspicious pulmonary nodule or mass. I have recommended that he follow up with Dr. Tonia Brooms in pulmonology, to further assess his breathing issues to see if he needs an adjustment in his current COPD management.  We will proceed with serial CT Chest scans every 3-6 months going forward, to continue to monitor for any evidence of disease recurrence or progression and  will follow-up by phone to review the results and any recommendations following each scan.  He appears to have a good understanding of these recommendations and is comfortable and in agreement with the stated plan.  He  knows that he is welcome to call anytime in the interim with any questions or concerns related to his radiation.    Marguarite Arbour, PA-C

## 2022-05-29 NOTE — Progress Notes (Signed)
Telephone nursing interview for patient to receive most recent CT results from 05/28/22.  I verified patient's identity and began nursing interview. No related issues reported at this time.  Meaningful use complete.  Patient aware of their 11:30am-05/29/2022 telephone appointment w/ Ashlyn Bruning PA-C. I left my extension [phone] in case patient needs anything. Patient verbalized understanding.  This concludes the interview.   Ruel Favors, LPN

## 2022-05-30 ENCOUNTER — Telehealth: Payer: Self-pay | Admitting: *Deleted

## 2022-05-30 NOTE — Telephone Encounter (Signed)
Called patient to inform of appt. with Dr. Tonia Brooms on 06-04-22- arrival time- 11:15 am - address- [address], [address], spoke with patient and he is aware of this appt.

## 2022-05-31 DIAGNOSIS — M1711 Unilateral primary osteoarthritis, right knee: Secondary | ICD-10-CM | POA: Diagnosis not present

## 2022-05-31 DIAGNOSIS — M1712 Unilateral primary osteoarthritis, left knee: Secondary | ICD-10-CM | POA: Diagnosis not present

## 2022-06-04 ENCOUNTER — Encounter: Payer: Self-pay | Admitting: Pulmonary Disease

## 2022-06-04 ENCOUNTER — Ambulatory Visit: Payer: Medicare PPO | Admitting: Pulmonary Disease

## 2022-06-04 VITALS — BP 118/58 | HR 76 | Temp 98.4°F | Ht 67.0 in | Wt 200.8 lb

## 2022-06-04 DIAGNOSIS — J701 Chronic and other pulmonary manifestations due to radiation: Secondary | ICD-10-CM | POA: Diagnosis not present

## 2022-06-04 DIAGNOSIS — J449 Chronic obstructive pulmonary disease, unspecified: Secondary | ICD-10-CM | POA: Diagnosis not present

## 2022-06-04 DIAGNOSIS — R911 Solitary pulmonary nodule: Secondary | ICD-10-CM | POA: Diagnosis not present

## 2022-06-04 MED ORDER — BREZTRI AEROSPHERE 160-9-4.8 MCG/ACT IN AERO: 2.0000 | INHALATION_SPRAY | Freq: Two times a day (BID) | RESPIRATORY_TRACT | 0 refills | Status: DC

## 2022-06-04 NOTE — Patient Instructions (Addendum)
Thank you for visiting Dr. Tonia Brooms at [facility]. Today we recommend the following:  Stop trelegy  Start breztri with spacer, new prescription and samples  Continue to albuterol as needed  Start nebs twice daily   Return in about 8 weeks (around 07/30/2022) for with APP.    Please do your part to reduce the spread of COVID-19.

## 2022-06-04 NOTE — Progress Notes (Signed)
Synopsis: Referred in April 2023 for Lung nodule by Darrow Bussing, MD  Subjective:   PATIENT ID: Glen [name] GENDER: male DOB: Jan 25, 1941, MRN: [id number]  Chief Complaint  Patient presents with   Follow-up    Pt states he has increased SOB with exertion since he started radiation.     This is an 82 year old gentleman, past medical history of colon cancer, COPD, gastroesophageal reflux, sleep apnea on CPAP.  Follows with Dr. Maple Hudson in sleep clinic.CT imaging of the chest and abnormal PET scan.  Patient had a CT scan that was completed on 08/01/2021.  Patient was referred for abnormal he was found to have a new right upper lobe pulmonary nodule patient was sent for nuclear medicine PET scan on 08/29/2021.  This revealed a hypermetabolic uptake within the right upper lobe pulmonary nodule with no evidence of a distant metastatic disease.  Patient was referred today for evaluation of lung nodule and to discuss next best steps and consideration for bronchoscopy.  OV 06/04/2022: here today for follow up. Recent ct was reassuring. Still dealing with congestion and cough.  He feels a little bit better but has ongoing cough.  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  He has been using his inhaler regularly.  But still has daily congestion.  Rarely using his nebulized albuterol machine at home.  He does feel like it helps when does use it but has not been using it regularly.    Past Medical History:  Diagnosis Date   Allergic rhinitis, cause unspecified    Anxiety    Asthma    Cancer (HCC) 07/21/2018   colon   COPD (chronic obstructive pulmonary disease) (HCC)    Depression    Dyspnea    upon exertion   GERD (gastroesophageal reflux disease)    Hypertension    Pre-diabetes    PUD (peptic ulcer disease)    avoids aspirin   Sleep apnea    uses C-PAP     Family History  Problem Relation Age of Onset   Alcohol abuse Father         commited suicide   Heart disease Father        had bypass   Breast cancer Mother    Prostate cancer Maternal Uncle        several      Past Surgical History:  Procedure Laterality Date   BRONCHIAL BIOPSY  09/11/2021   Procedure: BRONCHIAL BIOPSIES;  Surgeon: Josephine Igo, DO;  Location: [facility];  Service: Pulmonary;;   BRONCHIAL BRUSHINGS  09/11/2021   Procedure: BRONCHIAL BRUSHINGS;  Surgeon: Josephine Igo, DO;  Location: [facility];  Service: Pulmonary;;   CATARACT EXTRACTION, BILATERAL     COLONOSCOPY     FIDUCIAL MARKER PLACEMENT  09/11/2021   Procedure: FIDUCIAL MARKER PLACEMENT;  Surgeon: Josephine Igo, DO;  Location: [facility];  Service: Pulmonary;;   LAPAROSCOPIC LYSIS OF ADHESIONS N/A 07/21/2018   Procedure: Laparoscopic Lysis Of Adhesions;  Surgeon: Claud Kelp, MD;  Location: [facility];  Service: General;  Laterality: N/A;   LAPAROSCOPIC PARTIAL COLECTOMY Left 07/21/2018   Procedure: LAPARASCOPIC TAKEDOWN OF SPLENIC FLEXURE; OPEN LEFT COLETCTOMY WITH PRIMARY ANASTOMOSIS;  Surgeon: Claud Kelp, MD;  Location: [facility];  Service: General;  Laterality: Left;   NASAL SEPTUM SURGERY  1960's   TONSILLECTOMY     UMBILICAL HERNIA REPAIR N/A 07/21/2018   Procedure: PRIMARY REPAIR OF UMBILICAL HERNIA , ADULT;  Surgeon: Fanny Skates, MD;  Location: [place];  Service: General;  Laterality: N/A;   VIDEO BRONCHOSCOPY WITH RADIAL ENDOBRONCHIAL ULTRASOUND  09/11/2021   Procedure: RADIAL ENDOBRONCHIAL ULTRASOUND;  Surgeon: Garner Nash, DO;  Location: [facility];  Service: Pulmonary;;    Social History   Socioeconomic History   Marital status: Married    Spouse name: Not on file   Number of children: 0   Years of education: Not on file   Highest education level: Bachelor's degree (e.g., BA, AB, BS)  Occupational History   Occupation: retired Psychiatric nurse  Tobacco Use   Smoking status: Former    Packs/day: 0.50    Types: Cigarettes    Quit  date: 04/12/2011    Years since quitting: 11.1   Smokeless tobacco: Never  Vaping Use   Vaping Use: Never used  Substance and Sexual Activity   Alcohol use: Yes    Alcohol/week: 0.0 - 6.0 standard drinks of alcohol    Comment: 1 liquor drink, 1 glass wine eveyr 2-3 weeks   Drug use: No   Sexual activity: Not on file  Other Topics Concern   Not on file  Social History Narrative   Left handed   One story home   Drinks caffeine   Social Determinants of Health   Financial Resource Strain: Not on file  Food Insecurity: Not on file  Transportation Needs: Not on file  Physical Activity: Not on file  Stress: Not on file  Social Connections: Not on file  Intimate Partner Violence: Not on file     Allergies  Allergen Reactions   Aspirin Other (See Comments)    pt has bleeding ulcers   Lisinopril Cough   Nsaids     Other reaction(s): GI upset   Other     GI Upset (intolerance)  AVOID NSAIDS     Outpatient Medications Prior to Visit  Medication Sig Dispense Refill   acetaminophen (TYLENOL) 650 MG CR tablet Take 1,950 mg by mouth at bedtime.     albuterol (PROAIR HFA) 108 (90 Base) MCG/ACT inhaler Inhale 2 puffs every 6 hours as needed (Patient taking differently: Inhale 2 puffs into the lungs every 6 (six) hours as needed for wheezing or shortness of breath. Inhale 2 puffs every 6 hours as needed) 8.5 g 12   ALPRAZolam (XANAX) 0.25 MG tablet Take 0.25 mg by mouth daily as needed for anxiety.      amLODipine (NORVASC) 10 MG tablet Take 10 mg by mouth daily.     aspirin EC 81 MG tablet 1 tablet     atorvastatin (LIPITOR) 40 MG tablet Take 40 mg by mouth at bedtime.      Cyanocobalamin (VITAMIN B12) 1000 MCG TBCR 1 tablet     diphenhydrAMINE (BENADRYL) 25 MG tablet Take 50 mg by mouth at bedtime.     ezetimibe (ZETIA) 10 MG tablet TAKE 1 TABLET(10 MG) BY MOUTH DAILY 90 tablet 0   ferrous sulfate 325 (65 FE) MG tablet Take 325 mg by mouth daily.     fluticasone (FLONASE) 50  MCG/ACT nasal spray Place 2 sprays into both nostrils daily as needed for allergies.     gabapentin (NEURONTIN) 100 MG capsule TAKE 1 CAPSULE(100 MG) BY MOUTH EVERY MORNING 90 capsule 1   gabapentin (NEURONTIN) 300 MG capsule TAKE 2 CAPSULES(600 MG) BY MOUTH AT BEDTIME 180 capsule 0   hydrochlorothiazide (MICROZIDE) 12.5 MG capsule Take 12.5 mg by mouth daily.      levalbuterol (  XOPENEX) 0.63 MG/3ML nebulizer solution Use 1 vial via nebulizer every 4 hours as needed for wheezing or shortness of breath. 425 mL 3   levofloxacin (LEVAQUIN) 750 MG tablet Take 1 tablet (750 mg total) by mouth daily. 7 tablet 0   omeprazole (PRILOSEC) 20 MG capsule Take 20 mg by mouth daily.     Polyethylene Glycol 3350 (MIRALAX PO) Take 17 g by mouth daily.     predniSONE (DELTASONE) 20 MG tablet Take 1 tablet (20 mg total) by mouth as directed. Beginning 5 days into taking antibiotics, take 3 tablets daily x5 days, then 2.5 daily x5 days, then 2 daily x5 days, then 1.5 daily x5 days, then 1 daily x5 days ,then 1/2 daily x5 days and stop. 53 tablet 0   primidone (MYSOLINE) 50 MG tablet 2 in the AM, 1 in the PM 270 tablet 3   sertraline (ZOLOFT) 100 MG tablet Take 100 mg by mouth daily.   4   telmisartan (MICARDIS) 40 MG tablet Take 40 mg by mouth daily.     TRELEGY ELLIPTA 100-62.5-25 MCG/ACT AEPB INHALE 1 PUFF INTO THE LUNGS DAILY 120 each 3   No facility-administered medications prior to visit.    Review of Systems  Constitutional:  Negative for chills, fever, malaise/fatigue and weight loss.  HENT:  Negative for hearing loss, sore throat and tinnitus.   Eyes:  Negative for blurred vision and double vision.  Respiratory:  Positive for cough, sputum production, shortness of breath and wheezing. Negative for hemoptysis and stridor.   Cardiovascular:  Negative for chest pain, palpitations, orthopnea, leg swelling and PND.  Gastrointestinal:  Negative for abdominal pain, constipation, diarrhea, heartburn, nausea and  vomiting.  Genitourinary:  Negative for dysuria, hematuria and urgency.  Musculoskeletal:  Negative for joint pain and myalgias.  Skin:  Negative for itching and rash.  Neurological:  Negative for dizziness, tingling, weakness and headaches.  Endo/Heme/Allergies:  Negative for environmental allergies. Does not bruise/bleed easily.  Psychiatric/Behavioral:  Negative for depression. The patient is not nervous/anxious and does not have insomnia.   All other systems reviewed and are negative.    Objective:  Physical Exam Vitals reviewed.  Constitutional:      General: He is not in acute distress.    Appearance: He is well-developed. He is obese.  HENT:     Head: Normocephalic and atraumatic.  Eyes:     General: No scleral icterus.    Conjunctiva/sclera: Conjunctivae normal.     Pupils: Pupils are equal, round, and reactive to light.  Neck:     Vascular: No JVD.     Trachea: No tracheal deviation.  Cardiovascular:     Rate and Rhythm: Normal rate and regular rhythm.     Heart sounds: Normal heart sounds. No murmur heard. Pulmonary:     Effort: Pulmonary effort is normal. No tachypnea, accessory muscle usage or respiratory distress.     Breath sounds: No stridor. No wheezing, rhonchi or rales.  Abdominal:     General: There is no distension.     Palpations: Abdomen is soft.     Tenderness: There is no abdominal tenderness.  Musculoskeletal:        General: No tenderness.     Cervical back: Neck supple.  Lymphadenopathy:     Cervical: No cervical adenopathy.  Skin:    General: Skin is warm and dry.     Capillary Refill: Capillary refill takes less than 2 seconds.     Findings: No rash.  Neurological:     Mental Status: He is alert and oriented to person, place, and time.  Psychiatric:        Behavior: Behavior normal.      Vitals:   06/04/22 1124  BP: (!) 118/58  Pulse: 76  Temp: 98.4 F (36.9 C)  TempSrc: Oral  SpO2: 93%  Weight: 200 lb 12.8 oz (91.1 kg)   Height: 5\' 7"  (1.702 m)   93% on RA   BMI Readings from Last 3 Encounters:  06/04/22 31.45 kg/m  03/22/22 30.76 kg/m  12/12/21 31.14 kg/m   Wt Readings from Last 3 Encounters:  06/04/22 200 lb 12.8 oz (91.1 kg)  03/22/22 196 lb 6.4 oz (89.1 kg)  12/12/21 198 lb 12.8 oz (90.2 kg)     CBC    Component Value Date/Time   WBC 5.4 07/25/2018 0330   RBC 2.97 (L) 07/25/2018 0330   HGB 8.9 (L) 07/25/2018 0330   HCT 27.7 (L) 07/25/2018 0330   PLT 245 07/25/2018 0330   MCV 93.3 07/25/2018 0330   MCH 30.0 07/25/2018 0330   MCHC 32.1 07/25/2018 0330   RDW 14.3 07/25/2018 0330   LYMPHSABS 1.2 07/17/2018 0929   MONOABS 0.6 07/17/2018 0929   EOSABS 0.3 07/17/2018 0929   BASOSABS 0.1 07/17/2018 0929     Chest Imaging: 08/29/2021 nuclear medicine pet imaging: Hypermetabolic right upper lobe pulmonary nodule concerning for primary bronchogenic carcinoma. The patient's images have been independently reviewed by me.    05/29/2022 CT chest: No evidence of recurrence of disease Evidence of radiation-induced fibrosis. The patient's images have been independently reviewed by me.    Pulmonary Functions Testing Results:     No data to display          FeNO:   Pathology:   Echocardiogram:   Heart Catheterization:     Assessment & Plan:     ICD-10-CM   1. Lung nodule  R91.1     2. COPD mixed type ([place])  J44.9     3. Radiation fibrosis of lung ([place])  J70.1       Discussion:  This is a 82 year old gentleman seen initially for an abnormal PET scan had a nodule concerning for malignancy was taken for bronchoscopy biopsy.  Was referred for SBRT.  Now has ongoing cough shortness of breath possibly related to his radiation induced fibrosis and underlying mixed COPD symptoms.  Plan: Stop Trelegy Start Breztri with samples and a spacer. Recommended starting nebulized treatments with albuterol in the morning and at night. Continue his regular airway clearance  techniques. His sputum production continues to be a problem can consider flutter valve in the future. Patient can return to clinic in a couple weeks to make sure he is doing better with his new inhaler regimen.    Current Outpatient Medications:    acetaminophen (TYLENOL) 650 MG CR tablet, Take 1,950 mg by mouth at bedtime., Disp: , Rfl:    albuterol (PROAIR HFA) 108 (90 Base) MCG/ACT inhaler, Inhale 2 puffs every 6 hours as needed (Patient taking differently: Inhale 2 puffs into the lungs every 6 (six) hours as needed for wheezing or shortness of breath. Inhale 2 puffs every 6 hours as needed), Disp: 8.5 g, Rfl: 12   ALPRAZolam (XANAX) 0.25 MG tablet, Take 0.25 mg by mouth daily as needed for anxiety. , Disp: , Rfl:    amLODipine (NORVASC) 10 MG tablet, Take 10 mg by mouth daily., Disp: , Rfl:    aspirin EC 81 MG  tablet, 1 tablet, Disp: , Rfl:    atorvastatin (LIPITOR) 40 MG tablet, Take 40 mg by mouth at bedtime. , Disp: , Rfl:    Cyanocobalamin (VITAMIN B12) 1000 MCG TBCR, 1 tablet, Disp: , Rfl:    diphenhydrAMINE (BENADRYL) 25 MG tablet, Take 50 mg by mouth at bedtime., Disp: , Rfl:    ezetimibe (ZETIA) 10 MG tablet, TAKE 1 TABLET(10 MG) BY MOUTH DAILY, Disp: 90 tablet, Rfl: 0   ferrous sulfate 325 (65 FE) MG tablet, Take 325 mg by mouth daily., Disp: , Rfl:    fluticasone (FLONASE) 50 MCG/ACT nasal spray, Place 2 sprays into both nostrils daily as needed for allergies., Disp: , Rfl:    gabapentin (NEURONTIN) 100 MG capsule, TAKE 1 CAPSULE(100 MG) BY MOUTH EVERY MORNING, Disp: 90 capsule, Rfl: 1   gabapentin (NEURONTIN) 300 MG capsule, TAKE 2 CAPSULES(600 MG) BY MOUTH AT BEDTIME, Disp: 180 capsule, Rfl: 0   hydrochlorothiazide (MICROZIDE) 12.5 MG capsule, Take 12.5 mg by mouth daily. , Disp: , Rfl:    levalbuterol (XOPENEX) 0.63 MG/3ML nebulizer solution, Use 1 vial via nebulizer every 4 hours as needed for wheezing or shortness of breath., Disp: 425 mL, Rfl: 3   levofloxacin (LEVAQUIN) 750  MG tablet, Take 1 tablet (750 mg total) by mouth daily., Disp: 7 tablet, Rfl: 0   omeprazole (PRILOSEC) 20 MG capsule, Take 20 mg by mouth daily., Disp: , Rfl:    Polyethylene Glycol 3350 (MIRALAX PO), Take 17 g by mouth daily., Disp: , Rfl:    predniSONE (DELTASONE) 20 MG tablet, Take 1 tablet (20 mg total) by mouth as directed. Beginning 5 days into taking antibiotics, take 3 tablets daily x5 days, then 2.5 daily x5 days, then 2 daily x5 days, then 1.5 daily x5 days, then 1 daily x5 days ,then 1/2 daily x5 days and stop., Disp: 53 tablet, Rfl: 0   primidone (MYSOLINE) 50 MG tablet, 2 in the AM, 1 in the PM, Disp: 270 tablet, Rfl: 3   sertraline (ZOLOFT) 100 MG tablet, Take 100 mg by mouth daily. , Disp: , Rfl: 4   telmisartan (MICARDIS) 40 MG tablet, Take 40 mg by mouth daily., Disp: , Rfl:    TRELEGY ELLIPTA 100-62.5-25 MCG/ACT AEPB, INHALE 1 PUFF INTO THE LUNGS DAILY, Disp: 120 each, Rfl: 3   Josephine Igo, DO [facility] 06/04/2022 11:38 AM

## 2022-06-11 DIAGNOSIS — M1712 Unilateral primary osteoarthritis, left knee: Secondary | ICD-10-CM | POA: Diagnosis not present

## 2022-06-18 DIAGNOSIS — M1712 Unilateral primary osteoarthritis, left knee: Secondary | ICD-10-CM | POA: Diagnosis not present

## 2022-06-25 DIAGNOSIS — M170 Bilateral primary osteoarthritis of knee: Secondary | ICD-10-CM | POA: Diagnosis not present

## 2022-06-25 DIAGNOSIS — M1711 Unilateral primary osteoarthritis, right knee: Secondary | ICD-10-CM | POA: Diagnosis not present

## 2022-06-26 ENCOUNTER — Other Ambulatory Visit: Payer: Self-pay | Admitting: Internal Medicine

## 2022-06-28 ENCOUNTER — Other Ambulatory Visit: Payer: Self-pay

## 2022-06-28 ENCOUNTER — Telehealth: Payer: Self-pay | Admitting: Pulmonary Disease

## 2022-06-28 DIAGNOSIS — J449 Chronic obstructive pulmonary disease, unspecified: Secondary | ICD-10-CM

## 2022-06-28 MED ORDER — BREZTRI AEROSPHERE 160-9-4.8 MCG/ACT IN AERO: 2.0000 | INHALATION_SPRAY | Freq: Two times a day (BID) | RESPIRATORY_TRACT | 3 refills | Status: DC

## 2022-06-28 NOTE — Telephone Encounter (Signed)
Patient stated he really like the Breztri inhaler and would like a prescription please advise if your okay with [place] sending this in for him?

## 2022-06-28 NOTE — Telephone Encounter (Signed)
Spoke with patient advised of Breztri prescription being sent to pharmacy. Nothing further needed

## 2022-07-02 DIAGNOSIS — M1711 Unilateral primary osteoarthritis, right knee: Secondary | ICD-10-CM | POA: Diagnosis not present

## 2022-07-09 ENCOUNTER — Other Ambulatory Visit: Payer: Self-pay | Admitting: Neurology

## 2022-07-09 DIAGNOSIS — G250 Essential tremor: Secondary | ICD-10-CM

## 2022-07-09 DIAGNOSIS — M1711 Unilateral primary osteoarthritis, right knee: Secondary | ICD-10-CM | POA: Diagnosis not present

## 2022-07-15 ENCOUNTER — Other Ambulatory Visit: Payer: Self-pay | Admitting: *Deleted

## 2022-07-15 DIAGNOSIS — I6523 Occlusion and stenosis of bilateral carotid arteries: Secondary | ICD-10-CM

## 2022-07-19 ENCOUNTER — Encounter: Payer: Self-pay | Admitting: Cardiology

## 2022-07-19 ENCOUNTER — Ambulatory Visit: Payer: Medicare PPO | Admitting: Cardiology

## 2022-07-19 VITALS — BP 141/64 | HR 85 | Resp 16 | Ht 67.0 in | Wt 204.0 lb

## 2022-07-19 DIAGNOSIS — I6523 Occlusion and stenosis of bilateral carotid arteries: Secondary | ICD-10-CM

## 2022-07-19 DIAGNOSIS — R0602 Shortness of breath: Secondary | ICD-10-CM | POA: Diagnosis not present

## 2022-07-19 DIAGNOSIS — R0609 Other forms of dyspnea: Secondary | ICD-10-CM | POA: Diagnosis not present

## 2022-07-19 NOTE — Progress Notes (Signed)
Patient referred by [name], MD for coronary calcification  Subjective:   Glen [name], male    DOB: [birth year], 82 y.o.   MRN: [id number]   Chief Complaint  Patient presents with   Bilateral carotid artery stenosis   Coronary Artery Disease   Follow-up     HPI  82 y.o. Caucasian male with hyperlipidemia, prediabetes, carotid artery disease, COPD, peripheral neuropathy, major depression, h/o colon cancer, h/o lung cancer with recently concluded radiation therapy  Since the diagnosis of lung cancer and subsequent radiation therapy, patient has had worsening exertional dyspnea and cough.  Recent CT scan showed significant amount of fibrosis.  He does not need oxygen, but gets short of breath with minimal activity.  He denies any chest pain.  On a separate note, he has upcoming follow-up with Dr. Scot Dock regarding carotid stenosis.    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 He enjoys playing the banjo and plays regularly with his friends. Patient recently underwent CT scan for monitoring of lung nodules. This showed Three-vessel coronary artery calcifications and atherosclerotic disease of aorta.  Patient denies any chest pain. He has stable, unchanged exertional dyspnea for several years. He is already on Aspirin and lipitor for carotid artery disease-asymptomatic moderate right carotid stenosis, managed by Dr. Doren Custard.   Current Outpatient Medications:    acetaminophen (TYLENOL) 650 MG CR tablet, Take 1,950 mg by mouth at bedtime., Disp: , Rfl:    albuterol (PROAIR HFA) 108 (90 Base) MCG/ACT inhaler, Inhale 2 puffs every 6 hours as needed (Patient taking differently: Inhale 2 puffs into the lungs every 6 (six) hours as needed for wheezing or shortness of breath. Inhale 2 puffs every 6 hours as needed), Disp: 8.5 g, Rfl: 12   ALPRAZolam (XANAX) 0.25 MG tablet, Take 0.25 mg by mouth daily as needed for  anxiety. , Disp: , Rfl:    amLODipine (NORVASC) 10 MG tablet, Take 10 mg by mouth daily., Disp: , Rfl:    aspirin EC 81 MG tablet, 1 tablet, Disp: , Rfl:    atorvastatin (LIPITOR) 40 MG tablet, Take 40 mg by mouth at bedtime. , Disp: , Rfl:    Budeson-Glycopyrrol-Formoterol (BREZTRI AEROSPHERE) 160-9-4.8 MCG/ACT AERO, Inhale 2 puffs into the lungs in the morning and at bedtime., Disp: 2 each, Rfl: 3   Cyanocobalamin (VITAMIN B12) 1000 MCG TBCR, 1 tablet, Disp: , Rfl:    diphenhydrAMINE (BENADRYL) 25 MG tablet, Take 50 mg by mouth at bedtime., Disp: , Rfl:    ezetimibe (ZETIA) 10 MG tablet, TAKE 1 TABLET(10 MG) BY MOUTH DAILY, Disp: 90 tablet, Rfl: 0   ferrous sulfate 325 (65 FE) MG tablet, Take 325 mg by mouth daily., Disp: , Rfl:    fluticasone (FLONASE) 50 MCG/ACT nasal spray, Place 2 sprays into both nostrils daily as needed for allergies., Disp: , Rfl:    gabapentin (NEURONTIN) 100 MG capsule, TAKE 1 CAPSULE(100 MG) BY MOUTH EVERY MORNING, Disp: 90 capsule, Rfl: 1   gabapentin (NEURONTIN) 300 MG capsule, TAKE 2 CAPSULES(600 MG) BY MOUTH AT BEDTIME, Disp: 180 capsule, Rfl: 0   hydrochlorothiazide (MICROZIDE) 12.5 MG capsule, Take 12.5 mg by mouth daily. , Disp: , Rfl:    levalbuterol (XOPENEX) 0.63 MG/3ML nebulizer solution, Use 1 vial via nebulizer every 4 hours as needed for wheezing or shortness of breath., Disp: 425 mL, Rfl: 3   levofloxacin (LEVAQUIN) 750 MG tablet, Take 1 tablet (750 mg total)  by mouth daily., Disp: 7 tablet, Rfl: 0   omeprazole (PRILOSEC) 20 MG capsule, Take 20 mg by mouth daily., Disp: , Rfl:    Polyethylene Glycol 3350 (MIRALAX PO), Take 17 g by mouth daily., Disp: , Rfl:    predniSONE (DELTASONE) 20 MG tablet, Take 1 tablet (20 mg total) by mouth as directed. Beginning 5 days into taking antibiotics, take 3 tablets daily x5 days, then 2.5 daily x5 days, then 2 daily x5 days, then 1.5 daily x5 days, then 1 daily x5 days ,then 1/2 daily x5 days and stop., Disp: 53 tablet,  Rfl: 0   primidone (MYSOLINE) 50 MG tablet, 2 in the AM, 1 in the PM, Disp: 270 tablet, Rfl: 3   sertraline (ZOLOFT) 100 MG tablet, Take 100 mg by mouth daily. , Disp: , Rfl: 4   telmisartan (MICARDIS) 40 MG tablet, Take 40 mg by mouth daily., Disp: , Rfl:    TRELEGY ELLIPTA 100-62.5-25 MCG/ACT AEPB, INHALE 1 PUFF INTO THE LUNGS DAILY, Disp: 120 each, Rfl: 3  Cardiovascular and other pertinent studies:  EKG 07/19/2022: Sinus rhythm 73 bpm Low voltage   Carotid duplex 05/2021: Right Carotid:  Velocities in the right ICA are consistent with a 60-79%  stenosis. Category of stenosis is the same as on the previous exam, however the velocities are less.  Left Carotid:  Velocities in the left ICA are consistent with a 1-39% stenosis.   Vertebrals: Bilateral vertebral arteries demonstrate antegrade flow.  Subclavians: Normal flow hemodynamics were seen in bilateral subclavian arteries.   CT chest 05/28/2022: 1. Post radiation fibrosis in the right upper lobe is similar to prior although it has become somewhat more confluent inferiorly with more prominent volume loss. The increased confluence is felt to be related to the associated volume loss, but attention in this region on follow-up recommended. 2. The 9 x 4 mm right upper lobe pulmonary nodule measured previously is no longer discretely evident in the background scarring. 3. Interval resolution of right lower lobe subpleural nodule and clustered tree-in-bud nodularity seen previously in the left lower lobe. No new suspicious pulmonary nodule or mass. 4. Emphysema (ICD10-J43.9) and Aortic Atherosclerosis (ICD10-170.0)      Carotid US 10/04/2020: Right Carotid: Velocities in the right ICA are consistent with a 60-79%                 stenosis. Calcified plaque with acoustic shadowing in the                 proximal segment may underestimate level of disease.  Left Carotid: Velocities in the left ICA are consistent with a 1-39%  stenosis.   Vertebrals:  Bilateral vertebral arteries demonstrate antegrade flow.  Subclavians: Normal flow hemodynamics were seen in bilateral subclavian               arteries.   Recent labs: 02/05/2022: Glucose 128, BUN/Cr 22/1.0. EGFR 74. K 4.3. Hb 12.7 HbA1C 6.1% Chol 106, TG 188, HDL 33, LDL 43 TSH 1.5 normal   Review of Systems  Cardiovascular:  Positive for dyspnea on exertion. Negative for chest pain, leg swelling, palpitations and syncope.         Vitals:   07/19/22 0945  BP: (!) 141/64  Pulse: 85  Resp: 16  SpO2: 94%    Body mass index is 31.95 kg/m. Filed Weights   07/19/22 0945  Weight: 204 lb (92.5 kg)     Objective:   Physical Exam Vitals and nursing note reviewed.  Constitutional:      General: He is not in acute distress. Neck:     Vascular: No JVD.  Cardiovascular:     Rate and Rhythm: Normal rate and regular rhythm.     Pulses:          Carotid pulses are  on the right side with bruit.    Heart sounds: Normal heart sounds. No murmur heard. Pulmonary:     Effort: Pulmonary effort is normal.     Breath sounds: Normal breath sounds. No wheezing or rales.  Musculoskeletal:     Right lower leg: No edema.     Left lower leg: No edema.          Assessment & Recommendations:   82 y.o. Caucasian male with hyperlipidemia, prediabetes, carotid artery disease, COPD, peripheral neuropathy, major depression, h/o colon cancer, h/o lung cancer with recently concluded radiation therapy  Coronary calcification: Incidental finding on CT chest. No angina symptoms at this time.  Recent increase in exertional dyspnea, with underlying COPD and lung cancer. Will obtain echocardiogram to rule out any component of congestive heart failure, although less likely. Mild exertional dyspnea stable for several years is likely due to COPD. Continue Aspirin, lipitor, zetia.  Lipids very well controlled,  Carotid artery disease: Bilateral carotid bruit.  Has follow-up  with Dr. Scot Dock  Hypertension: Mildly elevated today, generally well-controlled.  No changes made today.  F/u in 6 months    Nigel Mormon, MD Pager: [phone] Office: [phone]

## 2022-07-23 ENCOUNTER — Ambulatory Visit: Payer: Medicare PPO

## 2022-07-23 DIAGNOSIS — R0609 Other forms of dyspnea: Secondary | ICD-10-CM

## 2022-07-25 ENCOUNTER — Ambulatory Visit (HOSPITAL_COMMUNITY)
Admission: RE | Admit: 2022-07-25 | Discharge: 2022-07-25 | Disposition: A | Discharge status: Home / Self Care | Payer: Medicare PPO | Source: Ambulatory Visit | Attending: Vascular Surgery | Admitting: Vascular Surgery

## 2022-07-25 ENCOUNTER — Ambulatory Visit: Payer: Medicare PPO | Admitting: Physician Assistant

## 2022-07-25 VITALS — BP 118/53 | HR 72 | Temp 98.0°F | Resp 20 | Ht 67.0 in | Wt 201.7 lb

## 2022-07-25 DIAGNOSIS — I6523 Occlusion and stenosis of bilateral carotid arteries: Secondary | ICD-10-CM | POA: Insufficient documentation

## 2022-07-25 NOTE — Progress Notes (Signed)
Office Note     CC:  follow up Requesting Provider:  [name], MD  HPI: Glen [name] is a 82 y.o. (1940-08-23) male who presents for surveillance of carotid artery stenosis.  Since last office visit 6 months ago he denies any neurological events including slurring speech, changes in vision, or one-sided weakness.  He also denies any diagnosis of TIA or CVA.  He is a former smoker.  He is on a daily aspirin and statin.  He recently completed radiation for lung cancer and is currently in surveillance with CTs every 3 months with pulmonology.   Past Medical History:  Diagnosis Date   Allergic rhinitis, cause unspecified    Anxiety    Asthma    Cancer ([place]) 07/21/2018   colon   COPD (chronic obstructive pulmonary disease) (HCC)    Depression    Dyspnea    upon exertion   GERD (gastroesophageal reflux disease)    Hypertension    Pre-diabetes    PUD (peptic ulcer disease)    avoids aspirin   Sleep apnea    uses C-PAP    Past Surgical History:  Procedure Laterality Date   BRONCHIAL BIOPSY  09/11/2021   Procedure: BRONCHIAL BIOPSIES;  Surgeon: Garner Nash, DO;  Location: [facility];  Service: Pulmonary;;   BRONCHIAL BRUSHINGS  09/11/2021   Procedure: BRONCHIAL BRUSHINGS;  Surgeon: Garner Nash, DO;  Location: [facility];  Service: Pulmonary;;   CATARACT EXTRACTION, BILATERAL     COLONOSCOPY     FIDUCIAL MARKER PLACEMENT  09/11/2021   Procedure: FIDUCIAL MARKER PLACEMENT;  Surgeon: Garner Nash, DO;  Location: [facility];  Service: Pulmonary;;   LAPAROSCOPIC LYSIS OF ADHESIONS N/A 07/21/2018   Procedure: Laparoscopic Lysis Of Adhesions;  Surgeon: Fanny Skates, MD;  Location: [facility];  Service: General;  Laterality: N/A;   LAPAROSCOPIC PARTIAL COLECTOMY Left 07/21/2018   Procedure: LAPARASCOPIC TAKEDOWN OF SPLENIC FLEXURE; OPEN LEFT COLETCTOMY WITH PRIMARY ANASTOMOSIS;  Surgeon: Fanny Skates, MD;  Location: [facility];  Service: General;  Laterality: Left;    NASAL SEPTUM SURGERY  1960's   TONSILLECTOMY     UMBILICAL HERNIA REPAIR N/A 07/21/2018   Procedure: PRIMARY REPAIR OF UMBILICAL HERNIA , ADULT;  Surgeon: Fanny Skates, MD;  Location: [facility];  Service: General;  Laterality: N/A;   VIDEO BRONCHOSCOPY WITH RADIAL ENDOBRONCHIAL ULTRASOUND  09/11/2021   Procedure: RADIAL ENDOBRONCHIAL ULTRASOUND;  Surgeon: Garner Nash, DO;  Location: [facility];  Service: Pulmonary;;    Social History   Socioeconomic History   Marital status: Married    Spouse name: Not on file   Number of children: 0   Years of education: Not on file   Highest education level: Bachelor's degree (e.g., BA, AB, BS)  Occupational History   Occupation: retired Psychiatric nurse  Tobacco Use   Smoking status: Former    Packs/day: .5    Types: Cigarettes    Quit date: 04/12/2011    Years since quitting: 11.2    Passive exposure: Never   Smokeless tobacco: Never  Vaping Use   Vaping Use: Never used  Substance and Sexual Activity   Alcohol use: Yes    Alcohol/week: 0.0 - 6.0 standard drinks of alcohol    Comment: 1 liquor drink, 1 glass wine eveyr 2-3 weeks   Drug use: No   Sexual activity: Not on file  Other Topics Concern   Not on file  Social History Narrative   Left handed   One story  home   Drinks caffeine   Social Determinants of Health   Financial Resource Strain: Not on file  Food Insecurity: Not on file  Transportation Needs: Not on file  Physical Activity: Not on file  Stress: Not on file  Social Connections: Not on file  Intimate Partner Violence: Not on file   Family History  Problem Relation Age of Onset   Alcohol abuse Father        commited suicide   Heart disease Father        had bypass   Breast cancer Mother    Prostate cancer Maternal Uncle        several     Current Outpatient Medications  Medication Sig Dispense Refill   acetaminophen (TYLENOL) 650 MG CR tablet Take 1,950 mg by mouth at bedtime.     albuterol  (PROAIR HFA) 108 (90 Base) MCG/ACT inhaler Inhale 2 puffs every 6 hours as needed (Patient taking differently: Inhale 2 puffs into the lungs every 6 (six) hours as needed for wheezing or shortness of breath. Inhale 2 puffs every 6 hours as needed) 8.5 g 12   ALPRAZolam (XANAX) 0.25 MG tablet Take 0.25 mg by mouth daily as needed for anxiety.      amLODipine (NORVASC) 10 MG tablet Take 10 mg by mouth daily.     aspirin EC 81 MG tablet 1 tablet     atorvastatin (LIPITOR) 40 MG tablet Take 40 mg by mouth at bedtime.      Budeson-Glycopyrrol-Formoterol (BREZTRI AEROSPHERE) 160-9-4.8 MCG/ACT AERO Inhale 2 puffs into the lungs in the morning and at bedtime. 2 each 3   Cyanocobalamin (VITAMIN B12) 1000 MCG TBCR 1 tablet     diphenhydrAMINE (BENADRYL) 25 MG tablet Take 50 mg by mouth at bedtime.     ezetimibe (ZETIA) 10 MG tablet TAKE 1 TABLET(10 MG) BY MOUTH DAILY 90 tablet 0   ferrous sulfate 325 (65 FE) MG tablet Take 325 mg by mouth daily.     fluticasone (FLONASE) 50 MCG/ACT nasal spray Place 2 sprays into both nostrils daily as needed for allergies.     gabapentin (NEURONTIN) 100 MG capsule TAKE 1 CAPSULE(100 MG) BY MOUTH EVERY MORNING 90 capsule 1   gabapentin (NEURONTIN) 300 MG capsule TAKE 2 CAPSULES(600 MG) BY MOUTH AT BEDTIME 180 capsule 0   hydrochlorothiazide (MICROZIDE) 12.5 MG capsule Take 12.5 mg by mouth daily.      levalbuterol (XOPENEX) 0.63 MG/3ML nebulizer solution Use 1 vial via nebulizer every 4 hours as needed for wheezing or shortness of breath. 425 mL 3   omeprazole (PRILOSEC) 20 MG capsule Take 20 mg by mouth daily.     Polyethylene Glycol 3350 (MIRALAX PO) Take 17 g by mouth daily.     predniSONE (DELTASONE) 20 MG tablet Take 1 tablet (20 mg total) by mouth as directed. Beginning 5 days into taking antibiotics, take 3 tablets daily x5 days, then 2.5 daily x5 days, then 2 daily x5 days, then 1.5 daily x5 days, then 1 daily x5 days ,then 1/2 daily x5 days and stop. 53 tablet 0    primidone (MYSOLINE) 50 MG tablet 2 in the AM, 1 in the PM 270 tablet 3   sertraline (ZOLOFT) 100 MG tablet Take 100 mg by mouth daily.   4   telmisartan (MICARDIS) 40 MG tablet Take 40 mg by mouth daily.     No current facility-administered medications for this visit.    Allergies  Allergen Reactions   Aspirin Other (  See Comments)    pt has bleeding ulcers   Lisinopril Cough   Nsaids     Other reaction(s): GI upset   Other     GI Upset (intolerance)  AVOID NSAIDS     REVIEW OF SYSTEMS:   '[X]'$  denotes positive finding, '[ ]'$  denotes negative finding Cardiac  Comments:  Chest pain or chest pressure:    Shortness of breath upon exertion:    Short of breath when lying flat:    Irregular heart rhythm:        Vascular    Pain in calf, thigh, or hip brought on by ambulation:    Pain in feet at night that wakes you up from your sleep:     Blood clot in your veins:    Leg swelling:         Pulmonary    Oxygen at home:    Productive cough:     Wheezing:         Neurologic    Sudden weakness in arms or legs:     Sudden numbness in arms or legs:     Sudden onset of difficulty speaking or slurred speech:    Temporary loss of vision in one eye:     Problems with dizziness:         Gastrointestinal    Blood in stool:     Vomited blood:         Genitourinary    Burning when urinating:     Blood in urine:        Psychiatric    Major depression:         Hematologic    Bleeding problems:    Problems with blood clotting too easily:        Skin    Rashes or ulcers:        Constitutional    Fever or chills:      PHYSICAL EXAMINATION:  Vitals:   07/25/22 0919 07/25/22 0921  BP: 117/65 (!) 118/53  Pulse: 72   Resp: 20   Temp: 98 F (36.7 C)   TempSrc: Temporal   SpO2: 98%   Weight: 201 lb 11.2 oz (91.5 kg)   Height: '5\' 7"'$  (1.702 m)     General:  WDWN in NAD; vital signs documented above Gait: Not observed HENT: WNL, normocephalic Pulmonary: normal  non-labored breathing , without Rales, rhonchi,  wheezing Cardiac: regular HR Abdomen: soft, NT, no masses Skin: without rashes Vascular Exam/Pulses:  Right Left  Radial 2+ (normal) 2+ (normal)   Extremities: without ischemic changes, without Gangrene , without cellulitis; without open wounds;  Musculoskeletal: no muscle wasting or atrophy  Neurologic: A&O X 3; CN grossly intact Psychiatric:  The pt has Normal affect.   Non-Invasive Vascular Imaging:   Right ICA 60 to 79% stenosis Left ICA 1 to 39% stenosis    ASSESSMENT/PLAN:: 82 y.o. male here for follow up for surveillance of carotid artery stenosis  -Neuro exam remains at baseline without any neurological events since last office visit 6 months ago -Carotid duplex unchanged.  Right ICA 60 to 79% stenosis and left ICA 1 to 39% stenosis.  No indication for revascularization of asymptomatic right ICA stenosis -Continue statin and aspirin daily -Recheck duplex in another 6 months   Dagoberto Ligas, PA-C [facility] [phone]  Clinic MD:   Trula Slade

## 2022-07-26 ENCOUNTER — Other Ambulatory Visit: Payer: Self-pay | Admitting: Neurology

## 2022-07-26 DIAGNOSIS — G250 Essential tremor: Secondary | ICD-10-CM

## 2022-07-29 ENCOUNTER — Other Ambulatory Visit: Payer: Self-pay

## 2022-07-29 DIAGNOSIS — I6523 Occlusion and stenosis of bilateral carotid arteries: Secondary | ICD-10-CM

## 2022-07-29 NOTE — Telephone Encounter (Signed)
Patient has an appointment with Dr.Tat in August for 1 yr f/u, wondering why we are denying the prescription if he is scheduled in a timely manor.

## 2022-07-31 NOTE — Telephone Encounter (Addendum)
Patient is completely out, and is wondering why the prescription is being denied. Patient would like a call if possible.

## 2022-08-05 ENCOUNTER — Other Ambulatory Visit: Payer: Self-pay

## 2022-08-05 DIAGNOSIS — G250 Essential tremor: Secondary | ICD-10-CM

## 2022-08-05 MED ORDER — GABAPENTIN 300 MG PO CAPS: ORAL_CAPSULE | ORAL | 0 refills | Status: DC

## 2022-08-05 NOTE — Telephone Encounter (Signed)
Patient is calling in frustrated that he hasn't heard back and he has been out for the last couple of days. Asking if we are denying it then to tell him why.

## 2022-08-13 DIAGNOSIS — Z08 Encounter for follow-up examination after completed treatment for malignant neoplasm: Secondary | ICD-10-CM | POA: Diagnosis not present

## 2022-08-13 DIAGNOSIS — Z85038 Personal history of other malignant neoplasm of large intestine: Secondary | ICD-10-CM | POA: Diagnosis not present

## 2022-08-15 ENCOUNTER — Other Ambulatory Visit: Payer: Self-pay | Admitting: Surgery

## 2022-08-15 DIAGNOSIS — Z85038 Personal history of other malignant neoplasm of large intestine: Secondary | ICD-10-CM

## 2022-08-18 ENCOUNTER — Other Ambulatory Visit: Payer: Self-pay | Admitting: Cardiology

## 2022-08-18 DIAGNOSIS — I2510 Atherosclerotic heart disease of native coronary artery without angina pectoris: Secondary | ICD-10-CM

## 2022-08-20 DIAGNOSIS — M1712 Unilateral primary osteoarthritis, left knee: Secondary | ICD-10-CM | POA: Diagnosis not present

## 2022-08-21 ENCOUNTER — Ambulatory Visit
Admission: RE | Admit: 2022-08-21 | Discharge: 2022-08-21 | Disposition: A | Discharge status: Home / Self Care | Payer: Medicare PPO | Source: Ambulatory Visit | Attending: Surgery | Admitting: Surgery

## 2022-08-21 DIAGNOSIS — K4020 Bilateral inguinal hernia, without obstruction or gangrene, not specified as recurrent: Secondary | ICD-10-CM | POA: Diagnosis not present

## 2022-08-21 DIAGNOSIS — K5730 Diverticulosis of large intestine without perforation or abscess without bleeding: Secondary | ICD-10-CM | POA: Diagnosis not present

## 2022-08-21 DIAGNOSIS — Z85038 Personal history of other malignant neoplasm of large intestine: Secondary | ICD-10-CM

## 2022-08-21 DIAGNOSIS — Z85118 Personal history of other malignant neoplasm of bronchus and lung: Secondary | ICD-10-CM | POA: Diagnosis not present

## 2022-08-21 DIAGNOSIS — C189 Malignant neoplasm of colon, unspecified: Secondary | ICD-10-CM | POA: Diagnosis not present

## 2022-08-21 MED ORDER — IOPAMIDOL (ISOVUE-300) INJECTION 61%
100.0000 mL | Freq: Once | INTRAVENOUS | Status: AC | PRN
  Administered 2022-08-21: 100 mL via INTRAVENOUS

## 2022-09-02 NOTE — Therapy (Signed)
OUTPATIENT PHYSICAL THERAPY LOWER EXTREMITY EVALUATION   Patient Name: Jahziah Simonin ZOXWRUE MRN: [id number] DOB:March 31, 1941, 82 y.o., male Today's Date: 09/03/2022  END OF SESSION:  PT End of Session - 09/03/22 1019     Visit Number 1    Number of Visits 12    Date for PT Re-Evaluation 10/15/22    Authorization Type [employer] MCR    Progress Note Due on Visit 10    PT Start Time 1020    PT Stop Time 1100    PT Time Calculation (min) 40 min    Activity Tolerance Patient tolerated treatment well    Behavior During Therapy [facility] for tasks assessed/performed             Past Medical History:  Diagnosis Date   Allergic rhinitis, cause unspecified    Anxiety    Asthma    Cancer 07/21/2018   colon   COPD (chronic obstructive pulmonary disease)    Depression    Dyspnea    upon exertion   GERD (gastroesophageal reflux disease)    Hypertension    Pre-diabetes    PUD (peptic ulcer disease)    avoids aspirin   Sleep apnea    uses C-PAP   Past Surgical History:  Procedure Laterality Date   BRONCHIAL BIOPSY  09/11/2021   Procedure: BRONCHIAL BIOPSIES;  Surgeon: Josephine Igo, DO;  Location: [facility];  Service: Pulmonary;;   BRONCHIAL BRUSHINGS  09/11/2021   Procedure: BRONCHIAL BRUSHINGS;  Surgeon: Josephine Igo, DO;  Location: [facility];  Service: Pulmonary;;   CATARACT EXTRACTION, BILATERAL     COLONOSCOPY     FIDUCIAL MARKER PLACEMENT  09/11/2021   Procedure: FIDUCIAL MARKER PLACEMENT;  Surgeon: Josephine Igo, DO;  Location: [facility];  Service: Pulmonary;;   LAPAROSCOPIC LYSIS OF ADHESIONS N/A 07/21/2018   Procedure: Laparoscopic Lysis Of Adhesions;  Surgeon: Claud Kelp, MD;  [facility];  Service: General;  Laterality: N/A;   LAPAROSCOPIC PARTIAL COLECTOMY Left 07/21/2018   Procedure: LAPARASCOPIC TAKEDOWN OF SPLENIC FLEXURE; OPEN LEFT COLETCTOMY WITH PRIMARY ANASTOMOSIS;  Surgeon: Claud Kelp, MD;  [facility];  Service: General;  Laterality:  Left;   NASAL SEPTUM SURGERY  1960's   TONSILLECTOMY     UMBILICAL HERNIA REPAIR N/A 07/21/2018   Procedure: PRIMARY REPAIR OF UMBILICAL HERNIA , ADULT;  Surgeon: Claud Kelp, MD;  [facility];  Service: General;  Laterality: N/A;   VIDEO BRONCHOSCOPY WITH RADIAL ENDOBRONCHIAL ULTRASOUND  09/11/2021   Procedure: RADIAL ENDOBRONCHIAL ULTRASOUND;  Surgeon: Josephine Igo, DO;  Location: [facility];  Service: Pulmonary;;   Patient Active Problem List   Diagnosis Date Noted   Putative stage I cancer of right upper lobe of lung (HCC) 09/25/2021   Stenosis of right carotid artery 05/09/2021   Coronary artery disease involving native coronary artery of native heart without angina pectoris 02/07/2021   Mixed hyperlipidemia 02/07/2021   Colonic mass 07/21/2018   Obstructive sleep apnea 12/06/2016   Lung nodules 06/11/2015   Tobacco use disorder, mild, in sustained remission 01/01/2010   URTICARIA 01/01/2010   DYSPNEA 07/13/2009   P U D 06/15/2009   Essential hypertension 06/08/2009   ALLERGIC RHINITIS 06/08/2009   COPD mixed type 06/08/2009    PCP: Darrow Bussing, MD   REFERRING PROVIDER: Samson Frederic, MD   REFERRING DIAG:  M17.11 (ICD-10-CM) - Unilateral primary osteoarthritis, right knee  M25.561,M25.562 (ICD-10-CM) - Bilateral knee pain    THERAPY DIAG:  Chronic pain of both knees  Pain of both hip joints  Muscle weakness (generalized)  Cramp and spasm  Rationale for Evaluation and Treatment: Rehabilitation  ONSET DATE: years  SUBJECTIVE:   SUBJECTIVE STATEMENT: Came here in 2020 for PT for his knees. Has had 3 gel  injections in B knee(last 07/09/22) My pain runs across my B patellas. (Indicates lower quads). DN helped last time in PT.  When he starts walking he also feels pain in his hips. Limited to about 1/3 of a block and then hip joints start hurting. Long h/o of LBP but denies radicular pain. Prolonged sitting also hurts the R HS behind the  knee.  PERTINENT HISTORY: Neuropathy, COPD, HTN, anxiety/depression, lung cancer  PAIN:  Are you having pain? Yes: NPRS scale: 0 at rest 10 with sit to stand and steps /10 Pain location: Bil Knees (distal quads over patellas) Pain description: not ache or throb, it just hurts Aggravating factors: sitting to standing, stairs Relieving factors: rest  Are you having pain? Yes: NPRS scale: 0 at rest 8 with walking /10 Pain location: Bil hips  Pain description: ache Aggravating factors: prolonged walking Relieving factors: rest  PRECAUTIONS: Other: COPD/lung CA  WEIGHT BEARING RESTRICTIONS: No  FALLS:  Has patient fallen in last 6 months? Yes. Number of falls 1 desk chair rolled out from under him  LIVING ENVIRONMENT Lives with: lives with their spouse Lives in: House/apartment Stairs: Yes: Internal: 14 steps; on right going up and External: 5-6 steps; bilateral but cannot reach both Has following equipment at home: None  OCCUPATION: retired  PLOF: Independent  PATIENT GOALS: not to hurt  NEXT MD VISIT: not scheduled  OBJECTIVE:   DIAGNOSTIC FINDINGS:  OA right knee  PATIENT SURVEYS:  FOTO 37 (predicted 62)  COGNITION: Overall cognitive status: Within functional limits for tasks assessed     SENSATION: Numb halfway up shins B and toes are numb  MUSCLE LENGTH: HS: marked bil Quads: mild tightness bil ITB: neg ober Piriformis: R >L Hip Flexors: Heelcords: WNL   POSTURE: rounded shoulders, forward head, and tight left QL (plays banjo)  PALPATION: Palpation: TTP at B glut min/med and piriformis. Also bil ITBs.  Patellar Mobility:WNL     LOWER EXTREMITY ROM: WNL   LOWER EXTREMITY MMT: 5/5 in B LE except R hip flex 4+/5    LOWER EXTREMITY SPECIAL TESTS:  Hip special tests: Hip scouring test: negative  FUNCTIONAL TESTS:  5 times sit to stand: 10 sec but painful 3 minute walk test: TBD   TODAY'S TREATMENT:                                                                                                                               DATE:   09/03/22 See pt ed  PATIENT EDUCATION:  Education details: PT eval findings, anticipated POC, need for further assessment of gait, initial HEP, and role of DN  Person educated: Patient Education method: Explanation, Demonstration, and Handouts Education comprehension:  verbalized understanding and returned demonstration  HOME EXERCISE PROGRAM: Access Code: [email or web address] URL: [email or web address] Date: 09/03/2022 Prepared by: Raynelle Fanning  Exercises - Seated Hamstring Stretch with Chair  - 2 x daily - 7 x weekly - 1 sets - 3 reps - 30-60 sec hold - Seated Piriformis Stretch  - 1 x daily - 7 x weekly - 1 sets - 3 reps - 30-60 sec hold - Supine Piriformis Stretch with Foot on Ground  - 2 x daily - 7 x weekly - 1 sets - 3 reps - 30 sec hold  ASSESSMENT:  CLINICAL IMPRESSION: JUANCARLOS CRESCENZO  "Koren Bound" is a 82 y.o. male who was seen today for physical therapy evaluation and treatment for Bil knee pain beginning years ago. He also reports bil hip pain. Pain is worse in the knees with sit to stand transfers and steps. He has stairs inside and outside his home. His hips hurt when he walks more than 1/3 of a block and limits his ability function in the community and ambulate for exercise.  Koren Bound has deficits in hip and knee flexibility and hip flexor and core strength.He will benefit from skilled PT to address these deficits.   OBJECTIVE IMPAIRMENTS: decreased activity tolerance, difficulty walking, decreased strength, increased muscle spasms, impaired flexibility, postural dysfunction, and pain.   ACTIVITY LIMITATIONS: sitting, squatting, stairs, transfers, and locomotion level  PARTICIPATION LIMITATIONS: shopping and community activity  PERSONAL FACTORS: Age, Time since onset of injury/illness/exacerbation, and 3+ comorbidities: Neuropathy, COPD, HTN, anxiety/depression, lung cancer  are also  affecting patient's functional outcome.   REHAB POTENTIAL: Good  CLINICAL DECISION MAKING: Stable/uncomplicated  EVALUATION COMPLEXITY: Low   SHORT TERM GOALS: Target date: 09/17/2022    Ind with initial HEP Baseline: Goal status: INITIAL  2.  3 Min walk test to be completed by 2nd visit Baseline:  Goal status: INITIAL   LONG TERM GOALS: Target date: 10/15/2022   Ind with advanced HEP and its progression Baseline:  Goal status: INITIAL  2. Decreased pain in the B knees by >=50% with stairs and STS transfers to improve QOL. Baseline:  Goal status: INITIAL  3.  Decreased pain in B hips by >=50% with amb to improve access to community Baseline:  Goal status: INITIAL  4. Improved FOTO to 47 showing functional improvement Baseline: 37 Goal status: INITIAL        PLAN:  PT FREQUENCY: 2x/week  PT DURATION: 6 weeks  PLANNED INTERVENTIONS: Therapeutic exercises, Therapeutic activity, Neuromuscular re-education, Balance training, Gait training, Patient/Family education, Self Care, Joint mobilization, Stair training, Aquatic Therapy, Dry Needling, Electrical stimulation, Cryotherapy, Moist heat, Taping, Ionotophoresis /ml Dexamethasone, and Manual therapy  PLAN FOR NEXT SESSION: complete 3 min walk test and add goal if appropriate, DN/MT to quads, gluteals, core and functional strength, general flexibility.   Wilver Tignor, PT 09/03/2022, 1:38 PM

## 2022-09-03 ENCOUNTER — Ambulatory Visit: Payer: Medicare PPO | Attending: Orthopedic Surgery | Admitting: Physical Therapy

## 2022-09-03 ENCOUNTER — Encounter: Payer: Self-pay | Admitting: Physical Therapy

## 2022-09-03 ENCOUNTER — Other Ambulatory Visit: Payer: Self-pay

## 2022-09-03 DIAGNOSIS — M25562 Pain in left knee: Secondary | ICD-10-CM | POA: Diagnosis not present

## 2022-09-03 DIAGNOSIS — G8929 Other chronic pain: Secondary | ICD-10-CM | POA: Diagnosis not present

## 2022-09-03 DIAGNOSIS — M25561 Pain in right knee: Secondary | ICD-10-CM | POA: Insufficient documentation

## 2022-09-03 DIAGNOSIS — M25552 Pain in left hip: Secondary | ICD-10-CM | POA: Diagnosis not present

## 2022-09-03 DIAGNOSIS — M6281 Muscle weakness (generalized): Secondary | ICD-10-CM | POA: Insufficient documentation

## 2022-09-03 DIAGNOSIS — R252 Cramp and spasm: Secondary | ICD-10-CM | POA: Insufficient documentation

## 2022-09-03 DIAGNOSIS — M25551 Pain in right hip: Secondary | ICD-10-CM | POA: Insufficient documentation

## 2022-09-09 NOTE — Therapy (Signed)
OUTPATIENT PHYSICAL THERAPY TREATMENT   Patient Name: Glen [name] [id number] MRN: [id number] DOB:November 05, 1940, 82 y.o., male Today's Date: 09/10/2022  END OF SESSION:  PT End of Session - 09/10/22 0928     Visit Number 2    Number of Visits 12    Date for PT Re-Evaluation 10/15/22    Authorization Type [employer] MCR    Progress Note Due on Visit 10    PT Start Time 0930    PT Stop Time 1012    PT Time Calculation (min) 42 min    Activity Tolerance Patient tolerated treatment well    Behavior During Therapy WFL for tasks assessed/performed              Past Medical History:  Diagnosis Date   Allergic rhinitis, cause unspecified    Anxiety    Asthma    Cancer (HCC) 07/21/2018   colon   COPD (chronic obstructive pulmonary disease) (HCC)    Depression    Dyspnea    upon exertion   GERD (gastroesophageal reflux disease)    Hypertension    Pre-diabetes    PUD (peptic ulcer disease)    avoids aspirin   Sleep apnea    uses C-PAP   Past Surgical History:  Procedure Laterality Date   BRONCHIAL BIOPSY  09/11/2021   Procedure: BRONCHIAL BIOPSIES;  Surgeon: Josephine Igo, DO;  Location: [facility];  Service: Pulmonary;;   BRONCHIAL BRUSHINGS  09/11/2021   Procedure: BRONCHIAL BRUSHINGS;  Surgeon: Josephine Igo, DO;  Location: [facility];  Service: Pulmonary;;   CATARACT EXTRACTION, BILATERAL     COLONOSCOPY     FIDUCIAL MARKER PLACEMENT  09/11/2021   Procedure: FIDUCIAL MARKER PLACEMENT;  Surgeon: Josephine Igo, DO;  Location: [facility];  Service: Pulmonary;;   LAPAROSCOPIC LYSIS OF ADHESIONS N/A 07/21/2018   Procedure: Laparoscopic Lysis Of Adhesions;  Surgeon: Claud Kelp, MD;  Location: [facility];  Service: General;  Laterality: N/A;   LAPAROSCOPIC PARTIAL COLECTOMY Left 07/21/2018   Procedure: LAPARASCOPIC TAKEDOWN OF SPLENIC FLEXURE; OPEN LEFT COLETCTOMY WITH PRIMARY ANASTOMOSIS;  Surgeon: Claud Kelp, MD;  Location: [facility];  Service: General;  Laterality: Left;    NASAL SEPTUM SURGERY  1960's   TONSILLECTOMY     UMBILICAL HERNIA REPAIR N/A 07/21/2018   Procedure: PRIMARY REPAIR OF UMBILICAL HERNIA , ADULT;  Surgeon: Claud Kelp, MD;  Location: [facility];  Service: General;  Laterality: N/A;   VIDEO BRONCHOSCOPY WITH RADIAL ENDOBRONCHIAL ULTRASOUND  09/11/2021   Procedure: RADIAL ENDOBRONCHIAL ULTRASOUND;  Surgeon: Josephine Igo, DO;  Location: [facility];  Service: Pulmonary;;   Patient Active Problem List   Diagnosis Date Noted   Putative stage I cancer of right upper lobe of lung (HCC) 09/25/2021   Stenosis of right carotid artery 05/09/2021   Coronary artery disease involving native coronary artery of native heart without angina pectoris 02/07/2021   Mixed hyperlipidemia 02/07/2021   Colonic mass 07/21/2018   Obstructive sleep apnea 12/06/2016   Lung nodules 06/11/2015   Tobacco use disorder, mild, in sustained remission 01/01/2010   URTICARIA 01/01/2010   DYSPNEA 07/13/2009   P U D 06/15/2009   Essential hypertension 06/08/2009   ALLERGIC RHINITIS 06/08/2009   COPD mixed type (HCC) 06/08/2009    PCP: Darrow Bussing, MD   REFERRING PROVIDER: Samson Frederic, MD   REFERRING DIAG:  M17.11 (ICD-10-CM) - Unilateral primary osteoarthritis, right knee  M25.561,M25.562 (ICD-10-CM) - Bilateral knee pain    THERAPY DIAG:  Chronic pain of both  knees  Pain of both hip joints  Muscle weakness (generalized)  Cramp and spasm  Rationale for Evaluation and Treatment: Rehabilitation  ONSET DATE: years  SUBJECTIVE:   SUBJECTIVE STATEMENT: Patient reports increased back pain since yesterday. 7-8/10 in back. Knees the same.  PERTINENT HISTORY: Neuropathy, COPD, HTN, anxiety/depression, lung cancer  PAIN:  Are you having pain? Yes: NPRS scale: 0 at rest 10 with sit to stand and steps /10 Pain location: Bil Knees (distal quads over patellas) Pain description: not ache or throb, it just hurts Aggravating factors: sitting to  standing, stairs Relieving factors: rest  Are you having pain? Yes: NPRS scale: 0 at rest 8 with walking /10 Pain location: Bil hips  Pain description: ache Aggravating factors: prolonged walking Relieving factors: rest  PRECAUTIONS: Other: COPD/lung CA  WEIGHT BEARING RESTRICTIONS: No  FALLS:  Has patient fallen in last 6 months? Yes. Number of falls 1 desk chair rolled out from under him  LIVING ENVIRONMENT Lives with: lives with their spouse Lives in: House/apartment Stairs: Yes: Internal: 14 steps; on right going up and External: 5-6 steps; bilateral but cannot reach both Has following equipment at home: None  OCCUPATION: retired  PLOF: Independent  PATIENT GOALS: not to hurt  NEXT MD VISIT: not scheduled  OBJECTIVE:   DIAGNOSTIC FINDINGS:  OA right knee  PATIENT SURVEYS:  FOTO 37 (predicted 21)  COGNITION: Overall cognitive status: Within functional limits for tasks assessed     SENSATION: Numb halfway up shins B and toes are numb  MUSCLE LENGTH: HS: marked bil Quads: mild tightness bil ITB: neg ober Piriformis: R >L Hip Flexors: Heelcords: WNL   POSTURE: rounded shoulders, forward head, and tight left QL (plays banjo)  PALPATION: Palpation: TTP at B glut min/med and piriformis. Also bil ITBs.  Patellar Mobility:WNL     LOWER EXTREMITY ROM: WNL   LOWER EXTREMITY MMT: 5/5 in B LE except R hip flex 4+/5    LOWER EXTREMITY SPECIAL TESTS:  Hip special tests: Hip scouring test: negative  FUNCTIONAL TESTS:  5 times sit to stand: 10 sec but painful 3 minute walk test: 566 ft reports pain in bil hips at 2 min (400 ft)  09/10/22   TODAY'S TREATMENT:                                                                                                                              DATE:  09/10/22 - see objective Seated HS stretch 2x 30 sec  Seated fig 4 2x30 sec ea SLR x 10 bil Squat tap to mat table x 5 painful in med quads Manual Therapy:  to decrease muscle spasm and pain and improve mobility Skilled palpation and monitoring of soft tissues during DN STM to bil piriformis and gluteals Trigger Point Dry-Needling  Treatment instructions: Expect mild to moderate muscle soreness. S/S of pneumothorax if dry needled over a lung field, and to seek immediate medical attention should they occur.  Patient verbalized understanding of these instructions and education. Patient Consent Given: Yes Education handout provided: Previously provided Muscles treated: Bil piriformis and glute med/max Electrical stimulation performed: No Parameters: N/A Treatment response/outcome: Twitch Response Elicited and Palpable Increase in Muscle Length    09/03/22 See pt ed  PATIENT EDUCATION:  Education details: PT eval findings, anticipated POC, need for further assessment of gait, initial HEP, and role of DN  Person educated: Patient Education method: Explanation, Demonstration, and Handouts Education comprehension: verbalized understanding and returned demonstration  HOME EXERCISE PROGRAM: Access Code: [email or web address] URL: [email or web address] Date: 09/03/2022 Prepared by: Raynelle Fanning  Exercises - Seated Hamstring Stretch with Chair  - 2 x daily - 7 x weekly - 1 sets - 3 reps - 30-60 sec hold - Seated Piriformis Stretch  - 1 x daily - 7 x weekly - 1 sets - 3 reps - 30-60 sec hold - Supine Piriformis Stretch with Foot on Ground  - 2 x daily - 7 x weekly - 1 sets - 3 reps - 30 sec hold  ASSESSMENT:  CLINICAL IMPRESSION: Glen [name] did well with the 3 min walk test but reports bil hip pain at 2 min. He responded well to initial DN/MT to Bil hips and would benefit from ongoing DN to B lateral quads and MFR to bil ITB at his next visit. Increased low back pain was reported today of insidious onset and may have benefited from MT today as well.     OBJECTIVE IMPAIRMENTS: decreased activity tolerance, difficulty walking, decreased strength, increased  muscle spasms, impaired flexibility, postural dysfunction, and pain.   ACTIVITY LIMITATIONS: sitting, squatting, stairs, transfers, and locomotion level  PARTICIP/ATION LIMITATIONS: shopping and community activity  PERSONAL FACTORS: Age, Time since onset of injury/illness/exacerbation, and 3+ comorbidities: Neuropathy, COPD, HTN, anxiety/depression, lung cancer  are also affecting patient's functional outcome.   REHAB POTENTIAL: Good  CLINICAL DECISION MAKING: Stable/uncomplicated  EVALUATION COMPLEXITY: Low   SHORT TERM GOALS: Target date: 09/17/2022    Ind with initial HEP Baseline: Goal status: INITIAL  2.  3 Min walk test to be completed by 2nd visit Baseline:  Goal status: MET   LONG TERM GOALS: Target date: 10/15/2022   Ind with advanced HEP and its progression Baseline:  Goal status: INITIAL  2. Decreased pain in the B knees by >=50% with stairs and STS transfers to improve QOL. Baseline:  Goal status: INITIAL  3.  Decreased pain in B hips by >=50% with amb to improve access to community Baseline:  Goal status: INITIAL  4. Improved FOTO to 47 showing functional improvement Baseline: 37 Goal status: INITIAL        PLAN:  PT FREQUENCY: 2x/week  PT DURATION: 6 weeks  PLANNED INTERVENTIONS: Therapeutic exercises, Therapeutic activity, Neuromuscular re-education, Balance training, Gait training, Patient/Family education, Self Care, Joint mobilization, Stair training, Aquatic Therapy, Dry Needling, Electrical stimulation, Cryotherapy, Moist heat, Taping, Ionotophoresis 4mg /ml Dexamethasone, and Manual therapy  PLAN FOR NEXT SESSION:  DN/MT to quads, gluteals, core and functional strength, general flexibility.   Daneen Volcy, PT 09/10/2022, 2:30 PM

## 2022-09-10 ENCOUNTER — Encounter: Payer: Self-pay | Admitting: Physical Therapy

## 2022-09-10 ENCOUNTER — Ambulatory Visit: Payer: Medicare PPO | Admitting: Physical Therapy

## 2022-09-10 DIAGNOSIS — R252 Cramp and spasm: Secondary | ICD-10-CM

## 2022-09-10 DIAGNOSIS — M25561 Pain in right knee: Secondary | ICD-10-CM | POA: Diagnosis not present

## 2022-09-10 DIAGNOSIS — M25552 Pain in left hip: Secondary | ICD-10-CM

## 2022-09-10 DIAGNOSIS — M25551 Pain in right hip: Secondary | ICD-10-CM | POA: Diagnosis not present

## 2022-09-10 DIAGNOSIS — M25562 Pain in left knee: Secondary | ICD-10-CM | POA: Diagnosis not present

## 2022-09-10 DIAGNOSIS — G8929 Other chronic pain: Secondary | ICD-10-CM

## 2022-09-10 DIAGNOSIS — M6281 Muscle weakness (generalized): Secondary | ICD-10-CM | POA: Diagnosis not present

## 2022-09-11 ENCOUNTER — Ambulatory Visit: Payer: Medicare PPO | Admitting: Physical Therapy

## 2022-09-11 ENCOUNTER — Ambulatory Visit: Payer: Medicare PPO | Attending: Orthopedic Surgery | Admitting: Physical Therapy

## 2022-09-11 DIAGNOSIS — M25562 Pain in left knee: Secondary | ICD-10-CM | POA: Diagnosis not present

## 2022-09-11 DIAGNOSIS — M25551 Pain in right hip: Secondary | ICD-10-CM | POA: Diagnosis not present

## 2022-09-11 DIAGNOSIS — M6281 Muscle weakness (generalized): Secondary | ICD-10-CM | POA: Diagnosis not present

## 2022-09-11 DIAGNOSIS — M25561 Pain in right knee: Secondary | ICD-10-CM | POA: Insufficient documentation

## 2022-09-11 DIAGNOSIS — M25552 Pain in left hip: Secondary | ICD-10-CM | POA: Insufficient documentation

## 2022-09-11 DIAGNOSIS — R252 Cramp and spasm: Secondary | ICD-10-CM | POA: Diagnosis not present

## 2022-09-11 DIAGNOSIS — G8929 Other chronic pain: Secondary | ICD-10-CM | POA: Diagnosis not present

## 2022-09-11 NOTE — Therapy (Signed)
OUTPATIENT PHYSICAL THERAPY TREATMENT   Patient Name: Glen [name] ZOXWRUE MRN: [id number] DOB:July 14, 1940, 82 y.o., male Today's Date: 09/11/2022  END OF SESSION:  PT End of Session - 09/11/22 1055     Visit Number 3    Number of Visits 12    Date for PT Re-Evaluation 10/15/22    Authorization Type [employer] MCR 12 visits to 6/14    Progress Note Due on Visit 10    PT Start Time 1100    PT Stop Time 1140    PT Time Calculation (min) 40 min    Activity Tolerance Patient tolerated treatment well              Past Medical History:  Diagnosis Date   Allergic rhinitis, cause unspecified    Anxiety    Asthma    Cancer (HCC) 07/21/2018   colon   COPD (chronic obstructive pulmonary disease) (HCC)    Depression    Dyspnea    upon exertion   GERD (gastroesophageal reflux disease)    Hypertension    Pre-diabetes    PUD (peptic ulcer disease)    avoids aspirin   Sleep apnea    uses C-PAP   Past Surgical History:  Procedure Laterality Date   BRONCHIAL BIOPSY  09/11/2021   Procedure: BRONCHIAL BIOPSIES;  Surgeon: Josephine Igo, DO;  Location: [facility];  Service: Pulmonary;;   BRONCHIAL BRUSHINGS  09/11/2021   Procedure: BRONCHIAL BRUSHINGS;  Surgeon: Josephine Igo, DO;  Location: [facility];  Service: Pulmonary;;   CATARACT EXTRACTION, BILATERAL     COLONOSCOPY     FIDUCIAL MARKER PLACEMENT  09/11/2021   Procedure: FIDUCIAL MARKER PLACEMENT;  Surgeon: Josephine Igo, DO;  Location: [facility];  Service: Pulmonary;;   LAPAROSCOPIC LYSIS OF ADHESIONS N/A 07/21/2018   Procedure: Laparoscopic Lysis Of Adhesions;  Surgeon: Claud Kelp, MD;  Location: [facility];  Service: General;  Laterality: N/A;   LAPAROSCOPIC PARTIAL COLECTOMY Left 07/21/2018   Procedure: LAPARASCOPIC TAKEDOWN OF SPLENIC FLEXURE; OPEN LEFT COLETCTOMY WITH PRIMARY ANASTOMOSIS;  Surgeon: Claud Kelp, MD;  Location: [facility];  Service: General;  Laterality: Left;   NASAL SEPTUM SURGERY  1960's    TONSILLECTOMY     UMBILICAL HERNIA REPAIR N/A 07/21/2018   Procedure: PRIMARY REPAIR OF UMBILICAL HERNIA , ADULT;  Surgeon: Claud Kelp, MD;  Location: [facility];  Service: General;  Laterality: N/A;   VIDEO BRONCHOSCOPY WITH RADIAL ENDOBRONCHIAL ULTRASOUND  09/11/2021   Procedure: RADIAL ENDOBRONCHIAL ULTRASOUND;  Surgeon: Josephine Igo, DO;  Location: [facility];  Service: Pulmonary;;   Patient Active Problem List   Diagnosis Date Noted   Putative stage I cancer of right upper lobe of lung (HCC) 09/25/2021   Stenosis of right carotid artery 05/09/2021   Coronary artery disease involving native coronary artery of native heart without angina pectoris 02/07/2021   Mixed hyperlipidemia 02/07/2021   Colonic mass 07/21/2018   Obstructive sleep apnea 12/06/2016   Lung nodules 06/11/2015   Tobacco use disorder, mild, in sustained remission 01/01/2010   URTICARIA 01/01/2010   DYSPNEA 07/13/2009   P U D 06/15/2009   Essential hypertension 06/08/2009   ALLERGIC RHINITIS 06/08/2009   COPD mixed type (HCC) 06/08/2009    PCP: Darrow Bussing, MD   REFERRING PROVIDER: Samson Frederic, MD   REFERRING DIAG:  M17.11 (ICD-10-CM) - Unilateral primary osteoarthritis, right knee  M25.561,M25.562 (ICD-10-CM) - Bilateral knee pain    THERAPY DIAG:  Chronic pain of both knees  Pain of both hip  joints  Muscle weakness (generalized)  Rationale for Evaluation and Treatment: Rehabilitation  ONSET DATE: years  SUBJECTIVE:   SUBJECTIVE STATEMENT: "Are you going to needle my quads today?" The DN (yesterday) helped my back and hips.  I'm not sore there today.  I have a rowing machine at home but it hurts my back.    PERTINENT HISTORY: Neuropathy, COPD, HTN, anxiety/depression, lung cancer  PAIN: 5/1: knees 5/10 currently 8-9/10 with stairs Are you having pain? Yes: NPRS scale: 0 at rest 10 with sit to stand and steps /10 Pain location: Bil Knees (distal quads over patellas) Pain  description: not ache or throb, it just hurts Aggravating factors: sitting to standing, stairs Relieving factors: rest  Are you having pain? Yes: NPRS scale: 0 at rest 8 with walking /10 Pain location: Bil hips  Pain description: ache Aggravating factors: prolonged walking Relieving factors: rest  PRECAUTIONS: Other: COPD/lung CA  WEIGHT BEARING RESTRICTIONS: No  FALLS:  Has patient fallen in last 6 months? Yes. Number of falls 1 desk chair rolled out from under him  LIVING ENVIRONMENT Lives with: lives with their spouse Lives in: House/apartment Stairs: Yes: Internal: 14 steps; on right going up and External: 5-6 steps; bilateral but cannot reach both Has following equipment at home: None  OCCUPATION: retired  PLOF: Independent  PATIENT GOALS: not to hurt  NEXT MD VISIT: not scheduled  OBJECTIVE:   DIAGNOSTIC FINDINGS:  OA right knee  PATIENT SURVEYS:  FOTO 37 (predicted 89)  COGNITION: Overall cognitive status: Within functional limits for tasks assessed     SENSATION: Numb halfway up shins B and toes are numb  MUSCLE LENGTH: HS: marked bil Quads: mild tightness bil ITB: neg ober Piriformis: R >L Hip Flexors: Heelcords: WNL   POSTURE: rounded shoulders, forward head, and tight left QL (plays banjo)  PALPATION: Palpation: TTP at B glut min/med and piriformis. Also bil ITBs.  Patellar Mobility:WNL     LOWER EXTREMITY ROM: WNL   LOWER EXTREMITY MMT: 5/5 in B LE except R hip flex 4+/5    LOWER EXTREMITY SPECIAL TESTS:  Hip special tests: Hip scouring test: negative  FUNCTIONAL TESTS:  5 times sit to stand: 10 sec but painful 3 minute walk test: 566 ft reports pain in bil hips at 2 min (400 ft)  09/10/22   TODAY'S TREATMENT:                                                                                                                              DATE:  09/11/22 Nustep L1 5:30 while discussing status SLR x 5 bil Sit to stand from very high  table 2 sets of 5 (no pain) Seated quad isometric 5x 10 sec hold Manual Therapy: to decrease muscle spasm and pain and improve mobility Skilled palpation and monitoring of soft tissues during DN STM to bil quads Trigger Point Dry-Needling  Treatment instructions: Expect mild to moderate muscle soreness. S/S of pneumothorax  if dry needled over a lung field, and to seek immediate medical attention should they occur. Patient verbalized understanding of these instructions and education. Patient Consent Given: Yes Education handout provided: Previously provided Muscles treated: Bil quads Electrical stimulation performed:yes Parameters: 1.5 ma 8 min Treatment response/outcome: Twitch Response Elicited and Palpable Increase in Muscle Length        09/10/22 - see objective Seated HS stretch 2x 30 sec  Seated fig 4 2x30 sec ea SLR x 10 bil Squat tap to mat table x 5 painful in med quads Manual Therapy: to decrease muscle spasm and pain and improve mobility Skilled palpation and monitoring of soft tissues during DN STM to bil piriformis and gluteals Trigger Point Dry-Needling  Treatment instructions: Expect mild to moderate muscle soreness. S/S of pneumothorax if dry needled over a lung field, and to seek immediate medical attention should they occur. Patient verbalized understanding of these instructions and education. Patient Consent Given: Yes Education handout provided: Previously provided Muscles treated: Bil piriformis and glute med/max Electrical stimulation performed: No Parameters: N/A Treatment response/outcome: Twitch Response Elicited and Palpable Increase in Muscle Length    09/03/22 See pt ed  PATIENT EDUCATION:  Education details: PT eval findings, anticipated POC, need for further assessment of gait, initial HEP, and role of DN  Person educated: Patient Education method: Explanation, Demonstration, and Handouts Education comprehension: verbalized understanding  and returned demonstration  HOME EXERCISE PROGRAM: Access Code: [email or web address] URL: [email or web address] Date: 09/11/2022 Prepared by: Lavinia Sharps  Exercises - Seated Hamstring Stretch with Chair  - 2 x daily - 7 x weekly - 1 sets - 3 reps - 30-60 sec hold - Seated Piriformis Stretch  - 1 x daily - 7 x weekly - 1 sets - 3 reps - 30-60 sec hold - Supine Piriformis Stretch with Foot on Ground  - 2 x daily - 7 x weekly - 1 sets - 3 reps - 30 sec hold - Supine Straight Leg Raises  - 1 x daily - 7 x weekly - 1 sets - 10 reps - Sit to Stand with Arms Crossed  - 1 x daily - 7 x weekly - 1 sets - 10 reps - Seated Isometric Knee Extension  - 1 x daily - 7 x weekly - 1 sets - 10 reps ASSESSMENT:  CLINICAL IMPRESSION: Therapist progressing and updating HEP for quad muscle strengthening.   Good response to Nu-step with patient reporting no knee or back pain.  Trial of DN combined with ES to optimize effectiveness and decrease soreness.  Therapist monitoring response to all interventions and modifying treatment accordingly.     OBJECTIVE IMPAIRMENTS: decreased activity tolerance, difficulty walking, decreased strength, increased muscle spasms, impaired flexibility, postural dysfunction, and pain.   ACTIVITY LIMITATIONS: sitting, squatting, stairs, transfers, and locomotion level  PARTICIP/ATION LIMITATIONS: shopping and community activity  PERSONAL FACTORS: Age, Time since onset of injury/illness/exacerbation, and 3+ comorbidities: Neuropathy, COPD, HTN, anxiety/depression, lung cancer  are also affecting patient's functional outcome.   REHAB POTENTIAL: Good  CLINICAL DECISION MAKING: Stable/uncomplicated  EVALUATION COMPLEXITY: Low   SHORT TERM GOALS: Target date: 09/17/2022    Ind with initial HEP Baseline: Goal status: INITIAL  2.  3 Min walk test to be completed by 2nd visit Baseline:  Goal status: MET   LONG TERM GOALS: Target date: 10/15/2022   Ind with advanced HEP  and its progression Baseline:  Goal status: INITIAL  2. Decreased pain in the B knees by >=50% with stairs and STS  transfers to improve QOL. Baseline:  Goal status: INITIAL  3.  Decreased pain in B hips by >=50% with amb to improve access to community Baseline:  Goal status: INITIAL  4. Improved FOTO to 47 showing functional improvement Baseline: 37 Goal status: INITIAL        PLAN:  PT FREQUENCY: 2x/week  PT DURATION: 6 weeks  PLANNED INTERVENTIONS: Therapeutic exercises, Therapeutic activity, Neuromuscular re-education, Balance training, Gait training, Patient/Family education, Self Care, Joint mobilization, Stair training, Aquatic Therapy, Dry Needling, Electrical stimulation, Cryotherapy, Moist heat, Taping, Ionotophoresis 4mg /ml Dexamethasone, and Manual therapy  PLAN FOR NEXT SESSION:  assess response to DN with ES to quads, gluteals, core and functional strength, general flexibility; review HEP  Lavinia Sharps, PT 09/11/22 4:47 PM Phone: [phone] Fax: [phone]

## 2022-09-12 ENCOUNTER — Encounter: Payer: Medicare PPO | Admitting: Physical Therapy

## 2022-09-12 ENCOUNTER — Ambulatory Visit: Payer: Medicare PPO | Admitting: Physical Therapy

## 2022-09-12 NOTE — Therapy (Deleted)
OUTPATIENT PHYSICAL THERAPY TREATMENT   Patient Name: Glen [name] DGLOVFI MRN: [id number] DOB:1941-04-20, 82 y.o., male Today's Date: 09/12/2022  END OF SESSION:     Past Medical History:  Diagnosis Date   Allergic rhinitis, cause unspecified    Anxiety    Asthma    Cancer (HCC) 07/21/2018   colon   COPD (chronic obstructive pulmonary disease) (HCC)    Depression    Dyspnea    upon exertion   GERD (gastroesophageal reflux disease)    Hypertension    Pre-diabetes    PUD (peptic ulcer disease)    avoids aspirin   Sleep apnea    uses C-PAP   Past Surgical History:  Procedure Laterality Date   BRONCHIAL BIOPSY  09/11/2021   Procedure: BRONCHIAL BIOPSIES;  Surgeon: Josephine Igo, DO;  Location: [facility];  Service: Pulmonary;;   BRONCHIAL BRUSHINGS  09/11/2021   Procedure: BRONCHIAL BRUSHINGS;  Surgeon: Josephine Igo, DO;  Location: [facility];  Service: Pulmonary;;   CATARACT EXTRACTION, BILATERAL     COLONOSCOPY     FIDUCIAL MARKER PLACEMENT  09/11/2021   Procedure: FIDUCIAL MARKER PLACEMENT;  Surgeon: Josephine Igo, DO;  Location: [facility];  Service: Pulmonary;;   LAPAROSCOPIC LYSIS OF ADHESIONS N/A 07/21/2018   Procedure: Laparoscopic Lysis Of Adhesions;  Surgeon: Claud Kelp, MD;  Location: [facility];  Service: General;  Laterality: N/A;   LAPAROSCOPIC PARTIAL COLECTOMY Left 07/21/2018   Procedure: LAPARASCOPIC TAKEDOWN OF SPLENIC FLEXURE; OPEN LEFT COLETCTOMY WITH PRIMARY ANASTOMOSIS;  Surgeon: Claud Kelp, MD;  Location: [facility];  Service: General;  Laterality: Left;   NASAL SEPTUM SURGERY  1960's   TONSILLECTOMY     UMBILICAL HERNIA REPAIR N/A 07/21/2018   Procedure: PRIMARY REPAIR OF UMBILICAL HERNIA , ADULT;  Surgeon: Claud Kelp, MD;  Location: [facility];  Service: General;  Laterality: N/A;   VIDEO BRONCHOSCOPY WITH RADIAL ENDOBRONCHIAL ULTRASOUND  09/11/2021   Procedure: RADIAL ENDOBRONCHIAL ULTRASOUND;  Surgeon: Josephine Igo, DO;  Location: [facility];  Service: Pulmonary;;   Patient Active Problem List   Diagnosis Date Noted   Putative stage I cancer of right upper lobe of lung (HCC) 09/25/2021   Stenosis of right carotid artery 05/09/2021   Coronary artery disease involving native coronary artery of native heart without angina pectoris 02/07/2021   Mixed hyperlipidemia 02/07/2021   Colonic mass 07/21/2018   Obstructive sleep apnea 12/06/2016   Lung nodules 06/11/2015   Tobacco use disorder, mild, in sustained remission 01/01/2010   URTICARIA 01/01/2010   DYSPNEA 07/13/2009   P U D 06/15/2009   Essential hypertension 06/08/2009   ALLERGIC RHINITIS 06/08/2009   COPD mixed type (HCC) 06/08/2009    PCP: Darrow Bussing, MD   REFERRING PROVIDER: Samson Frederic, MD   REFERRING DIAG:  M17.11 (ICD-10-CM) - Unilateral primary osteoarthritis, right knee  M25.561,M25.562 (ICD-10-CM) - Bilateral knee pain    THERAPY DIAG:  No diagnosis found.  Rationale for Evaluation and Treatment: Rehabilitation  ONSET DATE: years  SUBJECTIVE:   SUBJECTIVE STATEMENT: "Are you going to needle my quads today?" The DN (yesterday) helped my back and hips.  I'm not sore there today.  I have a rowing machine at home but it hurts my back.    PERTINENT HISTORY: Neuropathy, COPD, HTN, anxiety/depression, lung cancer  PAIN: 5/1: knees 5/10 currently 8-9/10 with stairs Are you having pain? Yes: NPRS scale: 0 at rest 10 with sit to stand and steps /10 Pain location: Bil Knees (distal quads over patellas)  Pain description: not ache or throb, it just hurts Aggravating factors: sitting to standing, stairs Relieving factors: rest  Are you having pain? Yes: NPRS scale: 0 at rest 8 with walking /10 Pain location: Bil hips  Pain description: ache Aggravating factors: prolonged walking Relieving factors: rest  PRECAUTIONS: Other: COPD/lung CA  WEIGHT BEARING RESTRICTIONS: No  FALLS:  Has patient fallen in last 6 months? Yes. Number of  falls 1 desk chair rolled out from under him  LIVING ENVIRONMENT Lives with: lives with their spouse Lives in: House/apartment Stairs: Yes: Internal: 14 steps; on right going up and External: 5-6 steps; bilateral but cannot reach both Has following equipment at home: None  OCCUPATION: retired  PLOF: Independent  PATIENT GOALS: not to hurt  NEXT MD VISIT: not scheduled  OBJECTIVE:   DIAGNOSTIC FINDINGS:  OA right knee  PATIENT SURVEYS:  FOTO 37 (predicted 28)  COGNITION: Overall cognitive status: Within functional limits for tasks assessed     SENSATION: Numb halfway up shins B and toes are numb  MUSCLE LENGTH: HS: marked bil Quads: mild tightness bil ITB: neg ober Piriformis: R >L Hip Flexors: Heelcords: WNL   POSTURE: rounded shoulders, forward head, and tight left QL (plays banjo)  PALPATION: Palpation: TTP at B glut min/med and piriformis. Also bil ITBs.  Patellar Mobility:WNL     LOWER EXTREMITY ROM: WNL   LOWER EXTREMITY MMT: 5/5 in B LE except R hip flex 4+/5    LOWER EXTREMITY SPECIAL TESTS:  Hip special tests: Hip scouring test: negative  FUNCTIONAL TESTS:  5 times sit to stand: 10 sec but painful 3 minute walk test: 566 ft reports pain in bil hips at 2 min (400 ft)  09/10/22   TODAY'S TREATMENT:                                                                                                                              DATE:  09/11/22 Nustep L1 5:30 while discussing status SLR x 5 bil Sit to stand from very high table 2 sets of 5 (no pain) Seated quad isometric 5x 10 sec hold Manual Therapy: to decrease muscle spasm and pain and improve mobility Skilled palpation and monitoring of soft tissues during DN STM to bil quads Trigger Point Dry-Needling  Treatment instructions: Expect mild to moderate muscle soreness. S/S of pneumothorax if dry needled over a lung field, and to seek immediate medical attention should they occur. Patient  verbalized understanding of these instructions and education. Patient Consent Given: Yes Education handout provided: Previously provided Muscles treated: Bil quads Electrical stimulation performed:yes Parameters: 1.5 ma 8 min Treatment response/outcome: Twitch Response Elicited and Palpable Increase in Muscle Length        09/10/22 - see objective Seated HS stretch 2x 30 sec  Seated fig 4 2x30 sec ea SLR x 10 bil Squat tap to mat table x 5 painful in med quads Manual Therapy: to decrease muscle spasm and  pain and improve mobility Skilled palpation and monitoring of soft tissues during DN STM to bil piriformis and gluteals Trigger Point Dry-Needling  Treatment instructions: Expect mild to moderate muscle soreness. S/S of pneumothorax if dry needled over a lung field, and to seek immediate medical attention should they occur. Patient verbalized understanding of these instructions and education. Patient Consent Given: Yes Education handout provided: Previously provided Muscles treated: Bil piriformis and glute med/max Electrical stimulation performed: No Parameters: N/A Treatment response/outcome: Twitch Response Elicited and Palpable Increase in Muscle Length    09/03/22 See pt ed  PATIENT EDUCATION:  Education details: PT eval findings, anticipated POC, need for further assessment of gait, initial HEP, and role of DN  Person educated: Patient Education method: Explanation, Demonstration, and Handouts Education comprehension: verbalized understanding and returned demonstration  HOME EXERCISE PROGRAM: Access Code: [email or web address] URL: [email or web address] Date: 09/11/2022 Prepared by: Lavinia Sharps  Exercises - Seated Hamstring Stretch with Chair  - 2 x daily - 7 x weekly - 1 sets - 3 reps - 30-60 sec hold - Seated Piriformis Stretch  - 1 x daily - 7 x weekly - 1 sets - 3 reps - 30-60 sec hold - Supine Piriformis Stretch with Foot on Ground  - 2 x daily - 7  x weekly - 1 sets - 3 reps - 30 sec hold - Supine Straight Leg Raises  - 1 x daily - 7 x weekly - 1 sets - 10 reps - Sit to Stand with Arms Crossed  - 1 x daily - 7 x weekly - 1 sets - 10 reps - Seated Isometric Knee Extension  - 1 x daily - 7 x weekly - 1 sets - 10 reps ASSESSMENT:  CLINICAL IMPRESSION: Therapist progressing and updating HEP for quad muscle strengthening.   Good response to Nu-step with patient reporting no knee or back pain.  Trial of DN combined with ES to optimize effectiveness and decrease soreness.  Therapist monitoring response to all interventions and modifying treatment accordingly.     OBJECTIVE IMPAIRMENTS: decreased activity tolerance, difficulty walking, decreased strength, increased muscle spasms, impaired flexibility, postural dysfunction, and pain.   ACTIVITY LIMITATIONS: sitting, squatting, stairs, transfers, and locomotion level  PARTICIP/ATION LIMITATIONS: shopping and community activity  PERSONAL FACTORS: Age, Time since onset of injury/illness/exacerbation, and 3+ comorbidities: Neuropathy, COPD, HTN, anxiety/depression, lung cancer  are also affecting patient's functional outcome.   REHAB POTENTIAL: Good  CLINICAL DECISION MAKING: Stable/uncomplicated  EVALUATION COMPLEXITY: Low   SHORT TERM GOALS: Target date: 09/17/2022    Ind with initial HEP Baseline: Goal status: INITIAL  2.  3 Min walk test to be completed by 2nd visit Baseline:  Goal status: MET   LONG TERM GOALS: Target date: 10/15/2022   Ind with advanced HEP and its progression Baseline:  Goal status: INITIAL  2. Decreased pain in the B knees by >=50% with stairs and STS transfers to improve QOL. Baseline:  Goal status: INITIAL  3.  Decreased pain in B hips by >=50% with amb to improve access to community Baseline:  Goal status: INITIAL  4. Improved FOTO to 47 showing functional improvement Baseline: 37 Goal status: INITIAL        PLAN:  PT FREQUENCY:  2x/week  PT DURATION: 6 weeks  PLANNED INTERVENTIONS: Therapeutic exercises, Therapeutic activity, Neuromuscular re-education, Balance training, Gait training, Patient/Family education, Self Care, Joint mobilization, Stair training, Aquatic Therapy, Dry Needling, Electrical stimulation, Cryotherapy, Moist heat, Taping, Ionotophoresis 4mg /ml Dexamethasone, and Manual therapy  PLAN FOR NEXT  SESSION:  assess response to DN with ES to quads, gluteals, core and functional strength, general flexibility; review HEP  Royal Hawthorn PT, DPT 09/12/22  10:16 AM

## 2022-09-16 ENCOUNTER — Encounter: Payer: Medicare PPO | Admitting: Physical Therapy

## 2022-09-17 ENCOUNTER — Encounter: Payer: Self-pay | Admitting: Physical Therapy

## 2022-09-17 ENCOUNTER — Ambulatory Visit: Payer: Medicare PPO | Admitting: Physical Therapy

## 2022-09-17 DIAGNOSIS — M25551 Pain in right hip: Secondary | ICD-10-CM | POA: Diagnosis not present

## 2022-09-17 DIAGNOSIS — M25561 Pain in right knee: Secondary | ICD-10-CM | POA: Diagnosis not present

## 2022-09-17 DIAGNOSIS — R252 Cramp and spasm: Secondary | ICD-10-CM | POA: Diagnosis not present

## 2022-09-17 DIAGNOSIS — G8929 Other chronic pain: Secondary | ICD-10-CM | POA: Diagnosis not present

## 2022-09-17 DIAGNOSIS — M6281 Muscle weakness (generalized): Secondary | ICD-10-CM

## 2022-09-17 DIAGNOSIS — M25562 Pain in left knee: Secondary | ICD-10-CM | POA: Diagnosis not present

## 2022-09-17 DIAGNOSIS — M25552 Pain in left hip: Secondary | ICD-10-CM | POA: Diagnosis not present

## 2022-09-17 NOTE — Therapy (Signed)
OUTPATIENT PHYSICAL THERAPY TREATMENT   Patient Name: Glen [name] ZOXWRUE MRN: [id number] DOB:16-Jan-1941, 82 y.o., male Today's Date: 09/17/2022  END OF SESSION:  PT End of Session - 09/17/22 1101     Visit Number 4    Number of Visits 12    Date for PT Re-Evaluation 10/15/22    Authorization Type [employer] MCR 12 visits to 6/14    Progress Note Due on Visit 10    PT Start Time 1101    PT Stop Time 1145    PT Time Calculation (min) 44 min    Activity Tolerance Patient tolerated treatment well    Behavior During Therapy [facility] for tasks assessed/performed               Past Medical History:  Diagnosis Date   Allergic rhinitis, cause unspecified    Anxiety    Asthma    Cancer (HCC) 07/21/2018   colon   COPD (chronic obstructive pulmonary disease) (HCC)    Depression    Dyspnea    upon exertion   GERD (gastroesophageal reflux disease)    Hypertension    Pre-diabetes    PUD (peptic ulcer disease)    avoids aspirin   Sleep apnea    uses C-PAP   Past Surgical History:  Procedure Laterality Date   BRONCHIAL BIOPSY  09/11/2021   Procedure: BRONCHIAL BIOPSIES;  Surgeon: Josephine Igo, DO;  [facility]: [facility];  Service: Pulmonary;;   BRONCHIAL BRUSHINGS  09/11/2021   Procedure: BRONCHIAL BRUSHINGS;  Surgeon: Josephine Igo, DO;  [facility]: [facility];  Service: Pulmonary;;   CATARACT EXTRACTION, BILATERAL     COLONOSCOPY     FIDUCIAL MARKER PLACEMENT  09/11/2021   Procedure: FIDUCIAL MARKER PLACEMENT;  Surgeon: Josephine Igo, DO;  [facility]: [facility];  Service: Pulmonary;;   LAPAROSCOPIC LYSIS OF ADHESIONS N/A 07/21/2018   Procedure: Laparoscopic Lysis Of Adhesions;  Surgeon: Claud Kelp, MD;  Location: [facility];  Service: General;  Laterality: N/A;   LAPAROSCOPIC PARTIAL COLECTOMY Left 07/21/2018   Procedure: LAPARASCOPIC TAKEDOWN OF SPLENIC FLEXURE; OPEN LEFT COLETCTOMY WITH PRIMARY ANASTOMOSIS;  Surgeon: Claud Kelp, MD;  Location: [facility];  Service: General;   Laterality: Left;   NASAL SEPTUM SURGERY  1960's   TONSILLECTOMY     UMBILICAL HERNIA REPAIR N/A 07/21/2018   Procedure: PRIMARY REPAIR OF UMBILICAL HERNIA , ADULT;  Surgeon: Claud Kelp, MD;  Location: [facility];  Service: General;  Laterality: N/A;   VIDEO BRONCHOSCOPY WITH RADIAL ENDOBRONCHIAL ULTRASOUND  09/11/2021   Procedure: RADIAL ENDOBRONCHIAL ULTRASOUND;  Surgeon: Josephine Igo, DO;  [facility]: [facility];  Service: Pulmonary;;   Patient Active Problem List   Diagnosis Date Noted   Putative stage I cancer of right upper lobe of lung (HCC) 09/25/2021   Stenosis of right carotid artery 05/09/2021   Coronary artery disease involving native coronary artery of native heart without angina pectoris 02/07/2021   Mixed hyperlipidemia 02/07/2021   Colonic mass 07/21/2018   Obstructive sleep apnea 12/06/2016   Lung nodules 06/11/2015   Tobacco use disorder, mild, in sustained remission 01/01/2010   URTICARIA 01/01/2010   DYSPNEA 07/13/2009   P U D 06/15/2009   Essential hypertension 06/08/2009   ALLERGIC RHINITIS 06/08/2009   COPD mixed type (HCC) 06/08/2009    PCP: Darrow Bussing, MD   REFERRING PROVIDER: Darrow Bussing, MD   REFERRING DIAG:  M17.11 (ICD-10-CM) - Unilateral primary osteoarthritis, right knee  M25.561,M25.562 (ICD-10-CM) - Bilateral knee pain    THERAPY DIAG:  Chronic pain of both knees  Pain of both hip joints  Muscle weakness (generalized)  Rationale for Evaluation and Treatment: Rehabilitation  ONSET DATE: years  SUBJECTIVE:   SUBJECTIVE STATEMENT: The DN has helped - I'm about 50% better.  PERTINENT HISTORY: Neuropathy, COPD, HTN, anxiety/depression, lung cancer  PAIN: 5/7: knees 4-5/10 currently 8-9/10 with stairs Are you having pain? Yes: NPRS scale: 0 at rest 10 with sit to stand and steps /10 Pain location: Bil Knees (distal quads over patellas) Pain description: not ache or throb, it just hurts Aggravating factors: sitting to  standing, stairs Relieving factors: rest  Are you having pain? Yes: NPRS scale: 0 at rest 8 with walking /10 Pain location: Bil hips  Pain description: ache Aggravating factors: prolonged walking Relieving factors: rest  PRECAUTIONS: Other: COPD/lung CA  WEIGHT BEARING RESTRICTIONS: No  FALLS:  Has patient fallen in last 6 months? Yes. Number of falls 1 desk chair rolled out from under him  LIVING ENVIRONMENT Lives with: lives with their spouse Lives in: House/apartment Stairs: Yes: Internal: 14 steps; on right going up and External: 5-6 steps; bilateral but cannot reach both Has following equipment at home: None  OCCUPATION: retired  PLOF: Independent  PATIENT GOALS: not to hurt  NEXT MD VISIT: not scheduled  OBJECTIVE:   DIAGNOSTIC FINDINGS:  OA right knee  PATIENT SURVEYS:  FOTO 37 (predicted 28)  COGNITION: Overall cognitive status: Within functional limits for tasks assessed     SENSATION: Numb halfway up shins B and toes are numb  MUSCLE LENGTH: HS: marked bil Quads: mild tightness bil ITB: neg ober Piriformis: R >L Hip Flexors: Heelcords: WNL   POSTURE: rounded shoulders, forward head, and tight left QL (plays banjo)  PALPATION: Palpation: TTP at B glut min/med and piriformis. Also bil ITBs.  Patellar Mobility:WNL     LOWER EXTREMITY ROM: WNL   LOWER EXTREMITY MMT: 5/5 in B LE except R hip flex 4+/5    LOWER EXTREMITY SPECIAL TESTS:  Hip special tests: Hip scouring test: negative  FUNCTIONAL TESTS:  5 times sit to stand: 10 sec but painful 3 minute walk test: 566 ft reports pain in bil hips at 2 min (400 ft)  09/10/22   TODAY'S TREATMENT:                                                                                                                              DATE:  09/17/22: Gait down long hallway and back Trigger Point Dry-Needling  Treatment instructions: Expect mild to moderate muscle soreness. S/S of pneumothorax if dry  needled over a lung field, and to seek immediate medical attention should they occur. Patient verbalized understanding of these instructions and education.  Patient Consent Given: Yes Education handout provided: Previously provided Muscles treated: bil lumbar multifidi L3-L5, bil gluteals, bil quads Electrical stimulation performed: No Parameters: N/A Treatment response/outcome: signif tension Rt lumbar at L4, signif twitch and release bil hips and quads  STM Lt quad to address remaining soreness s/p DN  09/11/22 Nustep L1 5:30 while discussing status SLR x 5 bil Sit to stand from very high table 2 sets of 5 (no pain) Seated quad isometric 5x 10 sec hold Manual Therapy: to decrease muscle spasm and pain and improve mobility Skilled palpation and monitoring of soft tissues during DN STM to bil quads Trigger Point Dry-Needling  Treatment instructions: Expect mild to moderate muscle soreness. S/S of pneumothorax if dry needled over a lung field, and to seek immediate medical attention should they occur. Patient verbalized understanding of these instructions and education. Patient Consent Given: Yes Education handout provided: Previously provided Muscles treated: Bil quads Electrical stimulation performed:yes Parameters: 1.5 ma 8 min Treatment response/outcome: Twitch Response Elicited and Palpable Increase in Muscle Length  09/10/22 - see objective Seated HS stretch 2x 30 sec  Seated fig 4 2x30 sec ea SLR x 10 bil Squat tap to mat table x 5 painful in med quads Manual Therapy: to decrease muscle spasm and pain and improve mobility Skilled palpation and monitoring of soft tissues during DN STM to bil piriformis and gluteals Trigger Point Dry-Needling  Treatment instructions: Expect mild to moderate muscle soreness. S/S of pneumothorax if dry needled over a lung field, and to seek immediate medical attention should they occur. Patient verbalized understanding of these instructions  and education. Patient Consent Given: Yes Education handout provided: Previously provided Muscles treated: Bil piriformis and glute med/max Electrical stimulation performed: No Parameters: N/A Treatment response/outcome: Twitch Response Elicited and Palpable Increase in Muscle Length    09/03/22 See pt ed  PATIENT EDUCATION:  Education details: PT eval findings, anticipated POC, need for further assessment of gait, initial HEP, and role of DN  Person educated: Patient Education method: Explanation, Demonstration, and Handouts Education comprehension: verbalized understanding and returned demonstration  HOME EXERCISE PROGRAM: Access Code: [email or web address] URL: [email or web address] Date: 09/11/2022 Prepared by: Lavinia Sharps  Exercises - Seated Hamstring Stretch with Chair  - 2 x daily - 7 x weekly - 1 sets - 3 reps - 30-60 sec hold - Seated Piriformis Stretch  - 1 x daily - 7 x weekly - 1 sets - 3 reps - 30-60 sec hold - Supine Piriformis Stretch with Foot on Ground  - 2 x daily - 7 x weekly - 1 sets - 3 reps - 30 sec hold - Supine Straight Leg Raises  - 1 x daily - 7 x weekly - 1 sets - 10 reps - Sit to Stand with Arms Crossed  - 1 x daily - 7 x weekly - 1 sets - 10 reps - Seated Isometric Knee Extension  - 1 x daily - 7 x weekly - 1 sets - 10 reps ASSESSMENT:  CLINICAL IMPRESSION: Pt reports 50% improvement in pain since starting PT with DN feeling very effective.  PT repeated all muscle groups performed to date today and added lumbar multifidi bil as well given ongoing history of chronic lumbar symptoms which could be feeding into hypertonus of bil hips and quads.  Next session will focus on stretching, mobility and strength progression.  OBJECTIVE IMPAIRMENTS: decreased activity tolerance, difficulty walking, decreased strength, increased muscle spasms, impaired flexibility, postural dysfunction, and pain.   ACTIVITY LIMITATIONS: sitting, squatting, stairs, transfers,  and locomotion level  PARTICIP/ATION LIMITATIONS: shopping and community activity  PERSONAL FACTORS: Age, Time since onset of injury/illness/exacerbation, and 3+ comorbidities: Neuropathy, COPD, HTN, anxiety/depression, lung cancer  are also affecting patient's functional outcome.   REHAB  POTENTIAL: Good  CLINICAL DECISION MAKING: Stable/uncomplicated  EVALUATION COMPLEXITY: Low   SHORT TERM GOALS: Target date: 09/17/2022    Ind with initial HEP Baseline: Goal status: INITIAL  2.  3 Min walk test to be completed by 2nd visit Baseline:  Goal status: MET   LONG TERM GOALS: Target date: 10/15/2022   Ind with advanced HEP and its progression Baseline:  Goal status: INITIAL  2. Decreased pain in the B knees by >=50% with stairs and STS transfers to improve QOL. Baseline:  Goal status: INITIAL  3.  Decreased pain in B hips by >=50% with amb to improve access to community Baseline:  Goal status: INITIAL  4. Improved FOTO to 47 showing functional improvement Baseline: 37 Goal status: INITIAL        PLAN:  PT FREQUENCY: 2x/week  PT DURATION: 6 weeks  PLANNED INTERVENTIONS: Therapeutic exercises, Therapeutic activity, Neuromuscular re-education, Balance training, Gait training, Patient/Family education, Self Care, Joint mobilization, Stair training, Aquatic Therapy, Dry Needling, Electrical stimulation, Cryotherapy, Moist heat, Taping, Ionotophoresis 4mg /ml Dexamethasone, and Manual therapy  PLAN FOR NEXT SESSION:  f/u on DN quads, gluteals, lumbar multifidi, core and functional strength, general flexibility; review HEP  Cylus Douville, PT 09/17/22 1:09 PM  Phone: [phone] Fax: [phone]

## 2022-09-18 ENCOUNTER — Encounter: Payer: Medicare PPO | Admitting: Physical Therapy

## 2022-09-19 ENCOUNTER — Encounter: Payer: Self-pay | Admitting: Physical Therapy

## 2022-09-19 ENCOUNTER — Ambulatory Visit: Payer: Medicare PPO | Admitting: Physical Therapy

## 2022-09-19 DIAGNOSIS — M25552 Pain in left hip: Secondary | ICD-10-CM | POA: Diagnosis not present

## 2022-09-19 DIAGNOSIS — G8929 Other chronic pain: Secondary | ICD-10-CM | POA: Diagnosis not present

## 2022-09-19 DIAGNOSIS — R252 Cramp and spasm: Secondary | ICD-10-CM | POA: Diagnosis not present

## 2022-09-19 DIAGNOSIS — M6281 Muscle weakness (generalized): Secondary | ICD-10-CM

## 2022-09-19 DIAGNOSIS — M25562 Pain in left knee: Secondary | ICD-10-CM | POA: Diagnosis not present

## 2022-09-19 DIAGNOSIS — M25561 Pain in right knee: Secondary | ICD-10-CM | POA: Diagnosis not present

## 2022-09-19 DIAGNOSIS — M25551 Pain in right hip: Secondary | ICD-10-CM | POA: Diagnosis not present

## 2022-09-19 NOTE — Therapy (Signed)
OUTPATIENT PHYSICAL THERAPY TREATMENT   Patient Name: Glen [name] [id number] MRN: [id number] DOB:02/24/1941, 82 y.o., male Today's Date: 09/19/2022  END OF SESSION:  PT End of Session - 09/19/22 1054     Visit Number 5    Number of Visits 12    Date for PT Re-Evaluation 10/15/22    Authorization Type [employer] MCR 12 visits to 6/14    Progress Note Due on Visit 10    PT Start Time 1057    PT Stop Time 1145    PT Time Calculation (min) 48 min    Activity Tolerance Patient tolerated treatment well    Behavior During Therapy [facility] for tasks assessed/performed                Past Medical History:  Diagnosis Date   Allergic rhinitis, cause unspecified    Anxiety    Asthma    Cancer (HCC) 07/21/2018   colon   COPD (chronic obstructive pulmonary disease) (HCC)    Depression    Dyspnea    upon exertion   GERD (gastroesophageal reflux disease)    Hypertension    Pre-diabetes    PUD (peptic ulcer disease)    avoids aspirin   Sleep apnea    uses C-PAP   Past Surgical History:  Procedure Laterality Date   BRONCHIAL BIOPSY  09/11/2021   Procedure: BRONCHIAL BIOPSIES;  Surgeon: Josephine Igo, DO;  Location: [facility];  Service: Pulmonary;;   BRONCHIAL BRUSHINGS  09/11/2021   Procedure: BRONCHIAL BRUSHINGS;  Surgeon: Josephine Igo, DO;  Location: [facility];  Service: Pulmonary;;   CATARACT EXTRACTION, BILATERAL     COLONOSCOPY     FIDUCIAL MARKER PLACEMENT  09/11/2021   Procedure: FIDUCIAL MARKER PLACEMENT;  Surgeon: Josephine Igo, DO;  Location: [facility];  Service: Pulmonary;;   LAPAROSCOPIC LYSIS OF ADHESIONS N/A 07/21/2018   Procedure: Laparoscopic Lysis Of Adhesions;  Surgeon: Claud Kelp, MD;  Location: [facility] OR;  Service: General;  Laterality: N/A;   LAPAROSCOPIC PARTIAL COLECTOMY Left 07/21/2018   Procedure: LAPARASCOPIC TAKEDOWN OF SPLENIC FLEXURE; OPEN LEFT COLETCTOMY WITH PRIMARY ANASTOMOSIS;  Surgeon: Claud Kelp, MD;  Location: [facility];  Service: General;   Laterality: Left;   NASAL SEPTUM SURGERY  1960's   TONSILLECTOMY     UMBILICAL HERNIA REPAIR N/A 07/21/2018   Procedure: PRIMARY REPAIR OF UMBILICAL HERNIA , ADULT;  Surgeon: Claud Kelp, MD;  Location: [facility];  Service: General;  Laterality: N/A;   VIDEO BRONCHOSCOPY WITH RADIAL ENDOBRONCHIAL ULTRASOUND  09/11/2021   Procedure: RADIAL ENDOBRONCHIAL ULTRASOUND;  Surgeon: Josephine Igo, DO;  Location: [facility];  Service: Pulmonary;;   Patient Active Problem List   Diagnosis Date Noted   Putative stage I cancer of right upper lobe of lung (HCC) 09/25/2021   Stenosis of right carotid artery 05/09/2021   Coronary artery disease involving native coronary artery of native heart without angina pectoris 02/07/2021   Mixed hyperlipidemia 02/07/2021   Colonic mass 07/21/2018   Obstructive sleep apnea 12/06/2016   Lung nodules 06/11/2015   Tobacco use disorder, mild, in sustained remission 01/01/2010   URTICARIA 01/01/2010   DYSPNEA 07/13/2009   P U D 06/15/2009   Essential hypertension 06/08/2009   ALLERGIC RHINITIS 06/08/2009   COPD mixed type (HCC) 06/08/2009    PCP: Darrow Bussing, MD   REFERRING PROVIDER: Darrow Bussing, MD   REFERRING DIAG:  M17.11 (ICD-10-CM) - Unilateral primary osteoarthritis, right knee  M25.561,M25.562 (ICD-10-CM) - Bilateral knee pain    THERAPY  DIAG:  Chronic pain of both knees  Pain of both hip joints  Muscle weakness (generalized)  Cramp and spasm  Rationale for Evaluation and Treatment: Rehabilitation  ONSET DATE: years  SUBJECTIVE:   SUBJECTIVE STATEMENT: I am sore in the thighs from the DN.  My Rt>Lt kneecap continues to hurt. My trunk is very stiff for reaching to finish up toileting needs.  PERTINENT HISTORY: Neuropathy, COPD, HTN, anxiety/depression, lung cancer  PAIN: 5/7: knees 4-5/10 currently 8-9/10 with stairs Are you having pain? Yes: NPRS scale: 0 at rest 10 with sit to stand and steps /10 Pain location: Bil Knees  (distal quads over patellas) Pain description: not ache or throb, it just hurts Aggravating factors: sitting to standing, stairs Relieving factors: rest  Are you having pain? Yes: NPRS scale: 0 at rest 8 with walking /10 Pain location: Bil hips  Pain description: ache Aggravating factors: prolonged walking Relieving factors: rest  PRECAUTIONS: Other: COPD/lung CA  WEIGHT BEARING RESTRICTIONS: No  FALLS:  Has patient fallen in last 6 months? Yes. Number of falls 1 desk chair rolled out from under him  LIVING ENVIRONMENT Lives with: lives with their spouse Lives in: House/apartment Stairs: Yes: Internal: 14 steps; on right going up and External: 5-6 steps; bilateral but cannot reach both Has following equipment at home: None  OCCUPATION: retired  PLOF: Independent  PATIENT GOALS: not to hurt  NEXT MD VISIT: not scheduled  OBJECTIVE:   DIAGNOSTIC FINDINGS:  OA right knee  PATIENT SURVEYS:  FOTO 37 (predicted 39)  COGNITION: Overall cognitive status: Within functional limits for tasks assessed     SENSATION: Numb halfway up shins B and toes are numb  MUSCLE LENGTH: HS: marked bil Quads: mild tightness bil ITB: neg ober Piriformis: R >L Hip Flexors: Heelcords: WNL   POSTURE: rounded shoulders, forward head, and tight left QL (plays banjo)  PALPATION: Palpation: TTP at B glut min/med and piriformis. Also bil ITBs.  Patellar Mobility:WNL     LOWER EXTREMITY ROM: WNL   LOWER EXTREMITY MMT: 5/5 in B LE except R hip flex 4+/5    LOWER EXTREMITY SPECIAL TESTS:  Hip special tests: Hip scouring test: negative  FUNCTIONAL TESTS:  5 times sit to stand: 10 sec but painful 3 minute walk test: 566 ft reports pain in bil hips at 2 min (400 ft)  09/10/22   TODAY'S TREATMENT:                                                                                                                              DATE:  09/19/22: NuStep L6 x 5' PT present to discuss status  and plan for session Seated ball rollouts trunk flexion x10, flexion with SB x 5 each Seated trunk rotation 2x10" bil Seated trunk SB with overhead reach 2x10 bil Open book x10 each way Supine HS stretch and ITB stretch with strap 2x20" bil Supine bridge 2x10 Supine SLR x10 bil Hamstring curls  feet on red ball roll in/out Deadlift 10lb x15 Standing T at counter 1x5 bil Sidestepping along counter yellow loop at ankles x3 passes Quad stretch bil standing foot on chair bil UE rail 2x20" Hip flexor stretch foot on 2nd step 2x20" bil Wall sit 1/4 depth 2x20"  09/17/22: Gait down long hallway and back Trigger Point Dry-Needling  Treatment instructions: Expect mild to moderate muscle soreness. S/S of pneumothorax if dry needled over a lung field, and to seek immediate medical attention should they occur. Patient verbalized understanding of these instructions and education.  Patient Consent Given: Yes Education handout provided: Previously provided Muscles treated: bil lumbar multifidi L3-L5, bil gluteals, bil quads Electrical stimulation performed: No Parameters: N/A Treatment response/outcome: signif tension Rt lumbar at L4, signif twitch and release bil hips and quads STM Lt quad to address remaining soreness s/p DN  09/11/22 Nustep L1 5:30 while discussing status SLR x 5 bil Sit to stand from very high table 2 sets of 5 (no pain) Seated quad isometric 5x 10 sec hold Manual Therapy: to decrease muscle spasm and pain and improve mobility Skilled palpation and monitoring of soft tissues during DN STM to bil quads Trigger Point Dry-Needling  Treatment instructions: Expect mild to moderate muscle soreness. S/S of pneumothorax if dry needled over a lung field, and to seek immediate medical attention should they occur. Patient verbalized understanding of these instructions and education. Patient Consent Given: Yes Education handout provided: Previously provided Muscles treated: Bil  quads Electrical stimulation performed:yes Parameters: 1.5 ma 8 min Treatment response/outcome: Twitch Response Elicited and Palpable Increase in Muscle Length  09/10/22 - see objective Seated HS stretch 2x 30 sec  Seated fig 4 2x30 sec ea SLR x 10 bil Squat tap to mat table x 5 painful in med quads Manual Therapy: to decrease muscle spasm and pain and improve mobility Skilled palpation and monitoring of soft tissues during DN STM to bil piriformis and gluteals Trigger Point Dry-Needling  Treatment instructions: Expect mild to moderate muscle soreness. S/S of pneumothorax if dry needled over a lung field, and to seek immediate medical attention should they occur. Patient verbalized understanding of these instructions and education. Patient Consent Given: Yes Education handout provided: Previously provided Muscles treated: Bil piriformis and glute med/max Electrical stimulation performed: No Parameters: N/A Treatment response/outcome: Twitch Response Elicited and Palpable Increase in Muscle Length    09/03/22 See pt ed  PATIENT EDUCATION:  Education details: PT eval findings, anticipated POC, need for further assessment of gait, initial HEP, and role of DN  Person educated: Patient Education method: Explanation, Demonstration, and Handouts Education comprehension: verbalized understanding and returned demonstration  HOME EXERCISE PROGRAM: Access Code: [email or web address] URL: [email or web address] Date: 09/19/2022 Prepared by: Loistine Simas Keymon Mcelroy  Exercises - Seated Hamstring Stretch with Chair  - 2 x daily - 7 x weekly - 1 sets - 3 reps - 30-60 sec hold - Seated Piriformis Stretch  - 1 x daily - 7 x weekly - 1 sets - 3 reps - 30-60 sec hold - Supine Piriformis Stretch with Foot on Ground  - 2 x daily - 7 x weekly - 1 sets - 3 reps - 30 sec hold - Supine Straight Leg Raises  - 1 x daily - 7 x weekly - 1 sets - 10 reps - Sit to Stand with Arms Crossed  - 1 x daily - 7 x  weekly - 1 sets - 10 reps - Seated Isometric Knee Extension  - 1 x daily -  7 x weekly - 1 sets - 10 reps - Standing Quad Stretch with Table and Chair Support  - 1 x daily - 7 x weekly - 1 sets - 2 reps - 20 hold - Hip Flexor Stretch with Chair  - 1 x daily - 7 x weekly - 1 sets - 2 reps - 20 hold - Supine Bridge  - 1 x daily - 7 x weekly - 1 sets - 10 reps - Sidelying Thoracic Rotation with Open Book  - 1 x daily - 7 x weekly - 1 sets - 10 reps - Seated Flexion Stretch with Swiss Ball  - 1 x daily - 7 x weekly - 1 sets - 10 reps - 5 hold - Seated Thoracic Flexion and Rotation with Swiss Ball  - 1 x daily - 7 x weekly - 1 sets - 10 reps - 5 hold - Seated Trunk Rotation Stretch  - 1 x daily - 7 x weekly - 1 sets - 10 reps - 5 hold - Seated Alternating Side Stretch with Arm Overhead  - 1 x daily - 7 x weekly - 1 sets - 10 reps - 5 hold ASSESSMENT:  CLINICAL IMPRESSION: Pt was still sore in bil quads from DN earlier this week. He continues to be very limited in mobility of trunk and bil hips for ROM and flexibility.  Knee pain continues in anterior and superior aspect of knees bil.  PT progressed ROM, stretching and strength today with good tolerance.  HEP progressed to reflect select therex from today.  OBJECTIVE IMPAIRMENTS: decreased activity tolerance, difficulty walking, decreased strength, increased muscle spasms, impaired flexibility, postural dysfunction, and pain.   ACTIVITY LIMITATIONS: sitting, squatting, stairs, transfers, and locomotion level  PARTICIP/ATION LIMITATIONS: shopping and community activity  PERSONAL FACTORS: Age, Time since onset of injury/illness/exacerbation, and 3+ comorbidities: Neuropathy, COPD, HTN, anxiety/depression, lung cancer  are also affecting patient's functional outcome.   REHAB POTENTIAL: Good  CLINICAL DECISION MAKING: Stable/uncomplicated  EVALUATION COMPLEXITY: Low   SHORT TERM GOALS: Target date: 09/17/2022    Ind with initial  HEP Baseline: Goal status: met 5/9  2.  3 Min walk test to be completed by 2nd visit Baseline:  Goal status: MET   LONG TERM GOALS: Target date: 10/15/2022   Ind with advanced HEP and its progression Baseline:  Goal status: INITIAL  2. Decreased pain in the B knees by >=50% with stairs and STS transfers to improve QOL. Baseline:  Goal status: INITIAL  3.  Decreased pain in B hips by >=50% with amb to improve access to community Baseline:  Goal status: INITIAL  4. Improved FOTO to 47 showing functional improvement Baseline: 37 Goal status: INITIAL        PLAN:  PT FREQUENCY: 2x/week  PT DURATION: 6 weeks  PLANNED INTERVENTIONS: Therapeutic exercises, Therapeutic activity, Neuromuscular re-education, Balance training, Gait training, Patient/Family education, Self Care, Joint mobilization, Stair training, Aquatic Therapy, Dry Needling, Electrical stimulation, Cryotherapy, Moist heat, Taping, Ionotophoresis 4mg /ml Dexamethasone, and Manual therapy  PLAN FOR NEXT SESSION:  review HEP, add deadlifts to HEP, strength of bil hips and thighs, DN or STM to quads, gluteals, lumbar multifidi, core and functional strength  Journe Hallmark, PT 09/19/22 11:49 AM   Phone: [phone] Fax: [phone]

## 2022-09-20 ENCOUNTER — Inpatient Hospital Stay: Payer: Medicare PPO | Admitting: Oncology

## 2022-09-20 ENCOUNTER — Inpatient Hospital Stay: Payer: Medicare PPO | Attending: Oncology

## 2022-09-20 VITALS — BP 116/53 | HR 71 | Temp 98.2°F | Resp 18 | Ht 67.0 in | Wt 201.3 lb

## 2022-09-20 DIAGNOSIS — E538 Deficiency of other specified B group vitamins: Secondary | ICD-10-CM | POA: Insufficient documentation

## 2022-09-20 DIAGNOSIS — C186 Malignant neoplasm of descending colon: Secondary | ICD-10-CM | POA: Diagnosis not present

## 2022-09-20 DIAGNOSIS — G629 Polyneuropathy, unspecified: Secondary | ICD-10-CM | POA: Insufficient documentation

## 2022-09-20 DIAGNOSIS — C187 Malignant neoplasm of sigmoid colon: Secondary | ICD-10-CM | POA: Diagnosis not present

## 2022-09-20 DIAGNOSIS — I10 Essential (primary) hypertension: Secondary | ICD-10-CM | POA: Diagnosis not present

## 2022-09-20 DIAGNOSIS — R911 Solitary pulmonary nodule: Secondary | ICD-10-CM | POA: Insufficient documentation

## 2022-09-20 DIAGNOSIS — J449 Chronic obstructive pulmonary disease, unspecified: Secondary | ICD-10-CM | POA: Diagnosis not present

## 2022-09-20 LAB — CEA (ACCESS): CEA (CHCC): 1.76 ng/mL (ref 0.00–5.00)

## 2022-09-20 NOTE — Progress Notes (Signed)
[facility] OFFICE PROGRESS NOTE   Diagnosis: Colon cancer  INTERVAL HISTORY:   Glen [name] returns as scheduled.  He reports improvement in breathing with a new inhaler.  He is participating in physical therapy for knee pain.  He reports constipation since beginning iron several years ago.  No bleeding other than from a "hemorrhoid ".  Objective:  Vital signs in last 24 hours:  Blood pressure (!) 116/53, pulse 71, temperature 98.2 F (36.8 C), temperature source Oral, resp. rate 18, height 5\' 7"  (1.702 m), weight 201 lb 4.8 oz (91.3 kg), SpO2 92 %.    Lymphatics: No cervical, supraclavicular, axillary, or inguinal nodes Resp: Coarse and inspiratory rhonchi at the posterior base bilaterally, no respiratory distress Cardio: Regular rate and rhythm GI: No hepatosplenomegaly, nontender, no mass, ventral hernia Vascular: No leg edema  Lab Results:  Lab Results  Component Value Date   WBC 5.4 07/25/2018   HGB 8.9 (L) 07/25/2018   HCT 27.7 (L) 07/25/2018   MCV 93.3 07/25/2018   PLT 245 07/25/2018   NEUTROABS 5.3 07/17/2018    CMP  Lab Results  Component Value Date   NA 137 09/11/2021   K 4.5 09/11/2021   CL 105 09/11/2021   CO2 26 09/11/2021   GLUCOSE 121 (H) 09/11/2021   BUN 21 09/11/2021   CREATININE 1.10 12/07/2021   CALCIUM 9.5 09/11/2021   PROT 6.0 (L) 07/17/2018   ALBUMIN 3.4 (L) 07/17/2018   AST 19 07/17/2018   ALT 14 07/17/2018   ALKPHOS 85 07/17/2018   BILITOT 0.6 07/17/2018   GFRNONAA >60 09/11/2021   GFRAA >60 07/25/2018    Lab Results  Component Value Date   CEA1 1.87 09/14/2020   CEA 1.76 09/20/2022      Medications: I have reviewed the patient's current medications.   Assessment/Plan: Colon cancer  Colonoscopy by Dr. Randa Evens 06/22/2018-polypoid completely obstructing large mass was found in the proximal sigmoid colon.  Pathology showed fragments of ulcer and inflammatory debris; no viable tissue present.   CT abdomen/pelvis  06/30/2018-severe descending and sigmoid diverticulosis.  There was a focal dilatation of the mid descending colon to approximately 8.5 x 6.2 cm.  There was no overt exophytic mass or mesenteric lymphadenopathy.  No evidence of abdominal metastatic disease.   07/21/2018 open left colectomy with primary anastomosis, primary repair of umbilical hernia by Dr. Derrell Lolling.  Pathology showed invasive moderately differentiated adenocarcinoma with mucinous features measuring 7.5 cm.  There was circumferential involvement of the descending colon with luminal obstruction.  Carcinoma focally invaded into the pericolonic soft tissue.  Margins negative.  No lymphovascular or perineural invasion.  21 lymph nodes negative for cancer; loss of expression MLH1 and PMS2; MSI high.  MLH1 hyper methylation present Colonoscopy 05/27/2019-polyps removed from the sigmoid, descending, cecum, ascending, and transverse colon, nodular mucosa at the colonic anastomosis biopsied-benign ulcerated mucosa, polyps were tubular adenomas, mucosal prolapse polyps,  and a hyperplastic polyp Upper endoscopy 06/22/2018-esophageal mucosal changes suggestive of long segment Barrett's esophagus.  A few gastric polyps.  Normal examined duodenum.  GERD.  Stomach biopsy-fundic gland polyp.  Negative for dysplasia.  Esophagus biopsy-Barrett's mucosa.  Negative for dysplasia.   COPD Hypertension B12 deficiency Peripheral neuropathy Right lung nodule CT chest 07/31/2021-increased size of a right upper lobe nodule compared to December 2022 PET 08/29/2021-hypermetabolic right upper lobe nodule, no adenopathy or evidence of distant metastatic disease Bronchoscopy biopsy of right upper lobe nodule 09/11/2021-brushing with atypical large cells suspicious for tumor SBRT to right  upper lung lesion 10/10/2021 - 10/17/2018 23-54 Gray in 3 fractions CT chest 11/29/2021-decrease size of right upper lobe nodule, no new nodules CT chest 03/14/2022-development of groundglass and  consolidative airspace opacity in the right upper lobe about a nodule with adjacent biopsy clip, nodule unchanged in size, new subpleural nodule in the medial right lower lobe-nonspecific     Disposition: Glen Thiry remains in clinical remission from colon cancer.  He is scheduled for a surveillance chest CT next week.  He will return for an office visit and CEA in 6 months.  He underwent a colonoscopy in January 2021.  We will refer him to Dr. Ewing Schlein to consider the indication for a repeat colonoscopy.  I recommended he discuss the indication for continuing iron with Dr. Docia Chuck as repeat labs have been done in his office.  [name], MD  09/20/2022  10:44 AM

## 2022-09-23 ENCOUNTER — Ambulatory Visit (HOSPITAL_COMMUNITY)
Admission: RE | Admit: 2022-09-23 | Discharge: 2022-09-23 | Disposition: A | Discharge status: Home / Self Care | Payer: Medicare PPO | Source: Ambulatory Visit | Attending: Urology | Admitting: Urology

## 2022-09-23 DIAGNOSIS — C3411 Malignant neoplasm of upper lobe, right bronchus or lung: Secondary | ICD-10-CM | POA: Diagnosis not present

## 2022-09-23 DIAGNOSIS — J439 Emphysema, unspecified: Secondary | ICD-10-CM | POA: Diagnosis not present

## 2022-09-23 DIAGNOSIS — C3490 Malignant neoplasm of unspecified part of unspecified bronchus or lung: Secondary | ICD-10-CM | POA: Diagnosis not present

## 2022-09-23 MED ORDER — IOHEXOL 300 MG/ML  SOLN
60.0000 mL | Freq: Once | INTRAMUSCULAR | Status: AC | PRN
  Administered 2022-09-23: 60 mL via INTRAVENOUS

## 2022-09-23 MED ORDER — SODIUM CHLORIDE (PF) 0.9 % IJ SOLN
INTRAMUSCULAR | Status: AC
  Filled 2022-09-23: qty 50

## 2022-09-24 ENCOUNTER — Encounter: Payer: Self-pay | Admitting: Physical Therapy

## 2022-09-24 ENCOUNTER — Ambulatory Visit: Payer: Medicare PPO | Admitting: Physical Therapy

## 2022-09-24 DIAGNOSIS — G8929 Other chronic pain: Secondary | ICD-10-CM

## 2022-09-24 DIAGNOSIS — R252 Cramp and spasm: Secondary | ICD-10-CM | POA: Diagnosis not present

## 2022-09-24 DIAGNOSIS — M25551 Pain in right hip: Secondary | ICD-10-CM | POA: Diagnosis not present

## 2022-09-24 DIAGNOSIS — M25561 Pain in right knee: Secondary | ICD-10-CM | POA: Diagnosis not present

## 2022-09-24 DIAGNOSIS — M6281 Muscle weakness (generalized): Secondary | ICD-10-CM | POA: Diagnosis not present

## 2022-09-24 DIAGNOSIS — M25562 Pain in left knee: Secondary | ICD-10-CM | POA: Diagnosis not present

## 2022-09-24 DIAGNOSIS — M25552 Pain in left hip: Secondary | ICD-10-CM | POA: Diagnosis not present

## 2022-09-24 NOTE — Therapy (Signed)
OUTPATIENT PHYSICAL THERAPY TREATMENT   Patient Name: Glen [name] WJXBJYN MRN: [id number] DOB:1940/11/04, 82 y.o., male Today's Date: 09/24/2022  END OF SESSION:  PT End of Session - 09/24/22 0932     Visit Number 6    Number of Visits 12    Date for PT Re-Evaluation 10/15/22    Authorization Type [employer] MCR 12 visits to 6/14    Progress Note Due on Visit 10    PT Start Time 0933    PT Stop Time 1014    PT Time Calculation (min) 41 min    Activity Tolerance Patient tolerated treatment well    Behavior During Therapy [facility] for tasks assessed/performed                 Past Medical History:  Diagnosis Date   Allergic rhinitis, cause unspecified    Anxiety    Asthma    Cancer (HCC) 07/21/2018   colon   COPD (chronic obstructive pulmonary disease) (HCC)    Depression    Dyspnea    upon exertion   GERD (gastroesophageal reflux disease)    Hypertension    Pre-diabetes    PUD (peptic ulcer disease)    avoids aspirin   Sleep apnea    uses C-PAP   Past Surgical History:  Procedure Laterality Date   BRONCHIAL BIOPSY  09/11/2021   Procedure: BRONCHIAL BIOPSIES;  Surgeon: Josephine Igo, DO;  Location: [facility];  Service: Pulmonary;;   BRONCHIAL BRUSHINGS  09/11/2021   Procedure: BRONCHIAL BRUSHINGS;  Surgeon: Josephine Igo, DO;  Location: [facility];  Service: Pulmonary;;   CATARACT EXTRACTION, BILATERAL     COLONOSCOPY     FIDUCIAL MARKER PLACEMENT  09/11/2021   Procedure: FIDUCIAL MARKER PLACEMENT;  Surgeon: Josephine Igo, DO;  Location: [facility];  Service: Pulmonary;;   LAPAROSCOPIC LYSIS OF ADHESIONS N/A 07/21/2018   Procedure: Laparoscopic Lysis Of Adhesions;  Surgeon: Claud Kelp, MD;  Location: [facility];  Service: General;  Laterality: N/A;   LAPAROSCOPIC PARTIAL COLECTOMY Left 07/21/2018   Procedure: LAPARASCOPIC TAKEDOWN OF SPLENIC FLEXURE; OPEN LEFT COLETCTOMY WITH PRIMARY ANASTOMOSIS;  Surgeon: Claud Kelp, MD;  Location: [facility];  Service:  General;  Laterality: Left;   NASAL SEPTUM SURGERY  1960's   TONSILLECTOMY     UMBILICAL HERNIA REPAIR N/A 07/21/2018   Procedure: PRIMARY REPAIR OF UMBILICAL HERNIA , ADULT;  Surgeon: Claud Kelp, MD;  Location: [facility];  Service: General;  Laterality: N/A;   VIDEO BRONCHOSCOPY WITH RADIAL ENDOBRONCHIAL ULTRASOUND  09/11/2021   Procedure: RADIAL ENDOBRONCHIAL ULTRASOUND;  Surgeon: Josephine Igo, DO;  Location: [facility];  Service: Pulmonary;;   Patient Active Problem List   Diagnosis Date Noted   Putative stage I cancer of right upper lobe of lung (HCC) 09/25/2021   Stenosis of right carotid artery 05/09/2021   Coronary artery disease involving native coronary artery of native heart without angina pectoris 02/07/2021   Mixed hyperlipidemia 02/07/2021   Colonic mass 07/21/2018   Obstructive sleep apnea 12/06/2016   Lung nodules 06/11/2015   Tobacco use disorder, mild, in sustained remission 01/01/2010   URTICARIA 01/01/2010   DYSPNEA 07/13/2009   P U D 06/15/2009   Essential hypertension 06/08/2009   ALLERGIC RHINITIS 06/08/2009   COPD mixed type (HCC) 06/08/2009    PCP: Darrow Bussing, MD   REFERRING PROVIDER: Samson Frederic, MD   REFERRING DIAG:  M17.11 (ICD-10-CM) - Unilateral primary osteoarthritis, right knee  M25.561,M25.562 (ICD-10-CM) - Bilateral knee pain  THERAPY DIAG:  Chronic pain of both knees  Pain of both hip joints  Muscle weakness (generalized)  Cramp and spasm  Rationale for Evaluation and Treatment: Rehabilitation  ONSET DATE: years  SUBJECTIVE:   SUBJECTIVE STATEMENT: I am improving but continue to have pain in the top of both knees both at rest and with activity.   PERTINENT HISTORY: Neuropathy, COPD, HTN, anxiety/depression, lung cancer  PAIN: knees 6-7/10 Are you having pain? Yes: NPRS scale: 0 at rest 10 with sit to stand and steps /10 Pain location: Bil Knees (distal quads over patellas) Pain description: not ache or throb,  it just hurts Aggravating factors: sitting to standing, stairs Relieving factors: rest  Are you having pain? Yes: NPRS scale: 0 at rest 8 with walking /10 Pain location: Bil hips  Pain description: ache Aggravating factors: prolonged walking Relieving factors: rest  PRECAUTIONS: Other: COPD/lung CA  WEIGHT BEARING RESTRICTIONS: No  FALLS:  Has patient fallen in last 6 months? Yes. Number of falls 1 desk chair rolled out from under him  LIVING ENVIRONMENT Lives with: lives with their spouse Lives in: House/apartment Stairs: Yes: Internal: 14 steps; on right going up and External: 5-6 steps; bilateral but cannot reach both Has following equipment at home: None  OCCUPATION: retired  PLOF: Independent  PATIENT GOALS: not to hurt  NEXT MD VISIT: not scheduled  OBJECTIVE:   DIAGNOSTIC FINDINGS:  OA right knee  PATIENT SURVEYS:  FOTO 37 (predicted 34)  COGNITION: Overall cognitive status: Within functional limits for tasks assessed     SENSATION: Numb halfway up shins B and toes are numb  MUSCLE LENGTH: HS: marked bil Quads: mild tightness bil ITB: neg ober Piriformis: R >L Hip Flexors: Heelcords: WNL   POSTURE: rounded shoulders, forward head, and tight left QL (plays banjo)  PALPATION: Palpation: TTP at B glut min/med and piriformis. Also bil ITBs.  Patellar Mobility:WNL     LOWER EXTREMITY ROM: WNL   LOWER EXTREMITY MMT: 5/5 in B LE except R hip flex 4+/5    LOWER EXTREMITY SPECIAL TESTS:  Hip special tests: Hip scouring test: negative  FUNCTIONAL TESTS:  5 times sit to stand: 10 sec but painful 3 minute walk test: 566 ft reports pain in bil hips at 2 min (400 ft)  09/10/22   TODAY'S TREATMENT:                                                                                                                              DATE:  09/24/22: NuStep L6/L5 x 5' PT present to discuss status and plan for session Seated lumbar hang flexion stretch  holding 10lb dumbbell 5x10" Seated HS stretch 2x20" bil Standing quad stretch foot on chair 2x20" bil Seated ball rollouts 3-way 3 rounds Quad stretch bil standing foot on chair bil UE rail 2x20" Supine LTR 3x10" Supine bent knee ITB stretch with leg crossed pulling hip into IR 1x20" STM bil quads and ITB,  distal quad tendon superior to patella Inferior mobs to patella bil Gr III/IV   09/19/22: NuStep L6 x 5' PT present to discuss status and plan for session Seated ball rollouts trunk flexion x10, flexion with SB x 5 each Seated trunk rotation 2x10" bil Seated trunk SB with overhead reach 2x10 bil Open book x10 each way Supine HS stretch and ITB stretch with strap 2x20" bil Supine bridge 2x10 Supine SLR x10 bil Hamstring curls feet on red ball roll in/out Deadlift 10lb x15 Standing T at counter 1x5 bil Sidestepping along counter yellow loop at ankles x3 passes Quad stretch bil standing foot on chair bil UE rail 2x20" Hip flexor stretch foot on 2nd step 2x20" bil Wall sit 1/4 depth 2x20"  09/17/22: Gait down long hallway and back Trigger Point Dry-Needling  Treatment instructions: Expect mild to moderate muscle soreness. S/S of pneumothorax if dry needled over a lung field, and to seek immediate medical attention should they occur. Patient verbalized understanding of these instructions and education.  Patient Consent Given: Yes Education handout provided: Previously provided Muscles treated: bil lumbar multifidi L3-L5, bil gluteals, bil quads Electrical stimulation performed: No Parameters: N/A Treatment response/outcome: signif tension Rt lumbar at L4, signif twitch and release bil hips and quads STM Lt quad to address remaining soreness s/p DN   PATIENT EDUCATION:  Education details: PT eval findings, anticipated POC, need for further assessment of gait, initial HEP, and role of DN  Person educated: Patient Education method: Explanation, Demonstration, and  Handouts Education comprehension: verbalized understanding and returned demonstration  HOME EXERCISE PROGRAM: Access Code: [email or web address] URL: [email or web address] Date: 09/19/2022 Prepared by: Loistine Simas Thatcher Doberstein  Exercises - Seated Hamstring Stretch with Chair  - 2 x daily - 7 x weekly - 1 sets - 3 reps - 30-60 sec hold - Seated Piriformis Stretch  - 1 x daily - 7 x weekly - 1 sets - 3 reps - 30-60 sec hold - Supine Piriformis Stretch with Foot on Ground  - 2 x daily - 7 x weekly - 1 sets - 3 reps - 30 sec hold - Supine Straight Leg Raises  - 1 x daily - 7 x weekly - 1 sets - 10 reps - Sit to Stand with Arms Crossed  - 1 x daily - 7 x weekly - 1 sets - 10 reps - Seated Isometric Knee Extension  - 1 x daily - 7 x weekly - 1 sets - 10 reps - Standing Quad Stretch with Table and Chair Support  - 1 x daily - 7 x weekly - 1 sets - 2 reps - 20 hold - Hip Flexor Stretch with Chair  - 1 x daily - 7 x weekly - 1 sets - 2 reps - 20 hold - Supine Bridge  - 1 x daily - 7 x weekly - 1 sets - 10 reps - Sidelying Thoracic Rotation with Open Book  - 1 x daily - 7 x weekly - 1 sets - 10 reps - Seated Flexion Stretch with Swiss Ball  - 1 x daily - 7 x weekly - 1 sets - 10 reps - 5 hold - Seated Thoracic Flexion and Rotation with Swiss Ball  - 1 x daily - 7 x weekly - 1 sets - 10 reps - 5 hold - Seated Trunk Rotation Stretch  - 1 x daily - 7 x weekly - 1 sets - 10 reps - 5 hold - Seated Alternating Side Stretch with Arm Overhead  - 1 x daily -  7 x weekly - 1 sets - 10 reps - 5 hold ASSESSMENT:  CLINICAL IMPRESSION: Pt making steady improvement in bil knee pain.  He continues to be tight and tender along lateral quad and ITB bil.  Inferior mobs to bil patellae are restricted.  Less TP present bil LE.  STM and joint mobs used today and will target DN to remaining TP next visit.  Pt continues to benefit from stretching lumbar spine and hips for improved global mobility.  OBJECTIVE IMPAIRMENTS:  decreased activity tolerance, difficulty walking, decreased strength, increased muscle spasms, impaired flexibility, postural dysfunction, and pain.   ACTIVITY LIMITATIONS: sitting, squatting, stairs, transfers, and locomotion level  PARTICIP/ATION LIMITATIONS: shopping and community activity  PERSONAL FACTORS: Age, Time since onset of injury/illness/exacerbation, and 3+ comorbidities: Neuropathy, COPD, HTN, anxiety/depression, lung cancer  are also affecting patient's functional outcome.   REHAB POTENTIAL: Good  CLINICAL DECISION MAKING: Stable/uncomplicated  EVALUATION COMPLEXITY: Low   SHORT TERM GOALS: Target date: 09/17/2022    Ind with initial HEP Baseline: Goal status: met 5/9  2.  3 Min walk test to be completed by 2nd visit Baseline:  Goal status: MET   LONG TERM GOALS: Target date: 10/15/2022   Ind with advanced HEP and its progression Baseline:  Goal status: INITIAL  2. Decreased pain in the B knees by >=50% with stairs and STS transfers to improve QOL. Baseline:  Goal status: INITIAL  3.  Decreased pain in B hips by >=50% with amb to improve access to community Baseline:  Goal status: INITIAL  4. Improved FOTO to 47 showing functional improvement Baseline: 37 Goal status: INITIAL        PLAN:  PT FREQUENCY: 2x/week  PT DURATION: 6 weeks  PLANNED INTERVENTIONS: Therapeutic exercises, Therapeutic activity, Neuromuscular re-education, Balance training, Gait training, Patient/Family education, Self Care, Joint mobilization, Stair training, Aquatic Therapy, Dry Needling, Electrical stimulation, Cryotherapy, Moist heat, Taping, Ionotophoresis 4mg /ml Dexamethasone, and Manual therapy  PLAN FOR NEXT SESSION:  review HEP, add deadlifts to HEP, strength of bil hips and thighs, DN or STM to quads, gluteals, lumbar multifidi, core and functional strength  Glen [name], PT 09/24/22 10:16 AM    Phone: [phone] Fax: [phone]

## 2022-09-24 NOTE — Progress Notes (Signed)
Radiation Oncology         [phone] ________________________________  Name: Glen [name] MRN: [id number]  Date: 09/25/2022  DOB: [birth year]  Post Treatment Note  CC: Darrow Bussing, MD  Darrow Bussing, MD  Diagnosis:    82 year old male putative, Stage IA, NSCLC in the RUL lung  Interval Since Last Radiation:  11 months  10/10/21 - 10/16/21: SBRT//  The target in the RUL lung was treated to 54 Gy in 3 fractions of 18 Gy  Narrative:  I spoke with the patient to conduct his routine scheduled follow up visit via telephone to review his recent post-treatment CT Chest results to spare the patient unnecessary potential exposure in the healthcare setting during the current COVID-19 pandemic.  The patient was notified in advance and gave permission to proceed with this visit format.  He tolerated radiation treatment relatively well without any ill side effects aside from mild fatigue and mild dysphagia.                              On review of systems, the patient states that he is doing well in general.  He reports that the dysphagia has resolved completely and his energy level is gradually improving.  He has persistent, progressive shortness of breath and a non-productive cough (clear/white sputum in am) but denies chest pain, fever, chills or night sweats. He has continued using his inhalers for COPD which do not seem to significantly improve his breathing but he feels well otherwise.  His post-treatment Chest CT from 03/14/22 showed findings consistent with developing radiation pneumonitis and fibrosis but the treated nodule in the RUL appeared stable.  There was a new subpleural nodule of the medial right lower lobe measuring 1.4 x 0.8 cm that was nonspecific and felt most likely infectious or inflammatory, particularly in light of the rapid development as well as additional new more clearly infectious/inflammatory nodularity seen in the left lung base.  Therefore, we tried a course of  antibiotics and prednisone which he completed as prescribed and has not noticed significant improvement in his breathing although the productive cough has resolved. We obtained a short interval follow up with repeat CT Chest on 05/28/22 and this showed the post radiation fibrosis in the right upper lobe was similar to that seen on the prior scan although it had become somewhat more confluent inferiorly with more prominent volume loss. The 9 x 4 mm right upper lobe pulmonary nodule measured previously is no longer discretely evident in the background scarring.  There has been interval resolution of the right lower lobe subpleural nodule and clustered tree-in-bud nodularity seen previously in the left lower lobe with no new suspicious pulmonary nodule or mass.  He had a follow up CT Chest recently on 09/23/22 that showed a stable appearance without evidence of local recurrence or metastatic disease and stable post radiation changes in the right upper lobe lung.  We reviewed these results today by telephone.    ALLERGIES:  is allergic to aspirin, lisinopril, nsaids, and other.  Meds: Current Outpatient Medications  Medication Sig Dispense Refill   acetaminophen (TYLENOL) 650 MG CR tablet Take 1,950 mg by mouth at bedtime.     albuterol (PROAIR HFA) 108 (90 Base) MCG/ACT inhaler Inhale 2 puffs every 6 hours as needed (Patient taking differently: Inhale 2 puffs into the lungs every 6 (six) hours as needed for wheezing or shortness of breath. Inhale 2 puffs every  6 hours as needed) 8.5 g 12   ALPRAZolam (XANAX) 0.25 MG tablet Take 0.25 mg by mouth daily as needed for anxiety.      amLODipine (NORVASC) 10 MG tablet Take 10 mg by mouth daily.     aspirin EC 81 MG tablet 1 tablet     atorvastatin (LIPITOR) 40 MG tablet Take 40 mg by mouth at bedtime.      Budeson-Glycopyrrol-Formoterol (BREZTRI AEROSPHERE) 160-9-4.8 MCG/ACT AERO Inhale 2 puffs into the lungs in the morning and at bedtime. 2 each 3    Cyanocobalamin (VITAMIN B12) 1000 MCG TBCR 1 tablet     diphenhydrAMINE (BENADRYL) 25 MG tablet Take 50 mg by mouth at bedtime.     ezetimibe (ZETIA) 10 MG tablet TAKE 1 TABLET(10 MG) BY MOUTH DAILY 90 tablet 2   ferrous sulfate 325 (65 FE) MG tablet Take 325 mg by mouth daily.     fluticasone (FLONASE) 50 MCG/ACT nasal spray Place 2 sprays into both nostrils daily as needed for allergies.     gabapentin (NEURONTIN) 100 MG capsule TAKE 1 CAPSULE(100 MG) BY MOUTH EVERY MORNING 90 capsule 1   gabapentin (NEURONTIN) 300 MG capsule TAKE 2 CAPSULES(600 MG) BY MOUTH AT BEDTIME 180 capsule 0   hydrochlorothiazide (MICROZIDE) 12.5 MG capsule Take 12.5 mg by mouth daily.      levalbuterol (XOPENEX) 0.63 MG/3ML nebulizer solution Use 1 vial via nebulizer every 4 hours as needed for wheezing or shortness of breath. 425 mL 3   omeprazole (PRILOSEC) 20 MG capsule Take 20 mg by mouth daily.     Polyethylene Glycol 3350 (MIRALAX PO) Take 17 g by mouth daily.     primidone (MYSOLINE) 50 MG tablet 2 in the AM, 1 in the PM 270 tablet 3   sertraline (ZOLOFT) 100 MG tablet Take 100 mg by mouth daily.   4   telmisartan (MICARDIS) 40 MG tablet Take 40 mg by mouth daily.     No current facility-administered medications for this visit.    Physical Findings:  vitals were not taken for this visit.   /10 Unable to assess due to telephone follow-up visit format.  Lab Findings: Lab Results  Component Value Date   WBC 5.4 07/25/2018   HGB 8.9 (L) 07/25/2018   HCT 27.7 (L) 07/25/2018   MCV 93.3 07/25/2018   PLT 245 07/25/2018     Radiographic Findings: CT Chest W Contrast  Result Date: 09/24/2022 CLINICAL DATA:  Non-small cell lung cancer. Assess treatment response. Radiation therapy completed. * Tracking Code: BO * EXAM: CT CHEST WITH CONTRAST TECHNIQUE: Multidetector CT imaging of the chest was performed during intravenous contrast administration. RADIATION DOSE REDUCTION: This exam was performed according  to the departmental dose-optimization program which includes automated exposure control, adjustment of the mA and/or kV according to patient size and/or use of iterative reconstruction technique. CONTRAST:  60mL OMNIPAQUE IOHEXOL 300 MG/ML  SOLN COMPARISON:  Chest CT 05/28/2022 and 03/14/2022. FINDINGS: Cardiovascular: No acute vascular findings are identified. There is diffuse atherosclerosis of the aorta great vessels and coronary arteries. The heart size is normal. There is no pericardial effusion. Mediastinum/Nodes: There are no enlarged mediastinal, hilar or axillary lymph nodes. The thyroid gland, trachea and esophagus demonstrate no significant findings. Lungs/Pleura: No pleural effusion or pneumothorax. Centrilobular emphysema again noted. Chronic bandlike scarring inferiorly in the right upper lobe from previous radiation therapy is unchanged, best assessed on the reformatted images. There is a stable 3 mm right upper lobe nodule on image  18/5, unchanged from 04/14/2020, consistent with a benign finding. No new or enlarging pulmonary nodules are identified. Upper abdomen: The visualized upper abdomen appears stable, without significant findings. Musculoskeletal/Chest wall: There is no chest wall mass or suspicious osseous finding. Chronic findings of diffuse idiopathic skeletal hyperostosis. IMPRESSION: 1. Stable chest CT without evidence of local recurrence or metastatic disease. 2. Stable post radiation changes in the right upper lobe. 3. Aortic Atherosclerosis (ICD10-I70.0) and Emphysema (ICD10-J43.9). Electronically Signed   By: Carey Bullocks M.D.   On: 09/24/2022 09:45    Impression/Plan: 70.  82 year old male putative, Stage IA, NSCLC in the RUL lung. He appears to be recovering from the effects of his recent stereotactic body radiotherapy (SBRT) and has noticed some improvement in his breathing with the use of his new inhalers.  His recent follow up CT Chest shows a stable appearance without  evidence of local recurrence or disease progression.  Therefore, we will proceed with serial CT Chest scans every 6 months going forward, to continue to monitor for any evidence of disease recurrence or progression and will follow-up by phone to review the results and any recommendations following each scan.  Once we reach 5 years without evidence of disease recurrence, we can then moved to annual chest CT scans for surveillance.  He appears to have a good understanding of these recommendations and is comfortable and in agreement with the stated plan.  He knows that he is welcome to call anytime in the interim with any questions or concerns related to his radiation.   I personally spent 20 minutes in this encounter including chart review, reviewing radiological studies, meeting face-to-face with the patient, entering orders and completing documentation.     Marguarite Arbour, PA-C

## 2022-09-25 ENCOUNTER — Ambulatory Visit
Admission: RE | Admit: 2022-09-25 | Discharge: 2022-09-25 | Disposition: A | Discharge status: Home / Self Care | Payer: Medicare PPO | Source: Ambulatory Visit | Attending: Urology | Admitting: Urology

## 2022-09-25 DIAGNOSIS — Z87891 Personal history of nicotine dependence: Secondary | ICD-10-CM | POA: Diagnosis not present

## 2022-09-25 DIAGNOSIS — C3411 Malignant neoplasm of upper lobe, right bronchus or lung: Secondary | ICD-10-CM | POA: Diagnosis not present

## 2022-09-25 NOTE — Progress Notes (Signed)
Telephone nursing appointment for Stage IA, NSCLC in the RUL lung. I verified patient's identity and began nursing interview. Patient reports mildly improving SOB, well managed w/ inhalers and a nebulizer. No other issues conveyed at this time.   Meaningful use complete.   Patient aware of their 11:00am-09/25/2022 telephone appointment w/ Ashlyn Bruning PA-C. I left my extension [phone] in case patient needs anything. Patient verbalized understanding. This concludes the nursing interview.   Patient contact [phone]     Ruel Favors, LPN

## 2022-09-26 ENCOUNTER — Encounter: Payer: Self-pay | Admitting: Physical Therapy

## 2022-09-26 ENCOUNTER — Ambulatory Visit: Payer: Medicare PPO | Admitting: Physical Therapy

## 2022-09-26 DIAGNOSIS — R252 Cramp and spasm: Secondary | ICD-10-CM | POA: Diagnosis not present

## 2022-09-26 DIAGNOSIS — G8929 Other chronic pain: Secondary | ICD-10-CM | POA: Diagnosis not present

## 2022-09-26 DIAGNOSIS — M6281 Muscle weakness (generalized): Secondary | ICD-10-CM | POA: Diagnosis not present

## 2022-09-26 DIAGNOSIS — M25551 Pain in right hip: Secondary | ICD-10-CM | POA: Diagnosis not present

## 2022-09-26 DIAGNOSIS — M25552 Pain in left hip: Secondary | ICD-10-CM | POA: Diagnosis not present

## 2022-09-26 DIAGNOSIS — M25561 Pain in right knee: Secondary | ICD-10-CM | POA: Diagnosis not present

## 2022-09-26 DIAGNOSIS — M25562 Pain in left knee: Secondary | ICD-10-CM | POA: Diagnosis not present

## 2022-09-26 NOTE — Therapy (Signed)
OUTPATIENT PHYSICAL THERAPY TREATMENT   Patient Name: Glen [name] UEAVWUJ MRN: [id number] DOB:1941/01/02, 82 y.o., male Today's Date: 09/26/2022  END OF SESSION:  PT End of Session - 09/26/22 1020     Visit Number 7    Number of Visits 12    Date for PT Re-Evaluation 10/15/22    Authorization Type [employer] MCR 12 visits to 6/14    Progress Note Due on Visit 10    PT Start Time 1016    PT Stop Time 1100    PT Time Calculation (min) 44 min    Activity Tolerance Patient tolerated treatment well    Behavior During Therapy [facility] for tasks assessed/performed                  Past Medical History:  Diagnosis Date   Allergic rhinitis, cause unspecified    Anxiety    Asthma    Cancer (HCC) 07/21/2018   colon   COPD (chronic obstructive pulmonary disease) (HCC)    Depression    Dyspnea    upon exertion   GERD (gastroesophageal reflux disease)    Hypertension    Pre-diabetes    PUD (peptic ulcer disease)    avoids aspirin   Sleep apnea    uses C-PAP   Past Surgical History:  Procedure Laterality Date   BRONCHIAL BIOPSY  09/11/2021   Procedure: BRONCHIAL BIOPSIES;  Surgeon: Josephine Igo, DO;  Location: [facility];  Service: Pulmonary;;   BRONCHIAL BRUSHINGS  09/11/2021   Procedure: BRONCHIAL BRUSHINGS;  Surgeon: Josephine Igo, DO;  Location: [facility];  Service: Pulmonary;;   CATARACT EXTRACTION, BILATERAL     COLONOSCOPY     FIDUCIAL MARKER PLACEMENT  09/11/2021   Procedure: FIDUCIAL MARKER PLACEMENT;  Surgeon: Josephine Igo, DO;  Location: [facility];  Service: Pulmonary;;   LAPAROSCOPIC LYSIS OF ADHESIONS N/A 07/21/2018   Procedure: Laparoscopic Lysis Of Adhesions;  Surgeon: Claud Kelp, MD;  Location: [facility];  Service: General;  Laterality: N/A;   LAPAROSCOPIC PARTIAL COLECTOMY Left 07/21/2018   Procedure: LAPARASCOPIC TAKEDOWN OF SPLENIC FLEXURE; OPEN LEFT COLETCTOMY WITH PRIMARY ANASTOMOSIS;  Surgeon: Claud Kelp, MD;  Location: [facility];  Service:  General;  Laterality: Left;   NASAL SEPTUM SURGERY  1960's   TONSILLECTOMY     UMBILICAL HERNIA REPAIR N/A 07/21/2018   Procedure: PRIMARY REPAIR OF UMBILICAL HERNIA , ADULT;  Surgeon: Claud Kelp, MD;  Location: [facility];  Service: General;  Laterality: N/A;   VIDEO BRONCHOSCOPY WITH RADIAL ENDOBRONCHIAL ULTRASOUND  09/11/2021   Procedure: RADIAL ENDOBRONCHIAL ULTRASOUND;  Surgeon: Josephine Igo, DO;  Location: [facility];  Service: Pulmonary;;   Patient Active Problem List   Diagnosis Date Noted   Putative stage I cancer of right upper lobe of lung (HCC) 09/25/2021   Stenosis of right carotid artery 05/09/2021   Coronary artery disease involving native coronary artery of native heart without angina pectoris 02/07/2021   Mixed hyperlipidemia 02/07/2021   Colonic mass 07/21/2018   Obstructive sleep apnea 12/06/2016   Lung nodules 06/11/2015   Tobacco use disorder, mild, in sustained remission 01/01/2010   URTICARIA 01/01/2010   DYSPNEA 07/13/2009   P U D 06/15/2009   Essential hypertension 06/08/2009   ALLERGIC RHINITIS 06/08/2009   COPD mixed type (HCC) 06/08/2009    PCP: Darrow Bussing, MD   REFERRING PROVIDER: Darrow Bussing, MD   REFERRING DIAG:  M17.11 (ICD-10-CM) - Unilateral primary osteoarthritis, right knee  M25.561,M25.562 (ICD-10-CM) - Bilateral knee pain  THERAPY DIAG:  Chronic pain of both knees  Pain of both hip joints  Muscle weakness (generalized)  Cramp and spasm  Rationale for Evaluation and Treatment: Rehabilitation  ONSET DATE: years  SUBJECTIVE:   SUBJECTIVE STATEMENT: I may be hitting a plateau in progress - some days are better than others.  I want to try the DN again today.   PERTINENT HISTORY: Neuropathy, COPD, HTN, anxiety/depression, lung cancer  PAIN: knees 6-7/10  Pain location: Bil hips, top of knees  Pain description: ache Aggravating factors: prolonged walking Relieving factors: rest  PRECAUTIONS: Other: COPD/lung  CA  WEIGHT BEARING RESTRICTIONS: No  FALLS:  Has patient fallen in last 6 months? Yes. Number of falls 1 desk chair rolled out from under him  LIVING ENVIRONMENT Lives with: lives with their spouse Lives in: House/apartment Stairs: Yes: Internal: 14 steps; on right going up and External: 5-6 steps; bilateral but cannot reach both Has following equipment at home: None  OCCUPATION: retired  PLOF: Independent  PATIENT GOALS: not to hurt  NEXT MD VISIT: not scheduled  OBJECTIVE:   DIAGNOSTIC FINDINGS:  OA right knee  PATIENT SURVEYS:  FOTO 37 (predicted 68)  COGNITION: Overall cognitive status: Within functional limits for tasks assessed     SENSATION: Numb halfway up shins B and toes are numb  MUSCLE LENGTH: HS: marked bil Quads: mild tightness bil ITB: neg ober Piriformis: R >L Hip Flexors: Heelcords: WNL   POSTURE: rounded shoulders, forward head, and tight left QL (plays banjo)  PALPATION: Palpation: TTP at B glut min/med and piriformis. Also bil ITBs.  Patellar Mobility:WNL     LOWER EXTREMITY ROM: WNL   LOWER EXTREMITY MMT: 5/5 in B LE except R hip flex 4+/5    LOWER EXTREMITY SPECIAL TESTS:  Hip special tests: Hip scouring test: negative  FUNCTIONAL TESTS:  5 times sit to stand: 10 sec but painful 3 minute walk test: 566 ft reports pain in bil hips at 2 min (400 ft)  09/10/22   TODAY'S TREATMENT:                                                                                                                              DATE:  09/26/22: Recumbent bike x3' L2 PT present to discuss status Seated ball rollouts 3-way x 8 rounds Seated 10lb lumbar hang stretch 2x20" passive stretching by PT: sidelying hip flexor, sidelying quad, supine SKTC, piriformis, IR/ER A/ROM, hamstring bil Supine windshield wipers x 20, then hold 2x10" each way Trigger Point Dry-Needling  Treatment instructions: Expect mild to moderate muscle soreness. S/S of pneumothorax  if dry needled over a lung field, and to seek immediate medical attention should they occur. Patient verbalized understanding of these instructions and education.  Patient Consent Given: Yes Education handout provided: Previously provided Muscles treated: bil multifidi, bil VL - Lt more reactive than Rt today Electrical stimulation performed: No Parameters: N/A Treatment response/outcome: deep ache, quads radiate to knee,  release of tension   09/24/22: NuStep L6/L5 x 5' PT present to discuss status and plan for session Seated lumbar hang flexion stretch holding 10lb dumbbell 5x10" Seated HS stretch 2x20" bil Standing quad stretch foot on chair 2x20" bil Seated ball rollouts 3-way 3 rounds Quad stretch bil standing foot on chair bil UE rail 2x20" Supine LTR 3x10" Supine bent knee ITB stretch with leg crossed pulling hip into IR 1x20" STM bil quads and ITB, distal quad tendon superior to patella Inferior mobs to patella bil Gr III/IV   09/19/22: NuStep L6 x 5' PT present to discuss status and plan for session Seated ball rollouts trunk flexion x10, flexion with SB x 5 each Seated trunk rotation 2x10" bil Seated trunk SB with overhead reach 2x10 bil Open book x10 each way Supine HS stretch and ITB stretch with strap 2x20" bil Supine bridge 2x10 Supine SLR x10 bil Hamstring curls feet on red ball roll in/out Deadlift 10lb x15 Standing T at counter 1x5 bil Sidestepping along counter yellow loop at ankles x3 passes Quad stretch bil standing foot on chair bil UE rail 2x20" Hip flexor stretch foot on 2nd step 2x20" bil Wall sit 1/4 depth 2x20"  09/17/22: Gait down long hallway and back Trigger Point Dry-Needling  Treatment instructions: Expect mild to moderate muscle soreness. S/S of pneumothorax if dry needled over a lung field, and to seek immediate medical attention should they occur. Patient verbalized understanding of these instructions and education.  Patient Consent Given:  Yes Education handout provided: Previously provided Muscles treated: bil lumbar multifidi L3-L5, bil gluteals, bil quads Electrical stimulation performed: No Parameters: N/A Treatment response/outcome: signif tension Rt lumbar at L4, signif twitch and release bil hips and quads STM Lt quad to address remaining soreness s/p DN   PATIENT EDUCATION:  Education details: PT eval findings, anticipated POC, need for further assessment of gait, initial HEP, and role of DN  Person educated: Patient Education method: Explanation, Demonstration, and Handouts Education comprehension: verbalized understanding and returned demonstration  HOME EXERCISE PROGRAM: Access Code: [email or web address] URL: [email or web address] Date: 09/19/2022 Prepared by: Loistine Simas Lonzy Mato  Exercises - Seated Hamstring Stretch with Chair  - 2 x daily - 7 x weekly - 1 sets - 3 reps - 30-60 sec hold - Seated Piriformis Stretch  - 1 x daily - 7 x weekly - 1 sets - 3 reps - 30-60 sec hold - Supine Piriformis Stretch with Foot on Ground  - 2 x daily - 7 x weekly - 1 sets - 3 reps - 30 sec hold - Supine Straight Leg Raises  - 1 x daily - 7 x weekly - 1 sets - 10 reps - Sit to Stand with Arms Crossed  - 1 x daily - 7 x weekly - 1 sets - 10 reps - Seated Isometric Knee Extension  - 1 x daily - 7 x weekly - 1 sets - 10 reps - Standing Quad Stretch with Table and Chair Support  - 1 x daily - 7 x weekly - 1 sets - 2 reps - 20 hold - Hip Flexor Stretch with Chair  - 1 x daily - 7 x weekly - 1 sets - 2 reps - 20 hold - Supine Bridge  - 1 x daily - 7 x weekly - 1 sets - 10 reps - Sidelying Thoracic Rotation with Open Book  - 1 x daily - 7 x weekly - 1 sets - 10 reps - Seated Flexion Stretch with [employer]  -  1 x daily - 7 x weekly - 1 sets - 10 reps - 5 hold - Seated Thoracic Flexion and Rotation with Swiss Ball  - 1 x daily - 7 x weekly - 1 sets - 10 reps - 5 hold - Seated Trunk Rotation Stretch  - 1 x daily - 7 x weekly - 1  sets - 10 reps - 5 hold - Seated Alternating Side Stretch with Arm Overhead  - 1 x daily - 7 x weekly - 1 sets - 10 reps - 5 hold ASSESSMENT:  CLINICAL IMPRESSION: Pt making steady improvement in bil knee pain.  He has global stiffness throughout spine, bil hips and thighs which all likely contribute to multiple joint pains.  He is limited in walking to 50-60 feet before he gets signif ache in bil hips.  PT performed end range passive stretching and ROM to LE today along with DN.  Pt felt sore end of session.  OBJECTIVE IMPAIRMENTS: decreased activity tolerance, difficulty walking, decreased strength, increased muscle spasms, impaired flexibility, postural dysfunction, and pain.   ACTIVITY LIMITATIONS: sitting, squatting, stairs, transfers, and locomotion level  PARTICIP/ATION LIMITATIONS: shopping and community activity  PERSONAL FACTORS: Age, Time since onset of injury/illness/exacerbation, and 3+ comorbidities: Neuropathy, COPD, HTN, anxiety/depression, lung cancer  are also affecting patient's functional outcome.   REHAB POTENTIAL: Good  CLINICAL DECISION MAKING: Stable/uncomplicated  EVALUATION COMPLEXITY: Low   SHORT TERM GOALS: Target date: 09/17/2022    Ind with initial HEP Baseline: Goal status: met 5/9  2.  3 Min walk test to be completed by 2nd visit Baseline:  Goal status: MET   LONG TERM GOALS: Target date: 10/15/2022   Ind with advanced HEP and its progression Baseline:  Goal status: INITIAL  2. Decreased pain in the B knees by >=50% with stairs and STS transfers to improve QOL. Baseline:  Goal status: INITIAL  3.  Decreased pain in B hips by >=50% with amb to improve access to community Baseline:  Goal status: INITIAL  4. Improved FOTO to 47 showing functional improvement Baseline: 37 Goal status: INITIAL        PLAN:  PT FREQUENCY: 2x/week  PT DURATION: 6 weeks  PLANNED INTERVENTIONS: Therapeutic exercises, Therapeutic activity, Neuromuscular  re-education, Balance training, Gait training, Patient/Family education, Self Care, Joint mobilization, Stair training, Aquatic Therapy, Dry Needling, Electrical stimulation, Cryotherapy, Moist heat, Taping, Ionotophoresis 4mg /ml Dexamethasone, and Manual therapy  PLAN FOR NEXT SESSION:  review HEP, add deadlifts to HEP, strength of bil hips and thighs, DN or STM to quads, gluteals, lumbar multifidi, core and functional strength  Aliani Caccavale, PT 09/26/22 11:51 AM     Phone: [phone] Fax: [phone]

## 2022-10-01 ENCOUNTER — Ambulatory Visit: Payer: Medicare PPO | Admitting: Physical Therapy

## 2022-10-01 ENCOUNTER — Encounter: Payer: Self-pay | Admitting: Physical Therapy

## 2022-10-01 DIAGNOSIS — M25552 Pain in left hip: Secondary | ICD-10-CM | POA: Diagnosis not present

## 2022-10-01 DIAGNOSIS — R252 Cramp and spasm: Secondary | ICD-10-CM

## 2022-10-01 DIAGNOSIS — M25562 Pain in left knee: Secondary | ICD-10-CM | POA: Diagnosis not present

## 2022-10-01 DIAGNOSIS — M6281 Muscle weakness (generalized): Secondary | ICD-10-CM | POA: Diagnosis not present

## 2022-10-01 DIAGNOSIS — G8929 Other chronic pain: Secondary | ICD-10-CM

## 2022-10-01 DIAGNOSIS — M25551 Pain in right hip: Secondary | ICD-10-CM | POA: Diagnosis not present

## 2022-10-01 DIAGNOSIS — M25561 Pain in right knee: Secondary | ICD-10-CM | POA: Diagnosis not present

## 2022-10-01 NOTE — Therapy (Signed)
OUTPATIENT PHYSICAL THERAPY TREATMENT   Patient Name: Glen [name] ZOXWRUE MRN: [id number] DOB:November 12, 1940, 82 y.o., male Today's Date: 10/01/2022  END OF SESSION:  PT End of Session - 10/01/22 0937     Visit Number 8    Number of Visits 12    Date for PT Re-Evaluation 10/15/22    Authorization Type [employer] MCR 12 visits to 6/14    Progress Note Due on Visit 10    PT Start Time 0932    PT Stop Time 1010    PT Time Calculation (min) 38 min    Activity Tolerance Patient tolerated treatment well    Behavior During Therapy [facility] for tasks assessed/performed                   Past Medical History:  Diagnosis Date   Allergic rhinitis, cause unspecified    Anxiety    Asthma    Cancer (HCC) 07/21/2018   colon   COPD (chronic obstructive pulmonary disease) (HCC)    Depression    Dyspnea    upon exertion   GERD (gastroesophageal reflux disease)    Hypertension    Pre-diabetes    PUD (peptic ulcer disease)    avoids aspirin   Sleep apnea    uses C-PAP   Past Surgical History:  Procedure Laterality Date   BRONCHIAL BIOPSY  09/11/2021   Procedure: BRONCHIAL BIOPSIES;  Surgeon: Josephine Igo, DO;  Location: [facility];  Service: Pulmonary;;   BRONCHIAL BRUSHINGS  09/11/2021   Procedure: BRONCHIAL BRUSHINGS;  Surgeon: Josephine Igo, DO;  Location: [facility];  Service: Pulmonary;;   CATARACT EXTRACTION, BILATERAL     COLONOSCOPY     FIDUCIAL MARKER PLACEMENT  09/11/2021   Procedure: FIDUCIAL MARKER PLACEMENT;  Surgeon: Josephine Igo, DO;  Location: [facility];  Service: Pulmonary;;   LAPAROSCOPIC LYSIS OF ADHESIONS N/A 07/21/2018   Procedure: Laparoscopic Lysis Of Adhesions;  Surgeon: Claud Kelp, MD;  Location: [facility];  Service: General;  Laterality: N/A;   LAPAROSCOPIC PARTIAL COLECTOMY Left 07/21/2018   Procedure: LAPARASCOPIC TAKEDOWN OF SPLENIC FLEXURE; OPEN LEFT COLETCTOMY WITH PRIMARY ANASTOMOSIS;  Surgeon: Claud Kelp, MD;  [facility]: [facility];  Service:  General;  Laterality: Left;   NASAL SEPTUM SURGERY  1960's   TONSILLECTOMY     UMBILICAL HERNIA REPAIR N/A 07/21/2018   Procedure: PRIMARY REPAIR OF UMBILICAL HERNIA , ADULT;  Surgeon: Claud Kelp, MD;  [facility]: [facility];  Service: General;  Laterality: N/A;   VIDEO BRONCHOSCOPY WITH RADIAL ENDOBRONCHIAL ULTRASOUND  09/11/2021   Procedure: RADIAL ENDOBRONCHIAL ULTRASOUND;  Surgeon: Josephine Igo, DO;  [facility]: [facility];  Service: Pulmonary;;   Patient Active Problem List   Diagnosis Date Noted   Putative stage I cancer of right upper lobe of lung (HCC) 09/25/2021   Stenosis of right carotid artery 05/09/2021   Coronary artery disease involving native coronary artery of native heart without angina pectoris 02/07/2021   Mixed hyperlipidemia 02/07/2021   Colonic mass 07/21/2018   Obstructive sleep apnea 12/06/2016   Lung nodules 06/11/2015   Tobacco use disorder, mild, in sustained remission 01/01/2010   URTICARIA 01/01/2010   DYSPNEA 07/13/2009   P U D 06/15/2009   Essential hypertension 06/08/2009   ALLERGIC RHINITIS 06/08/2009   COPD mixed type (HCC) 06/08/2009    PCP: Darrow Bussing, MD   REFERRING PROVIDER: Darrow Bussing, MD   REFERRING DIAG:  M17.11 (ICD-10-CM) - Unilateral primary osteoarthritis, right knee  M25.561,M25.562 (ICD-10-CM) - Bilateral knee pain  THERAPY DIAG:  Chronic pain of both knees  Pain of both hip joints  Muscle weakness (generalized)  Cramp and spasm  Rationale for Evaluation and Treatment: Rehabilitation  ONSET DATE: years  SUBJECTIVE:   SUBJECTIVE STATEMENT: I am not getting better.  I know we have tried everything.  My depression is kicking in too b/c I'm not improving.    PERTINENT HISTORY: Neuropathy, COPD, HTN, anxiety/depression, lung cancer  PAIN: knees 6-7/10  Pain location: Bil hips, top of knees  Pain description: ache Aggravating factors: prolonged walking Relieving factors: rest  PRECAUTIONS: Other:  COPD/lung CA  WEIGHT BEARING RESTRICTIONS: No  FALLS:  Has patient fallen in last 6 months? Yes. Number of falls 1 desk chair rolled out from under him  LIVING ENVIRONMENT Lives with: lives with their spouse Lives in: House/apartment Stairs: Yes: Internal: 14 steps; on right going up and External: 5-6 steps; bilateral but cannot reach both Has following equipment at home: None  OCCUPATION: retired  PLOF: Independent  PATIENT GOALS: not to hurt  NEXT MD VISIT: not scheduled  OBJECTIVE:   DIAGNOSTIC FINDINGS:  OA right knee  PATIENT SURVEYS:  FOTO 37 (predicted 29)  COGNITION: Overall cognitive status: Within functional limits for tasks assessed     SENSATION: Numb halfway up shins B and toes are numb  MUSCLE LENGTH: HS: marked bil Quads: mild tightness bil ITB: neg ober Piriformis: R >L Hip Flexors: Heelcords: WNL   POSTURE: rounded shoulders, forward head, and tight left QL (plays banjo)  PALPATION: Palpation: TTP at B glut min/med and piriformis. Also bil ITBs.  Patellar Mobility:WNL     LOWER EXTREMITY ROM: WNL   LOWER EXTREMITY MMT: 5/5 in B LE except R hip flex 4+/5    LOWER EXTREMITY SPECIAL TESTS:  Hip special tests: Hip scouring test: negative  FUNCTIONAL TESTS:  5 times sit to stand: 10 sec but painful 3 minute walk test: 566 ft reports pain in bil hips at 2 min (400 ft)  09/10/22   TODAY'S TREATMENT:                                                                                                                              DATE:  10/01/22: NuStep L1 x5 min PT present to discuss status IFC lumbar spine with cold pack, supine, concurrent with Addaday assisted massage to bil quads and ITB Pt educ on how to use his wife's portable TENS unit Encouraged Pt to contact ortho for next steps for knees   09/26/22: Recumbent bike x3' L2 PT present to discuss status Seated ball rollouts 3-way x 8 rounds Seated 10lb lumbar hang stretch  2x20" passive stretching by PT: sidelying hip flexor, sidelying quad, supine SKTC, piriformis, IR/ER A/ROM, hamstring bil Supine windshield wipers x 20, then hold 2x10" each way Trigger Point Dry-Needling  Treatment instructions: Expect mild to moderate muscle soreness. S/S of pneumothorax if dry needled over a lung field, and to seek immediate  medical attention should they occur. Patient verbalized understanding of these instructions and education.  Patient Consent Given: Yes Education handout provided: Previously provided Muscles treated: bil multifidi, bil VL - Lt more reactive than Rt today Electrical stimulation performed: No Parameters: N/A Treatment response/outcome: deep ache, quads radiate to knee, release of tension   09/24/22: NuStep L6/L5 x 5' PT present to discuss status and plan for session Seated lumbar hang flexion stretch holding 10lb dumbbell 5x10" Seated HS stretch 2x20" bil Standing quad stretch foot on chair 2x20" bil Seated ball rollouts 3-way 3 rounds Quad stretch bil standing foot on chair bil UE rail 2x20" Supine LTR 3x10" Supine bent knee ITB stretch with leg crossed pulling hip into IR 1x20" STM bil quads and ITB, distal quad tendon superior to patella   PATIENT EDUCATION:  Education details: PT eval findings, anticipated POC, need for further assessment of gait, initial HEP, and role of DN  Person educated: Patient Education method: Explanation, Demonstration, and Handouts Education comprehension: verbalized understanding and returned demonstration  HOME EXERCISE PROGRAM: Access Code: [email or web address] URL: [email or web address] Date: 09/19/2022 Prepared by: Loistine Simas Renate Danh  Exercises - Seated Hamstring Stretch with Chair  - 2 x daily - 7 x weekly - 1 sets - 3 reps - 30-60 sec hold - Seated Piriformis Stretch  - 1 x daily - 7 x weekly - 1 sets - 3 reps - 30-60 sec hold - Supine Piriformis Stretch with Foot on Ground  - 2 x daily - 7 x weekly  - 1 sets - 3 reps - 30 sec hold - Supine Straight Leg Raises  - 1 x daily - 7 x weekly - 1 sets - 10 reps - Sit to Stand with Arms Crossed  - 1 x daily - 7 x weekly - 1 sets - 10 reps - Seated Isometric Knee Extension  - 1 x daily - 7 x weekly - 1 sets - 10 reps - Standing Quad Stretch with Table and Chair Support  - 1 x daily - 7 x weekly - 1 sets - 2 reps - 20 hold - Hip Flexor Stretch with Chair  - 1 x daily - 7 x weekly - 1 sets - 2 reps - 20 hold - Supine Bridge  - 1 x daily - 7 x weekly - 1 sets - 10 reps - Sidelying Thoracic Rotation with Open Book  - 1 x daily - 7 x weekly - 1 sets - 10 reps - Seated Flexion Stretch with Swiss Ball  - 1 x daily - 7 x weekly - 1 sets - 10 reps - 5 hold - Seated Thoracic Flexion and Rotation with Swiss Ball  - 1 x daily - 7 x weekly - 1 sets - 10 reps - 5 hold - Seated Trunk Rotation Stretch  - 1 x daily - 7 x weekly - 1 sets - 10 reps - 5 hold - Seated Alternating Side Stretch with Arm Overhead  - 1 x daily - 7 x weekly - 1 sets - 10 reps - 5 hold ASSESSMENT:  CLINICAL IMPRESSION: Pt arrived discouraged and reporting that he is ready to d/c due to lack of progress.  He feels his depression has increased and he plans to contact his PCP regarding this.  He will also touch base with Dr. Linna Caprice who has previously done imaging and gel injections for his knee pain.  Pt continues to be very stiff throughout lumbar spine, bil hips and thighs.  We have performed DN, stretching,  strengthening, manual therapy and HEP development.  Today PT added modalities to lumbar spine alongside Addaday STM to bil quads as option for home.  We will d/c at this time due to lack of progress and Pt request.  OBJECTIVE IMPAIRMENTS: decreased activity tolerance, difficulty walking, decreased strength, increased muscle spasms, impaired flexibility, postural dysfunction, and pain.   ACTIVITY LIMITATIONS: sitting, squatting, stairs, transfers, and locomotion level  PARTICIP/ATION  LIMITATIONS: shopping and community activity  PERSONAL FACTORS: Age, Time since onset of injury/illness/exacerbation, and 3+ comorbidities: Neuropathy, COPD, HTN, anxiety/depression, lung cancer  are also affecting patient's functional outcome.   REHAB POTENTIAL: Good  CLINICAL DECISION MAKING: Stable/uncomplicated  EVALUATION COMPLEXITY: Low   SHORT TERM GOALS: Target date: 09/17/2022    Ind with initial HEP Baseline: Goal status: met 5/9  2.  3 Min walk test to be completed by 2nd visit Baseline:  Goal status: MET   LONG TERM GOALS: Target date: 10/15/2022   Ind with advanced HEP and its progression Baseline:  Goal status: met  2. Decreased pain in the B knees by >=50% with stairs and STS transfers to improve QOL. Baseline:  Goal status: not met  3.  Decreased pain in B hips by >=50% with amb to improve access to community Baseline:  Goal status: 30-50%, partially met  4. Improved FOTO to 47 showing functional improvement Baseline: 37 Goal status: not met        PLAN:  PT FREQUENCY: 2x/week  PT DURATION: 6 weeks  PLANNED INTERVENTIONS: Therapeutic exercises, Therapeutic activity, Neuromuscular re-education, Balance training, Gait training, Patient/Family education, Self Care, Joint mobilization, Stair training, Aquatic Therapy, Dry Needling, Electrical stimulation, Cryotherapy, Moist heat, Taping, Ionotophoresis 4mg /ml Dexamethasone, and Manual therapy  PLAN FOR NEXT SESSION:  d/c due to lack of progress, back to MD  PHYSICAL THERAPY DISCHARGE SUMMARY  Visits from Start of Care:     Current functional level related to goals / functional outcomes: Goals not met, ongoing pain, Pt request to d/c   Remaining deficits: See above   Education / Equipment: HEP, TENS for lumbar spine (has one at home)   Patient agrees to discharge. Patient goals were partially met. Patient is being discharged due to lack of progress.   Damien Cisar, PT 10/01/22 10:15  AM    Phone: [phone] Fax: [phone]

## 2022-10-03 ENCOUNTER — Ambulatory Visit: Payer: Medicare PPO | Admitting: Physical Therapy

## 2022-10-07 DIAGNOSIS — S40862A Insect bite (nonvenomous) of left upper arm, initial encounter: Secondary | ICD-10-CM | POA: Diagnosis not present

## 2022-10-08 ENCOUNTER — Encounter: Payer: Medicare PPO | Admitting: Physical Therapy

## 2022-10-11 DIAGNOSIS — M170 Bilateral primary osteoarthritis of knee: Secondary | ICD-10-CM | POA: Diagnosis not present

## 2022-11-01 ENCOUNTER — Other Ambulatory Visit: Payer: Self-pay | Admitting: Neurology

## 2022-11-01 DIAGNOSIS — G250 Essential tremor: Secondary | ICD-10-CM

## 2022-11-01 DIAGNOSIS — G609 Hereditary and idiopathic neuropathy, unspecified: Secondary | ICD-10-CM

## 2022-11-12 DIAGNOSIS — Z961 Presence of intraocular lens: Secondary | ICD-10-CM | POA: Diagnosis not present

## 2022-11-12 DIAGNOSIS — H52203 Unspecified astigmatism, bilateral: Secondary | ICD-10-CM | POA: Diagnosis not present

## 2022-12-03 ENCOUNTER — Ambulatory Visit: Payer: Medicare PPO | Admitting: Nurse Practitioner

## 2022-12-03 ENCOUNTER — Encounter: Payer: Self-pay | Admitting: Nurse Practitioner

## 2022-12-03 VITALS — BP 106/68 | HR 85 | Ht 67.0 in | Wt 202.2 lb

## 2022-12-03 DIAGNOSIS — C3411 Malignant neoplasm of upper lobe, right bronchus or lung: Secondary | ICD-10-CM

## 2022-12-03 DIAGNOSIS — G4733 Obstructive sleep apnea (adult) (pediatric): Secondary | ICD-10-CM

## 2022-12-03 DIAGNOSIS — J449 Chronic obstructive pulmonary disease, unspecified: Secondary | ICD-10-CM

## 2022-12-03 MED ORDER — ALBUTEROL SULFATE HFA 108 (90 BASE) MCG/ACT IN AERS: 2.0000 | INHALATION_SPRAY | Freq: Four times a day (QID) | RESPIRATORY_TRACT | 5 refills | Status: AC | PRN

## 2022-12-03 NOTE — Patient Instructions (Addendum)
Continue Breztri 2 puffs Twice daily. Brush tongue and rinse mouth afterwards Continue Albuterol inhaler 2 puffs or 3 mL neb every 6 hours as needed for shortness of breath or wheezing. Notify if symptoms persist despite rescue inhaler/neb use. Use your neb twice a day before your Breztri  Continue flonase nasal spray 2 sprays each nostril daily  Continue to use CPAP every night, minimum of 4-6 hours a night.  Be aware of reduced alertness and do not drive or operate heavy machinery if experiencing this or drowsiness.   Follow up with oncology as scheduled   Follow up in 4-6 months with Dr. Tonia Brooms or Florentina Addison [place],NP. If symptoms worsen, please contact office for sooner follow up or seek emergency care.

## 2022-12-03 NOTE — Assessment & Plan Note (Signed)
S/p SBRT. CT imaging from 09/2022 stable. Follow up with radiation oncology as scheduled.

## 2022-12-03 NOTE — Progress Notes (Signed)
@Patient  ID: Glen [name], male    DOB: October 21, 1940, 82 y.o.   MRN: [id number]  Chief Complaint  Patient presents with   Follow-up    Sob,not worse then baseline.  pt has been using inhaler, occ using neb     Referring provider: Darrow Bussing, MD  HPI: 82 year old male, former smoker followed for lung nodule, COPD mixed type, and OSA on CPAP. He is a patient of Dr. Roxy Cedar and also followed by Dr. Tonia Brooms for pulmonary nodule. Past medical history significant for HTN, CAD, allergic rhinitis, HLD.   TEST/EVENTS:  02/26/2013 PFT: FVC 70, FEV1 46, ratio 52, TLC 93, DLCOunc 87 corrects to normal for alveolar volume  09/23/2022 CT chest wo contrast: atherosclerosis. Centrilobular emphysema. Chronic bandlike scarring in the RUL from previous radiation therapy, unchanged. Stable 3 mm RUL nodule, unchanged and considered benign. Chronic idiopathic skeletal hyperostosis.   06/04/2022: OV with Dr. Tonia Brooms. CT imaging 07/2021 with abnormal RUL nodule. PET scan revealed hypermetabolic uptake within the RUL with no evidence of a distant metastatic disease. Biopsy was not definitive. He was referred for empiric SBRT. 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 Using inhaler regularly. Does have daily congestion. Not using nebs regularly but does feel like they help. Stopped Trelegy. Started on [place]. Recommended using nebs in AM and PM.   12/03/2022: Today - follow up Patient presents today for follow up. He was changed to [place] at his last visit. He does feel like this has helped opened up his chest more. He's not coughing as much and doesn't feel as congested. His breathing feels relatively the same. His only complaint is the fact he has to do it twice a day. He does use his nebs a few times a week but not daily. Not interested in pulmonary rehab right now. Just finished PT for his knees, which he felt helped. His recent CT chest was stable with post treatment changes; no recurrence of  disease. He denies any hemoptysis, weight loss, anorexia.   11/03/2022-12/02/2022: CPAP 5-20 cmH2O 30/30 days; 100% >4 hr; average use 9 hr 9 min Pressure 95th 12.4 Leaks 95th 26.6 AHI 1  Allergies  Allergen Reactions   Aspirin Other (See Comments)    pt has bleeding ulcers   Lisinopril Cough   Nsaids     Other reaction(s): GI upset   Other     GI Upset (intolerance)  AVOID NSAIDS    Immunization History  Administered Date(s) Administered   Influenza Split 05/10/2003, 04/17/2009, 03/15/2010, 01/21/2011, 02/11/2012, 05/09/2012, 04/27/2013, 02/04/2014, 02/25/2018   Influenza Whole 03/14/2011   Influenza, High Dose Seasonal PF 01/20/2019, 01/24/2020   Influenza,inj,Quad PF,6+ Mos 02/26/2013, 02/04/2014, 02/06/2015, 05/20/2016, 02/25/2018, 01/13/2019, 01/24/2020, 01/25/2021   Influenza-Unspecified 02/09/2022   PFIZER(Purple Top)SARS-COV-2 Vaccination 06/04/2019, 06/25/2019, 07/13/2019, 02/28/2020, 08/31/2020   Pfizer Covid-19 Vaccine Bivalent Booster 60yrs & up 05/18/2021, 06/13/2022   Pneumococcal Conjugate-13 12/21/2013   Pneumococcal Polysaccharide-23 05/22/2007   Td 11/26/2000   Tdap 11/09/2010, 01/25/2021   Zoster, Live 12/18/2016, 02/27/2017    Past Medical History:  Diagnosis Date   Allergic rhinitis, cause unspecified    Anxiety    Asthma    Cancer (HCC) 07/21/2018   colon   COPD (chronic obstructive pulmonary disease) (HCC)    Depression    Dyspnea    upon exertion   GERD (gastroesophageal reflux disease)    Hypertension    Pre-diabetes    PUD (peptic ulcer disease)    avoids aspirin   Sleep apnea  uses C-PAP    Tobacco History: Social History   Tobacco Use  Smoking Status Former   Current packs/day: 0.00   Types: Cigarettes   Quit date: 04/12/2011   Years since quitting: 11.6   Passive exposure: Never  Smokeless Tobacco Never   Counseling given: Not Answered   Outpatient Medications Prior to Visit  Medication Sig Dispense Refill    acetaminophen (TYLENOL) 650 MG CR tablet Take 1,950 mg by mouth at bedtime.     ALPRAZolam (XANAX) 0.25 MG tablet Take 0.25 mg by mouth daily as needed for anxiety.      amLODipine (NORVASC) 10 MG tablet Take 10 mg by mouth daily.     aspirin EC 81 MG tablet 1 tablet     atorvastatin (LIPITOR) 40 MG tablet Take 40 mg by mouth at bedtime.      Budeson-Glycopyrrol-Formoterol (BREZTRI AEROSPHERE) 160-9-4.8 MCG/ACT AERO Inhale 2 puffs into the lungs in the morning and at bedtime. 2 each 3   Cyanocobalamin (VITAMIN B12) 1000 MCG TBCR 1 tablet     diphenhydrAMINE (BENADRYL) 25 MG tablet Take 50 mg by mouth at bedtime.     ezetimibe (ZETIA) 10 MG tablet TAKE 1 TABLET(10 MG) BY MOUTH DAILY 90 tablet 2   ferrous sulfate 325 (65 FE) MG tablet Take 325 mg by mouth daily.     fluticasone (FLONASE) 50 MCG/ACT nasal spray Place 2 sprays into both nostrils daily as needed for allergies.     gabapentin (NEURONTIN) 100 MG capsule TAKE 1 CAPSULE(100 MG) BY MOUTH EVERY MORNING 90 capsule 1   gabapentin (NEURONTIN) 300 MG capsule TAKE 2 CAPSULES(600 MG) BY MOUTH AT BEDTIME 180 capsule 0   hydrochlorothiazide (MICROZIDE) 12.5 MG capsule Take 12.5 mg by mouth daily.      levalbuterol (XOPENEX) 0.63 MG/3ML nebulizer solution Use 1 vial via nebulizer every 4 hours as needed for wheezing or shortness of breath. 425 mL 3   omeprazole (PRILOSEC) 20 MG capsule Take 20 mg by mouth daily.     Polyethylene Glycol 3350 (MIRALAX PO) Take 17 g by mouth daily.     primidone (MYSOLINE) 50 MG tablet 2 in the AM, 1 in the PM 270 tablet 3   sertraline (ZOLOFT) 100 MG tablet Take 100 mg by mouth daily.   4   telmisartan (MICARDIS) 40 MG tablet Take 40 mg by mouth daily.     albuterol (PROAIR HFA) 108 (90 Base) MCG/ACT inhaler Inhale 2 puffs every 6 hours as needed (Patient taking differently: Inhale 2 puffs into the lungs every 6 (six) hours as needed for wheezing or shortness of breath. Inhale 2 puffs every 6 hours as needed) 8.5 g 12    No facility-administered medications prior to visit.     Review of Systems:   Constitutional: No weight loss or gain, night sweats, fevers, chills, fatigue, or lassitude. HEENT: No headaches, difficulty swallowing, tooth/dental problems, or sore throat. No sneezing, itching, ear ache, nasal congestion, or post nasal drip CV:  No chest pain, orthopnea, PND, swelling in lower extremities, anasarca, dizziness, palpitations, syncope Resp: +shortness of breath with exertion (baseline); occasional cough. No excess mucus or change in color of mucus. No hemoptysis. No wheezing.  No chest wall deformity GI:  No heartburn, indigestion, abdominal pain, nausea, vomiting, diarrhea, change in bowel habits, loss of appetite, bloody stools.  Skin: No rash, lesions, ulcerations MSK:  No increased joint pain or swelling.   Neuro: No dizziness or lightheadedness.  Psych: No depression or anxiety. Mood  stable.     Physical Exam:  BP 106/68   Pulse 85   Ht 5\' 7"  (1.702 m)   Wt 202 lb 3.2 oz (91.7 kg)   SpO2 96%   BMI 31.67 kg/m   GEN: Pleasant, interactive, well-kempt; obese; in no acute distress HEENT:  Normocephalic and atraumatic. PERRLA. Sclera white. Nasal turbinates pink, moist and patent bilaterally. No rhinorrhea present. Oropharynx pink and moist, without exudate or edema. No lesions, ulcerations, or postnasal drip.  NECK:  Supple w/ fair ROM. No JVD present. Normal carotid impulses w/o bruits. Thyroid symmetrical with no goiter or nodules palpated. No lymphadenopathy.   CV: RRR, no m/r/g, no peripheral edema. Pulses intact, +2 bilaterally. No cyanosis, pallor or clubbing. PULMONARY:  Unlabored, regular breathing. Clear bilaterally A&P w/o wheezes/rales/rhonchi. No accessory muscle use.  GI: BS present and normoactive. Soft, non-tender to palpation. No organomegaly or masses detected.  MSK: No erythema, warmth or tenderness. Cap refil <2 sec all extrem. No deformities or joint swelling  noted.  Neuro: A/Ox3. No focal deficits noted.   Skin: Warm, no lesions or rashe Psych: Normal affect and behavior. Judgement and thought content appropriate.     Lab Results:  CBC    Component Value Date/Time   WBC 5.4 07/25/2018 0330   RBC 2.97 (L) 07/25/2018 0330   HGB 8.9 (L) 07/25/2018 0330   HCT 27.7 (L) 07/25/2018 0330   PLT 245 07/25/2018 0330   MCV 93.3 07/25/2018 0330   MCH 30.0 07/25/2018 0330   MCHC 32.1 07/25/2018 0330   RDW 14.3 07/25/2018 0330   LYMPHSABS 1.2 07/17/2018 0929   MONOABS 0.6 07/17/2018 0929   EOSABS 0.3 07/17/2018 0929   BASOSABS 0.1 07/17/2018 0929    BMET    Component Value Date/Time   NA 137 09/11/2021 1143   K 4.5 09/11/2021 1143   CL 105 09/11/2021 1143   CO2 26 09/11/2021 1143   GLUCOSE 121 (H) 09/11/2021 1143   BUN 21 09/11/2021 1143   CREATININE 1.10 12/07/2021 0835   CALCIUM 9.5 09/11/2021 1143   GFRNONAA >60 09/11/2021 1143   GFRAA >60 07/25/2018 0330    BNP No results found for: "BNP"   Imaging:  No results found.  Administration History     None           No data to display          No results found for: "NITRICOXIDE"      Assessment & Plan:   COPD mixed type (HCC) Compensated on current regimen with moderate symptom burden. No recent exacerbations/hospitalizations. Improvement in chronic bronchitis symptoms with change to [facility]. We will keep him on this. Provided with samples today as his recent out of pocket cost changed. He will call his insurance regarding this. Offered pulmonary rehab; declined for now. Needs refill of albuterol. Action plan in place.   Patient Instructions  Continue Breztri 2 puffs Twice daily. Brush tongue and rinse mouth afterwards Continue Albuterol inhaler 2 puffs or 3 mL neb every 6 hours as needed for shortness of breath or wheezing. Notify if symptoms persist despite rescue inhaler/neb use. Use your neb twice a day before your Breztri  Continue flonase nasal spray 2  sprays each nostril daily  Continue to use CPAP every night, minimum of 4-6 hours a night.  Be aware of reduced alertness and do not drive or operate heavy machinery if experiencing this or drowsiness.   Follow up with oncology as scheduled   Follow up in  4-6 months with Dr. Tonia Brooms or Philis Nettle. If symptoms worsen, please contact office for sooner follow up or seek emergency care.    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 CT imaging from 09/2022 stable. Follow up with radiation oncology as scheduled.   Obstructive sleep apnea Excellent compliance and control. Receives benefit from use. Aware of safe driving practices.    I spent 32 minutes of dedicated to the care of this patient on the date of this encounter to include pre-visit review of records, face-to-face time with the patient discussing conditions above, post visit ordering of testing, clinical documentation with the electronic health record, making appropriate referrals as documented, and communicating necessary findings to members of the patients care team.  Noemi Chapel, NP 12/03/2022  Pt aware and understands NP's role.

## 2022-12-03 NOTE — Assessment & Plan Note (Signed)
Compensated on current regimen with moderate symptom burden. No recent exacerbations/hospitalizations. Improvement in chronic bronchitis symptoms with change to [facility]. We will keep him on this. Provided with samples today as his recent out of pocket cost changed. He will call his insurance regarding this. Offered pulmonary rehab; declined for now. Needs refill of albuterol. Action plan in place.   Patient Instructions  Continue Breztri 2 puffs Twice daily. Brush tongue and rinse mouth afterwards Continue Albuterol inhaler 2 puffs or 3 mL neb every 6 hours as needed for shortness of breath or wheezing. Notify if symptoms persist despite rescue inhaler/neb use. Use your neb twice a day before your Breztri  Continue flonase nasal spray 2 sprays each nostril daily  Continue to use CPAP every night, minimum of 4-6 hours a night.  Be aware of reduced alertness and do not drive or operate heavy machinery if experiencing this or drowsiness.   Follow up with oncology as scheduled   Follow up in 4-6 months with Dr. Tonia Brooms or Florentina Addison [place],NP. If symptoms worsen, please contact office for sooner follow up or seek emergency care.

## 2022-12-03 NOTE — Assessment & Plan Note (Signed)
Excellent compliance and control. Receives benefit from use. Aware of safe driving practices.

## 2022-12-10 NOTE — Progress Notes (Deleted)
Assessment/Plan:    1.  Essential Tremor  -slightly increase primidone 50 mg, 2 in the AM, 1 in the evening  2.  Diplopia, likely the result of an ischemic event, September, 2021  -Now resolved  -Patient's MRI brain with white matter disease and moderate stenosis of the right MCA.  -MRA neck with 50% stenosis of the right carotid (note that carotid ultrasound had been estimating this much higher)  -Patient on Lipitor, 40 mg daily.  LDL was at goal at the time of the event  -Patient on aspirin, 81 mg daily.  3.  Peripheral neuropathy  -On gabapentin, 100 mg in the morning and 600 mg at night.    4.  Carotid stenosis, right  -Followed by vascular surgery.   5.  B12 deficiency  -on oral supplementation  6.  Lung cancer, stage I  -Underwent only radiation therapy and currently no evidence of recurrent disease. Subjective:   Glen [name] was seen today in follow up for essential tremor.  My previous records were reviewed prior to todays visit.    He tolerated the increase in primidone last visit.  He has been doing physical therapy for his knees.  He completed radiation therapy for his stage I lung cancer.  He did have some radiation pneumonitis.  There is no evidence of recurrent disease.  Current prescribed movement disorder medications: 50 mg 2 in the morning, 1 in the evening (slightly increased last visit) Gabapentin, 100 mg in the morning, 600 mg at night   ALLERGIES:   Allergies  Allergen Reactions   Aspirin Other (See Comments)    pt has bleeding ulcers   Lisinopril Cough   Nsaids     Other reaction(s): GI upset   Other     GI Upset (intolerance)  AVOID NSAIDS    CURRENT MEDICATIONS:  Outpatient Encounter Medications as of 12/12/2022  Medication Sig   acetaminophen (TYLENOL) 650 MG CR tablet Take 1,950 mg by mouth at bedtime.   albuterol (PROAIR HFA) 108 (90 Base) MCG/ACT inhaler Inhale 2 puffs into the lungs every 6 (six) hours as needed for wheezing or  shortness of breath. Inhale 2 puffs every 6 hours as needed   ALPRAZolam (XANAX) 0.25 MG tablet Take 0.25 mg by mouth daily as needed for anxiety.    amLODipine (NORVASC) 10 MG tablet Take 10 mg by mouth daily.   aspirin EC 81 MG tablet 1 tablet   atorvastatin (LIPITOR) 40 MG tablet Take 40 mg by mouth at bedtime.    Budeson-Glycopyrrol-Formoterol (BREZTRI AEROSPHERE) 160-9-4.8 MCG/ACT AERO Inhale 2 puffs into the lungs in the morning and at bedtime.   Cyanocobalamin (VITAMIN B12) 1000 MCG TBCR 1 tablet   diphenhydrAMINE (BENADRYL) 25 MG tablet Take 50 mg by mouth at bedtime.   ezetimibe (ZETIA) 10 MG tablet TAKE 1 TABLET(10 MG) BY MOUTH DAILY   ferrous sulfate 325 (65 FE) MG tablet Take 325 mg by mouth daily.   fluticasone (FLONASE) 50 MCG/ACT nasal spray Place 2 sprays into both nostrils daily as needed for allergies.   gabapentin (NEURONTIN) 100 MG capsule TAKE 1 CAPSULE(100 MG) BY MOUTH EVERY MORNING   gabapentin (NEURONTIN) 300 MG capsule TAKE 2 CAPSULES(600 MG) BY MOUTH AT BEDTIME   hydrochlorothiazide (MICROZIDE) 12.5 MG capsule Take 12.5 mg by mouth daily.    levalbuterol (XOPENEX) 0.63 MG/3ML nebulizer solution Use 1 vial via nebulizer every 4 hours as needed for wheezing or shortness of breath.   omeprazole (PRILOSEC) 20 MG  capsule Take 20 mg by mouth daily.   Polyethylene Glycol 3350 (MIRALAX PO) Take 17 g by mouth daily.   primidone (MYSOLINE) 50 MG tablet 2 in the AM, 1 in the PM   sertraline (ZOLOFT) 100 MG tablet Take 100 mg by mouth daily.    telmisartan (MICARDIS) 40 MG tablet Take 40 mg by mouth daily.   No facility-administered encounter medications on file as of 12/12/2022.     Objective:    PHYSICAL EXAMINATION:    VITALS:   There were no vitals filed for this visit.    Wt Readings from Last 3 Encounters:  12/03/22 202 lb 3.2 oz (91.7 kg)  09/20/22 201 lb 4.8 oz (91.3 kg)  07/25/22 201 lb 11.2 oz (91.5 kg)     GEN:  The patient appears stated age and is in  NAD. HEENT:  Normocephalic, atraumatic.   Neurological examination:  Orientation: The patient is alert and oriented x3. Cranial nerves: There is good facial symmetry. The speech is fluent and clear. Soft palate rises symmetrically and there is no tongue deviation. Hearing is intact to conversational tone. Sensation: Sensation is intact to light touch throughout Motor: Strength is at least antigravity x4.  Movement examination: Tone: There is normal tone in the UE/LE Abnormal movements: he has mild postural tremor (he states it gets worse).    Mild trouble with archimedes, L>R Coordination:  There is no decremation with RAM's Gait and Station: The patient has no difficulty arising out of a deep-seated chair without the use of the hands.   He has slight trouble in the turn I have reviewed and interpreted the following labs independently   Chemistry      Component Value Date/Time   NA 137 09/11/2021 1143   K 4.5 09/11/2021 1143   CL 105 09/11/2021 1143   CO2 26 09/11/2021 1143   BUN 21 09/11/2021 1143   CREATININE 1.10 12/07/2021 0835      Component Value Date/Time   CALCIUM 9.5 09/11/2021 1143   ALKPHOS 85 07/17/2018 0929   AST 19 07/17/2018 0929   ALT 14 07/17/2018 0929   BILITOT 0.6 07/17/2018 0929      Lab Results  Component Value Date   WBC 5.4 07/25/2018   HGB 8.9 (L) 07/25/2018   HCT 27.7 (L) 07/25/2018   MCV 93.3 07/25/2018   PLT 245 07/25/2018   Lab Results  Component Value Date   TSH 1.48 11/20/2015     Chemistry      Component Value Date/Time   NA 137 09/11/2021 1143   K 4.5 09/11/2021 1143   CL 105 09/11/2021 1143   CO2 26 09/11/2021 1143   BUN 21 09/11/2021 1143   CREATININE 1.10 12/07/2021 0835      Component Value Date/Time   CALCIUM 9.5 09/11/2021 1143   ALKPHOS 85 07/17/2018 0929   AST 19 07/17/2018 0929   ALT 14 07/17/2018 0929   BILITOT 0.6 07/17/2018 0929     Lab Results  Component Value Date   VITAMINB12 244 04/03/2018    Total  time spent on today's visit was *** minutes, including both face-to-face time and nonface-to-face time.  Time included that spent on review of records (prior notes available to me/labs/imaging if pertinent), discussing treatment and goals, answering patient's questions and coordinating care.   Cc:  Darrow Bussing, MD

## 2022-12-12 ENCOUNTER — Ambulatory Visit: Payer: Medicare PPO | Admitting: Neurology

## 2022-12-13 ENCOUNTER — Ambulatory Visit: Payer: Medicare PPO | Admitting: Neurology

## 2022-12-25 DIAGNOSIS — I10 Essential (primary) hypertension: Secondary | ICD-10-CM | POA: Diagnosis not present

## 2022-12-25 DIAGNOSIS — J449 Chronic obstructive pulmonary disease, unspecified: Secondary | ICD-10-CM | POA: Diagnosis not present

## 2022-12-25 DIAGNOSIS — I69351 Hemiplegia and hemiparesis following cerebral infarction affecting right dominant side: Secondary | ICD-10-CM | POA: Diagnosis not present

## 2022-12-25 DIAGNOSIS — I700 Atherosclerosis of aorta: Secondary | ICD-10-CM | POA: Diagnosis not present

## 2022-12-25 DIAGNOSIS — F321 Major depressive disorder, single episode, moderate: Secondary | ICD-10-CM | POA: Diagnosis not present

## 2022-12-25 DIAGNOSIS — U071 COVID-19: Secondary | ICD-10-CM | POA: Diagnosis not present

## 2022-12-25 DIAGNOSIS — R509 Fever, unspecified: Secondary | ICD-10-CM | POA: Diagnosis not present

## 2023-01-11 ENCOUNTER — Other Ambulatory Visit: Payer: Self-pay | Admitting: Neurology

## 2023-01-11 DIAGNOSIS — G250 Essential tremor: Secondary | ICD-10-CM

## 2023-01-14 NOTE — Telephone Encounter (Signed)
Patient last seen on 12/12/21 patient no showed appointment on 12/12/22 and is now scheduled for 02/24/23 Approval to refill meds

## 2023-01-15 ENCOUNTER — Telehealth: Payer: Self-pay | Admitting: Neurology

## 2023-01-15 ENCOUNTER — Other Ambulatory Visit: Payer: Self-pay

## 2023-01-15 DIAGNOSIS — G250 Essential tremor: Secondary | ICD-10-CM

## 2023-01-15 MED ORDER — GABAPENTIN 100 MG PO CAPS: 100.0000 mg | ORAL_CAPSULE | Freq: Every morning | ORAL | 0 refills | Status: DC

## 2023-01-15 NOTE — Telephone Encounter (Signed)
Prescription has been sent patient called I misunderstood he is taking 100 and 300 mg of gabapentin. Called patient and let him know

## 2023-01-15 NOTE — Telephone Encounter (Signed)
Pt is calling in to see if he is able to get the refill on Rx gabapentin (NEURONTIN).  Pt has an appt with Dr. Arbutus Leas 02/27/2023 @ 11:15.  Pt would like to have a call back.

## 2023-01-16 NOTE — Progress Notes (Signed)
This encounter was created in error - please disregard.

## 2023-01-22 ENCOUNTER — Ambulatory Visit: Payer: Medicare PPO | Admitting: Cardiology

## 2023-01-22 NOTE — Progress Notes (Deleted)
Patient referred by Darrow Bussing, MD for coronary calcification  Subjective:   Glen [name], male    DOB: May 08, 1941, 82 y.o.   MRN: [id number]   No chief complaint on file.    HPI  82 y.o. Caucasian male with hyperlipidemia, prediabetes, carotid artery disease, COPD, peripheral neuropathy, major depression, h/o colon cancer, h/o lung cancer with recently concluded radiation therapy  *** Since the diagnosis of lung cancer and subsequent radiation therapy, patient has had worsening exertional dyspnea and cough.  Recent CT scan showed significant amount of fibrosis.  He does not need oxygen, but gets short of breath with minimal activity.  He denies any chest pain.  On a separate note, he has upcoming follow-up with Dr. Edilia Bo regarding carotid stenosis.    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 He enjoys playing the banjo and plays regularly with his friends. Patient recently underwent CT scan for monitoring of lung nodules. This showed Three-vessel coronary artery calcifications and atherosclerotic disease of aorta.  Patient denies any chest pain. He has stable, unchanged exertional dyspnea for several years. He is already on Aspirin and lipitor for carotid artery disease-asymptomatic moderate right carotid stenosis, managed by Dr. Durwin Nora.   Current Outpatient Medications:    acetaminophen (TYLENOL) 650 MG CR tablet, Take 1,950 mg by mouth at bedtime., Disp: , Rfl:    albuterol (PROAIR HFA) 108 (90 Base) MCG/ACT inhaler, Inhale 2 puffs into the lungs every 6 (six) hours as needed for wheezing or shortness of breath. Inhale 2 puffs every 6 hours as needed, Disp: 17 each, Rfl: 5   ALPRAZolam (XANAX) 0.25 MG tablet, Take 0.25 mg by mouth daily as needed for anxiety. , Disp: , Rfl:    amLODipine (NORVASC) 10 MG tablet, Take 10 mg by mouth daily., Disp: , Rfl:    aspirin EC 81 MG tablet, 1 tablet, Disp: ,  Rfl:    atorvastatin (LIPITOR) 40 MG tablet, Take 40 mg by mouth at bedtime. , Disp: , Rfl:    Budeson-Glycopyrrol-Formoterol (BREZTRI AEROSPHERE) 160-9-4.8 MCG/ACT AERO, Inhale 2 puffs into the lungs in the morning and at bedtime., Disp: 2 each, Rfl: 3   Cyanocobalamin (VITAMIN B12) 1000 MCG TBCR, 1 tablet, Disp: , Rfl:    diphenhydrAMINE (BENADRYL) 25 MG tablet, Take 50 mg by mouth at bedtime., Disp: , Rfl:    ezetimibe (ZETIA) 10 MG tablet, TAKE 1 TABLET(10 MG) BY MOUTH DAILY, Disp: 90 tablet, Rfl: 2   ferrous sulfate 325 (65 FE) MG tablet, Take 325 mg by mouth daily., Disp: , Rfl:    fluticasone (FLONASE) 50 MCG/ACT nasal spray, Place 2 sprays into both nostrils daily as needed for allergies., Disp: , Rfl:    gabapentin (NEURONTIN) 100 MG capsule, Take 1 capsule (100 mg total) by mouth in the morning., Disp: 90 capsule, Rfl: 0   gabapentin (NEURONTIN) 300 MG capsule, TAKE 2 CAPSULES(600 MG) BY MOUTH AT BEDTIME, Disp: 180 capsule, Rfl: 0   hydrochlorothiazide (MICROZIDE) 12.5 MG capsule, Take 12.5 mg by mouth daily. , Disp: , Rfl:    levalbuterol (XOPENEX) 0.63 MG/3ML nebulizer solution, Use 1 vial via nebulizer every 4 hours as needed for wheezing or shortness of breath., Disp: 425 mL, Rfl: 3   omeprazole (PRILOSEC) 20 MG capsule, Take 20 mg by mouth daily., Disp: , Rfl:    Polyethylene Glycol 3350 (MIRALAX PO), Take 17 g by mouth daily., Disp: , Rfl:  primidone (MYSOLINE) 50 MG tablet, 2 in the AM, 1 in the PM, Disp: 270 tablet, Rfl: 3   sertraline (ZOLOFT) 100 MG tablet, Take 100 mg by mouth daily. , Disp: , Rfl: 4   telmisartan (MICARDIS) 40 MG tablet, Take 40 mg by mouth daily., Disp: , Rfl:   Cardiovascular and other pertinent studies:  EKG 07/19/2022: Sinus rhythm 73 bpm Low voltage   Echocardiogram 07/23/2022:  Left ventricle cavity is normal in size and wall thickness. Normal global  wall motion. Normal LV systolic function with EF 65%. Normal diastolic  filling pattern.   Trileaflet aortic valve with no regurgitation. Mild aortic valve leaflet  calcification.  Aneurysmal interatrial septum without 2D or color Doppler evidence of  shunting.  No evidence of pulmonary hypertension.  No significant change compared to previous study in 2015.   Carotid US 07/25/2022: Right Carotid: Velocities in the right ICA are consistent with a 60-79% stenosis.  Left Carotid: Velocities in the left ICA are consistent with a 1-39% stenosis.  Vertebrals: Bilateral vertebral arteries demonstrate antegrade flow.  Subclavians: Normal flow hemodynamics were seen in bilateral subclavian               arteries.   Carotid duplex 05/2021: Right Carotid:  Velocities in the right ICA are consistent with a 60-79%  stenosis. Category of stenosis is the same as on the previous exam, however the velocities are less.  Left Carotid:  Velocities in the left ICA are consistent with a 1-39% stenosis.   Vertebrals: Bilateral vertebral arteries demonstrate antegrade flow.  Subclavians: Normal flow hemodynamics were seen in bilateral subclavian arteries.   CT chest 05/28/2022: 1. Post radiation fibrosis in the right upper lobe is similar to prior although it has become somewhat more confluent inferiorly with more prominent volume loss. The increased confluence is felt to be related to the associated volume loss, but attention in this region on follow-up recommended. 2. The 9 x 4 mm right upper lobe pulmonary nodule measured previously is no longer discretely evident in the background scarring. 3. Interval resolution of right lower lobe subpleural nodule and clustered tree-in-bud nodularity seen previously in the left lower lobe. No new suspicious pulmonary nodule or mass. 4. Emphysema (ICD10-J43.9) and Aortic Atherosclerosis (ICD10-170.0)      Carotid US 10/04/2020: Right Carotid: Velocities in the right ICA are consistent with a 60-79%                 stenosis. Calcified plaque with  acoustic shadowing in the                 proximal segment may underestimate level of disease.  Left Carotid: Velocities in the left ICA are consistent with a 1-39%  stenosis.  Vertebrals:  Bilateral vertebral arteries demonstrate antegrade flow.  Subclavians: Normal flow hemodynamics were seen in bilateral subclavian               arteries.   Recent labs: 02/05/2022: Glucose 128, BUN/Cr 22/1.0. EGFR 74. K 4.3. Hb 12.7 HbA1C 6.1% Chol 106, TG 188, HDL 33, LDL 43 TSH 1.5 normal   Review of Systems  Cardiovascular:  Positive for dyspnea on exertion. Negative for chest pain, leg swelling, palpitations and syncope.         There were no vitals filed for this visit.   There is no height or weight on file to calculate BMI. There were no vitals filed for this visit.    Objective:   Physical Exam Vitals  and nursing note reviewed.  Constitutional:      General: He is not in acute distress. Neck:     Vascular: No JVD.  Cardiovascular:     Rate and Rhythm: Normal rate and regular rhythm.     Pulses:          Carotid pulses are  on the right side with bruit.    Heart sounds: Normal heart sounds. No murmur heard. Pulmonary:     Effort: Pulmonary effort is normal.     Breath sounds: Normal breath sounds. No wheezing or rales.  Musculoskeletal:     Right lower leg: No edema.     Left lower leg: No edema.          Assessment & Recommendations:   82 y.o. Caucasian male with hyperlipidemia, prediabetes, carotid artery disease, COPD, peripheral neuropathy, major depression, h/o colon cancer, h/o lung cancer with recently concluded radiation therapy  *** Coronary calcification: Incidental finding on CT chest. No angina symptoms at this time.  Recent increase in exertional dyspnea, with underlying COPD and lung cancer. Will obtain echocardiogram to rule out any component of congestive heart failure, although less likely. Mild exertional dyspnea stable for several years is  likely due to COPD. Continue Aspirin, lipitor, zetia.  Lipids very well controlled,  Carotid artery disease: Bilateral carotid bruit.  Has follow-up with Dr. Edilia Bo  Hypertension: Mildly elevated today, generally well-controlled.  No changes made today.  F/u in 6 months    Elder Negus, MD Pager: [phone] Office: [phone]

## 2023-01-28 ENCOUNTER — Other Ambulatory Visit: Payer: Self-pay | Admitting: Neurology

## 2023-01-28 DIAGNOSIS — G250 Essential tremor: Secondary | ICD-10-CM

## 2023-01-28 DIAGNOSIS — G609 Hereditary and idiopathic neuropathy, unspecified: Secondary | ICD-10-CM

## 2023-02-04 ENCOUNTER — Other Ambulatory Visit: Payer: Self-pay | Admitting: Neurology

## 2023-02-04 DIAGNOSIS — G250 Essential tremor: Secondary | ICD-10-CM

## 2023-02-06 ENCOUNTER — Encounter (HOSPITAL_COMMUNITY): Payer: Medicare PPO

## 2023-02-06 ENCOUNTER — Ambulatory Visit: Payer: Medicare PPO

## 2023-02-09 ENCOUNTER — Other Ambulatory Visit: Payer: Self-pay | Admitting: Neurology

## 2023-02-09 DIAGNOSIS — G250 Essential tremor: Secondary | ICD-10-CM

## 2023-02-09 DIAGNOSIS — G609 Hereditary and idiopathic neuropathy, unspecified: Secondary | ICD-10-CM

## 2023-02-14 ENCOUNTER — Ambulatory Visit: Payer: Medicare PPO | Admitting: Neurology

## 2023-02-14 DIAGNOSIS — E7800 Pure hypercholesterolemia, unspecified: Secondary | ICD-10-CM | POA: Diagnosis not present

## 2023-02-14 DIAGNOSIS — J701 Chronic and other pulmonary manifestations due to radiation: Secondary | ICD-10-CM | POA: Diagnosis not present

## 2023-02-14 DIAGNOSIS — R7303 Prediabetes: Secondary | ICD-10-CM | POA: Diagnosis not present

## 2023-02-14 DIAGNOSIS — Z23 Encounter for immunization: Secondary | ICD-10-CM | POA: Diagnosis not present

## 2023-02-14 DIAGNOSIS — F321 Major depressive disorder, single episode, moderate: Secondary | ICD-10-CM | POA: Diagnosis not present

## 2023-02-14 DIAGNOSIS — C3411 Malignant neoplasm of upper lobe, right bronchus or lung: Secondary | ICD-10-CM | POA: Diagnosis not present

## 2023-02-14 DIAGNOSIS — Z79899 Other long term (current) drug therapy: Secondary | ICD-10-CM | POA: Diagnosis not present

## 2023-02-14 DIAGNOSIS — I10 Essential (primary) hypertension: Secondary | ICD-10-CM | POA: Diagnosis not present

## 2023-02-14 DIAGNOSIS — Z0000 Encounter for general adult medical examination without abnormal findings: Secondary | ICD-10-CM | POA: Diagnosis not present

## 2023-02-14 DIAGNOSIS — J449 Chronic obstructive pulmonary disease, unspecified: Secondary | ICD-10-CM | POA: Diagnosis not present

## 2023-02-24 ENCOUNTER — Other Ambulatory Visit: Payer: Self-pay | Admitting: Pulmonary Disease

## 2023-02-24 DIAGNOSIS — J449 Chronic obstructive pulmonary disease, unspecified: Secondary | ICD-10-CM

## 2023-02-25 NOTE — Progress Notes (Unsigned)
Assessment/Plan:    1.  Essential Tremor  -Continue primidone 50 mg, 2 in the AM, 1 in the evening  2.  Diplopia, likely the result of an ischemic event, September, 2021  -Now resolved  -Patient's MRI brain with white matter disease and moderate stenosis of the right MCA.  -MRA neck with 50% stenosis of the right carotid (note that carotid ultrasound had been estimating this much higher)  -Patient on Lipitor, 40 mg daily.  LDL was at goal at the time of the event  -Patient on aspirin, 81 mg daily.  3.  Peripheral neuropathy  -On gabapentin, 100 mg in the morning and 600 mg at night.  Tried to go down to 300 mg at night and symptoms worse.  Will increase back  4.  Carotid stenosis, right  -Followed by vascular surgery.   5.  B12 deficiency  -on oral supplementation  6.  Lung cancer, stage I  -Underwent only radiation therapy and currently no evidence of recurrent disease. Subjective:   Glen [name] was seen today in follow up for essential tremor.  My previous records were reviewed prior to todays visit.  He tolerated the increase in primidone last visit.  He has been doing physical therapy for his knees.  He completed radiation therapy for his stage I lung cancer.  He did have some radiation pneumonitis.  There is no evidence of recurrent disease.  He's under stress b/c his sister had a cerebral hemmorhage with R sided paralysis.  She has had some significant recovery but is still having trouble with speech and memory.  Current prescribed movement disorder medications: Primidone, 50 mg 2 in the morning, 1 in the evening (slightly increased last visit) Gabapentin, 100 mg in the morning, 600 mg at night   ALLERGIES:   Allergies  Allergen Reactions   Aspirin Other (See Comments)    pt has bleeding ulcers   Lisinopril Cough   Nsaids     Other reaction(s): GI upset   Other     GI Upset (intolerance)  AVOID NSAIDS    CURRENT MEDICATIONS:  Outpatient Encounter  Medications as of 02/27/2023  Medication Sig   acetaminophen (TYLENOL) 650 MG CR tablet Take 1,950 mg by mouth at bedtime.   albuterol (PROAIR HFA) 108 (90 Base) MCG/ACT inhaler Inhale 2 puffs into the lungs every 6 (six) hours as needed for wheezing or shortness of breath. Inhale 2 puffs every 6 hours as needed   ALPRAZolam (XANAX) 0.25 MG tablet Take 0.25 mg by mouth daily as needed for anxiety.    amLODipine (NORVASC) 10 MG tablet Take 10 mg by mouth daily.   aspirin EC 81 MG tablet 1 tablet   atorvastatin (LIPITOR) 40 MG tablet Take 40 mg by mouth at bedtime.    Budeson-Glycopyrrol-Formoterol (BREZTRI AEROSPHERE) 160-9-4.8 MCG/ACT AERO INHALE 2 PUFFS INTO THE LUNGS IN THE MORNING AND AT BEDTIME   Cyanocobalamin (VITAMIN B12) 1000 MCG TBCR 1 tablet   diphenhydrAMINE (BENADRYL) 25 MG tablet Take 50 mg by mouth at bedtime.   ezetimibe (ZETIA) 10 MG tablet TAKE 1 TABLET(10 MG) BY MOUTH DAILY   ferrous sulfate 325 (65 FE) MG tablet Take 325 mg by mouth daily.   fluticasone (FLONASE) 50 MCG/ACT nasal spray Place 2 sprays into both nostrils daily as needed for allergies.   gabapentin (NEURONTIN) 100 MG capsule Take 1 capsule (100 mg total) by mouth in the morning.   gabapentin (NEURONTIN) 300 MG capsule TAKE 2 CAPSULES(600 MG) BY  MOUTH AT BEDTIME.   hydrochlorothiazide (MICROZIDE) 12.5 MG capsule Take 12.5 mg by mouth daily.    levalbuterol (XOPENEX) 0.63 MG/3ML nebulizer solution Use 1 vial via nebulizer every 4 hours as needed for wheezing or shortness of breath.   omeprazole (PRILOSEC) 20 MG capsule Take 20 mg by mouth daily.   Polyethylene Glycol 3350 (MIRALAX PO) Take 17 g by mouth daily.   primidone (MYSOLINE) 50 MG tablet TAKE 2 TABLETS BY MOUTH IN THE MORNING AND 1 TABLET IN THE EVENING   sertraline (ZOLOFT) 100 MG tablet Take 100 mg by mouth daily.    telmisartan (MICARDIS) 40 MG tablet Take 40 mg by mouth daily.   No facility-administered encounter medications on file as of 02/27/2023.      Objective:    PHYSICAL EXAMINATION:    VITALS:   Vitals:   02/27/23 1053  BP: 118/76  Pulse: 71  SpO2: 97%  Weight: 191 lb 6.4 oz (86.8 kg)  Height: 5\' 7"  (1.702 m)      Wt Readings from Last 3 Encounters:  02/27/23 191 lb 6.4 oz (86.8 kg)  12/03/22 202 lb 3.2 oz (91.7 kg)  09/20/22 201 lb 4.8 oz (91.3 kg)     GEN:  The patient appears stated age and is in NAD. HEENT:  Normocephalic, atraumatic.  Cv; RRR Lungs: Clear to auscultation bilaterally  Neurological examination:  Orientation: The patient is alert and oriented x3. Cranial nerves: There is good facial symmetry. The speech is fluent and clear. Soft palate rises symmetrically and there is no tongue deviation. Hearing is intact to conversational tone. Sensation: Sensation is intact to light touch throughout Motor: Strength is at least antigravity x4.  Movement examination: Tone: There is normal tone in the UE/LE Abnormal movements: he has no significant postural or intention tremor today.  He has mild tremor with Archimedes spirals, left greater than right.  Coordination:  There is no decremation with RAM's Gait and Station: The patient ambulates well in the hall I have reviewed and interpreted the following labs independently   Chemistry      Component Value Date/Time   NA 137 09/11/2021 1143   K 4.5 09/11/2021 1143   CL 105 09/11/2021 1143   CO2 26 09/11/2021 1143   BUN 21 09/11/2021 1143   CREATININE 1.10 12/07/2021 0835      Component Value Date/Time   CALCIUM 9.5 09/11/2021 1143   ALKPHOS 85 07/17/2018 0929   AST 19 07/17/2018 0929   ALT 14 07/17/2018 0929   BILITOT 0.6 07/17/2018 0929      Lab Results  Component Value Date   WBC 5.4 07/25/2018   HGB 8.9 (L) 07/25/2018   HCT 27.7 (L) 07/25/2018   MCV 93.3 07/25/2018   PLT 245 07/25/2018   Lab Results  Component Value Date   TSH 1.48 11/20/2015     Chemistry      Component Value Date/Time   NA 137 09/11/2021 1143   K 4.5  09/11/2021 1143   CL 105 09/11/2021 1143   CO2 26 09/11/2021 1143   BUN 21 09/11/2021 1143   CREATININE 1.10 12/07/2021 0835      Component Value Date/Time   CALCIUM 9.5 09/11/2021 1143   ALKPHOS 85 07/17/2018 0929   AST 19 07/17/2018 0929   ALT 14 07/17/2018 0929   BILITOT 0.6 07/17/2018 0929     Lab Results  Component Value Date   VITAMINB12 244 04/03/2018      Cc:  Darrow Bussing, MD

## 2023-02-26 ENCOUNTER — Other Ambulatory Visit: Payer: Self-pay | Admitting: Neurology

## 2023-02-26 DIAGNOSIS — G250 Essential tremor: Secondary | ICD-10-CM

## 2023-02-27 ENCOUNTER — Ambulatory Visit: Payer: Medicare PPO | Admitting: Neurology

## 2023-02-27 ENCOUNTER — Encounter: Payer: Self-pay | Admitting: Neurology

## 2023-02-27 DIAGNOSIS — G250 Essential tremor: Secondary | ICD-10-CM | POA: Diagnosis not present

## 2023-02-27 DIAGNOSIS — G609 Hereditary and idiopathic neuropathy, unspecified: Secondary | ICD-10-CM

## 2023-02-27 MED ORDER — GABAPENTIN 300 MG PO CAPS: ORAL_CAPSULE | ORAL | 3 refills | Status: DC

## 2023-03-04 DIAGNOSIS — G4733 Obstructive sleep apnea (adult) (pediatric): Secondary | ICD-10-CM | POA: Diagnosis not present

## 2023-03-07 ENCOUNTER — Ambulatory Visit: Payer: Medicare PPO | Admitting: Physician Assistant

## 2023-03-07 ENCOUNTER — Ambulatory Visit (HOSPITAL_COMMUNITY)
Admission: RE | Admit: 2023-03-07 | Discharge: 2023-03-07 | Disposition: A | Discharge status: Home / Self Care | Payer: Medicare PPO | Source: Ambulatory Visit | Attending: Vascular Surgery | Admitting: Vascular Surgery

## 2023-03-07 VITALS — BP 117/54 | HR 81 | Temp 98.1°F | Ht 67.0 in | Wt 192.0 lb

## 2023-03-07 DIAGNOSIS — I6523 Occlusion and stenosis of bilateral carotid arteries: Secondary | ICD-10-CM | POA: Diagnosis not present

## 2023-03-07 NOTE — Progress Notes (Signed)
Office Note     CC:  follow up Requesting Provider:  Darrow Bussing, MD  HPI: Glen [name] is a 82 y.o. (10/24/1940) male who presents for routine follow up of carotid artery stenosis. He has no history of TIA or Stroke. We have been following his bilateral ICA stenosis with right > left. His right has been in the 60-79% range.   Today he reports that he overall is doing well. He completed radiation for lung cancer in July. He reports that since then and with his COPD he does experience some shortness of breath but otherwise has been feeling good. He denies any visual changes, slurred speech, facial drooping, unilateral upper or lower extremity weakness or numbness. He is medically managed on Aspirin and statin. He does report easy bruising but he says that his father did also. Aside from arthritis pains he say he does not have any pain on ambulation or rest, no tissue loss. He does have neuropathy so he reports some numbness in his toes. He tries to stay active. 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 He piddles around with his tools and things around his house. He also is a Surveyor, mining so enjoys playing frequently.    He is medically managed on Aspirin and statin He takes CCB, ARB, HCTZ for hypertension He is not a diabetic He is a former smoker, quit 2012  Past Medical History:  Diagnosis Date   Allergic rhinitis, cause unspecified    Anxiety    Asthma    Cancer (HCC) 07/21/2018   colon   COPD (chronic obstructive pulmonary disease) (HCC)    Depression    Dyspnea    upon exertion   GERD (gastroesophageal reflux disease)    Hypertension    Pre-diabetes    PUD (peptic ulcer disease)    avoids aspirin   Sleep apnea    uses C-PAP    Past Surgical History:  Procedure Laterality Date   BRONCHIAL BIOPSY  09/11/2021   Procedure: BRONCHIAL BIOPSIES;  Surgeon: Josephine Igo, DO;  Location: [facility];  Service: Pulmonary;;   BRONCHIAL  BRUSHINGS  09/11/2021   Procedure: BRONCHIAL BRUSHINGS;  Surgeon: Josephine Igo, DO;  Location: [facility];  Service: Pulmonary;;   CATARACT EXTRACTION, BILATERAL     COLONOSCOPY     FIDUCIAL MARKER PLACEMENT  09/11/2021   Procedure: FIDUCIAL MARKER PLACEMENT;  Surgeon: Josephine Igo, DO;  Location: [facility];  Service: Pulmonary;;   LAPAROSCOPIC LYSIS OF ADHESIONS N/A 07/21/2018   Procedure: Laparoscopic Lysis Of Adhesions;  Surgeon: Claud Kelp, MD;  Location: [facility];  Service: General;  Laterality: N/A;   LAPAROSCOPIC PARTIAL COLECTOMY Left 07/21/2018   Procedure: LAPARASCOPIC TAKEDOWN OF SPLENIC FLEXURE; OPEN LEFT COLETCTOMY WITH PRIMARY ANASTOMOSIS;  Surgeon: Claud Kelp, MD;  Location: [facility];  Service: General;  Laterality: Left;   NASAL SEPTUM SURGERY  1960's   TONSILLECTOMY     UMBILICAL HERNIA REPAIR N/A 07/21/2018   Procedure: PRIMARY REPAIR OF UMBILICAL HERNIA , ADULT;  Surgeon: Claud Kelp, MD;  Location: [facility];  Service: General;  Laterality: N/A;   VIDEO BRONCHOSCOPY WITH RADIAL ENDOBRONCHIAL ULTRASOUND  09/11/2021   Procedure: RADIAL ENDOBRONCHIAL ULTRASOUND;  Surgeon: Josephine Igo, DO;  Location: [facility];  Service: Pulmonary;;    Social History   Socioeconomic History   Marital status: Married    Spouse name: Not on file   Number of children: 0   Years of education:  Not on file   Highest education level: Bachelor's degree (e.g., BA, AB, BS)  Occupational History   Occupation: retired Agricultural consultant  Tobacco Use   Smoking status: Former    Current packs/day: 0.00    Types: Cigarettes    Quit date: 04/12/2011    Years since quitting: 11.9    Passive exposure: Never   Smokeless tobacco: Never  Vaping Use   Vaping status: Never Used  Substance and Sexual Activity   Alcohol use: Yes    Alcohol/week: 0.0 - 6.0 standard drinks of alcohol    Comment: 1 liquor drink, 1 glass wine eveyr 2-3 weeks   Drug use: No   Sexual activity: Not  on file  Other Topics Concern   Not on file  Social History Narrative   Left handed   One story home   Drinks caffeine   Social Determinants of Health   Financial Resource Strain: Not on file  Food Insecurity: Not on file  Transportation Needs: Not on file  Physical Activity: Not on file  Stress: Not on file  Social Connections: Not on file  Intimate Partner Violence: Not on file    Family History  Problem Relation Age of Onset   Alcohol abuse Father        commited suicide   Heart disease Father        had bypass   Breast cancer Mother    Prostate cancer Maternal Uncle        several     Current Outpatient Medications  Medication Sig Dispense Refill   acetaminophen (TYLENOL) 650 MG CR tablet Take 1,950 mg by mouth at bedtime.     albuterol (PROAIR HFA) 108 (90 Base) MCG/ACT inhaler Inhale 2 puffs into the lungs every 6 (six) hours as needed for wheezing or shortness of breath. Inhale 2 puffs every 6 hours as needed 17 each 5   ALPRAZolam (XANAX) 0.25 MG tablet Take 0.25 mg by mouth daily as needed for anxiety.      amLODipine (NORVASC) 10 MG tablet Take 10 mg by mouth daily.     aspirin EC 81 MG tablet 1 tablet     atorvastatin (LIPITOR) 40 MG tablet Take 40 mg by mouth at bedtime.      Budeson-Glycopyrrol-Formoterol (BREZTRI AEROSPHERE) 160-9-4.8 MCG/ACT AERO INHALE 2 PUFFS INTO THE LUNGS IN THE MORNING AND AT BEDTIME 10.7 g 11   Cyanocobalamin (VITAMIN B12) 1000 MCG TBCR 1 tablet     diphenhydrAMINE (BENADRYL) 25 MG tablet Take 50 mg by mouth at bedtime.     ezetimibe (ZETIA) 10 MG tablet TAKE 1 TABLET(10 MG) BY MOUTH DAILY 90 tablet 2   ferrous sulfate 325 (65 FE) MG tablet Take 325 mg by mouth daily.     fluticasone (FLONASE) 50 MCG/ACT nasal spray Place 2 sprays into both nostrils daily as needed for allergies.     gabapentin (NEURONTIN) 100 MG capsule Take 1 capsule (100 mg total) by mouth in the morning. 90 capsule 0   gabapentin (NEURONTIN) 300 MG capsule TAKE 2  CAPSULES(600 MG) BY MOUTH AT BEDTIME 180 capsule 3   hydrochlorothiazide (MICROZIDE) 12.5 MG capsule Take 12.5 mg by mouth daily.      levalbuterol (XOPENEX) 0.63 MG/3ML nebulizer solution Use 1 vial via nebulizer every 4 hours as needed for wheezing or shortness of breath. 425 mL 3   omeprazole (PRILOSEC) 20 MG capsule Take 20 mg by mouth daily.     Polyethylene Glycol 3350 (MIRALAX PO)  Take 17 g by mouth daily.     primidone (MYSOLINE) 50 MG tablet TAKE 2 TABLETS BY MOUTH IN THE MORNING AND 1 TABLET IN THE EVENING 270 tablet 0   sertraline (ZOLOFT) 100 MG tablet Take 100 mg by mouth daily.   4   telmisartan (MICARDIS) 40 MG tablet Take 40 mg by mouth daily.     No current facility-administered medications for this visit.    Allergies  Allergen Reactions   Aspirin Other (See Comments)    pt has bleeding ulcers   Lisinopril Cough   Nsaids     Other reaction(s): GI upset   Other     GI Upset (intolerance)  AVOID NSAIDS     REVIEW OF SYSTEMS:  [X]  denotes positive finding, [ ]  denotes negative finding Cardiac  Comments:  Chest pain or chest pressure:    Shortness of breath upon exertion:    Short of breath when lying flat:    Irregular heart rhythm:        Vascular    Pain in calf, thigh, or hip brought on by ambulation:    Pain in feet at night that wakes you up from your sleep:     Blood clot in your veins:    Leg swelling:         Pulmonary    Oxygen at home:    Productive cough:     Wheezing:         Neurologic    Sudden weakness in arms or legs:     Sudden numbness in arms or legs:     Sudden onset of difficulty speaking or slurred speech:    Temporary loss of vision in one eye:     Problems with dizziness:         Gastrointestinal    Blood in stool:     Vomited blood:         Genitourinary    Burning when urinating:     Blood in urine:        Psychiatric    Major depression:         Hematologic    Bleeding problems:    Problems with blood clotting  too easily:        Skin    Rashes or ulcers:        Constitutional    Fever or chills:      PHYSICAL EXAMINATION:  Vitals:   03/07/23 1002  BP: (!) 117/54  Pulse: 81  Temp: 98.1 F (36.7 C)  SpO2: 94%  Weight: 192 lb (87.1 kg)  Height: 5\' 7"  (1.702 m)    General:  WDWN in NAD; vital signs documented above Gait: Normal HENT: WNL, normocephalic Pulmonary: normal non-labored breathing without wheezing Cardiac: regular HR; carotid bruit on right  Abdomen: soft Vascular Exam/Pulses: 2+ radial pulse, 2+ DP pulses bilaterally Extremities: without ischemic changes, without Gangrene , without cellulitis; without open wounds;  Musculoskeletal: no muscle wasting or atrophy  Neurologic: A&O X 3 Psychiatric:  The pt has Normal affect.   Non-Invasive Vascular Imaging:   VAS Carotid Duplex: Summary:  Right Carotid: Velocities in the right ICA are consistent with a 60-79% stenosis. The ECA appears >50% stenosed.   Left Carotid: Velocities in the left ICA are consistent with a 1-39% stenosis. The ECA appears >50% stenosed.   Vertebrals: Bilateral vertebral arteries demonstrate antegrade flow.    ASSESSMENT/PLAN:: 82 y.o. male here for follow up for carotid artery stenosis. He has no  history of TIA or Stroke. He remains without any associated symptoms.  - Carotid duplex overall is stable, essentially unchanged from prior duplex 6 months ago. Right ICA with 60-79% stenosis, left ICA 1-39% ICA stenosis. Normal flow in the vertebral arteries - Continue Aspirin and statin  - Reviewed signs and symptoms of TIA/ stroke with patient and he understands should these occur to seek immediate medical attention -He will return for follow up again in 6 months with repeat carotid duplex   Graceann Congress, PA-C [facility] [phone]  Clinic MD:   Rosemarie Ax

## 2023-03-24 ENCOUNTER — Inpatient Hospital Stay: Payer: Medicare PPO

## 2023-03-24 ENCOUNTER — Telehealth: Payer: Self-pay

## 2023-03-24 ENCOUNTER — Telehealth: Payer: Self-pay | Admitting: *Deleted

## 2023-03-24 ENCOUNTER — Inpatient Hospital Stay: Payer: Medicare PPO | Attending: Oncology | Admitting: Oncology

## 2023-03-24 VITALS — BP 110/68 | HR 75 | Temp 98.2°F | Resp 18 | Ht 67.0 in | Wt 191.2 lb

## 2023-03-24 DIAGNOSIS — I10 Essential (primary) hypertension: Secondary | ICD-10-CM | POA: Diagnosis not present

## 2023-03-24 DIAGNOSIS — G629 Polyneuropathy, unspecified: Secondary | ICD-10-CM | POA: Insufficient documentation

## 2023-03-24 DIAGNOSIS — C186 Malignant neoplasm of descending colon: Secondary | ICD-10-CM

## 2023-03-24 DIAGNOSIS — J449 Chronic obstructive pulmonary disease, unspecified: Secondary | ICD-10-CM | POA: Diagnosis not present

## 2023-03-24 DIAGNOSIS — C187 Malignant neoplasm of sigmoid colon: Secondary | ICD-10-CM | POA: Diagnosis not present

## 2023-03-24 DIAGNOSIS — E538 Deficiency of other specified B group vitamins: Secondary | ICD-10-CM | POA: Diagnosis not present

## 2023-03-24 DIAGNOSIS — R911 Solitary pulmonary nodule: Secondary | ICD-10-CM | POA: Diagnosis not present

## 2023-03-24 DIAGNOSIS — K2270 Barrett's esophagus without dysplasia: Secondary | ICD-10-CM | POA: Insufficient documentation

## 2023-03-24 LAB — CEA (ACCESS): CEA (CHCC): 1.67 ng/mL (ref 0.00–5.00)

## 2023-03-24 NOTE — Telephone Encounter (Signed)
-----   Message from [name] sent at 03/24/2023 10:42 AM EST ----- Please call patient, the CEA is normal, follow-up as scheduled

## 2023-03-24 NOTE — Telephone Encounter (Signed)
Patient gave verbal understanding and had no further question or concern. 

## 2023-03-24 NOTE — Telephone Encounter (Signed)
Called patient to inform of CT for 03-28-23- arrival time- 11:15 am @ [facility], no restrictions to scan, patient to receive results from Ashlyn Bruning on 04-02-23 @ 11 am via telephone, spoke with patient and he is aware of these appts. and the instructions

## 2023-03-24 NOTE — Progress Notes (Signed)
[facility] OFFICE PROGRESS NOTE   Diagnosis: Colon cancer  INTERVAL HISTORY:   Glen [name] returns as scheduled.  Good appetite.  He has frequent bowel movements.  The bowel movements are then.  No bleeding.  No other complaint.  Objective:  Vital signs in last 24 hours:  Blood pressure 110/68, pulse 75, temperature 98.2 F (36.8 C), temperature source Temporal, resp. rate 18, height 5\' 7"  (1.702 m), weight 191 lb 3.2 oz (86.7 kg), SpO2 95%.   Lymphatics: No cervical, supraclavicular, axillary, or inguinal nodes Resp: Lungs with distant breath sounds, no respiratory distress Cardio: Regular rate and rhythm GI: No hepatosplenomegaly, no mass Vascular: No leg edema   Lab Results:  Lab Results  Component Value Date   WBC 5.4 07/25/2018   HGB 8.9 (L) 07/25/2018   HCT 27.7 (L) 07/25/2018   MCV 93.3 07/25/2018   PLT 245 07/25/2018   NEUTROABS 5.3 07/17/2018    CMP  Lab Results  Component Value Date   NA 137 09/11/2021   K 4.5 09/11/2021   CL 105 09/11/2021   CO2 26 09/11/2021   GLUCOSE 121 (H) 09/11/2021   BUN 21 09/11/2021   CREATININE 1.10 12/07/2021   CALCIUM 9.5 09/11/2021   PROT 6.0 (L) 07/17/2018   ALBUMIN 3.4 (L) 07/17/2018   AST 19 07/17/2018   ALT 14 07/17/2018   ALKPHOS 85 07/17/2018   BILITOT 0.6 07/17/2018   GFRNONAA >60 09/11/2021   GFRAA >60 07/25/2018    Lab Results  Component Value Date   CEA1 1.87 09/14/2020   CEA 1.76 09/20/2022     Medications: I have reviewed the patient's current medications.   Assessment/Plan:  Colon cancer  Colonoscopy by Dr. Randa Evens 06/22/2018-polypoid completely obstructing large mass was found in the proximal sigmoid colon.  Pathology showed fragments of ulcer and inflammatory debris; no viable tissue present.   CT abdomen/pelvis 06/30/2018-severe descending and sigmoid diverticulosis.  There was a focal dilatation of the mid descending colon to approximately 8.5 x 6.2 cm.  There was no overt  exophytic mass or mesenteric lymphadenopathy.  No evidence of abdominal metastatic disease.   07/21/2018 open left colectomy with primary anastomosis, primary repair of umbilical hernia by Dr. Derrell Lolling.  Pathology showed invasive moderately differentiated adenocarcinoma with mucinous features measuring 7.5 cm.  There was circumferential involvement of the descending colon with luminal obstruction.  Carcinoma focally invaded into the pericolonic soft tissue.  Margins negative.  No lymphovascular or perineural invasion.  21 lymph nodes negative for cancer; loss of expression MLH1 and PMS2; MSI high.  MLH1 hyper methylation present Colonoscopy 05/27/2019-polyps removed from the sigmoid, descending, cecum, ascending, and transverse colon, nodular mucosa at the colonic anastomosis biopsied-benign ulcerated mucosa, polyps were tubular adenomas, mucosal prolapse polyps,  and a hyperplastic polyp Upper endoscopy 06/22/2018-esophageal mucosal changes suggestive of long segment Barrett's esophagus.  A few gastric polyps.  Normal examined duodenum.  GERD.  Stomach biopsy-fundic gland polyp.  Negative for dysplasia.  Esophagus biopsy-Barrett's mucosa.  Negative for dysplasia.   COPD Hypertension B12 deficiency Peripheral neuropathy Right lung nodule CT chest 07/31/2021-increased size of a right upper lobe nodule compared to December 2022 PET 08/29/2021-hypermetabolic right upper lobe nodule, no adenopathy or evidence of distant metastatic disease Bronchoscopy biopsy of right upper lobe nodule 09/11/2021-brushing with atypical large cells suspicious for tumor SBRT to right upper lung lesion 10/10/2021 - 10/17/2018 23-54 Gray in 3 fractions CT chest 11/29/2021-decrease size of right upper lobe nodule, no new nodules CT chest 03/14/2022-development  of groundglass and consolidative airspace opacity in the right upper lobe about a nodule with adjacent biopsy clip, nodule unchanged in size, new subpleural nodule in the medial right  lower lobe-nonspecific     Disposition: Glen [name] is in clinical remission from colon cancer.  We will follow-up on the CEA from today.  He will return for an office visit in 6 months.  His weight is down 10 pounds compared to when he was here in May, but is similar to 2 years ago.  He reports good appetite.  We will contact Dr. Ewing Schlein to see if he plans a surveillance colonoscopy.  [name], MD  03/24/2023  10:13 AM

## 2023-03-26 ENCOUNTER — Other Ambulatory Visit: Payer: Self-pay

## 2023-03-26 DIAGNOSIS — I6523 Occlusion and stenosis of bilateral carotid arteries: Secondary | ICD-10-CM

## 2023-03-28 ENCOUNTER — Ambulatory Visit (HOSPITAL_COMMUNITY)
Admission: RE | Admit: 2023-03-28 | Discharge: 2023-03-28 | Disposition: A | Discharge status: Home / Self Care | Payer: Medicare PPO | Source: Ambulatory Visit | Attending: Urology | Admitting: Urology

## 2023-03-28 DIAGNOSIS — C3411 Malignant neoplasm of upper lobe, right bronchus or lung: Secondary | ICD-10-CM | POA: Diagnosis not present

## 2023-03-28 DIAGNOSIS — I700 Atherosclerosis of aorta: Secondary | ICD-10-CM | POA: Diagnosis not present

## 2023-03-28 DIAGNOSIS — J439 Emphysema, unspecified: Secondary | ICD-10-CM | POA: Diagnosis not present

## 2023-03-28 DIAGNOSIS — C3490 Malignant neoplasm of unspecified part of unspecified bronchus or lung: Secondary | ICD-10-CM | POA: Diagnosis not present

## 2023-03-28 MED ORDER — IOHEXOL 300 MG/ML  SOLN
75.0000 mL | Freq: Once | INTRAMUSCULAR | Status: AC | PRN
  Administered 2023-03-28: 75 mL via INTRAVENOUS

## 2023-04-01 NOTE — Progress Notes (Signed)
Radiation Oncology         [phone] ________________________________  Name: Glen [name] MRN: [id number]  Date: 04/02/2023  DOB: 1940/07/14  Post Treatment Note  CC: Darrow Bussing, MD  Darrow Bussing, MD  Diagnosis:    82 year old male putative, Stage IA, NSCLC in the RUL lung  Interval Since Last Radiation:  11 months  10/10/21 - 10/16/21: SBRT//  The target in the RUL lung was treated to 54 Gy in 3 fractions of 18 Gy  Narrative:  I spoke with the patient to conduct his routine scheduled follow up visit via telephone to review his recent post-treatment CT Chest results to spare the patient unnecessary potential exposure in the healthcare setting during the current COVID-19 pandemic.  The patient was notified in advance and gave permission to proceed with this visit format.  He tolerated radiation treatment relatively well without any ill side effects aside from mild fatigue and mild dysphagia.                              On review of systems, the patient states that he is doing well in general.  He reports that the dysphagia has resolved completely and his energy level is gradually improving.  He has persistent, shortness of breath with exertion and a non-productive cough (clear/white sputum in am) but denies chest pain, fever, chills or night sweats. The shortness of breath is not worsening at this point. He has continued using his inhalers for COPD and has noted some improvement in his breathing since switching to [place] and he feels well otherwise.  His post-treatment Chest CT from 03/14/22 showed findings consistent with developing radiation pneumonitis and fibrosis but the treated nodule in the RUL appeared stable.  There was a new subpleural nodule of the medial right lower lobe measuring 1.4 x 0.8 cm that was nonspecific and felt most likely infectious or inflammatory, particularly in light of the rapid development as well as additional new more clearly infectious/inflammatory  nodularity seen in the left lung base.  Therefore, we tried a course of antibiotics and prednisone which he completed as prescribed and has not noticed significant improvement in his breathing although the productive cough has resolved. We obtained a short interval follow up with repeat CT Chest on 05/28/22 and this showed the post radiation fibrosis in the right upper lobe was similar to that seen on the prior scan although it had become somewhat more confluent inferiorly with more prominent volume loss. The 9 x 4 mm right upper lobe pulmonary nodule measured previously was no longer discretely evident in the background scarring.  There was interval resolution of the right lower lobe subpleural nodule and clustered tree-in-bud nodularity seen previously in the left lower lobe with no new suspicious pulmonary nodule or mass.  Follow up CT Chest on 09/23/22 showed a stable appearance without evidence of local recurrence or metastatic disease and stable post radiation changes in the right upper lobe lung.    His most recent follow up CT Chest from 03/28/23 continues to show a stable appearance without evidence of local recurrence or metastatic disease and stable post radiation changes in the right upper lobe lung. We reviewed these results today by telephone.    ALLERGIES:  is allergic to aspirin, lisinopril, nsaids, and other.  Meds: Current Outpatient Medications  Medication Sig Dispense Refill   acetaminophen (TYLENOL) 650 MG CR tablet Take 1,950 mg by mouth at bedtime.  albuterol (PROAIR HFA) 108 (90 Base) MCG/ACT inhaler Inhale 2 puffs into the lungs every 6 (six) hours as needed for wheezing or shortness of breath. Inhale 2 puffs every 6 hours as needed 17 each 5   ALPRAZolam (XANAX) 0.25 MG tablet Take 0.25 mg by mouth daily as needed for anxiety.      amLODipine (NORVASC) 10 MG tablet Take 10 mg by mouth daily.     aspirin EC 81 MG tablet 1 tablet     atorvastatin (LIPITOR) 40 MG tablet Take  40 mg by mouth at bedtime.      Budeson-Glycopyrrol-Formoterol (BREZTRI AEROSPHERE) 160-9-4.8 MCG/ACT AERO INHALE 2 PUFFS INTO THE LUNGS IN THE MORNING AND AT BEDTIME 10.7 g 11   Cyanocobalamin (VITAMIN B12) 1000 MCG TBCR 1 tablet     diphenhydrAMINE (BENADRYL) 25 MG tablet Take 50 mg by mouth at bedtime.     ezetimibe (ZETIA) 10 MG tablet TAKE 1 TABLET(10 MG) BY MOUTH DAILY 90 tablet 2   ferrous sulfate 325 (65 FE) MG tablet Take 325 mg by mouth daily. (Patient not taking: Reported on 03/24/2023)     fluticasone (FLONASE) 50 MCG/ACT nasal spray Place 2 sprays into both nostrils daily as needed for allergies.     gabapentin (NEURONTIN) 100 MG capsule Take 1 capsule (100 mg total) by mouth in the morning. 90 capsule 0   gabapentin (NEURONTIN) 300 MG capsule TAKE 2 CAPSULES(600 MG) BY MOUTH AT BEDTIME 180 capsule 3   hydrochlorothiazide (MICROZIDE) 12.5 MG capsule Take 12.5 mg by mouth daily.      levalbuterol (XOPENEX) 0.63 MG/3ML nebulizer solution Use 1 vial via nebulizer every 4 hours as needed for wheezing or shortness of breath. (Patient not taking: Reported on 03/24/2023) 425 mL 3   omeprazole (PRILOSEC) 20 MG capsule Take 20 mg by mouth daily.     Polyethylene Glycol 3350 (MIRALAX PO) Take 17 g by mouth daily.     primidone (MYSOLINE) 50 MG tablet TAKE 2 TABLETS BY MOUTH IN THE MORNING AND 1 TABLET IN THE EVENING 270 tablet 0   sertraline (ZOLOFT) 100 MG tablet Take 100 mg by mouth daily.   4   telmisartan (MICARDIS) 40 MG tablet Take 40 mg by mouth daily.     No current facility-administered medications for this visit.    Physical Findings:  vitals were not taken for this visit.   /10 Unable to assess due to telephone follow-up visit format.  Lab Findings: Lab Results  Component Value Date   WBC 5.4 07/25/2018   HGB 8.9 (L) 07/25/2018   HCT 27.7 (L) 07/25/2018   MCV 93.3 07/25/2018   PLT 245 07/25/2018     Radiographic Findings: CT CHEST W CONTRAST  Result Date:  04/01/2023 CLINICAL DATA:  Monitoring primary right upper lobe non-small-cell lung cancer. * Tracking Code: BO * EXAM: CT CHEST WITH CONTRAST TECHNIQUE: Multidetector CT imaging of the chest was performed during intravenous contrast administration. RADIATION DOSE REDUCTION: This exam was performed according to the departmental dose-optimization program which includes automated exposure control, adjustment of the mA and/or kV according to patient size and/or use of iterative reconstruction technique. CONTRAST:  75mL OMNIPAQUE IOHEXOL 300 MG/ML  SOLN COMPARISON:  Chest CT 09/23/2022 and older. FINDINGS: Cardiovascular: Heart is nonenlarged. Trace pericardial fluid. Coronary artery calcifications are seen. The thoracic aorta has a normal course and caliber with atherosclerotic calcified plaque. There is also calcified plaque along the great vessels particularly the subclavian vessels. Mediastinum/Nodes: No specific abnormal lymph node  enlargement identified in the axillary regions, hilum or mediastinum. Normal caliber thoracic esophagus. Preserved thyroid gland. Lungs/Pleura: Centrilobular emphysematous lung changes identified. No pneumothorax, effusion or edema. Once again there is bandlike opacity right midlung with a fiduciary marker, inferior aspect of the right upper lobe. This has similar to previous examination. Persistent separate reticulonodular changes in the posterior right upper lobe inferiorly as well. No new dominant mass. Stable 3 mm right upper lobe nodule on series 6, image 27. Upper Abdomen: Along the upper abdomen the adrenal glands are preserved. Fatty liver infiltration. Musculoskeletal: Moderate degenerative changes of the spine. Multilevel bridging syndesmophytes and osteophytes. IMPRESSION: No significant interval change. Stable posttreatment changes in the right upper lobe with a fiduciary marker and bandlike areas of presumed scarring and fibrotic change. Stable few tiny lung nodules  elsewhere. No developing new mass lesion, fluid collection or lymph node enlargement. Aortic Atherosclerosis (ICD10-I70.0) and Emphysema (ICD10-J43.9). Electronically Signed   By: Karen Kays M.D.   On: 04/01/2023 12:11   VAS US CAROTID  Result Date: 03/07/2023 Carotid Arterial Duplex Study Patient Name:  Glen [name]  Date of Exam:   03/07/2023 Medical Rec #: [id number]          Accession #:    [id number] Date of Birth: 01/08/1941          Patient Gender: M Patient Age:   58 years Exam Location:  [facility] Procedure:      VAS US CAROTID Referring Phys: Cristal Deer DICKSON --------------------------------------------------------------------------------  Indications:       Carotid artery disease. Risk Factors:      Hypertension, coronary artery disease. Comparison Study:  07/25/22 prior Performing Technologist: [place] Ponder RVS  Examination Guidelines: A complete evaluation includes B-mode imaging, spectral Doppler, color Doppler, and power Doppler as needed of all accessible portions of each vessel. Bilateral testing is considered an integral part of a complete examination. Limited examinations for reoccurring indications may be performed as noted.  Right Carotid Findings: +----------+--------+--------+--------+-------------------------+---------+           PSV cm/sEDV cm/sStenosisPlaque Description       Comments  +----------+--------+--------+--------+-------------------------+---------+ CCA Prox  93                      heterogenous                       +----------+--------+--------+--------+-------------------------+---------+ CCA Distal86      11              heterogenous                       +----------+--------+--------+--------+-------------------------+---------+ ICA Prox  349     66      60-79%  heterogenous and calcificShadowing +----------+--------+--------+--------+-------------------------+---------+ ICA Mid   226     43                                                  +----------+--------+--------+--------+-------------------------+---------+ ICA Distal75      15                                                 +----------+--------+--------+--------+-------------------------+---------+ ECA  447             >50%                                       +----------+--------+--------+--------+-------------------------+---------+ +----------+--------+-------+--------+-------------------+           PSV cm/sEDV cmsDescribeArm Pressure (mmHG) +----------+--------+-------+--------+-------------------+ [id number]                                        +----------+--------+-------+--------+-------------------+ +---------+--------+--+--------+--+---------+ VertebralPSV cm/s68EDV cm/s12Antegrade +---------+--------+--+--------+--+---------+  Left Carotid Findings: +----------+--------+--------+--------+-------------------------+--------+           PSV cm/sEDV cm/sStenosisPlaque Description       Comments +----------+--------+--------+--------+-------------------------+--------+ CCA Prox  116     18              heterogenous                      +----------+--------+--------+--------+-------------------------+--------+ CCA Distal132     22              heterogenous                      +----------+--------+--------+--------+-------------------------+--------+ ICA Prox  133     28      1-39%   heterogenous and calcific         +----------+--------+--------+--------+-------------------------+--------+ ICA Mid   95      19                                                +----------+--------+--------+--------+-------------------------+--------+ ICA Distal60      15                                                +----------+--------+--------+--------+-------------------------+--------+ ECA       391             >50%                                       +----------+--------+--------+--------+-------------------------+--------+ +----------+--------+--------+--------+-------------------+           PSV cm/sEDV cm/sDescribeArm Pressure (mmHG) +----------+--------+--------+--------+-------------------+ Subclavian115                                         +----------+--------+--------+--------+-------------------+ +---------+--------+--+--------+--+---------+ VertebralPSV cm/s61EDV cm/s12Antegrade +---------+--------+--+--------+--+---------+   Summary: Right Carotid: Velocities in the right ICA are consistent with a 60-79%                stenosis. The ECA appears >50% stenosed. Left Carotid: Velocities in the left ICA are consistent with a 1-39% stenosis.               The ECA appears >50% stenosed. Vertebrals: Bilateral vertebral arteries demonstrate antegrade flow. *See table(s) above for measurements and observations.  Electronically signed by Carolynn Sayers on 03/07/2023 at 10:18:50 AM.    Final  Impression/Plan: 50.  82 year old male putative, Stage IA, NSCLC in the RUL lung. He appears to have recovered well from the effects of his recent stereotactic body radiotherapy (SBRT) and has noticed some improvement in his breathing with the use of his new inhalers.  His recent follow up CT Chest shows a stable appearance without evidence of local recurrence or disease progression.  Therefore, we will continue with serial CT Chest scans every 6 months going forward, to continue to monitor for any evidence of disease recurrence or progression and will follow-up by phone to review the results and any recommendations following each scan.  Once we reach 5 years without evidence of disease recurrence, we can then moved to annual chest CT scans for surveillance.  He appears to have a good understanding of these recommendations and is comfortable and in agreement with the stated plan.  He knows that he is welcome to call anytime in the interim with any  questions or concerns related to his radiation.  He will continue follow up with Dr. Tonia Brooms in pulmonology for management of his COPD and will also continue in routine follow-up under the care and direction of Dr. Truett Perna for continued surveillance of his colon cancer which is in remission.   I personally spent 20 minutes in this encounter including chart review, reviewing radiological studies, meeting face-to-face with the patient, entering orders and completing documentation.     Marguarite Arbour, PA-C

## 2023-04-02 ENCOUNTER — Other Ambulatory Visit: Payer: Self-pay | Admitting: Urology

## 2023-04-02 ENCOUNTER — Other Ambulatory Visit: Payer: Self-pay | Admitting: Neurology

## 2023-04-02 ENCOUNTER — Ambulatory Visit
Admission: RE | Admit: 2023-04-02 | Discharge: 2023-04-02 | Disposition: A | Discharge status: Home / Self Care | Payer: Medicare PPO | Source: Ambulatory Visit | Attending: Urology | Admitting: Urology

## 2023-04-02 ENCOUNTER — Encounter: Payer: Self-pay | Admitting: Urology

## 2023-04-02 VITALS — Ht 67.0 in | Wt 184.0 lb

## 2023-04-02 DIAGNOSIS — Z87891 Personal history of nicotine dependence: Secondary | ICD-10-CM | POA: Diagnosis not present

## 2023-04-02 DIAGNOSIS — C3411 Malignant neoplasm of upper lobe, right bronchus or lung: Secondary | ICD-10-CM

## 2023-04-02 DIAGNOSIS — G250 Essential tremor: Secondary | ICD-10-CM

## 2023-04-02 NOTE — Progress Notes (Signed)
Telephone nursing appointment for review of most recent CT-Chest results. I verified patient's identity x2 and began nursing interview.   Patient reports doing well. Patient denies any other related issues at this time.   Meaningful use complete.   Patient aware of their 11am telephone appointment w/ Ashlyn Bruning PA-C. I left my extension [phone] in case patient needs anything. Patient verbalized understanding. This concludes the nursing interview.   Patient contact [phone]     Ruel Favors, LPN

## 2023-04-25 ENCOUNTER — Encounter: Payer: Self-pay | Admitting: Gastroenterology

## 2023-05-16 ENCOUNTER — Other Ambulatory Visit: Payer: Self-pay | Admitting: Cardiology

## 2023-05-16 DIAGNOSIS — I2510 Atherosclerotic heart disease of native coronary artery without angina pectoris: Secondary | ICD-10-CM

## 2023-05-28 ENCOUNTER — Other Ambulatory Visit: Payer: Self-pay | Admitting: Neurology

## 2023-05-28 DIAGNOSIS — G250 Essential tremor: Secondary | ICD-10-CM

## 2023-06-06 ENCOUNTER — Ambulatory Visit: Payer: Medicare PPO | Admitting: Nurse Practitioner

## 2023-06-06 ENCOUNTER — Encounter: Payer: Self-pay | Admitting: Nurse Practitioner

## 2023-06-06 VITALS — BP 136/76 | HR 77 | Temp 98.4°F | Ht 67.0 in | Wt 196.2 lb

## 2023-06-06 DIAGNOSIS — C3411 Malignant neoplasm of upper lobe, right bronchus or lung: Secondary | ICD-10-CM | POA: Diagnosis not present

## 2023-06-06 DIAGNOSIS — J449 Chronic obstructive pulmonary disease, unspecified: Secondary | ICD-10-CM | POA: Diagnosis not present

## 2023-06-06 DIAGNOSIS — G4733 Obstructive sleep apnea (adult) (pediatric): Secondary | ICD-10-CM | POA: Diagnosis not present

## 2023-06-06 NOTE — Assessment & Plan Note (Signed)
Compensated on current regimen with moderate symptom burden. No recent exacerbations/hospitalizations. Some chronic bronchitis symptoms. Advised him to increase mucinex to twice daily. Could consider inhaler phosphodiesterase inhibitor with [place] if symptoms persist. Encouraged to work on graded exercises. Action plan in place.   Patient Instructions  Continue Breztri 2 puffs Twice daily. Brush tongue and rinse mouth afterwards Continue Albuterol inhaler 2 puffs or 3 mL neb every 6 hours as needed for shortness of breath or wheezing. Notify if symptoms persist despite rescue inhaler/neb use. Use your neb twice a day before your Breztri  Continue flonase nasal spray 2 sprays each nostril daily Use guaifenesin 600 mg Twice daily for cough/congestion/thick phlegm    Continue to use CPAP every night, minimum of 4-6 hours a night.  Be aware of reduced alertness and do not drive or operate heavy machinery if experiencing this or drowsiness.   Orders placed for new CPAP machine. Call me if you haven't heard something in 2-3 weeks about this    Follow up with oncology as scheduled    Follow up in 10-12 weeks with Dr. Maple Hudson or Philis Nettle. If symptoms worsen, please contact office for sooner follow up or seek emergency care.

## 2023-06-06 NOTE — Progress Notes (Signed)
@Patient  ID: Glen [name], male    DOB: 04-May-1941, 83 y.o.   MRN: [id number]  Chief Complaint  Patient presents with   Follow-up    Referring provider: Darrow Bussing, MD  HPI: 83 year old male, former smoker followed for lung nodule, COPD mixed type, and OSA on CPAP. He is a patient of Dr. Roxy Cedar and also followed by Dr. Tonia Brooms for pulmonary nodule. Past medical history significant for HTN, CAD, allergic rhinitis, HLD.   TEST/EVENTS:  02/26/2013 PFT: FVC 70, FEV1 46, ratio 52, TLC 93, DLCOunc 87 corrects to normal for alveolar volume  09/23/2022 CT chest wo contrast: atherosclerosis. Centrilobular emphysema. Chronic bandlike scarring in the RUL from previous radiation therapy, unchanged. Stable 3 mm RUL nodule, unchanged and considered benign. Chronic idiopathic skeletal hyperostosis.  03/28/2023 CT chest wo contrast: trace pericardial fluid. Centrilobular emphysematous changes. Bandlike opacity in right midlung, similar. Persistent reticulonodular changes in RUL. Post treatment changes. No new dominant mass. Stable 3 mm RUL nodule. Fatty liver.   06/04/2022: OV with Dr. Tonia Brooms. CT imaging 07/2021 with abnormal RUL nodule. PET scan revealed hypermetabolic uptake within the RUL with no evidence of a distant metastatic disease. Biopsy was not definitive. He was referred for empiric SBRT. 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 Using inhaler regularly. Does have daily congestion. Not using nebs regularly but does feel like they help. Stopped Trelegy. Started on [place]. Recommended using nebs in AM and PM.   12/03/2022: OV with Beth Goodlin NP for follow up. He was changed to [place] at his last visit. He does feel like this has helped opened up his chest more. He's not coughing as much and doesn't feel as congested. His breathing feels relatively the same. His only complaint is the fact he has to do it twice a day. He does use his nebs a few times a week but not daily. Not  interested in pulmonary rehab right now. Just finished PT for his knees, which he felt helped. His recent CT chest was stable with post treatment changes; no recurrence of disease. He denies any hemoptysis, weight loss, anorexia.  11/03/2022-12/02/2022: CPAP 5-20 cmH2O 30/30 days; 100% >4 hr; average use 9 hr 9 min Pressure 95th 12.4 Leaks 95th 26.6 AHI 1  06/06/2023: Today - follow up Patient presents today for follow up. He's had some life stressors and family events since he was here last. Unfortunately, his sister had a significant stroke last year. They found a malignant brain tumor after this. She has been undergoing treatment since. He is helping to care for her.  His breathing has been stable. Not having any issues right now. He hasn't had to have any steroids or antibiotics. Occasional minimal cough with clear phlegm. He does say that the mucus can be very thick at times. No wheezing or chest congestion. Uses his Breztri twice a day. Hasn't needed his rescue. Takes mucinex once a day  He does use his CPAP nightly. Receives benefit from use. Believes he's due for a new machine.    Allergies  Allergen Reactions   Aspirin Other (See Comments)    pt has bleeding ulcers   Lisinopril Cough   Nsaids     Other reaction(s): GI upset   Other     GI Upset (intolerance)  AVOID NSAIDS    Immunization History  Administered Date(s) Administered   Influenza Split 05/10/2003, 04/17/2009, 03/15/2010, 01/21/2011, 02/11/2012, 05/09/2012, 04/27/2013, 02/04/2014, 02/25/2018   Influenza Whole 03/14/2011   Influenza, High Dose Seasonal  PF 01/20/2019, 01/24/2020   Influenza,inj,Quad PF,6+ Mos 02/26/2013, 02/04/2014, 02/06/2015, 05/20/2016, 02/25/2018, 01/13/2019, 01/24/2020, 01/25/2021   Influenza-Unspecified 02/09/2022, 01/26/2023   PFIZER(Purple Top)SARS-COV-2 Vaccination 06/04/2019, 06/25/2019, 07/13/2019, 02/28/2020, 08/31/2020   Pfizer Covid-19 Vaccine Bivalent Booster 71yrs & up 05/18/2021,  06/13/2022   Pneumococcal Conjugate-13 12/21/2013   Pneumococcal Polysaccharide-23 05/22/2007   Td 11/26/2000   Tdap 11/09/2010, 01/25/2021   Zoster, Live 12/18/2016, 02/27/2017    Past Medical History:  Diagnosis Date   Allergic rhinitis, cause unspecified    Anxiety    Asthma    Cancer (HCC) 07/21/2018   colon   COPD (chronic obstructive pulmonary disease) (HCC)    Depression    Dyspnea    upon exertion   GERD (gastroesophageal reflux disease)    Hypertension    Pre-diabetes    PUD (peptic ulcer disease)    avoids aspirin   Sleep apnea    uses C-PAP    Tobacco History: Social History   Tobacco Use  Smoking Status Former   Current packs/day: 0.00   Types: Cigarettes   Quit date: 04/12/2011   Years since quitting: 12.1   Passive exposure: Never  Smokeless Tobacco Never   Counseling given: Not Answered   Outpatient Medications Prior to Visit  Medication Sig Dispense Refill   acetaminophen (TYLENOL) 650 MG CR tablet Take 1,950 mg by mouth at bedtime.     albuterol (PROAIR HFA) 108 (90 Base) MCG/ACT inhaler Inhale 2 puffs into the lungs every 6 (six) hours as needed for wheezing or shortness of breath. Inhale 2 puffs every 6 hours as needed 17 each 5   ALPRAZolam (XANAX) 0.25 MG tablet Take 0.25 mg by mouth daily as needed for anxiety.      amLODipine (NORVASC) 10 MG tablet Take 10 mg by mouth daily.     aspirin EC 81 MG tablet 1 tablet     atorvastatin (LIPITOR) 40 MG tablet Take 40 mg by mouth at bedtime.      Budeson-Glycopyrrol-Formoterol (BREZTRI AEROSPHERE) 160-9-4.8 MCG/ACT AERO INHALE 2 PUFFS INTO THE LUNGS IN THE MORNING AND AT BEDTIME 10.7 g 11   Cyanocobalamin (VITAMIN B12) 1000 MCG TBCR 1 tablet     diphenhydrAMINE (BENADRYL) 25 MG tablet Take 50 mg by mouth at bedtime.     ezetimibe (ZETIA) 10 MG tablet TAKE 1 TABLET(10 MG) BY MOUTH DAILY 90 tablet 0   ferrous sulfate 325 (65 FE) MG tablet Take 325 mg by mouth daily.     fluticasone (FLONASE) 50  MCG/ACT nasal spray Place 2 sprays into both nostrils daily as needed for allergies.     gabapentin (NEURONTIN) 300 MG capsule TAKE 2 CAPSULES(600 MG) BY MOUTH AT BEDTIME 180 capsule 3   hydrochlorothiazide (MICROZIDE) 12.5 MG capsule Take 12.5 mg by mouth daily.      levalbuterol (XOPENEX) 0.63 MG/3ML nebulizer solution Use 1 vial via nebulizer every 4 hours as needed for wheezing or shortness of breath. 425 mL 3   omeprazole (PRILOSEC) 20 MG capsule Take 20 mg by mouth daily.     Polyethylene Glycol 3350 (MIRALAX PO) Take 17 g by mouth daily.     primidone (MYSOLINE) 50 MG tablet TAKE 2 TABLETS BY MOUTH IN THE MORNING AND 1 TABLET IN THE EVENING 270 tablet 2   sertraline (ZOLOFT) 100 MG tablet Take 100 mg by mouth daily.   4   telmisartan (MICARDIS) 40 MG tablet Take 40 mg by mouth daily.     gabapentin (NEURONTIN) 100 MG capsule TAKE 1  CAPSULE(100 MG) BY MOUTH IN THE MORNING (Patient not taking: Reported on 06/06/2023) 90 capsule 0   No facility-administered medications prior to visit.     Review of Systems:   Constitutional: No weight loss or gain, night sweats, fevers, chills, fatigue, or lassitude. HEENT: No headaches, difficulty swallowing, tooth/dental problems, or sore throat. No sneezing, itching, ear ache, nasal congestion, or post nasal drip CV:  No chest pain, orthopnea, PND, swelling in lower extremities, anasarca, dizziness, palpitations, syncope Resp: +shortness of breath with exertion (baseline); occasional cough. No excess mucus or change in color of mucus. No hemoptysis. No wheezing.  No chest wall deformity GI:  No heartburn, indigestion, abdominal pain, nausea, vomiting, diarrhea, change in bowel habits, loss of appetite, bloody stools.  Skin: No rash, lesions, ulcerations MSK:  No increased joint pain or swelling.   Neuro: No dizziness or lightheadedness.  Psych: No depression or anxiety. Mood stable.     Physical Exam:  BP 136/76 (BP Location: Left Arm, Patient  Position: Sitting, Cuff Size: Large)   Pulse 77   Temp 98.4 F (36.9 C) (Oral)   Ht 5\' 7"  (1.702 m)   Wt 196 lb 3.2 oz (89 kg)   SpO2 97%   BMI 30.73 kg/m   GEN: Pleasant, interactive, well-kempt; obese; in no acute distress HEENT:  Normocephalic and atraumatic. PERRLA. Sclera white. Nasal turbinates pink, moist and patent bilaterally. No rhinorrhea present. Oropharynx pink and moist, without exudate or edema. No lesions, ulcerations, or postnasal drip.  NECK:  Supple w/ fair ROM. No JVD present. Normal carotid impulses w/o bruits. Thyroid symmetrical with no goiter or nodules palpated. No lymphadenopathy.   CV: RRR, no m/r/g, no peripheral edema. Pulses intact, +2 bilaterally. No cyanosis, pallor or clubbing. PULMONARY:  Unlabored, regular breathing. Clear bilaterally A&P w/o wheezes/rales/rhonchi. No accessory muscle use.  GI: BS present and normoactive. Soft, non-tender to palpation. No organomegaly or masses detected.  MSK: No erythema, warmth or tenderness. Cap refil <2 sec all extrem. No deformities or joint swelling noted.  Neuro: A/Ox3. No focal deficits noted.   Skin: Warm, no lesions or rashe Psych: Normal affect and behavior. Judgement and thought content appropriate.     Lab Results:  CBC    Component Value Date/Time   WBC 5.4 07/25/2018 0330   RBC 2.97 (L) 07/25/2018 0330   HGB 8.9 (L) 07/25/2018 0330   HCT 27.7 (L) 07/25/2018 0330   PLT 245 07/25/2018 0330   MCV 93.3 07/25/2018 0330   MCH 30.0 07/25/2018 0330   MCHC 32.1 07/25/2018 0330   RDW 14.3 07/25/2018 0330   LYMPHSABS 1.2 07/17/2018 0929   MONOABS 0.6 07/17/2018 0929   EOSABS 0.3 07/17/2018 0929   BASOSABS 0.1 07/17/2018 0929    BMET    Component Value Date/Time   NA 137 09/11/2021 1143   K 4.5 09/11/2021 1143   CL 105 09/11/2021 1143   CO2 26 09/11/2021 1143   GLUCOSE 121 (H) 09/11/2021 1143   BUN 21 09/11/2021 1143   CREATININE 1.10 12/07/2021 0835   CALCIUM 9.5 09/11/2021 1143   GFRNONAA  >60 09/11/2021 1143   GFRAA >60 07/25/2018 0330    BNP No results found for: "BNP"   Imaging:  No results found.  Administration History     None           No data to display          No results found for: "NITRICOXIDE"      Assessment & Plan:  COPD mixed type (HCC) Compensated on current regimen with moderate symptom burden. No recent exacerbations/hospitalizations. Some chronic bronchitis symptoms. Advised him to increase mucinex to twice daily. Could consider inhaler phosphodiesterase inhibitor with [place] if symptoms persist. Encouraged to work on graded exercises. Action plan in place.   Patient Instructions  Continue Breztri 2 puffs Twice daily. Brush tongue and rinse mouth afterwards Continue Albuterol inhaler 2 puffs or 3 mL neb every 6 hours as needed for shortness of breath or wheezing. Notify if symptoms persist despite rescue inhaler/neb use. Use your neb twice a day before your Breztri  Continue flonase nasal spray 2 sprays each nostril daily Use guaifenesin 600 mg Twice daily for cough/congestion/thick phlegm    Continue to use CPAP every night, minimum of 4-6 hours a night.  Be aware of reduced alertness and do not drive or operate heavy machinery if experiencing this or drowsiness.   Orders placed for new CPAP machine. Call me if you haven't heard something in 2-3 weeks about this    Follow up with oncology as scheduled    Follow up in 10-12 weeks with Dr. Maple Hudson or Philis Nettle. If symptoms worsen, please contact office for sooner follow up or seek emergency care.    Putative stage I cancer of right upper lobe of lung (HCC) Stable CT November 2024. Follow up with radiation oncology as scheduled.   Obstructive sleep apnea OSA on CPAP. Receives benefit from use. Needs a new machine. Orders placed today. Aware of proper care/use. Healthy weight loss encouraged. Safe driving practices reviewed.    I spent 32 minutes of dedicated to the care  of this patient on the date of this encounter to include pre-visit review of records, face-to-face time with the patient discussing conditions above, post visit ordering of testing, clinical documentation with the electronic health record, making appropriate referrals as documented, and communicating necessary findings to members of the patients care team.  Noemi Chapel, NP 06/06/2023  Pt aware and understands NP's role.

## 2023-06-06 NOTE — Patient Instructions (Addendum)
Continue Breztri 2 puffs Twice daily. Brush tongue and rinse mouth afterwards Continue Albuterol inhaler 2 puffs or 3 mL neb every 6 hours as needed for shortness of breath or wheezing. Notify if symptoms persist despite rescue inhaler/neb use. Use your neb twice a day before your Breztri  Continue flonase nasal spray 2 sprays each nostril daily Use guaifenesin 600 mg Twice daily for cough/congestion/thick phlegm    Continue to use CPAP every night, minimum of 4-6 hours a night.  Be aware of reduced alertness and do not drive or operate heavy machinery if experiencing this or drowsiness.   Orders placed for new CPAP machine. Call me if you haven't heard something in 2-3 weeks about this    Follow up with oncology as scheduled    Follow up in 10-12 weeks with Dr. Maple Hudson or Philis Nettle. If symptoms worsen, please contact office for sooner follow up or seek emergency care.

## 2023-06-06 NOTE — Assessment & Plan Note (Signed)
Stable CT November 2024. Follow up with radiation oncology as scheduled.

## 2023-06-06 NOTE — Assessment & Plan Note (Signed)
OSA on CPAP. Receives benefit from use. Needs a new machine. Orders placed today. Aware of proper care/use. Healthy weight loss encouraged. Safe driving practices reviewed.

## 2023-07-04 DIAGNOSIS — E611 Iron deficiency: Secondary | ICD-10-CM | POA: Diagnosis not present

## 2023-07-04 DIAGNOSIS — K2270 Barrett's esophagus without dysplasia: Secondary | ICD-10-CM | POA: Diagnosis not present

## 2023-07-04 DIAGNOSIS — Z85038 Personal history of other malignant neoplasm of large intestine: Secondary | ICD-10-CM | POA: Diagnosis not present

## 2023-07-09 ENCOUNTER — Ambulatory Visit: Payer: Medicare PPO | Admitting: Urology

## 2023-07-31 DIAGNOSIS — G4733 Obstructive sleep apnea (adult) (pediatric): Secondary | ICD-10-CM | POA: Diagnosis not present

## 2023-08-13 DIAGNOSIS — K2270 Barrett's esophagus without dysplasia: Secondary | ICD-10-CM | POA: Diagnosis not present

## 2023-08-13 DIAGNOSIS — Z860100 Personal history of colon polyps, unspecified: Secondary | ICD-10-CM | POA: Diagnosis not present

## 2023-08-13 DIAGNOSIS — Z09 Encounter for follow-up examination after completed treatment for conditions other than malignant neoplasm: Secondary | ICD-10-CM | POA: Diagnosis not present

## 2023-08-13 DIAGNOSIS — Z13810 Encounter for screening for upper gastrointestinal disorder: Secondary | ICD-10-CM | POA: Diagnosis not present

## 2023-08-13 DIAGNOSIS — Z85038 Personal history of other malignant neoplasm of large intestine: Secondary | ICD-10-CM | POA: Diagnosis not present

## 2023-08-13 DIAGNOSIS — Z980 Intestinal bypass and anastomosis status: Secondary | ICD-10-CM | POA: Diagnosis not present

## 2023-08-13 DIAGNOSIS — K449 Diaphragmatic hernia without obstruction or gangrene: Secondary | ICD-10-CM | POA: Diagnosis not present

## 2023-08-13 DIAGNOSIS — K5730 Diverticulosis of large intestine without perforation or abscess without bleeding: Secondary | ICD-10-CM | POA: Diagnosis not present

## 2023-08-13 DIAGNOSIS — K2970 Gastritis, unspecified, without bleeding: Secondary | ICD-10-CM | POA: Diagnosis not present

## 2023-08-15 DIAGNOSIS — E7800 Pure hypercholesterolemia, unspecified: Secondary | ICD-10-CM | POA: Diagnosis not present

## 2023-08-15 DIAGNOSIS — I10 Essential (primary) hypertension: Secondary | ICD-10-CM | POA: Diagnosis not present

## 2023-08-15 DIAGNOSIS — J701 Chronic and other pulmonary manifestations due to radiation: Secondary | ICD-10-CM | POA: Diagnosis not present

## 2023-08-15 DIAGNOSIS — K2270 Barrett's esophagus without dysplasia: Secondary | ICD-10-CM | POA: Diagnosis not present

## 2023-08-15 DIAGNOSIS — J449 Chronic obstructive pulmonary disease, unspecified: Secondary | ICD-10-CM | POA: Diagnosis not present

## 2023-08-15 DIAGNOSIS — Z79899 Other long term (current) drug therapy: Secondary | ICD-10-CM | POA: Diagnosis not present

## 2023-08-15 DIAGNOSIS — R7303 Prediabetes: Secondary | ICD-10-CM | POA: Diagnosis not present

## 2023-08-15 DIAGNOSIS — E611 Iron deficiency: Secondary | ICD-10-CM | POA: Diagnosis not present

## 2023-08-15 DIAGNOSIS — Z85038 Personal history of other malignant neoplasm of large intestine: Secondary | ICD-10-CM | POA: Diagnosis not present

## 2023-08-18 ENCOUNTER — Other Ambulatory Visit: Payer: Self-pay | Admitting: Cardiology

## 2023-08-18 DIAGNOSIS — I2510 Atherosclerotic heart disease of native coronary artery without angina pectoris: Secondary | ICD-10-CM

## 2023-08-18 DIAGNOSIS — K2270 Barrett's esophagus without dysplasia: Secondary | ICD-10-CM | POA: Diagnosis not present

## 2023-08-29 ENCOUNTER — Ambulatory Visit: Payer: Medicare PPO | Admitting: Physician Assistant

## 2023-08-29 ENCOUNTER — Ambulatory Visit (HOSPITAL_COMMUNITY)
Admission: RE | Admit: 2023-08-29 | Discharge: 2023-08-29 | Disposition: A | Discharge status: Home / Self Care | Payer: Medicare PPO | Source: Ambulatory Visit | Attending: Vascular Surgery | Admitting: Vascular Surgery

## 2023-08-29 VITALS — BP 119/70 | HR 66 | Temp 98.1°F | Ht 67.0 in | Wt 191.9 lb

## 2023-08-29 DIAGNOSIS — I6523 Occlusion and stenosis of bilateral carotid arteries: Secondary | ICD-10-CM | POA: Insufficient documentation

## 2023-08-29 NOTE — Progress Notes (Signed)
 Office Note   History of Present Illness   Glen [name] is a 83 y.o. (Nov 29, 1940) male who presents for surveillance of carotid artery stenosis. He has no prior history of stroke or TIA. He has right ICA 60-79% stenosis and left ICA 1-39% stenosis.   The patient returns today for follow up. He denies any recent strokelike symptoms such as slurred speech, facial droop, sudden visual changes, or sudden weakness/numbness.  He tries to stay active but his mobility is limited by COPD.   Unfortunately his sister is dying of brain cancer. His main focus recently has been spending time with her while she is in hospice.   Current Outpatient Medications  Medication Sig Dispense Refill   acetaminophen  (TYLENOL ) 650 MG CR tablet Take 1,950 mg by mouth at bedtime.     albuterol  (PROAIR  HFA) 108 (90 Base) MCG/ACT inhaler Inhale 2 puffs into the lungs every 6 (six) hours as needed for wheezing or shortness of breath. Inhale 2 puffs every 6 hours as needed 17 each 5   ALPRAZolam  (XANAX ) 0.25 MG tablet Take 0.25 mg by mouth daily as needed for anxiety.      amLODipine  (NORVASC ) 10 MG tablet Take 10 mg by mouth daily.     aspirin EC 81 MG tablet 1 tablet     atorvastatin  (LIPITOR) 40 MG tablet Take 40 mg by mouth at bedtime.      Budeson-Glycopyrrol-Formoterol (BREZTRI  AEROSPHERE) 160-9-4.8 MCG/ACT AERO INHALE 2 PUFFS INTO THE LUNGS IN THE MORNING AND AT BEDTIME 10.7 g 11   cetirizine (ZYRTEC) 10 MG chewable tablet Chew 10 mg by mouth daily.     Cyanocobalamin (VITAMIN B12) 1000 MCG TBCR 1 tablet     ezetimibe  (ZETIA ) 10 MG tablet Take 1 tablet (10 mg total) by mouth daily. 30 tablet 0   ferrous sulfate 325 (65 FE) MG tablet Take 325 mg by mouth daily.     fluticasone  (FLONASE) 50 MCG/ACT nasal spray Place 2 sprays into both nostrils daily as needed for allergies.     gabapentin  (NEURONTIN ) 300 MG capsule TAKE 2 CAPSULES(600 MG) BY MOUTH AT BEDTIME 180 capsule 3   hydrochlorothiazide  (MICROZIDE )  12.5 MG capsule Take 12.5 mg by mouth daily.      levalbuterol  (XOPENEX ) 0.63 MG/3ML nebulizer solution Use 1 vial via nebulizer every 4 hours as needed for wheezing or shortness of breath. 425 mL 3   omeprazole  (PRILOSEC) 20 MG capsule Take 20 mg by mouth daily.     Polyethylene Glycol 3350  (MIRALAX PO) Take 17 g by mouth daily.     primidone  (MYSOLINE ) 50 MG tablet TAKE 2 TABLETS BY MOUTH IN THE MORNING AND 1 TABLET IN THE EVENING 270 tablet 2   sertraline  (ZOLOFT ) 100 MG tablet Take 100 mg by mouth daily.   4   telmisartan  (MICARDIS ) 40 MG tablet Take 40 mg by mouth daily.     No current facility-administered medications for this visit.    REVIEW OF SYSTEMS (negative unless checked):   Cardiac:  []  Chest pain or chest pressure? []  Shortness of breath upon activity? []  Shortness of breath when lying flat? []  Irregular heart rhythm?  Vascular:  []  Pain in calf, thigh, or hip brought on by walking? []  Pain in feet at night that wakes you up from your sleep? []  Blood clot in your veins? []  Leg swelling?  Pulmonary:  []  Oxygen at home? []  Productive cough? []  Wheezing?  Neurologic:  []  Sudden weakness in arms or  legs? []  Sudden numbness in arms or legs? []  Sudden onset of difficult speaking or slurred speech? []  Temporary loss of vision in one eye? []  Problems with dizziness?  Gastrointestinal:  []  Blood in stool? []  Vomited blood?  Genitourinary:  []  Burning when urinating? []  Blood in urine?  Psychiatric:  []  Major depression  Hematologic:  []  Bleeding problems? []  Problems with blood clotting?  Dermatologic:  []  Rashes or ulcers?  Constitutional:  []  Fever or chills?  Ear/Nose/Throat:  []  Change in hearing? []  Nose bleeds? []  Sore throat?  Musculoskeletal:  []  Back pain? []  Joint pain? []  Muscle pain?   Physical Examination   Vitals:   08/29/23 0929 08/29/23 0930  BP: 123/71 119/70  Pulse: 66   Temp: 98.1 F (36.7 C)   TempSrc: Temporal    SpO2: 94%   Weight: 191 lb 14.4 oz (87 kg)   Height: 5\' 7"  (1.702 m)    Body mass index is 30.06 kg/m.  General:  WDWN in NAD; vital signs documented above Gait: Not observed HENT: WNL, normocephalic Pulmonary: normal non-labored breathing  Cardiac: regular Abdomen: soft, NT, no masses Skin: without rashes Vascular Exam/Pulses: palpable radial pulses bilaterally Extremities: without ischemic changes, without gangrene , without cellulitis; without open wounds;  Musculoskeletal: no muscle wasting or atrophy  Neurologic: A&O X 3;  No focal weakness or paresthesias are detected Psychiatric:  The pt has Normal affect.  Non-Invasive Vascular Imaging   Bilateral Carotid Duplex (08/29/2023):  R ICA stenosis:  40-59% R VA:  patent and antegrade L ICA stenosis:  1-39% L VA:  patent and antegrade   Medical Decision Making   Glen [name] is a 83 y.o. male who presents for surveillance of carotid artery stenosis  Based on the patient's vascular studies, his carotid artery stenosis is unchanged bilaterally. His right ICA stenosis appears borderline between 40-59% and 60-79%. His left ICA stenosis is 1-39% He denies any strokelike symptoms such as slurred speech, facial droop, sudden visual changes, or sudden weakness/numbness.  He has no neurological deficits on exam. He has palpable and equal radial pulses bilaterally Recommend that he continue his daily aspirin and statin.  He can follow-up with our office in 6 months with repeat carotid duplex   Deneise Finlay PA-C [facility] Office: [phone]  Clinic MD: Susi Eric

## 2023-09-11 ENCOUNTER — Other Ambulatory Visit: Payer: Self-pay | Admitting: *Deleted

## 2023-09-11 DIAGNOSIS — I6523 Occlusion and stenosis of bilateral carotid arteries: Secondary | ICD-10-CM

## 2023-09-18 ENCOUNTER — Other Ambulatory Visit: Payer: Self-pay | Admitting: Cardiology

## 2023-09-18 DIAGNOSIS — I2510 Atherosclerotic heart disease of native coronary artery without angina pectoris: Secondary | ICD-10-CM

## 2023-09-22 ENCOUNTER — Other Ambulatory Visit: Payer: Medicare PPO

## 2023-09-22 ENCOUNTER — Ambulatory Visit: Payer: Medicare PPO | Admitting: Oncology

## 2023-09-22 ENCOUNTER — Other Ambulatory Visit: Payer: Self-pay

## 2023-09-23 ENCOUNTER — Telehealth: Payer: Self-pay | Admitting: *Deleted

## 2023-09-23 NOTE — Telephone Encounter (Signed)
 CALLED PATIENT TO INFORM OF CT FOR 09/30/23- ARRIVAL TIME- 4:45 PM @ [facility], NO RESTRICTIONS TO SCAN, PATIENT TO RECEIVE RESULTS FROM ASHLYN BRUNING ON 10-08-23 @ 10:30 AM VIA TELEPHONE, SPOKE WITH PATIENT AND HE IS AWARE OF THESE APPTS. AND THE INSTRUCTIONS

## 2023-09-30 ENCOUNTER — Ambulatory Visit (HOSPITAL_COMMUNITY)
Admission: RE | Admit: 2023-09-30 | Discharge: 2023-09-30 | Disposition: A | Discharge status: Home / Self Care | Source: Ambulatory Visit | Attending: Urology | Admitting: Urology

## 2023-09-30 DIAGNOSIS — C3411 Malignant neoplasm of upper lobe, right bronchus or lung: Secondary | ICD-10-CM | POA: Diagnosis not present

## 2023-09-30 DIAGNOSIS — I700 Atherosclerosis of aorta: Secondary | ICD-10-CM | POA: Diagnosis not present

## 2023-09-30 DIAGNOSIS — J432 Centrilobular emphysema: Secondary | ICD-10-CM | POA: Diagnosis not present

## 2023-09-30 MED ORDER — IOHEXOL 300 MG/ML  SOLN
75.0000 mL | Freq: Once | INTRAMUSCULAR | Status: AC | PRN
  Administered 2023-09-30: 75 mL via INTRAVENOUS

## 2023-10-06 ENCOUNTER — Other Ambulatory Visit: Payer: Self-pay | Admitting: Cardiology

## 2023-10-06 DIAGNOSIS — I2510 Atherosclerotic heart disease of native coronary artery without angina pectoris: Secondary | ICD-10-CM

## 2023-10-08 ENCOUNTER — Encounter: Payer: Self-pay | Admitting: Urology

## 2023-10-08 ENCOUNTER — Ambulatory Visit
Admission: RE | Admit: 2023-10-08 | Discharge: 2023-10-08 | Disposition: A | Discharge status: Home / Self Care | Payer: Medicare PPO | Source: Ambulatory Visit | Attending: Urology | Admitting: Urology

## 2023-10-08 VITALS — Ht 67.0 in | Wt 188.0 lb

## 2023-10-08 DIAGNOSIS — Z87891 Personal history of nicotine dependence: Secondary | ICD-10-CM | POA: Diagnosis not present

## 2023-10-08 DIAGNOSIS — C3411 Malignant neoplasm of upper lobe, right bronchus or lung: Secondary | ICD-10-CM

## 2023-10-08 NOTE — Progress Notes (Signed)
 Telephone nursing appointment for review of most recent CT-Chest results. I verified patient's identity x2 and began nursing interview.   Diagnosis: Stage IA, NSCLC in the RUL lung   Patient "states" issues as follows...  -Pain: Denies related pain -Fatigue: Denies -Skin: Denies -Lungs: SOB- Hx of COPD -Appetite: Good   Patient denies any other related issues at this time.   Meaningful use complete.   Patient aware of their telephone appointment w/ Ashlyn Bruning PA-C. I left my extension [phone] in case patient needs anything. Patient verbalized understanding. This concludes the nursing interview.   Patient preferred phone # [phone]   Avery Bodo, LPN

## 2023-10-08 NOTE — Progress Notes (Signed)
 Radiation Oncology         [phone] ________________________________  Name: Glen [name] MRN: [id number]  Date: 10/08/2023  DOB: 03-30-1941  Post Treatment Note  CC: Lanae Pinal, MD  Lanae Pinal, MD  Diagnosis:    83 year old male putative, Stage IA, NSCLC in the RUL lung  Interval Since Last Radiation:  2 years  10/10/21 - 10/16/21: SBRT//  The target in the RUL lung was treated to 54 Gy in 3 fractions of 18 Gy  Narrative:  I spoke with the patient to conduct his routine scheduled follow up visit via telephone to review his recent post-treatment CT Chest results to spare the patient unnecessary potential exposure in the healthcare setting during the current COVID-19 pandemic.  The patient was notified in advance and gave permission to proceed with this visit format.  He tolerated radiation treatment relatively well without any ill side effects aside from mild fatigue and mild dysphagia.                              On review of systems, the patient states that he is doing well in general.  He reports that the dysphagia has resolved completely and his energy level is gradually improving.  He has persistent, shortness of breath with exertion and a non-productive cough (clear/white sputum in am) but denies chest pain, fever, chills or night sweats. The shortness of breath is not worsening at this point. He has continued using his inhalers for COPD and has noted some improvement in his breathing since switching to Breztri  and he feels well otherwise. He has been very busy recently helping close his sister's estate. His sister passed away 2023-09-30 after a short battle with brain tumor that was diagnosed in 12/2022.  His post-treatment Chest CT from 03/14/22 showed findings consistent with developing radiation pneumonitis and fibrosis but the treated nodule in the RUL appeared stable.  There was a new subpleural nodule of the medial right lower lobe measuring 1.4 x 0.8 cm that was  nonspecific and felt most likely infectious or inflammatory, particularly in light of the rapid development as well as additional new more clearly infectious/inflammatory nodularity seen in the left lung base.  Therefore, we tried a course of antibiotics and prednisone  which he completed as prescribed and has not noticed significant improvement in his breathing although the productive cough has resolved. We obtained a short interval follow up with repeat CT Chest on 05/28/22 and this showed the post radiation fibrosis in the right upper lobe was similar to that seen on the prior scan although it had become somewhat more confluent inferiorly with more prominent volume loss. The 9 x 4 mm right upper lobe pulmonary nodule measured previously was no longer discretely evident in the background scarring.  There was interval resolution of the right lower lobe subpleural nodule and clustered tree-in-bud nodularity seen previously in the left lower lobe with no new suspicious pulmonary nodule or mass.  Follow up CT Chest scans have continued to show a stable appearance without evidence of local recurrence or metastatic disease and stable post radiation changes in the right upper lobe lung. We reviewed the results of his most recent CT Chest from 09/30/23 today by telephone and this again remains stable    ALLERGIES:  is allergic to aspirin, lisinopril, nsaids, and other.  Meds: Current Outpatient Medications  Medication Sig Dispense Refill   acetaminophen  (TYLENOL ) 650 MG CR tablet Take 1,950  mg by mouth at bedtime.     albuterol  (PROAIR  HFA) 108 (90 Base) MCG/ACT inhaler Inhale 2 puffs into the lungs every 6 (six) hours as needed for wheezing or shortness of breath. Inhale 2 puffs every 6 hours as needed 17 each 5   ALPRAZolam  (XANAX ) 0.25 MG tablet Take 0.25 mg by mouth daily as needed for anxiety.      amLODipine  (NORVASC ) 10 MG tablet Take 10 mg by mouth daily.     aspirin EC 81 MG tablet 1 tablet      atorvastatin  (LIPITOR) 40 MG tablet Take 40 mg by mouth at bedtime.      Budeson-Glycopyrrol-Formoterol (BREZTRI  AEROSPHERE) 160-9-4.8 MCG/ACT AERO INHALE 2 PUFFS INTO THE LUNGS IN THE MORNING AND AT BEDTIME 10.7 g 11   cetirizine (ZYRTEC) 10 MG chewable tablet Chew 10 mg by mouth daily.     Cyanocobalamin (VITAMIN B12) 1000 MCG TBCR 1 tablet     ezetimibe  (ZETIA ) 10 MG tablet Take 1 tablet (10 mg total) by mouth daily. 15 tablet 0   ferrous sulfate 325 (65 FE) MG tablet Take 325 mg by mouth daily.     fluticasone  (FLONASE) 50 MCG/ACT nasal spray Place 2 sprays into both nostrils daily as needed for allergies.     gabapentin  (NEURONTIN ) 300 MG capsule TAKE 2 CAPSULES(600 MG) BY MOUTH AT BEDTIME 180 capsule 3   hydrochlorothiazide  (MICROZIDE ) 12.5 MG capsule Take 12.5 mg by mouth daily.      levalbuterol  (XOPENEX ) 0.63 MG/3ML nebulizer solution Use 1 vial via nebulizer every 4 hours as needed for wheezing or shortness of breath. 425 mL 3   omeprazole  (PRILOSEC) 20 MG capsule Take 20 mg by mouth daily.     Polyethylene Glycol 3350  (MIRALAX PO) Take 17 g by mouth daily.     primidone  (MYSOLINE ) 50 MG tablet TAKE 2 TABLETS BY MOUTH IN THE MORNING AND 1 TABLET IN THE EVENING 270 tablet 2   sertraline  (ZOLOFT ) 100 MG tablet Take 100 mg by mouth daily.   4   telmisartan  (MICARDIS ) 40 MG tablet Take 40 mg by mouth daily.     No current facility-administered medications for this encounter.    Physical Findings:  height is 5\' 7"  (1.702 m) and weight is 188 lb (85.3 kg).  Pain Assessment Pain Score: 0-No pain/10 Unable to assess due to telephone follow-up visit format.  Lab Findings: Lab Results  Component Value Date   WBC 5.4 07/25/2018   HGB 8.9 (L) 07/25/2018   HCT 27.7 (L) 07/25/2018   MCV 93.3 07/25/2018   PLT 245 07/25/2018     Radiographic Findings: CT Chest W Contrast Result Date: 10/07/2023 CLINICAL DATA:  Right upper lobe non-small cell lung cancer treated with radiation therapy  in 2023. * Tracking Code: BO * EXAM: CT CHEST WITH CONTRAST TECHNIQUE: Multidetector CT imaging of the chest was performed during intravenous contrast administration. RADIATION DOSE REDUCTION: This exam was performed according to the departmental dose-optimization program which includes automated exposure control, adjustment of the mA and/or kV according to patient size and/or use of iterative reconstruction technique. CONTRAST:  75mL OMNIPAQUE  IOHEXOL  300 MG/ML  SOLN COMPARISON:  03/28/2023 FINDINGS: Cardiovascular: Aortic atherosclerosis. Normal heart size, without pericardial effusion. Three vessel coronary artery calcification. No central pulmonary embolism, on this non-dedicated study. Mediastinum/Nodes: No supraclavicular adenopathy. No mediastinal or hilar adenopathy, given limitations of unenhanced CT. Lungs/Pleura: No pleural fluid.  Moderate centrilobular emphysema. Radiation fiducials in the central right upper lobe with surrounding airspace and  ground-glass opacity. No well-defined residual or locally recurrent disease. Posterior right upper lobe mild interstitial thickening including on 51/5 is unchanged and likely post infectious/inflammatory. Similar findings more superiorly and laterally on 40/5. Millimetric pulmonary nodules again identified including in the right upper lobe on 28/5 and superior segment left lower lobe on 67/5. Upper Abdomen: Normal imaged portions of the liver, spleen, stomach, pancreas, gallbladder, adrenal glands, kidneys. Musculoskeletal: Advanced thoracic spondylosis. IMPRESSION: 1. Similar appearance of radiation change in the right upper lobe, without findings of recurrent or metastatic disease. 2. Aortic Atherosclerosis (ICD10-I70.0) and Emphysema (ICD10-J43.9). Coronary artery atherosclerosis. Electronically Signed   By: Lore Rode M.D.   On: 10/07/2023 16:50    Impression/Plan: 5.  83 year old male putative, Stage IA, NSCLC in the RUL lung. He appears to have  recovered well from the effects of the stereotactic body radiotherapy (SBRT) and has noticed some improvement in his breathing with the use of his new inhalers.  His recent follow up CT Chest shows a stable appearance without evidence of local recurrence or disease progression.  Therefore, we will continue with serial CT Chest scans every 6 months going forward, to continue to monitor for any evidence of disease recurrence or progression and will follow-up by phone to review the results and any recommendations following each scan.  Once we reach 5 years without evidence of disease recurrence, we can then moved to annual chest CT scans for surveillance.  He appears to have a good understanding of these recommendations and is comfortable and in agreement with the stated plan.  He knows that he is welcome to call anytime in the interim with any questions or concerns related to his radiation.  He will continue follow up in pulmonology for management of his COPD and will also continue in routine follow-up under the care and direction of Dr. Scherrie Curt for continued surveillance of his colon cancer which remains in remission.  I personally spent 20 minutes in this encounter including chart review, reviewing radiological studies, meeting face-to-face with the patient, entering orders and completing documentation.     Arta Bihari, PA-C

## 2023-10-13 ENCOUNTER — Other Ambulatory Visit

## 2023-10-13 ENCOUNTER — Ambulatory Visit: Admitting: Oncology

## 2023-11-03 ENCOUNTER — Inpatient Hospital Stay: Admitting: Oncology

## 2023-11-03 ENCOUNTER — Inpatient Hospital Stay: Attending: Oncology

## 2023-11-03 VITALS — BP 117/59 | HR 71 | Temp 98.3°F | Resp 18 | Ht 67.0 in | Wt 192.2 lb

## 2023-11-03 DIAGNOSIS — R911 Solitary pulmonary nodule: Secondary | ICD-10-CM | POA: Insufficient documentation

## 2023-11-03 DIAGNOSIS — G629 Polyneuropathy, unspecified: Secondary | ICD-10-CM | POA: Insufficient documentation

## 2023-11-03 DIAGNOSIS — Z85038 Personal history of other malignant neoplasm of large intestine: Secondary | ICD-10-CM | POA: Insufficient documentation

## 2023-11-03 DIAGNOSIS — Z923 Personal history of irradiation: Secondary | ICD-10-CM | POA: Insufficient documentation

## 2023-11-03 DIAGNOSIS — C186 Malignant neoplasm of descending colon: Secondary | ICD-10-CM

## 2023-11-03 DIAGNOSIS — E538 Deficiency of other specified B group vitamins: Secondary | ICD-10-CM | POA: Insufficient documentation

## 2023-11-03 DIAGNOSIS — K2270 Barrett's esophagus without dysplasia: Secondary | ICD-10-CM | POA: Insufficient documentation

## 2023-11-03 LAB — CEA (ACCESS): CEA (CHCC): 1.81 ng/mL (ref 0.00–5.00)

## 2023-11-03 NOTE — Progress Notes (Signed)
 [facility] OFFICE PROGRESS NOTE   Diagnosis: Colon cancer  INTERVAL HISTORY:   [name] returns as scheduled.  Good appetite.  He has irregular bowel habits with constipation.  No bleeding.  No other complaint.  He continues follow-up with radiation oncology after SBRT and for surveillance of lung nodules.  He sees Dr. Rosalie for follow-up of Barrett's esophagus.  Objective:  Vital signs in last 24 hours:  Blood pressure (!) 117/59, pulse 71, temperature 98.3 F (36.8 C), temperature source Temporal, resp. rate 18, height 5' 7 (1.702 m), weight 192 lb 3.2 oz (87.2 kg), SpO2 96%.     Lymphatics: No cervical, supraclavicular, axillary, or inguinal nodes Resp: Distant breath sounds, coarse end inspiratory rhonchi at the posterior base bilaterally, no respiratory distress Cardio: Regular rate and rhythm GI: Nontender, no mass, no hepatosplenomegaly Vascular: No leg edema   Lab Results:  Lab Results  Component Value Date   WBC 5.4 07/25/2018   HGB 8.9 (L) 07/25/2018   HCT 27.7 (L) 07/25/2018   MCV 93.3 07/25/2018   PLT 245 07/25/2018   NEUTROABS 5.3 07/17/2018    CMP  Lab Results  Component Value Date   NA 137 09/11/2021   K 4.5 09/11/2021   CL 105 09/11/2021   CO2 26 09/11/2021   GLUCOSE 121 (H) 09/11/2021   BUN 21 09/11/2021   CREATININE 1.10 12/07/2021   CALCIUM 9.5 09/11/2021   PROT 6.0 (L) 07/17/2018   ALBUMIN 3.4 (L) 07/17/2018   AST 19 07/17/2018   ALT 14 07/17/2018   ALKPHOS 85 07/17/2018   BILITOT 0.6 07/17/2018   GFRNONAA >60 09/11/2021   GFRAA >60 07/25/2018    Lab Results  Component Value Date   CEA1 1.87 09/14/2020   CEA 1.81 11/03/2023    Lab Results  Component Value Date   LABPROT 97 04/03/2018    Imaging:  No results found.  Medications: I have reviewed the patient's current medications.   Assessment/Plan: Colon cancer  Colonoscopy by Dr. Celestia 06/22/2018-polypoid completely obstructing large mass was found  in the proximal sigmoid colon.  Pathology showed fragments of ulcer and inflammatory debris; no viable tissue present.   CT abdomen/pelvis 06/30/2018-severe descending and sigmoid diverticulosis.  There was a focal dilatation of the mid descending colon to approximately 8.5 x 6.2 cm.  There was no overt exophytic mass or mesenteric lymphadenopathy.  No evidence of abdominal metastatic disease.   07/21/2018 open left colectomy with primary anastomosis, primary repair of umbilical hernia by Dr. Gail.  Pathology showed invasive moderately differentiated adenocarcinoma with mucinous features measuring 7.5 cm.  There was circumferential involvement of the descending colon with luminal obstruction.  Carcinoma focally invaded into the pericolonic soft tissue.  Margins negative.  No lymphovascular or perineural invasion.  21 lymph nodes negative for cancer; loss of expression MLH1 and PMS2; MSI high.  MLH1 hyper methylation present Colonoscopy 05/27/2019-polyps removed from the sigmoid, descending, cecum, ascending, and transverse colon, nodular mucosa at the colonic anastomosis biopsied-benign ulcerated mucosa, polyps were tubular adenomas, mucosal prolapse polyps,  and a hyperplastic polyp Colonoscopy 08/03/2020: No polyps Upper endoscopy 06/22/2018-esophageal mucosal changes suggestive of long segment Barrett's esophagus.  A few gastric polyps.  Normal examined duodenum.  GERD.  Stomach biopsy-fundic gland polyp.  Negative for dysplasia.  Esophagus biopsy-Barrett's mucosa.  Negative for dysplasia.   Endoscopy 08/03/2020: Barrett's esophagus, negative for dysplasia COPD Hypertension B12 deficiency Peripheral neuropathy Right lung nodule CT chest 07/31/2021-increased size of a right upper lobe nodule compared  to December 2022 PET 08/29/2021-hypermetabolic right upper lobe nodule, no adenopathy or evidence of distant metastatic disease Bronchoscopy biopsy of right upper lobe nodule 09/11/2021-brushing with atypical  large cells suspicious for tumor SBRT to right upper lung lesion 10/10/2021 - 10/17/2018 23-54 Gray in 3 fractions CT chest 11/29/2021-decrease size of right upper lobe nodule, no new nodules CT chest 03/14/2022-development of groundglass and consolidative airspace opacity in the right upper lobe about a nodule with adjacent biopsy clip, nodule unchanged in size, new subpleural nodule in the medial right lower lobe-nonspecific CT chest 09/30/2023-radiation change in the right upper lobe, no evidence of recurrent or metastatic disease      Disposition: [name] is in clinical remission from colon cancer.  He has a good prognosis for long-term survival.  He was discharged from the medical oncology clinic today.  I am available to see him in the future as needed. He will continue follow-up with Dr. Rosalie for surveillance of Barrett's esophagus.  He is followed by radiation oncology after undergoing SBRT for a right lung lesion in 2023 and for surveillance of lung nodules.    Glen Hof, MD  11/03/2023  11:40 AM

## 2023-11-08 DIAGNOSIS — S51811A Laceration without foreign body of right forearm, initial encounter: Secondary | ICD-10-CM | POA: Diagnosis not present

## 2023-11-12 DIAGNOSIS — H52203 Unspecified astigmatism, bilateral: Secondary | ICD-10-CM | POA: Diagnosis not present

## 2023-11-12 DIAGNOSIS — H532 Diplopia: Secondary | ICD-10-CM | POA: Diagnosis not present

## 2023-11-12 DIAGNOSIS — Z961 Presence of intraocular lens: Secondary | ICD-10-CM | POA: Diagnosis not present

## 2023-12-22 DIAGNOSIS — G4733 Obstructive sleep apnea (adult) (pediatric): Secondary | ICD-10-CM | POA: Diagnosis not present

## 2023-12-30 ENCOUNTER — Telehealth: Payer: Self-pay | Admitting: Student

## 2023-12-30 DIAGNOSIS — G4733 Obstructive sleep apnea (adult) (pediatric): Secondary | ICD-10-CM

## 2023-12-30 NOTE — Telephone Encounter (Signed)
 Copied from CRM [id number]. Topic: Clinical - Order For Equipment >> Dec 30, 2023  3:06 PM Benton O wrote: Reason for RMF:[id number] is calling because he is a cpap user i had been seeing dr brenna dont know who I should see now but I need katherine cobbto order me a new cpap machine . it has exceeded the number of hours it suppose to  So patient is saying that its running out of time and need a new one  Patient uses [employer] for a DME company  [phone]

## 2023-12-31 NOTE — Telephone Encounter (Signed)
 I have sent Urgent message to [facility] to check on this issue

## 2023-12-31 NOTE — Telephone Encounter (Signed)
 Order was placed for new CPAP to [facility] in January can we check on this?

## 2024-01-01 NOTE — Telephone Encounter (Signed)
 Ok to place new CPAP order 5-20 cmH2O, heated humidity, mask of choice. He will need OV within 31-90 days of receiving new machine.

## 2024-01-01 NOTE — Telephone Encounter (Signed)
 I received a response from [place] with [employer] [employer]   In January patient spoke with Veva and stated he wasn't going to pay a reoccurring for a new machine he would keep the one he had and hung up on her. We will need a new order along with office visit notes in the last 6 months showing he is benefit and compliant with his current machine.

## 2024-01-02 NOTE — Telephone Encounter (Signed)
 New CPAP order has been placed. I have notified the patient. He will call back once he has received the CPAP to schedule a follow up.  Glen [name] this is just an BURUNDI.   Nothing further needed.

## 2024-01-06 ENCOUNTER — Telehealth: Payer: Self-pay | Admitting: Nurse Practitioner

## 2024-01-06 NOTE — Telephone Encounter (Signed)
 Would you mind signing this order? Thanks!

## 2024-01-08 NOTE — Telephone Encounter (Signed)
 Yes mam and order was received through [facility] yesterday. NFN

## 2024-01-15 ENCOUNTER — Ambulatory Visit: Admitting: Nurse Practitioner

## 2024-01-15 ENCOUNTER — Encounter: Payer: Self-pay | Admitting: Nurse Practitioner

## 2024-01-15 VITALS — BP 116/68 | HR 71 | Ht 67.0 in | Wt 190.0 lb

## 2024-01-15 DIAGNOSIS — G4733 Obstructive sleep apnea (adult) (pediatric): Secondary | ICD-10-CM

## 2024-01-15 DIAGNOSIS — C3411 Malignant neoplasm of upper lobe, right bronchus or lung: Secondary | ICD-10-CM | POA: Diagnosis not present

## 2024-01-15 DIAGNOSIS — J449 Chronic obstructive pulmonary disease, unspecified: Secondary | ICD-10-CM

## 2024-01-15 DIAGNOSIS — J301 Allergic rhinitis due to pollen: Secondary | ICD-10-CM

## 2024-01-15 DIAGNOSIS — R918 Other nonspecific abnormal finding of lung field: Secondary | ICD-10-CM

## 2024-01-15 NOTE — Assessment & Plan Note (Signed)
 Compensated on current regimen with moderate symptom burden. No recent exacerbations/hospitalizations. Encouraged to work on graded exercises. Action plan in place.

## 2024-01-15 NOTE — Patient Instructions (Signed)
 Continue Breztri  2 puffs Twice daily. Brush tongue and rinse mouth afterwards Continue Albuterol  inhaler 2 puffs or 3 mL neb every 6 hours as needed for shortness of breath or wheezing. Notify if symptoms persist despite rescue inhaler/neb use. Use your neb twice a day before your Breztri   Continue flonase nasal spray 2 sprays each nostril daily Use guaifenesin 600 mg Twice daily for cough/congestion/thick phlegm    Continue to use CPAP every night, minimum of 4-6 hours a night.  Be aware of reduced alertness and do not drive or operate heavy machinery if experiencing this or drowsiness.    You should be hearing about the  new CPAP machine in the next week. Call [facility] if not    Follow up with oncology as scheduled    Follow up in 10-12 weeks with Glen [name] or Glen [name]. If symptoms worsen, please contact office for sooner follow up or seek emergency care.

## 2024-01-15 NOTE — Assessment & Plan Note (Addendum)
 OSA on CPAP. Receives benefit from use. Needs a new machine. Will follow up with [facility] on prior order. Aware of proper care/use. Healthy weight loss encouraged. Safe driving practices reviewed.   Patient Instructions  Continue Breztri  2 puffs Twice daily. Brush tongue and rinse mouth afterwards Continue Albuterol  inhaler 2 puffs or 3 mL neb every 6 hours as needed for shortness of breath or wheezing. Notify if symptoms persist despite rescue inhaler/neb use. Use your neb twice a day before your Breztri   Continue flonase nasal spray 2 sprays each nostril daily Use guaifenesin 600 mg Twice daily for cough/congestion/thick phlegm    Continue to use CPAP every night, minimum of 4-6 hours a night.  Be aware of reduced alertness and do not drive or operate heavy machinery if experiencing this or drowsiness.    You should be hearing about the  new CPAP machine in the next week. Call [facility] if not    Follow up with oncology as scheduled    Follow up in 10-12 weeks with Glen [name] or Glen [name]. If symptoms worsen, please contact office for sooner follow up or seek emergency care.

## 2024-01-15 NOTE — Progress Notes (Signed)
 @Patient  ID: Glen [name], male    DOB: [birth year], 83 y.o.   MRN: [id number]  Chief Complaint  Patient presents with   Sleep Apnea    Referring provider: Regino Slater, MD  HPI: 83 year old male, former smoker followed for lung nodule, COPD mixed type, and OSA on CPAP. He is a patient of Dr. Saundra and previously followed by Dr. Brenna for pulmonary nodule. Past medical history significant for HTN, CAD, allergic rhinitis, HLD.   TEST/EVENTS:  02/26/2013 PFT: FVC 70, FEV1 46, ratio 52, TLC 93, DLCOunc 87 corrects to normal for alveolar volume  09/23/2022 CT chest wo contrast: atherosclerosis. Centrilobular emphysema. Chronic bandlike scarring in the RUL from previous radiation therapy, unchanged. Stable 3 mm RUL nodule, unchanged and considered benign. Chronic idiopathic skeletal hyperostosis.  03/28/2023 CT chest wo contrast: trace pericardial fluid. Centrilobular emphysematous changes. Bandlike opacity in right midlung, similar. Persistent reticulonodular changes in RUL. Post treatment changes. No new dominant mass. Stable 3 mm RUL nodule. Fatty liver.  09/30/2023 CT chest w con: atherosclerosis. Emphysema. RUL post radiation changes. RUL mild interstitial thickening, unchanged. Similar findings more superiorly and laterally. Millimetric nodules identified in RUL, LLL. Spondylosis   06/04/2022: OV with Dr. Brenna. CT imaging 07/2021 with abnormal RUL nodule. PET scan revealed hypermetabolic uptake within the RUL with no evidence of a distant metastatic disease. Biopsy was not definitive. He was referred for empiric SBRT. 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 Using inhaler regularly. Does have daily congestion. Not using nebs regularly but does feel like they help. Stopped Trelegy. Started on Breztri . Recommended using nebs in AM and PM.   12/03/2022: OV with Zionah Criswell NP for follow up. He was changed to Breztri  at his last visit. He does feel like this has helped opened up  his chest more. He's not coughing as much and doesn't feel as congested. His breathing feels relatively the same. His only complaint is the fact he has to do it twice a day. He does use his nebs a few times a week but not daily. Not interested in pulmonary rehab right now. Just finished PT for his knees, which he felt helped. His recent CT chest was stable with post treatment changes; no recurrence of disease. He denies any hemoptysis, weight loss, anorexia.  11/03/2022-12/02/2022: CPAP 5-20 cmH2O 30/30 days; 100% >4 hr; average use 9 hr 9 min Pressure 95th 12.4 Leaks 95th 26.6 AHI 1  06/06/2023: OV with Tyren Dugar NP Patient presents today for follow up. He's had some life stressors and family events since he was here last. Unfortunately, his sister had a significant stroke last year. They found a malignant brain tumor after this. She has been undergoing treatment since. He is helping to care for her.  His breathing has been stable. Not having any issues right now. He hasn't had to have any steroids or antibiotics. Occasional minimal cough with clear phlegm. He does say that the mucus can be very thick at times. No wheezing or chest congestion. Uses his Breztri  twice a day. Hasn't needed his rescue. Takes mucinex once a day  He does use his CPAP nightly. Receives benefit from use. Believes he's due for a new machine.  01/15/2024: Today - follow up Discussed the use of AI scribe software for clinical note transcription with the patient, who gave verbal consent to proceed.  History of Present Illness Glen [name] is an 83 year old male who presents for a follow-up regarding CPAP machine replacement.  He is seeking a replacement for his CPAP machine due to the declining motor life of his current device. An order was placed in January, but he initially delayed obtaining a new machine due to an issue with his DME. Another order was placed the end of August but he has yet to hear anything. He sleeps  with his CPAP every night. Feels better rested with it. Energy levels are decent during the day. No drowsy driving. Mask fits well. No issues with pressures.  He had a repeat CT chest in May that showed stable post radiation changes and no evidence of disease recurrence.   His sister recently passed away after suffering a stroke. He is currently involved in settling her estate, which has been a source of stress due to family issues.   His breathing has been 'pretty good'. No new or worsening respiratory symptoms are reported. Not having to use his rescue inhaler. No significant cough or wheezing. No leg swelling, fevers, hemoptysis, night sweats, weight loss, anorexia.   12/15/2023-01/13/2024: CPAP 5-20 cmH2O 29/30 days; 97% >4 hr; average use 9 hr 18 min Pressure 95th 12 Leaks 95th 30.1 AHI 2.6    Allergies  Allergen Reactions   Aspirin Other (See Comments)    pt has bleeding ulcers   Lisinopril Cough   Nsaids     Other reaction(s): GI upset   Other     GI Upset (intolerance)  AVOID NSAIDS    Immunization History  Administered Date(s) Administered   INFLUENZA, HIGH DOSE SEASONAL PF 01/20/2019, 01/24/2020   Influenza Split 05/10/2003, 04/17/2009, 03/15/2010, 01/21/2011, 02/11/2012, 05/09/2012, 04/27/2013, 02/04/2014, 02/25/2018   Influenza Whole 03/14/2011   Influenza,inj,Quad PF,6+ Mos 02/26/2013, 02/04/2014, 02/06/2015, 05/20/2016, 02/25/2018, 01/13/2019, 01/24/2020, 01/25/2021   Influenza-Unspecified 02/09/2022, 01/26/2023   PFIZER(Purple Top)SARS-COV-2 Vaccination 06/04/2019, 06/25/2019, 07/13/2019, 02/28/2020, 08/31/2020   Pfizer Covid-19 Vaccine Bivalent Booster 59yrs & up 05/18/2021, 06/13/2022   Pneumococcal Conjugate-13 12/21/2013   Pneumococcal Polysaccharide-23 05/22/2007   Td 11/26/2000   Tdap 11/09/2010, 01/25/2021   Zoster, Live 12/18/2016, 02/27/2017    Past Medical History:  Diagnosis Date   Allergic rhinitis, cause unspecified    Anxiety    Asthma     Cancer (HCC) 07/21/2018   colon   COPD (chronic obstructive pulmonary disease) (HCC)    Depression    Dyspnea    upon exertion   GERD (gastroesophageal reflux disease)    Hypertension    Pre-diabetes    PUD (peptic ulcer disease)    avoids aspirin   Sleep apnea    uses C-PAP    Tobacco History: Social History   Tobacco Use  Smoking Status Former   Current packs/day: 0.00   Types: Cigarettes   Quit date: 04/12/2011   Years since quitting: 12.7   Passive exposure: Never  Smokeless Tobacco Never   Counseling given: Not Answered   Outpatient Medications Prior to Visit  Medication Sig Dispense Refill   acetaminophen  (TYLENOL ) 650 MG CR tablet Take 1,300 mg by mouth at bedtime. Will take in am prn as well     albuterol  (PROAIR  HFA) 108 (90 Base) MCG/ACT inhaler Inhale 2 puffs into the lungs every 6 (six) hours as needed for wheezing or shortness of breath. Inhale 2 puffs every 6 hours as needed 17 each 5   ALPRAZolam  (XANAX ) 0.25 MG tablet Take 0.25 mg by mouth daily as needed for anxiety.      amLODipine  (NORVASC ) 10 MG tablet Take 10 mg by mouth daily.     aspirin  EC 81 MG tablet 1 tablet     atorvastatin  (LIPITOR) 40 MG tablet Take 40 mg by mouth at bedtime.      Budeson-Glycopyrrol-Formoterol (BREZTRI  AEROSPHERE) 160-9-4.8 MCG/ACT AERO INHALE 2 PUFFS INTO THE LUNGS IN THE MORNING AND AT BEDTIME 10.7 g 11   cetirizine (ZYRTEC) 10 MG chewable tablet Chew 10 mg by mouth daily.     Cyanocobalamin (VITAMIN B12) 1000 MCG TBCR 1 tablet     ezetimibe  (ZETIA ) 10 MG tablet Take 1 tablet (10 mg total) by mouth daily. 15 tablet 0   ferrous sulfate 325 (65 FE) MG tablet Take 325 mg by mouth daily.     fluticasone  (FLONASE) 50 MCG/ACT nasal spray Place 2 sprays into both nostrils daily as needed for allergies.     gabapentin  (NEURONTIN ) 300 MG capsule TAKE 2 CAPSULES(600 MG) BY MOUTH AT BEDTIME 180 capsule 3   hydrochlorothiazide  (MICROZIDE ) 12.5 MG capsule Take 12.5 mg by mouth daily.       levalbuterol  (XOPENEX ) 0.63 MG/3ML nebulizer solution Use 1 vial via nebulizer every 4 hours as needed for wheezing or shortness of breath. 425 mL 3   mupirocin ointment (BACTROBAN) 2 % Apply 1 Application topically 2 (two) times daily.     omeprazole  (PRILOSEC) 20 MG capsule Take 20 mg by mouth daily.     Polyethylene Glycol 3350  (MIRALAX PO) Take 17 g by mouth daily.     primidone  (MYSOLINE ) 50 MG tablet TAKE 2 TABLETS BY MOUTH IN THE MORNING AND 1 TABLET IN THE EVENING 270 tablet 2   sertraline  (ZOLOFT ) 100 MG tablet Take 100 mg by mouth daily.   4   telmisartan  (MICARDIS ) 40 MG tablet Take 40 mg by mouth daily.     No facility-administered medications prior to visit.     Review of Systems:   Constitutional: No weight loss or gain, night sweats, fevers, chills, fatigue, or lassitude. HEENT: No headaches CV:  No chest pain, orthopnea, PND, swelling in lower extremities, anasarca, dizziness, palpitations, syncope Resp: +shortness of breath with exertion (baseline); occasional cough. No excess mucus or change in color of mucus. No hemoptysis. No wheezing.  No chest wall deformity GI:  No heartburn, indigestion, loss of appetite Psych: No depression or anxiety. Mood stable.     Physical Exam:  BP 116/68   Pulse 71   Ht 5' 7 (1.702 m)   Wt 190 lb (86.2 kg)   SpO2 95%   BMI 29.76 kg/m   GEN: Pleasant, interactive, well-kempt; obese; in no acute distress HEENT:  Normocephalic and atraumatic. PERRLA. Sclera white. Nasal turbinates pink, moist and patent bilaterally. No rhinorrhea present. Oropharynx pink and moist, without exudate or edema. No lesions, ulcerations, or postnasal drip.  NECK:  Supple w/ fair ROM. No JVD present. No lymphadenopathy.   CV: RRR, no m/r/g, no peripheral edema. Pulses intact, +2 bilaterally.  PULMONARY:  Unlabored, regular breathing. Clear bilaterally A&P w/o wheezes/rales/rhonchi. No accessory muscle use.  GI: BS present and normoactive. Soft,  non-tender to palpation.  MSK: No erythema, warmth or tenderness. Cap refil <2 sec all extrem.  Neuro: A/Ox3. No focal deficits noted.   Skin: Warm, no lesions or rashe Psych: Normal affect and behavior. Judgement and thought content appropriate.     Lab Results:  CBC    Component Value Date/Time   WBC 5.4 07/25/2018 0330   RBC 2.97 (L) 07/25/2018 0330   HGB 8.9 (L) 07/25/2018 0330   HCT 27.7 (L) 07/25/2018 0330   PLT 245  07/25/2018 0330   MCV 93.3 07/25/2018 0330   MCH 30.0 07/25/2018 0330   MCHC 32.1 07/25/2018 0330   RDW 14.3 07/25/2018 0330   LYMPHSABS 1.2 07/17/2018 0929   MONOABS 0.6 07/17/2018 0929   EOSABS 0.3 07/17/2018 0929   BASOSABS 0.1 07/17/2018 0929    BMET    Component Value Date/Time   NA 137 09/11/2021 1143   K 4.5 09/11/2021 1143   CL 105 09/11/2021 1143   CO2 26 09/11/2021 1143   GLUCOSE 121 (H) 09/11/2021 1143   BUN 21 09/11/2021 1143   CREATININE 1.10 12/07/2021 0835   CALCIUM 9.5 09/11/2021 1143   GFRNONAA >60 09/11/2021 1143   GFRAA >60 07/25/2018 0330    BNP No results found for: BNP   Imaging:  No results found.  Administration History     None           No data to display          No results found for: NITRICOXIDE      Assessment & Plan:   Obstructive sleep apnea OSA on CPAP. Receives benefit from use. Needs a new machine. Will follow up with [facility] on prior order. Aware of proper care/use. Healthy weight loss encouraged. Safe driving practices reviewed.   Patient Instructions  Continue Breztri  2 puffs Twice daily. Brush tongue and rinse mouth afterwards Continue Albuterol  inhaler 2 puffs or 3 mL neb every 6 hours as needed for shortness of breath or wheezing. Notify if symptoms persist despite rescue inhaler/neb use. Use your neb twice a day before your Breztri   Continue flonase nasal spray 2 sprays each nostril daily Use guaifenesin 600 mg Twice daily for cough/congestion/thick phlegm    Continue to  use CPAP every night, minimum of 4-6 hours a night.  Be aware of reduced alertness and do not drive or operate heavy machinery if experiencing this or drowsiness.    You should be hearing about the  new CPAP machine in the next week. Call [facility] if not    Follow up with oncology as scheduled    Follow up in 10-12 weeks with Dr. Neysa or Izetta Malachy [name]. If symptoms worsen, please contact office for sooner follow up or seek emergency care.    COPD mixed type (HCC) Compensated on current regimen with moderate symptom burden. No recent exacerbations/hospitalizations. Encouraged to work on graded exercises. Action plan in place.    Putative stage I cancer of right upper lobe of lung (HCC) Stable CT May 2025. Follow up with radiation oncology as scheduled.     I spent 25 minutes of dedicated to the care of this patient on the date of this encounter to include pre-visit review of records, face-to-face time with the patient discussing conditions above, post visit ordering of testing, clinical documentation with the electronic health record, making appropriate referrals as documented, and communicating necessary findings to members of the patients care team.  Glen LULLA Malachy, NP 01/15/2024  Pt aware and understands NP's role.

## 2024-01-15 NOTE — Assessment & Plan Note (Signed)
 Stable CT May 2025. Follow up with radiation oncology as scheduled.

## 2024-02-03 DIAGNOSIS — R0989 Other specified symptoms and signs involving the circulatory and respiratory systems: Secondary | ICD-10-CM | POA: Diagnosis not present

## 2024-02-03 DIAGNOSIS — J029 Acute pharyngitis, unspecified: Secondary | ICD-10-CM | POA: Diagnosis not present

## 2024-02-03 DIAGNOSIS — R051 Acute cough: Secondary | ICD-10-CM | POA: Diagnosis not present

## 2024-02-10 ENCOUNTER — Encounter: Payer: Self-pay | Admitting: Acute Care

## 2024-02-10 ENCOUNTER — Ambulatory Visit: Admitting: Acute Care

## 2024-02-10 ENCOUNTER — Ambulatory Visit: Payer: Self-pay | Admitting: Nurse Practitioner

## 2024-02-10 ENCOUNTER — Ambulatory Visit

## 2024-02-10 VITALS — BP 129/69 | HR 96 | Temp 98.3°F | Ht 67.0 in | Wt 189.6 lb

## 2024-02-10 DIAGNOSIS — J9811 Atelectasis: Secondary | ICD-10-CM | POA: Diagnosis not present

## 2024-02-10 DIAGNOSIS — J069 Acute upper respiratory infection, unspecified: Secondary | ICD-10-CM

## 2024-02-10 DIAGNOSIS — R0602 Shortness of breath: Secondary | ICD-10-CM | POA: Diagnosis not present

## 2024-02-10 DIAGNOSIS — J449 Chronic obstructive pulmonary disease, unspecified: Secondary | ICD-10-CM | POA: Diagnosis not present

## 2024-02-10 DIAGNOSIS — R911 Solitary pulmonary nodule: Secondary | ICD-10-CM

## 2024-02-10 DIAGNOSIS — J90 Pleural effusion, not elsewhere classified: Secondary | ICD-10-CM | POA: Diagnosis not present

## 2024-02-10 DIAGNOSIS — Z87891 Personal history of nicotine dependence: Secondary | ICD-10-CM | POA: Diagnosis not present

## 2024-02-10 DIAGNOSIS — I700 Atherosclerosis of aorta: Secondary | ICD-10-CM | POA: Diagnosis not present

## 2024-02-10 MED ORDER — DOXYCYCLINE HYCLATE 100 MG PO TABS: 100.0000 mg | ORAL_TABLET | Freq: Two times a day (BID) | ORAL | 0 refills | Status: AC

## 2024-02-10 MED ORDER — PREDNISONE 10 MG PO TABS: ORAL_TABLET | ORAL | 0 refills | Status: AC

## 2024-02-10 NOTE — Telephone Encounter (Signed)
 Patient currently being seen by Sarah Groce

## 2024-02-10 NOTE — Patient Instructions (Signed)
 It is good to see you today. I am sorry you are feeling well.  I am sending in a prescription for Doxycycline .  This is an antibiotic.  Take one tablet in the morning and one in the evening x 7 days. Take until gone . Wear sunblock if you go outside, as Doxycycline  makes you hyper sensitive to the sun.  Take probiotic with antibiotics. I like Culturelle which you can get over the counter at any pharmacy. Take once daily while on antibiotics.  I have also called in a prednisone  taper.  Prednisone  taper; 10 mg tablets: 4 tabs x 2 days, 3 tabs x 2 days, 2 tabs x 2 days 1 tab x 2 days then stop.  Please monitor your oxygen levels at home. If they drop below 88% and do not rebound, please seek emergency care, or call to be seen.  CXR today in the office.  Continue Breztri  twice daily as you have been doing. Rinse mouth after use. Use Lovobuterol in the morning and the evening until you are feeling better.  Rest, and hydration , call if you need us .  Call us  if you are not better after treatment, or get worse, or seek emergency care.  Please contact office for sooner follow up if symptoms do not improve or worsen or seek emergency care

## 2024-02-10 NOTE — Telephone Encounter (Signed)
 FYI Only or Action Required?: FYI only for provider.  Patient is followed in Pulmonology for COPD, last seen on 01/15/2024 by Malachy Comer GAILS, NP.  Called Nurse Triage reporting Shortness of Breath.  Symptoms began a week ago.  Symptoms are: gradually worsening.  Triage Disposition: See HCP Within 4 Hours (Or PCP Triage)  Patient/caregiver understands and will follow disposition?: Yes     Copied from CRM [id number]. Topic: Clinical - Red Word Triage >> Feb 10, 2024 10:56 AM Essie A wrote: Red Word that prompted transfer to Nurse Triage: Patient has chest congestion, hard time breathing, headache.  Had symptoms for over a week.  He has COPD.     Reason for Disposition  [1] Longstanding difficulty breathing (e.g., CHF, COPD, emphysema) AND [2] WORSE than normal  Answer Assessment - Initial Assessment Questions 1. RESPIRATORY STATUS: Describe your breathing? (e.g., wheezing, shortness of breath, unable to speak, severe coughing)      Shortness of breath  2. ONSET: When did this breathing problem begin?      1 week ago  3. PATTERN Does the difficult breathing come and go, or has it been constant since it started?      Constant  4. SEVERITY: How bad is your breathing? (e.g., mild, moderate, severe)      Moderate  5. RECURRENT SYMPTOM: Have you had difficulty breathing before? If Yes, ask: When was the last time? and What happened that time?      Yes 6. CARDIAC HISTORY: Do you have any history of heart disease? (e.g., heart attack, angina, bypass surgery, angioplasty)      Yes 7. LUNG HISTORY: Do you have any history of lung disease?  (e.g., pulmonary embolus, asthma, emphysema)     Yes 8. CAUSE: What do you think is causing the breathing problem?      Had a head cold that went to his chest  9. OTHER SYMPTOMS: Do you have any other symptoms? (e.g., chest pain, cough, dizziness, fever, runny nose)     Runny nose 10. O2 SATURATION MONITOR:  Do you use an  oxygen saturation monitor (pulse oximeter) at home? If Yes, ask: What is your reading (oxygen level) today? What is your usual oxygen saturation reading? (e.g., 95%)       Has not checked  Protocols used: Breathing Difficulty-A-AH

## 2024-02-10 NOTE — Progress Notes (Signed)
 History of Present Illness Glen [name] is a 83 y.o. male  former smoker followed for lung nodule, COPD mixed type, and OSA on CPAP. He is a patient of Dr. Saundra and previously followed by Dr. Brenna for pulmonary nodule. Past medical history significant for HTN, CAD, allergic rhinitis, HLD. He was last seen by Glen Rouleau NP.  Breztri  Maintenance  Albuterol  Rescue Albuterol  nebs    02/10/2024 Discussed the use of AI scribe software for clinical note transcription with the patient, who gave verbal consent to proceed.  History of Present Illness Glen [name] is an 83 year old male with COPD and coronary artery disease who presents with worsening shortness of breath and cough. He presents today for an acute office visit.  He became ill a week ago Saturday with symptoms of a cold that progressed to his chest, exacerbating his COPD and causing difficulty breathing. He describes his cough as producing light yellow sputum and reports some wheezing. No fever but mentions feeling cold, possibly due to air conditioning. No swelling in his ankles but does have neuropathy. He monitors his oxygen levels at home.His oxygen saturation is 92% today on RA. His respiratory rate is 20, and increases with speech. He can complete full sentences without problem.   He is currently using albuterol  every five to six hours, Breztri , twice daily and a nebulizer with levobunolol as needed. He has a large supply of levobunolol, which he uses as needed, particularly when engaging in activities like climbing stairs.   His past medical history includes COPD, sleep apnea, lung nodules (previously malignant but resolved following radiation therapy in 2023), hypertension, coronary artery disease, allergic rhinitis, and colon cancer in 2020, which has been treated. He is cautious about gastrointestinal upset due to his history of colon cancer and prefers to use probiotic tablets over yogurt.  We did a Covid  Test which was negative.  We will treat with antibiotic, and prednisone  taper.  I will have patient continue his Breztri  twice daily and we will use Xopenex  at least twice daily in the morning in the evening.  I have asked the patient to monitor his oxygen saturations at home.  He does have an oxygen saturation meter there.  I explained if his oxygen levels drop below 88% and do not rebound quickly he needs to seek emergency care.  We will do a chest x-ray today to ensure that there is not a pneumonia on top of his COPD.  I told him I would call him with results.      Test Results: TEST/EVENTS:  02/26/2013 PFT: FVC 70, FEV1 46, ratio 52, TLC 93, DLCOunc 87 corrects to normal for alveolar volume  09/23/2022 CT chest wo contrast: atherosclerosis. Centrilobular emphysema. Chronic bandlike scarring in the RUL from previous radiation therapy, unchanged. Stable 3 mm RUL nodule, unchanged and considered benign. Chronic idiopathic skeletal hyperostosis.  03/28/2023 CT chest wo contrast: trace pericardial fluid. Centrilobular emphysematous changes. Bandlike opacity in right midlung, similar. Persistent reticulonodular changes in RUL. Post treatment changes. No new dominant mass. Stable 3 mm RUL nodule. Fatty liver.  09/30/2023 CT chest w con: atherosclerosis. Emphysema. RUL post radiation changes. RUL mild interstitial thickening, unchanged. Similar findings more superiorly and laterally. Millimetric nodules identified in RUL, LLL. Spondylosis   CXR [id number]/[id number]      Latest Ref Rng & Units 07/25/2018    3:30 AM 07/23/2018    2:25 AM 07/22/2018    3:32 AM  CBC  WBC 4.0 -  10.5 K/uL 5.4  10.2  13.1   Hemoglobin 13.0 - 17.0 g/dL 8.9  9.6  88.2   Hematocrit 39.0 - 52.0 % 27.7  30.8  35.5   Platelets 150 - 400 K/uL 245  232  293        Latest Ref Rng & Units 12/07/2021    8:35 AM 09/11/2021   11:43 AM 07/25/2018    3:30 AM  BMP  Glucose 70 - 99 mg/dL  878  892   BUN 8 - 23 mg/dL  21  <5   Creatinine 9.38 -  1.24 mg/dL 8.89  8.87  9.18   Sodium 135 - 145 mmol/L  137  141   Potassium 3.5 - 5.1 mmol/L  4.5  3.3   Chloride 98 - 111 mmol/L  105  105   CO2 22 - 32 mmol/L  26  29   Calcium 8.9 - 10.3 mg/dL  9.5  8.1     BNP No results found for: BNP  ProBNP No results found for: PROBNP  PFT No results found for: FEV1PRE, FEV1POST, FVCPRE, FVCPOST, TLC, DLCOUNC, PREFEV1FVCRT, PSTFEV1FVCRT  No results found.   Past medical hx Past Medical History:  Diagnosis Date   Allergic rhinitis, cause unspecified    Anxiety    Asthma    Cancer (HCC) 07/21/2018   colon   COPD (chronic obstructive pulmonary disease) (HCC)    Depression    Dyspnea    upon exertion   GERD (gastroesophageal reflux disease)    Hypertension    Pre-diabetes    PUD (peptic ulcer disease)    avoids aspirin   Sleep apnea    uses C-PAP     Social History   Tobacco Use   Smoking status: Former    Current packs/day: 0.00    Types: Cigarettes    Quit date: 04/12/2011    Years since quitting: 12.8    Passive exposure: Never   Smokeless tobacco: Never  Vaping Use   Vaping status: Never Used  Substance Use Topics   Alcohol use: Yes    Alcohol/week: 0.0 - 6.0 standard drinks of alcohol    Comment: 1 liquor drink, 1 glass wine eveyr 2-3 weeks   Drug use: No    Glen [name] reports that he quit smoking about 12 years ago. His smoking use included cigarettes. He has never been exposed to tobacco smoke. He has never used smokeless tobacco. He reports current alcohol use. He reports that he does not use drugs.  Tobacco Cessation: Counseling given: Not Answered Former smoker , quit 2012  Past surgical hx, Family hx, Social hx all reviewed.  Current Outpatient Medications on File Prior to Visit  Medication Sig   acetaminophen  (TYLENOL ) 650 MG CR tablet Take 1,300 mg by mouth at bedtime. Will take in am prn as well   albuterol  (PROAIR  HFA) 108 (90 Base) MCG/ACT inhaler Inhale 2 puffs into the  lungs every 6 (six) hours as needed for wheezing or shortness of breath. Inhale 2 puffs every 6 hours as needed   ALPRAZolam  (XANAX ) 0.25 MG tablet Take 0.25 mg by mouth daily as needed for anxiety.    amLODipine  (NORVASC ) 10 MG tablet Take 10 mg by mouth daily.   aspirin EC 81 MG tablet 1 tablet   atorvastatin  (LIPITOR) 40 MG tablet Take 40 mg by mouth at bedtime.    Budeson-Glycopyrrol-Formoterol (BREZTRI  AEROSPHERE) 160-9-4.8 MCG/ACT AERO INHALE 2 PUFFS INTO THE LUNGS IN THE MORNING AND AT BEDTIME  cetirizine (ZYRTEC) 10 MG chewable tablet Chew 10 mg by mouth daily.   Cyanocobalamin (VITAMIN B12) 1000 MCG TBCR 1 tablet   ezetimibe  (ZETIA ) 10 MG tablet Take 1 tablet (10 mg total) by mouth daily.   ferrous sulfate 325 (65 FE) MG tablet Take 325 mg by mouth daily.   fluticasone  (FLONASE) 50 MCG/ACT nasal spray Place 2 sprays into both nostrils daily as needed for allergies.   gabapentin  (NEURONTIN ) 300 MG capsule TAKE 2 CAPSULES(600 MG) BY MOUTH AT BEDTIME   hydrochlorothiazide  (MICROZIDE ) 12.5 MG capsule Take 12.5 mg by mouth daily.    levalbuterol  (XOPENEX ) 0.63 MG/3ML nebulizer solution Use 1 vial via nebulizer every 4 hours as needed for wheezing or shortness of breath.   mupirocin ointment (BACTROBAN) 2 % Apply 1 Application topically 2 (two) times daily.   omeprazole  (PRILOSEC) 20 MG capsule Take 20 mg by mouth daily.   Polyethylene Glycol 3350  (MIRALAX PO) Take 17 g by mouth daily.   primidone  (MYSOLINE ) 50 MG tablet TAKE 2 TABLETS BY MOUTH IN THE MORNING AND 1 TABLET IN THE EVENING   sertraline  (ZOLOFT ) 100 MG tablet Take 100 mg by mouth daily.    telmisartan  (MICARDIS ) 40 MG tablet Take 40 mg by mouth daily.   No current facility-administered medications on file prior to visit.     Allergies  Allergen Reactions   Aspirin Other (See Comments)    pt has bleeding ulcers   Lisinopril Cough   Nsaids     Other reaction(s): GI upset   Other     GI Upset (intolerance)  AVOID  NSAIDS    Review Of Systems:  Constitutional:   No  weight loss, night sweats,  Fevers, chills, fatigue, or  lassitude.  HEENT:   No headaches,  Difficulty swallowing,  Tooth/dental problems, or  Sore throat,                No sneezing, itching, ear ache, nasal congestion, post nasal drip,   CV:  No chest pain,  Orthopnea, PND, swelling in lower extremities, anasarca, dizziness, palpitations, syncope.   GI  No heartburn, indigestion, abdominal pain, nausea, vomiting, diarrhea, change in bowel habits, loss of appetite, bloody stools.   Resp: + shortness of breath with exertion less at rest.  No excess mucus, + productive cough,  No non-productive cough,  No coughing up of blood.  No change in color of mucus.  + wheezing.  No chest wall deformity  Skin: no rash or lesions.  GU: no dysuria, change in color of urine, no urgency or frequency.  No flank pain, no hematuria   MS:  No joint pain or swelling.  No decreased range of motion.  No back pain.  Psych:  No change in mood or affect. No depression or anxiety.  No memory loss.   Vital Signs BP 129/69   Pulse 96   Temp 98.3 F (36.8 C) (Oral)   Ht 5' 7 (1.702 m)   Wt 189 lb 9.6 oz (86 kg)   SpO2 92%   BMI 29.70 kg/m    Physical Exam:  General- No distress,  A&Ox3, pleasant ENT: No sinus tenderness, TM clear, pale nasal mucosa, no oral exudate,no post nasal drip, no LAN Cardiac: S1, S2, regular rate and rhythm, no murmur Chest: No wheeze/ rales/ dullness; no accessory muscle use, no nasal flaring, no sternal retractions, + cough Abd.: Soft ,Non-tender, ND, BS +,Body mass index is 29.7 kg/m.  Ext: No clubbing cyanosis, edema, no lower  extremity edema Neuro:  normal strength, MAE x 4, A&O x 3 Skin: No rashes, warm and dry, no obvious skin lesions  Psych: normal mood and behavior    Assessment & Plan COPD with acute exacerbation Acute exacerbation likely due to viral infection. Concerns about post-viral pneumonia. -  Prescribed prednisone  taper:  - Prescribed doxycycline  for potential post-viral pneumonia. Advised sunblock use. - Continue Breztri  regimen. - Levalbuterol  nebulizer twice daily, additional doses PRN for dyspnea. - Ordered stat chest x-ray for pneumonia evaluation. - Advised home oxygen saturation monitoring, seek care if <88%. - Recommended Culturelle probiotic to prevent GI upset from antibiotics. - Advised rest and hydration. - Instructed to seek emergency care if symptoms worsen or persist.  AVS 02/10/2024 I am sorry you are feeling well.  I am sending in a prescription for Doxycycline .  This is an antibiotic.  Take one tablet in the morning and one in the evening x 7 days. Take until gone . Wear sunblock if you go outside, as Doxycycline  makes you hyper sensitive to the sun.  Take probiotic with antibiotics. I like Culturelle which you can get over the counter at any pharmacy. Take once daily while on antibiotics.  I have also called in a prednisone  taper.  Prednisone  taper; 10 mg tablets: 4 tabs x 2 days, 3 tabs x 2 days, 2 tabs x 2 days 1 tab x 2 days then stop.  Please monitor your oxygen levels at home. If they drop below 88% and do not rebound, please seek emergency care, or call to be seen.  CXR today in the office.  Continue Breztri  twice daily as you have been doing. Rinse mouth after use. Use Lovobuterol in the morning and the evening until you are feeling better.  Rest, and hydration , call if you need us .  Call us  if you are not better after treatment, or get worse, or seek emergency care.  Please contact office for sooner follow up if symptoms do not improve or worsen or seek emergency care    I spent 35 minutes dedicated to the care of this patient on the date of this encounter to include pre-visit review of records, face-to-face time with the patient discussing conditions above, post visit ordering of testing, clinical documentation with the electronic health record,  making appropriate referrals as documented, and communicating necessary information to the patient's healthcare team.   Lauraine JULIANNA Lites, NP 02/10/2024  11:41 AM

## 2024-02-17 ENCOUNTER — Ambulatory Visit: Admitting: Acute Care

## 2024-02-17 ENCOUNTER — Encounter: Payer: Self-pay | Admitting: Acute Care

## 2024-02-17 VITALS — BP 135/69 | HR 71 | Temp 97.9°F | Ht 67.5 in | Wt 190.4 lb

## 2024-02-17 DIAGNOSIS — J302 Other seasonal allergic rhinitis: Secondary | ICD-10-CM | POA: Diagnosis not present

## 2024-02-17 DIAGNOSIS — J441 Chronic obstructive pulmonary disease with (acute) exacerbation: Secondary | ICD-10-CM

## 2024-02-17 DIAGNOSIS — Z87891 Personal history of nicotine dependence: Secondary | ICD-10-CM | POA: Diagnosis not present

## 2024-02-17 DIAGNOSIS — R0609 Other forms of dyspnea: Secondary | ICD-10-CM

## 2024-02-17 NOTE — Progress Notes (Signed)
 History of Present Illness Glen [name] is a 83 y.o. male former smoker followed for lung nodule, COPD mixed type, and OSA on CPAP. He is a patient of Dr. Saundra and previously followed by Dr. Brenna for pulmonary nodule. Past medical history significant for HTN, CAD, allergic rhinitis, HLD.   Breztri  Maintenance  Albuterol  Rescue Xopenex   nebs    02/17/2024 Discussed the use of AI scribe software for clinical note transcription with the patient, who gave verbal consent to proceed.  History of Present Illness Pt. Presents for a slow to resolve cold that progressed to his chest.He was seen  01/15/2024, and was treated with Doxycycline , and a prednisone  taper. He states he is much better. He does have an occasional cough, and post nasal drip[ and allergies. He is currently using Benadryl and Tylenol , and plans to transition back to his Zyrtec.  Secretions are pale yellow to white. No more green secretions.No fever, much better energy.    He completed the  prednisone  taper and doxycycline  course. He monitors his oxygen levels, which have not dropped below 92%, even with exertion. He notes improvement in his breathing and less fatigue, stating he can now go up and down stairs with less shortness of breath. He continues to use Breztri , two puffs twice a day, and monitors his oxygen levels. He mentions feeling 'foggy headed' and tired previously, but these symptoms have improved.  As he is doing well, we will have him follow up as needed for any additional respiratory issues.      Test Results:     Latest Ref Rng & Units 07/25/2018    3:30 AM 07/23/2018    2:25 AM 07/22/2018    3:32 AM  CBC  WBC 4.0 - 10.5 K/uL 5.4  10.2  13.1   Hemoglobin 13.0 - 17.0 g/dL 8.9  9.6  88.2   Hematocrit 39.0 - 52.0 % 27.7  30.8  35.5   Platelets 150 - 400 K/uL 245  232  293        Latest Ref Rng & Units 12/07/2021    8:35 AM 09/11/2021   11:43 AM 07/25/2018    3:30 AM  BMP  Glucose 70 - 99 mg/dL  878   892   BUN 8 - 23 mg/dL  21  <5   Creatinine 9.38 - 1.24 mg/dL 8.89  8.87  9.18   Sodium 135 - 145 mmol/L  137  141   Potassium 3.5 - 5.1 mmol/L  4.5  3.3   Chloride 98 - 111 mmol/L  105  105   CO2 22 - 32 mmol/L  26  29   Calcium 8.9 - 10.3 mg/dL  9.5  8.1     BNP No results found for: BNP  ProBNP No results found for: PROBNP  PFT No results found for: FEV1PRE, FEV1POST, FVCPRE, FVCPOST, TLC, DLCOUNC, PREFEV1FVCRT, PSTFEV1FVCRT  DG Chest 2 View Result Date: 02/13/2024 EXAM: 2 VIEW(S) XRAY OF THE CHEST 02/10/2024 12:16:57 PM COMPARISON: 09/11/2021. 09/30/2023. CLINICAL HISTORY: sob FINDINGS: LUNGS AND PLEURA: Surgical clips in right upper lobe. Linear scarring or atelectasis in right mid lung. Blunting of right costophrenic angle, consistent with a small right pleural effusion. No pulmonary edema. No pneumothorax. HEART AND MEDIASTINUM: No acute abnormality of the cardiac and mediastinal silhouettes. Aortic calcifications. BONES AND SOFT TISSUES: Degenerative changes in the thoracic spine confluent anterior osteophytes suggestive of DISH. No acute osseous abnormality. IMPRESSION: 1. Small right pleural effusion. 2. Possible left upper lobe nodule.  3. Scarring/atelectasis in the right mid lung. 4. Chronic interstitial opacities. Electronically signed by: Donnice Mania MD 02/13/2024 11:22 PM EDT RP Workstation: [id number]     Past medical hx Past Medical History:  Diagnosis Date   Allergic rhinitis, cause unspecified    Anxiety    Asthma    Cancer (HCC) 07/21/2018   colon   COPD (chronic obstructive pulmonary disease) (HCC)    Depression    Dyspnea    upon exertion   GERD (gastroesophageal reflux disease)    Hypertension    Pre-diabetes    PUD (peptic ulcer disease)    avoids aspirin   Sleep apnea    uses C-PAP     Social History   Tobacco Use   Smoking status: Former    Current packs/day: 0.00    Types: Cigarettes    Quit date: 04/12/2011    Years  since quitting: 12.8    Passive exposure: Never   Smokeless tobacco: Never  Vaping Use   Vaping status: Never Used  Substance Use Topics   Alcohol use: Yes    Alcohol/week: 0.0 - 6.0 standard drinks of alcohol    Comment: 1 liquor drink, 1 glass wine eveyr 2-3 weeks   Drug use: No    Glen [name] reports that he quit smoking about 12 years ago. His smoking use included cigarettes. He has never been exposed to tobacco smoke. He has never used smokeless tobacco. He reports current alcohol use. He reports that he does not use drugs.  Tobacco Cessation: Counseling given: Not Answered Former smoker , quit 2012  Past surgical hx, Family hx, Social hx all reviewed.  Current Outpatient Medications on File Prior to Visit  Medication Sig   acetaminophen  (TYLENOL ) 650 MG CR tablet Take 1,300 mg by mouth at bedtime. Will take in am prn as well   albuterol  (PROAIR  HFA) 108 (90 Base) MCG/ACT inhaler Inhale 2 puffs into the lungs every 6 (six) hours as needed for wheezing or shortness of breath. Inhale 2 puffs every 6 hours as needed   ALPRAZolam  (XANAX ) 0.25 MG tablet Take 0.25 mg by mouth daily as needed for anxiety.    amLODipine  (NORVASC ) 10 MG tablet Take 10 mg by mouth daily.   aspirin EC 81 MG tablet 1 tablet   atorvastatin  (LIPITOR) 40 MG tablet Take 40 mg by mouth at bedtime.    Budeson-Glycopyrrol-Formoterol (BREZTRI  AEROSPHERE) 160-9-4.8 MCG/ACT AERO INHALE 2 PUFFS INTO THE LUNGS IN THE MORNING AND AT BEDTIME   cetirizine (ZYRTEC) 10 MG chewable tablet Chew 10 mg by mouth daily.   Cyanocobalamin (VITAMIN B12) 1000 MCG TBCR 1 tablet   doxycycline  (VIBRA -TABS) 100 MG tablet Take 1 tablet (100 mg total) by mouth 2 (two) times daily.   ezetimibe  (ZETIA ) 10 MG tablet Take 1 tablet (10 mg total) by mouth daily.   ferrous sulfate 325 (65 FE) MG tablet Take 325 mg by mouth daily.   fluticasone  (FLONASE) 50 MCG/ACT nasal spray Place 2 sprays into both nostrils daily as needed for allergies.    gabapentin  (NEURONTIN ) 300 MG capsule TAKE 2 CAPSULES(600 MG) BY MOUTH AT BEDTIME   hydrochlorothiazide  (MICROZIDE ) 12.5 MG capsule Take 12.5 mg by mouth daily.    levalbuterol  (XOPENEX ) 0.63 MG/3ML nebulizer solution Use 1 vial via nebulizer every 4 hours as needed for wheezing or shortness of breath.   mupirocin ointment (BACTROBAN) 2 % Apply 1 Application topically 2 (two) times daily.   omeprazole  (PRILOSEC) 20 MG capsule Take 20 mg by mouth  daily.   Polyethylene Glycol 3350  (MIRALAX PO) Take 17 g by mouth daily.   predniSONE  (DELTASONE ) 10 MG tablet Prednisone  taper; 10 mg tablets: 4 tabs x 2 days, 3 tabs x 2 days, 2 tabs x 2 days 1 tab x 2 days then stop.   primidone  (MYSOLINE ) 50 MG tablet TAKE 2 TABLETS BY MOUTH IN THE MORNING AND 1 TABLET IN THE EVENING   sertraline  (ZOLOFT ) 100 MG tablet Take 100 mg by mouth daily.    telmisartan  (MICARDIS ) 40 MG tablet Take 40 mg by mouth daily.   No current facility-administered medications on file prior to visit.     Allergies  Allergen Reactions   Aspirin Other (See Comments)    pt has bleeding ulcers   Lisinopril Cough   Nsaids     Other reaction(s): GI upset   Other     GI Upset (intolerance)  AVOID NSAIDS    Review Of Systems:  Constitutional:   No  weight loss, night sweats,  Fevers, chills, fatigue, or  lassitude.  HEENT:   No headaches,  Difficulty swallowing,  Tooth/dental problems, or  Sore throat,                No sneezing, itching, ear ache, +nasal congestion,+ post nasal drip,   CV:  No chest pain,  Orthopnea, PND, swelling in lower extremities, anasarca, dizziness, palpitations, syncope.   GI  No heartburn, indigestion, abdominal pain, nausea, vomiting, diarrhea, change in bowel habits, loss of appetite, bloody stools.   Resp: + shortness of breath with exertion none  at rest.  + excess mucus, but better,+ productive cough, but better, No non-productive cough,  No coughing up of blood.  No change in color of mucus.  No  wheezing.  No chest wall deformity  Skin: no rash or lesions.  GU: no dysuria, change in color of urine, no urgency or frequency.  No flank pain, no hematuria   MS:  No joint pain or swelling.  No decreased range of motion.  No back pain.  Psych:  No change in mood or affect. No depression or anxiety.  No memory loss.   Vital Signs BP 135/69   Pulse 71   Temp 97.9 F (36.6 C) (Oral)   Ht 5' 7.5 (1.715 m)   Wt 190 lb 6.4 oz (86.4 kg)   SpO2 95%   BMI 29.38 kg/m    Physical Exam:  General- No distress,  A&Ox3, pleasant ENT: No sinus tenderness, TM clear, pale nasal mucosa, no oral exudate,no post nasal drip, no LAN Cardiac: S1, S2, regular rate and rhythm, no murmur Chest: No wheeze/ rales/ dullness; no accessory muscle use, no nasal flaring, no sternal retractions Abd.: Soft Non-tender, ND, BS +, Body mass index is 29.38 kg/m.  Ext: No clubbing cyanosis, edema, no obvious deformities Neuro:  normal strength, MAE x 4, A&O x 3 Skin: No rashes, warm and dry, o obvious skin lesions  Psych: normal mood and behavior   Assessment/Plan  Assessment and Plan Assessment & Plan Chronic obstructive pulmonary disease (COPD) COPD with cough, sputum production, and exertional dyspnea. Oxygen saturation remains above 92%.  Symptoms improved with rest, hydration, and antibiotics. - Continue Breztri  two puffs twice daily. - Monitor oxygen levels and call if he drops < 88% - Use labetalol twice daily or as needed for breakthrough shortness of breath. - Call if symptoms worsen. - Call if you need us  .   Allergic rhinitis Allergic rhinitis with postnasal drip. Benadryl  causes drowsiness. - Continue Benadryl for another week. - Transition back to Zyrtec within a week.    I spent 15 minutes dedicated to the care of this patient on the date of this encounter to include pre-visit review of records, face-to-face time with the patient discussing conditions above, post visit ordering of  testing, clinical documentation with the electronic health record, making appropriate referrals as documented, and communicating necessary information to the patient's healthcare team.    Lauraine JULIANNA Lites, NP 02/17/2024  3:39 PM

## 2024-02-17 NOTE — Patient Instructions (Addendum)
 It is good to see you today. I am glad you are feeling better today. - Continue Breztri  regimen. - Rinse mouth after use. - Levalbuterol  nebulizer twice daily, additional doses PRN for dyspnea. - Advised home oxygen saturation monitoring, seek care if <88%.  -Transition back to Zyrtec within the week, or when you feel you can do so with your allergies.  -Call us  if you need us  sooner  -Please contact office for sooner follow up if symptoms do not improve or worsen or seek emergency care

## 2024-02-20 ENCOUNTER — Other Ambulatory Visit: Payer: Self-pay | Admitting: Neurology

## 2024-02-20 DIAGNOSIS — G609 Hereditary and idiopathic neuropathy, unspecified: Secondary | ICD-10-CM

## 2024-02-20 DIAGNOSIS — G250 Essential tremor: Secondary | ICD-10-CM

## 2024-02-26 ENCOUNTER — Telehealth: Payer: Self-pay

## 2024-02-26 DIAGNOSIS — J449 Chronic obstructive pulmonary disease, unspecified: Secondary | ICD-10-CM

## 2024-02-26 MED ORDER — BREZTRI AEROSPHERE 160-9-4.8 MCG/ACT IN AERO: 2.0000 | INHALATION_SPRAY | Freq: Two times a day (BID) | RESPIRATORY_TRACT | 11 refills | Status: AC

## 2024-02-26 NOTE — Telephone Encounter (Signed)
 Received refill fax request,sent in.NFN

## 2024-02-27 ENCOUNTER — Encounter (HOSPITAL_COMMUNITY)

## 2024-02-27 ENCOUNTER — Ambulatory Visit

## 2024-02-27 ENCOUNTER — Other Ambulatory Visit: Payer: Self-pay | Admitting: Neurology

## 2024-02-27 DIAGNOSIS — G250 Essential tremor: Secondary | ICD-10-CM

## 2024-02-27 NOTE — Progress Notes (Unsigned)
 Assessment/Plan:    1.  Essential Tremor  -Tremor increasing somewhat due to stress from sisters estate.  However, he does think he would like to increase primidone  and we will do this.  -increase primidone  50 mg, 2 in the AM, 2 in the evening.    2.  Diplopia, likely the result of an ischemic event, September, 2021  -Now resolved  -Patient's MRI brain with white matter disease and moderate stenosis of the right MCA.  -MRA neck with 50% stenosis of the right carotid (note that carotid ultrasound had been estimating this much higher)  -Patient on Lipitor, 40 mg daily.  LDL was at goal at the time of the event  -Patient on aspirin, 81 mg daily.  3.  Peripheral neuropathy  - Tried to go down to 300 mg at night and symptoms worse.  Somehow morning dose of neurontin  got dropped since last visit.  Will increase back to the 100 mg in the AM and 600 mg at night (previously the 100 mg had gotten dropped somewhere)  4.  Carotid stenosis, right  -Followed by vascular surgery.   5.  B12 deficiency  -on oral supplementation  6.  Lung cancer, stage I  -Underwent only radiation therapy and currently no evidence of recurrent disease.  7.  F/u 1 year Subjective:   CORON ROSSANO was seen today in follow up for essential tremor.  My previous records were reviewed prior to todays visit.  Patient was last seen about 1 year ago.  He is still on his primidone  and tolerating it well. Tremor is off and on.  He has had a post viral cough and took prednisone  and abx and had worsened tremor recently - my wife had to carry my coffee cup this morning.  Also increased stress - sister died in Oct 27, 2023 and he has been trying to clear out her apartment.   He has had no near syncope since last visit.  He is still on the gabapentin  for the PN but somewhere the 100 mg in the AM got dropped and he asks about that.  Current prescribed movement disorder medications: Primidone , 50 mg 2 in the morning, 1 in the  evening Gabapentin , 100 mg in the morning, 600 mg at night   ALLERGIES:   Allergies  Allergen Reactions   Aspirin Other (See Comments)    pt has bleeding ulcers   Lisinopril Cough   Nsaids     Other reaction(s): GI upset   Other     GI Upset (intolerance)  AVOID NSAIDS    CURRENT MEDICATIONS:  Outpatient Encounter Medications as of 03/02/2024  Medication Sig   acetaminophen  (TYLENOL ) 650 MG CR tablet Take 1,300 mg by mouth at bedtime. Will take in am prn as well   albuterol  (PROAIR  HFA) 108 (90 Base) MCG/ACT inhaler Inhale 2 puffs into the lungs every 6 (six) hours as needed for wheezing or shortness of breath. Inhale 2 puffs every 6 hours as needed   ALPRAZolam  (XANAX ) 0.25 MG tablet Take 0.25 mg by mouth daily as needed for anxiety.    amLODipine  (NORVASC ) 10 MG tablet Take 10 mg by mouth daily.   aspirin EC 81 MG tablet 1 tablet   atorvastatin  (LIPITOR) 40 MG tablet Take 40 mg by mouth at bedtime.    budesonide-glycopyrrolate -formoterol (BREZTRI  AEROSPHERE) 160-9-4.8 MCG/ACT AERO inhaler Inhale 2 puffs into the lungs in the morning and at bedtime.   cetirizine (ZYRTEC) 10 MG chewable tablet Chew 10 mg by  mouth daily.   Cyanocobalamin (VITAMIN B12) 1000 MCG TBCR 1 tablet   doxycycline  (VIBRA -TABS) 100 MG tablet Take 1 tablet (100 mg total) by mouth 2 (two) times daily.   ferrous sulfate 325 (65 FE) MG tablet Take 325 mg by mouth daily.   fluticasone  (FLONASE) 50 MCG/ACT nasal spray Place 2 sprays into both nostrils daily as needed for allergies.   gabapentin  (NEURONTIN ) 100 MG capsule Take 1 capsule (100 mg total) by mouth every morning.   gabapentin  (NEURONTIN ) 300 MG capsule TAKE 2 CAPSULES(600 MG) BY MOUTH AT BEDTIME   hydrochlorothiazide  (MICROZIDE ) 12.5 MG capsule Take 12.5 mg by mouth daily.    levalbuterol  (XOPENEX ) 0.63 MG/3ML nebulizer solution Use 1 vial via nebulizer every 4 hours as needed for wheezing or shortness of breath.   mupirocin ointment (BACTROBAN) 2 %  Apply 1 Application topically 2 (two) times daily.   omeprazole  (PRILOSEC) 20 MG capsule Take 20 mg by mouth daily.   Polyethylene Glycol 3350  (MIRALAX PO) Take 17 g by mouth daily.   predniSONE  (DELTASONE ) 10 MG tablet Prednisone  taper; 10 mg tablets: 4 tabs x 2 days, 3 tabs x 2 days, 2 tabs x 2 days 1 tab x 2 days then stop.   sertraline  (ZOLOFT ) 100 MG tablet Take 100 mg by mouth daily.    telmisartan  (MICARDIS ) 40 MG tablet Take 40 mg by mouth daily.   [DISCONTINUED] primidone  (MYSOLINE ) 50 MG tablet TAKE 2 TABLETS BY MOUTH IN THE MORNING AND 1 TABLET IN THE EVENING   ezetimibe  (ZETIA ) 10 MG tablet Take 1 tablet (10 mg total) by mouth daily. (Patient not taking: Reported on 03/02/2024)   primidone  (MYSOLINE ) 50 MG tablet Take 2 tablets (100 mg total) by mouth 2 (two) times daily.   [DISCONTINUED] primidone  (MYSOLINE ) 50 MG tablet TAKE 2 TABLETS BY MOUTH IN THE MORNING AND 1 TABLET IN THE EVENING   No facility-administered encounter medications on file as of 03/02/2024.     Objective:    PHYSICAL EXAMINATION:    VITALS:   Vitals:   03/02/24 0907  BP: 126/68  Pulse: 92  SpO2: 96%  Weight: 189 lb 3.2 oz (85.8 kg)   Wt Readings from Last 3 Encounters:  03/02/24 189 lb 3.2 oz (85.8 kg)  02/17/24 190 lb 6.4 oz (86.4 kg)  02/10/24 189 lb 9.6 oz (86 kg)   GEN:  The patient appears stated age and is in NAD. HEENT:  Normocephalic, atraumatic.  Cv; RRR Lungs: Clear to auscultation bilaterally  Neurological examination:  Orientation: The patient is alert and oriented x3. Cranial nerves: There is good facial symmetry. The speech is fluent and clear. Soft palate rises symmetrically and there is no tongue deviation. Hearing is intact to conversational tone. Sensation: Sensation is intact to light touch throughout Motor: Strength is at least antigravity x4.  Movement examination: Tone: There is normal tone in the UE/LE Abnormal movements: he has no significant postural or intention  tremor today.  He has mild tremor with Archimedes spirals mostly on the L Coordination:  There is no decremation with RAM's Gait and Station: The patient ambulates well in the hall I have reviewed and interpreted the following labs independently   Chemistry      Component Value Date/Time   NA 137 09/11/2021 1143   K 4.5 09/11/2021 1143   CL 105 09/11/2021 1143   CO2 26 09/11/2021 1143   BUN 21 09/11/2021 1143   CREATININE 1.10 12/07/2021 0835      Component  Value Date/Time   CALCIUM 9.5 09/11/2021 1143   ALKPHOS 85 07/17/2018 0929   AST 19 07/17/2018 0929   ALT 14 07/17/2018 0929   BILITOT 0.6 07/17/2018 0929      Lab Results  Component Value Date   WBC 5.4 07/25/2018   HGB 8.9 (L) 07/25/2018   HCT 27.7 (L) 07/25/2018   MCV 93.3 07/25/2018   PLT 245 07/25/2018   Lab Results  Component Value Date   TSH 1.48 11/20/2015     Chemistry      Component Value Date/Time   NA 137 09/11/2021 1143   K 4.5 09/11/2021 1143   CL 105 09/11/2021 1143   CO2 26 09/11/2021 1143   BUN 21 09/11/2021 1143   CREATININE 1.10 12/07/2021 0835      Component Value Date/Time   CALCIUM 9.5 09/11/2021 1143   ALKPHOS 85 07/17/2018 0929   AST 19 07/17/2018 0929   ALT 14 07/17/2018 0929   BILITOT 0.6 07/17/2018 0929     Lab Results  Component Value Date   VITAMINB12 244 04/03/2018   Total time spent on today's visit was 30 minutes, including both face-to-face time and nonface-to-face time.  Time included that spent on review of records (prior notes available to me/labs/imaging if pertinent), discussing treatment and goals, answering patient's questions and coordinating care.    Cc:  Regino Slater, MD

## 2024-03-02 ENCOUNTER — Ambulatory Visit: Payer: Medicare PPO | Admitting: Neurology

## 2024-03-02 VITALS — BP 126/68 | HR 92 | Wt 189.2 lb

## 2024-03-02 DIAGNOSIS — G250 Essential tremor: Secondary | ICD-10-CM

## 2024-03-02 DIAGNOSIS — G609 Hereditary and idiopathic neuropathy, unspecified: Secondary | ICD-10-CM

## 2024-03-02 MED ORDER — GABAPENTIN 100 MG PO CAPS
100.0000 mg | ORAL_CAPSULE | Freq: Every morning | ORAL | 3 refills | Status: AC
Start: 2024-03-02 — End: ?

## 2024-03-02 MED ORDER — PRIMIDONE 50 MG PO TABS: 100.0000 mg | ORAL_TABLET | Freq: Two times a day (BID) | ORAL | 3 refills | Status: AC

## 2024-03-02 NOTE — Patient Instructions (Signed)
 Increase primidone  50 mg, 2 in the AM and 2 in the PM Take gabapentin  100 mg, 1 in the AM and take gabapentin  300 mg, 2 at bed  The physicians and staff at [facility] are committed to providing excellent care. You may receive a survey requesting feedback about your experience at our office. We strive to receive very good responses to the survey questions. If you feel that your experience would prevent you from giving the office a very good  response, please contact our office to try to remedy the situation. We may be reached at [phone]. Thank you for taking the time out of your busy day to complete the survey.

## 2024-03-03 DIAGNOSIS — F321 Major depressive disorder, single episode, moderate: Secondary | ICD-10-CM | POA: Diagnosis not present

## 2024-03-03 DIAGNOSIS — J449 Chronic obstructive pulmonary disease, unspecified: Secondary | ICD-10-CM | POA: Diagnosis not present

## 2024-03-03 DIAGNOSIS — Z85038 Personal history of other malignant neoplasm of large intestine: Secondary | ICD-10-CM | POA: Diagnosis not present

## 2024-03-03 DIAGNOSIS — Z79899 Other long term (current) drug therapy: Secondary | ICD-10-CM | POA: Diagnosis not present

## 2024-03-03 DIAGNOSIS — E611 Iron deficiency: Secondary | ICD-10-CM | POA: Diagnosis not present

## 2024-03-03 DIAGNOSIS — I10 Essential (primary) hypertension: Secondary | ICD-10-CM | POA: Diagnosis not present

## 2024-03-03 DIAGNOSIS — Z0001 Encounter for general adult medical examination with abnormal findings: Secondary | ICD-10-CM | POA: Diagnosis not present

## 2024-03-03 DIAGNOSIS — E7800 Pure hypercholesterolemia, unspecified: Secondary | ICD-10-CM | POA: Diagnosis not present

## 2024-03-03 DIAGNOSIS — R7303 Prediabetes: Secondary | ICD-10-CM | POA: Diagnosis not present

## 2024-03-03 DIAGNOSIS — K2270 Barrett's esophagus without dysplasia: Secondary | ICD-10-CM | POA: Diagnosis not present

## 2024-03-04 ENCOUNTER — Ambulatory Visit: Admitting: Physician Assistant

## 2024-03-04 ENCOUNTER — Ambulatory Visit (HOSPITAL_COMMUNITY)
Admission: RE | Admit: 2024-03-04 | Discharge: 2024-03-04 | Disposition: A | Discharge status: Home / Self Care | Source: Ambulatory Visit | Attending: Physician Assistant | Admitting: Physician Assistant

## 2024-03-04 VITALS — BP 125/66 | HR 71 | Temp 98.0°F | Wt 189.4 lb

## 2024-03-04 DIAGNOSIS — I6523 Occlusion and stenosis of bilateral carotid arteries: Secondary | ICD-10-CM | POA: Insufficient documentation

## 2024-03-04 NOTE — Progress Notes (Signed)
 Office Note   History of Present Illness   Glen [name] is a 83 y.o. (02/21/1941) male who presents for surveillance of carotid artery stenosis.  He has a history of asymptomatic right ICA 60 to 79% stenosis and left ICA 1 to 39% stenosis.  The patient returns today for follow up. He denies any recent strokelike symptoms such as slurred speech, facial droop, sudden visual changes, or sudden weakness/numbness.  He says he does have neuropathy and takes gabapentin  for this.  He also has essential tremor, which is managed by his neurologist.  Current Outpatient Medications  Medication Sig Dispense Refill   acetaminophen  (TYLENOL ) 650 MG CR tablet Take 1,300 mg by mouth at bedtime. Will take in am prn as well     albuterol  (PROAIR  HFA) 108 (90 Base) MCG/ACT inhaler Inhale 2 puffs into the lungs every 6 (six) hours as needed for wheezing or shortness of breath. Inhale 2 puffs every 6 hours as needed 17 each 5   ALPRAZolam  (XANAX ) 0.25 MG tablet Take 0.25 mg by mouth daily as needed for anxiety.      amLODipine  (NORVASC ) 10 MG tablet Take 10 mg by mouth daily.     aspirin EC 81 MG tablet 1 tablet     atorvastatin  (LIPITOR) 40 MG tablet Take 40 mg by mouth at bedtime.      budesonide-glycopyrrolate -formoterol (BREZTRI  AEROSPHERE) 160-9-4.8 MCG/ACT AERO inhaler Inhale 2 puffs into the lungs in the morning and at bedtime. 10.7 g 11   cetirizine (ZYRTEC) 10 MG chewable tablet Chew 10 mg by mouth daily.     Cyanocobalamin (VITAMIN B12) 1000 MCG TBCR 1 tablet     doxycycline  (VIBRA -TABS) 100 MG tablet Take 1 tablet (100 mg total) by mouth 2 (two) times daily. 14 tablet 0   ezetimibe  (ZETIA ) 10 MG tablet Take 1 tablet (10 mg total) by mouth daily. (Patient not taking: Reported on 03/02/2024) 15 tablet 0   ferrous sulfate 325 (65 FE) MG tablet Take 325 mg by mouth daily.     fluticasone  (FLONASE) 50 MCG/ACT nasal spray Place 2 sprays into both nostrils daily as needed for allergies.      gabapentin  (NEURONTIN ) 100 MG capsule Take 1 capsule (100 mg total) by mouth every morning. 90 capsule 3   gabapentin  (NEURONTIN ) 300 MG capsule TAKE 2 CAPSULES(600 MG) BY MOUTH AT BEDTIME 180 capsule 0   hydrochlorothiazide  (MICROZIDE ) 12.5 MG capsule Take 12.5 mg by mouth daily.      levalbuterol  (XOPENEX ) 0.63 MG/3ML nebulizer solution Use 1 vial via nebulizer every 4 hours as needed for wheezing or shortness of breath. 425 mL 3   mupirocin ointment (BACTROBAN) 2 % Apply 1 Application topically 2 (two) times daily.     omeprazole  (PRILOSEC) 20 MG capsule Take 20 mg by mouth daily.     Polyethylene Glycol 3350  (MIRALAX PO) Take 17 g by mouth daily.     predniSONE  (DELTASONE ) 10 MG tablet Prednisone  taper; 10 mg tablets: 4 tabs x 2 days, 3 tabs x 2 days, 2 tabs x 2 days 1 tab x 2 days then stop. 20 tablet 0   primidone  (MYSOLINE ) 50 MG tablet Take 2 tablets (100 mg total) by mouth 2 (two) times daily. 360 tablet 3   sertraline  (ZOLOFT ) 100 MG tablet Take 100 mg by mouth daily.   4   telmisartan  (MICARDIS ) 40 MG tablet Take 40 mg by mouth daily.     No current facility-administered medications for this visit.  REVIEW OF SYSTEMS (negative unless checked):   Cardiac:  []  Chest pain or chest pressure? []  Shortness of breath upon activity? []  Shortness of breath when lying flat? []  Irregular heart rhythm?  Vascular:  []  Pain in calf, thigh, or hip brought on by walking? []  Pain in feet at night that wakes you up from your sleep? []  Blood clot in your veins? []  Leg swelling?  Pulmonary:  []  Oxygen at home? []  Productive cough? []  Wheezing?  Neurologic:  []  Sudden weakness in arms or legs? []  Sudden numbness in arms or legs? []  Sudden onset of difficult speaking or slurred speech? []  Temporary loss of vision in one eye? []  Problems with dizziness?  Gastrointestinal:  []  Blood in stool? []  Vomited blood?  Genitourinary:  []  Burning when urinating? []  Blood in  urine?  Psychiatric:  []  Major depression  Hematologic:  []  Bleeding problems? []  Problems with blood clotting?  Dermatologic:  []  Rashes or ulcers?  Constitutional:  []  Fever or chills?  Ear/Nose/Throat:  []  Change in hearing? []  Nose bleeds? []  Sore throat?  Musculoskeletal:  []  Back pain? []  Joint pain? []  Muscle pain?   Physical Examination   Vitals:   03/04/24 0835  BP: 125/66  Pulse: 71  Temp: 98 F (36.7 C)  TempSrc: Temporal  Weight: 189 lb 6.4 oz (85.9 kg)   Body mass index is 29.23 kg/m.  General:  WDWN in NAD; vital signs documented above Gait: Not observed HENT: WNL, normocephalic Pulmonary: normal non-labored breathing  Cardiac: regular Abdomen: soft, NT, no masses Skin: without rashes Vascular Exam/Pulses: palpable radial pulses bilaterally Extremities: without ischemic changes, without gangrene , without cellulitis; without open wounds;  Musculoskeletal: no muscle wasting or atrophy  Neurologic: A&O X 3;  No focal weakness or paresthesias are detected Psychiatric:  The pt has Normal affect.  Non-Invasive Vascular Imaging   Bilateral Carotid Duplex (03/04/2024):  R ICA stenosis:  60-79% R VA:  patent and antegrade L ICA stenosis:  1-39% L VA:  patent and antegrade   Medical Decision Making   Glen [name] is a 83 y.o. male who presents for surveillance of carotid artery stenosis  Based on the patient's vascular studies, his carotid artery stenosis is unchanged bilaterally.  His right ICA stenosis is 60 to 79% and left ICA stenosis is 1 to 39%. He denies any strokelike symptoms such as slurred speech, facial droop, sudden visual changes, or sudden weakness/numbness. He has palpable and equal radial pulses bilaterally.  He has no neurological deficits on exam. He will continue his daily aspirin and statin and follow-up with our office in 6 months with repeat carotid duplex  Ahmed Holster PA-C [facility]  [place] Office: [phone]  Clinic MD: [place]

## 2024-03-08 ENCOUNTER — Other Ambulatory Visit: Payer: Self-pay

## 2024-03-08 DIAGNOSIS — I6523 Occlusion and stenosis of bilateral carotid arteries: Secondary | ICD-10-CM

## 2024-03-30 ENCOUNTER — Telehealth: Payer: Self-pay | Admitting: *Deleted

## 2024-03-30 NOTE — Telephone Encounter (Signed)
 CALLED PATIENT TO INFORM OF CT FOR 04-09-24- ARRIVAL TIME- 10:15 AM @ [facility], NO RESTRICTIONS TO SCAN, AND PATIENT TO RECEIVE RESULTS FROM ASHLYN BRUNING ON 04-14-24 @ 10:30. AM VIA TELEPHONE, SPOKE WITH PATIENT AND HE IS AWARE OF THESE APPTS. AND THE INSTRUCTIONS

## 2024-04-02 NOTE — Progress Notes (Signed)
 Follow up call to discuss chest CT results from 04/09/24.  Pain/cough/SOB?  IMPRESSION: 1. Bandlike consolidation in the right upper lobe with volume loss and adjacent fiducial in the airway, with increased consolidation lateral to the fiducial compared to prior study ( adjacent previously aerated secondary pulmonary lobules currently appear consolidated). This may well be inflammatory but does represent a change from prior exams, and merits careful surveillance 2. New 5 x 4 mm ground-glass nodule in the left lower lobe, surveillance in the context of the patients anticipated oncology imaging recommended. 3. Branching nodularity in the right middle lobe likely related to airway plugging. Survey on suggestive 4. Emphysema and atherosclerosis . 5. Multilevel bridging thoracic osteophytes.

## 2024-04-09 ENCOUNTER — Ambulatory Visit (HOSPITAL_COMMUNITY)
Admission: RE | Admit: 2024-04-09 | Discharge: 2024-04-09 | Disposition: A | Discharge status: Home / Self Care | Source: Ambulatory Visit | Attending: Urology | Admitting: Urology

## 2024-04-09 DIAGNOSIS — C3490 Malignant neoplasm of unspecified part of unspecified bronchus or lung: Secondary | ICD-10-CM | POA: Diagnosis not present

## 2024-04-09 DIAGNOSIS — J181 Lobar pneumonia, unspecified organism: Secondary | ICD-10-CM | POA: Diagnosis not present

## 2024-04-09 DIAGNOSIS — R911 Solitary pulmonary nodule: Secondary | ICD-10-CM | POA: Diagnosis not present

## 2024-04-09 DIAGNOSIS — C3411 Malignant neoplasm of upper lobe, right bronchus or lung: Secondary | ICD-10-CM | POA: Insufficient documentation

## 2024-04-09 MED ORDER — IOHEXOL 300 MG/ML  SOLN
75.0000 mL | Freq: Once | INTRAMUSCULAR | Status: AC | PRN
  Administered 2024-04-09: 75 mL via INTRAVENOUS

## 2024-04-09 MED ORDER — SODIUM CHLORIDE (PF) 0.9 % IJ SOLN
INTRAMUSCULAR | Status: AC
Start: 2024-04-09 — End: 2024-04-09
  Filled 2024-04-09: qty 50

## 2024-04-14 ENCOUNTER — Ambulatory Visit: Admission: RE | Admit: 2024-04-14 | Discharge: 2024-04-14 | Attending: Urology | Admitting: Urology

## 2024-04-14 ENCOUNTER — Encounter: Payer: Self-pay | Admitting: Urology

## 2024-04-14 DIAGNOSIS — C61 Malignant neoplasm of prostate: Secondary | ICD-10-CM | POA: Diagnosis not present

## 2024-04-14 DIAGNOSIS — C3411 Malignant neoplasm of upper lobe, right bronchus or lung: Secondary | ICD-10-CM

## 2024-04-14 DIAGNOSIS — Z191 Hormone sensitive malignancy status: Secondary | ICD-10-CM | POA: Diagnosis not present

## 2024-04-14 NOTE — Progress Notes (Signed)
 Radiation Oncology         [phone] ________________________________  Name: Glen [name] MRN: [id number]  Date: 04/14/2024  DOB: 06/05/1940  Post Treatment Note  CC: Regino Slater, MD  Regino Slater, MD  Diagnosis:    83 year old male treated for putative, Stage IA, NSCLC in the RUL lung  Interval Since Last Radiation:  2.5 years  10/10/21 - 10/16/21: SBRT//  The target in the RUL lung was treated to 54 Gy in 3 fractions of 18 Gy  Narrative:  I spoke with the patient to conduct his routine scheduled follow up visit via telephone to review his recent post-treatment CT Chest results to spare the patient unnecessary potential exposure in the healthcare setting during the current COVID-19 pandemic.  The patient was notified in advance and gave permission to proceed with this visit format.  He tolerated radiation treatment relatively well without any ill side effects aside from mild fatigue and mild dysphagia.                              On review of systems, the patient states that he is doing well in general.  He reports that the dysphagia has resolved completely and his energy level is gradually improving.  He has persistent, shortness of breath with exertion and a non-productive cough (clear/white sputum in am) but denies chest pain, fever, chills or night sweats. The shortness of breath is not worsening at this point. He has continued using his inhalers for COPD and has noted some improvement in his breathing since switching to Breztri  and he feels well otherwise. He has been very busy recently helping close his sister's estate. He is still recovering from a bout of bronchitis that was treated in 02/2024.  His post-treatment Chest CT from 03/14/22 showed findings consistent with developing radiation pneumonitis and fibrosis but the treated nodule in the RUL appeared stable.  There was a new subpleural nodule of the medial right lower lobe measuring 1.4 x 0.8 cm that was nonspecific and  felt most likely infectious or inflammatory, particularly in light of the rapid development as well as additional new more clearly infectious/inflammatory nodularity seen in the left lung base.  Therefore, he completed a course of antibiotics and prednisone  but unfortunately, did not notice any significant improvement in his breathing although the productive cough did resolve. We obtained a short interval follow up with repeat CT Chest on 05/28/22 and this showed the post radiation fibrosis in the right upper lobe was similar to that seen on the prior scan although it had become somewhat more confluent inferiorly with more prominent volume loss. The 9 x 4 mm right upper lobe pulmonary nodule measured previously was no longer discretely evident in the background scarring.  There was interval resolution of the right lower lobe subpleural nodule and clustered tree-in-bud nodularity seen previously in the left lower lobe with no new suspicious pulmonary nodule or mass.  Follow up CT Chest scans have continued to show a stable appearance without evidence of local recurrence or metastatic disease and stable post radiation changes in the right upper lobe lung. We reviewed the results of his most recent CT Chest from 04/09/24 today by telephone and this again overall, remains stable.  There is a new 5 x 4 mm ground glass nodule in the LLL lung that warrants continued surveillance imaging.    ALLERGIES:  is allergic to aspirin, lisinopril, nsaids, and other.  Meds:  Current Outpatient Medications  Medication Sig Dispense Refill   acetaminophen  (TYLENOL ) 650 MG CR tablet Take 1,300 mg by mouth at bedtime. Will take in am prn as well     albuterol  (PROAIR  HFA) 108 (90 Base) MCG/ACT inhaler Inhale 2 puffs into the lungs every 6 (six) hours as needed for wheezing or shortness of breath. Inhale 2 puffs every 6 hours as needed 17 each 5   ALPRAZolam  (XANAX ) 0.25 MG tablet Take 0.25 mg by mouth daily as needed for anxiety.       amLODipine  (NORVASC ) 10 MG tablet Take 10 mg by mouth daily.     aspirin EC 81 MG tablet 1 tablet     atorvastatin  (LIPITOR) 40 MG tablet Take 40 mg by mouth at bedtime.      budesonide-glycopyrrolate -formoterol (BREZTRI  AEROSPHERE) 160-9-4.8 MCG/ACT AERO inhaler Inhale 2 puffs into the lungs in the morning and at bedtime. 10.7 g 11   cetirizine (ZYRTEC) 10 MG chewable tablet Chew 10 mg by mouth daily.     Cyanocobalamin (VITAMIN B12) 1000 MCG TBCR 1 tablet     doxycycline  (VIBRA -TABS) 100 MG tablet Take 1 tablet (100 mg total) by mouth 2 (two) times daily. 14 tablet 0   ezetimibe  (ZETIA ) 10 MG tablet Take 1 tablet (10 mg total) by mouth daily. (Patient not taking: Reported on 03/02/2024) 15 tablet 0   ferrous sulfate 325 (65 FE) MG tablet Take 325 mg by mouth daily.     fluticasone  (FLONASE) 50 MCG/ACT nasal spray Place 2 sprays into both nostrils daily as needed for allergies.     gabapentin  (NEURONTIN ) 100 MG capsule Take 1 capsule (100 mg total) by mouth every morning. 90 capsule 3   gabapentin  (NEURONTIN ) 300 MG capsule TAKE 2 CAPSULES(600 MG) BY MOUTH AT BEDTIME 180 capsule 0   hydrochlorothiazide  (MICROZIDE ) 12.5 MG capsule Take 12.5 mg by mouth daily.      levalbuterol  (XOPENEX ) 0.63 MG/3ML nebulizer solution Use 1 vial via nebulizer every 4 hours as needed for wheezing or shortness of breath. 425 mL 3   mupirocin ointment (BACTROBAN) 2 % Apply 1 Application topically 2 (two) times daily.     omeprazole  (PRILOSEC) 20 MG capsule Take 20 mg by mouth daily.     Polyethylene Glycol 3350  (MIRALAX PO) Take 17 g by mouth daily.     predniSONE  (DELTASONE ) 10 MG tablet Prednisone  taper; 10 mg tablets: 4 tabs x 2 days, 3 tabs x 2 days, 2 tabs x 2 days 1 tab x 2 days then stop. 20 tablet 0   primidone  (MYSOLINE ) 50 MG tablet Take 2 tablets (100 mg total) by mouth 2 (two) times daily. 360 tablet 3   sertraline  (ZOLOFT ) 100 MG tablet Take 100 mg by mouth daily.   4   telmisartan  (MICARDIS ) 40 MG  tablet Take 40 mg by mouth daily.     No current facility-administered medications for this encounter.    Physical Findings:  vitals were not taken for this visit.  Pain Assessment Pain Score: 0-No pain/10 Unable to assess due to telephone follow-up visit format.  Lab Findings: Lab Results  Component Value Date   WBC 5.4 07/25/2018   HGB 8.9 (L) 07/25/2018   HCT 27.7 (L) 07/25/2018   MCV 93.3 07/25/2018   PLT 245 07/25/2018     Radiographic Findings: CT Chest W Contrast Result Date: 04/13/2024 EXAM: CT CHEST WITH CONTRAST 04/09/2024 10:49:10 AM TECHNIQUE: CT of the chest was performed with the administration of 75 mL of  iohexol  (OMNIPAQUE ) 300 MG/ML solution. Multiplanar reformatted images are provided for review. Automated exposure control, iterative reconstruction, and/or weight based adjustment of the mA/kV was utilized to reduce the radiation dose to as low as reasonably achievable. COMPARISON: None available. CLINICAL HISTORY: Non-small cell lung cancer (NSCLC), monitor; disease restaging for NSCLC RUL treated with SBRT in 2023. * Tracking Code: BO * FINDINGS: MEDIASTINUM: Heart and pericardium are unremarkable. The central airways are clear. Thoracic aortic, coronary artery, and branch vessel atheromatous vascular calcifications. Atheromatous plaque in the superior mesenteric artery and at the origin of the celiac trunk. LYMPH NODES: No mediastinal, hilar or axillary lymphadenopathy. LUNGS AND PLEURA: Emphysema. Mild tree in bed reticulonodular opacity especially early in the right upper lobe appear unchanged on image 37 series 5. Bandlike consolidation in the right upper lobe with volume loss just above the minor fissure with adjacent fiducial in the airway on image 58 series 5. Compared to previous at the level of the fiducial there is some increased consolidation lateral to the fiducial on image 58 series 5, previously there was a 1.6 cm segment of aerated lung which now appears  consolidated. Branching nodularity inferiorly in the right middle lobe for example on image 108 series 5 is probably from airway plugging but merit surveillance. Mildly increased presumed atelectasis along the right hemidiaphragm posteriorly with some associated adjacent subtending airway plugging in the right lower lobe. Parenchymal newly appreciable 5 x 4 mm ground glass density left lower lobe nodule on image 113 series 5, merit surveillance. Other tiny nodules in the 2 to 3 mm range are generally similar to prior. No pleural effusion or pneumothorax. SOFT TISSUES/BONES: Multilevel bridging thoracic osteophytes. UPPER ABDOMEN: Limited images of the upper abdomen demonstrates no acute abnormality. Atheromatous plaque in the superior mesenteric artery and at the origin of the celiac trunk. IMPRESSION: 1. Bandlike consolidation in the right upper lobe with volume loss and adjacent fiducial in the airway, with increased consolidation lateral to the fiducial compared to prior study ( adjacent previously aerated secondary pulmonary lobules currently appear consolidated). This may well be inflammatory but does represent a change from prior exams, and merits careful surveillance 2. New 5 x 4 mm ground-glass nodule in the left lower lobe, surveillance in the context of the patients anticipated oncology imaging recommended. 3. Branching nodularity in the right middle lobe likely related to airway plugging. Survey on suggestive 4. Emphysema and atherosclerosis . 5. Multilevel bridging thoracic osteophytes. Electronically signed by: Ryan Salvage MD 04/13/2024 09:37 AM EST RP Workstation: [id number]    Impression/Plan: 65.  83 year old male putative, Stage IA, NSCLC in the RUL lung. He appears to have recovered well from the effects of the stereotactic body radiotherapy (SBRT) and has noticed some improvement in his breathing with the use of his new inhalers.  His recent follow up CT Chest shows a stable appearance  without evidence of local recurrence or disease progression.  Therefore, we will continue with serial CT Chest scans every 6 months going forward, to continue to monitor for any evidence of disease recurrence or progression and will follow-up by phone to review the results and any recommendations following each scan.  Once we reach 5 years without evidence of disease recurrence, we can then moved to annual chest CT scans for surveillance.  He appears to have a good understanding of these recommendations and is comfortable and in agreement with the stated plan.  He knows that he is welcome to call anytime in the interim with any questions  or concerns related to his radiation.  He will continue follow up in pulmonology for management of his COPD and will also continue in routine follow-up under the care and direction of Dr. Cloretta for continued surveillance of his colon cancer which remains in remission.  I personally spent 20 minutes in this encounter including chart review, reviewing radiological studies, meeting face-to-face with the patient, entering orders and completing documentation.     Sabra MICAEL Rusk, PA-C

## 2024-05-23 ENCOUNTER — Other Ambulatory Visit: Payer: Self-pay | Admitting: Neurology

## 2024-05-23 DIAGNOSIS — G609 Hereditary and idiopathic neuropathy, unspecified: Secondary | ICD-10-CM

## 2024-05-23 DIAGNOSIS — G250 Essential tremor: Secondary | ICD-10-CM

## 2024-05-26 ENCOUNTER — Other Ambulatory Visit: Payer: Self-pay | Admitting: Neurology

## 2024-05-26 DIAGNOSIS — G250 Essential tremor: Secondary | ICD-10-CM

## 2024-05-26 DIAGNOSIS — G609 Hereditary and idiopathic neuropathy, unspecified: Secondary | ICD-10-CM

## 2024-09-02 ENCOUNTER — Ambulatory Visit (HOSPITAL_COMMUNITY)

## 2024-09-02 ENCOUNTER — Ambulatory Visit

## 2024-10-20 ENCOUNTER — Ambulatory Visit: Admitting: Urology

## 2025-03-02 ENCOUNTER — Ambulatory Visit: Admitting: Neurology
